# Patient Record
Sex: Female | Born: 1945
Health system: Southern US, Community
[De-identification: ages and names within clinical notes are randomized; demographics above are authoritative.]

## PROBLEM LIST (undated history)

## (undated) DIAGNOSIS — F329 Major depressive disorder, single episode, unspecified: Secondary | ICD-10-CM

## (undated) DIAGNOSIS — R0602 Shortness of breath: Secondary | ICD-10-CM

## (undated) DIAGNOSIS — K259 Gastric ulcer, unspecified as acute or chronic, without hemorrhage or perforation: Secondary | ICD-10-CM

## (undated) DIAGNOSIS — Z9889 Other specified postprocedural states: Secondary | ICD-10-CM

## (undated) DIAGNOSIS — Z8489 Family history of other specified conditions: Secondary | ICD-10-CM

## (undated) DIAGNOSIS — R51 Headache: Secondary | ICD-10-CM

## (undated) DIAGNOSIS — F419 Anxiety disorder, unspecified: Secondary | ICD-10-CM

## (undated) DIAGNOSIS — E7800 Pure hypercholesterolemia, unspecified: Secondary | ICD-10-CM

## (undated) DIAGNOSIS — M1990 Unspecified osteoarthritis, unspecified site: Secondary | ICD-10-CM

## (undated) DIAGNOSIS — J449 Chronic obstructive pulmonary disease, unspecified: Secondary | ICD-10-CM

## (undated) DIAGNOSIS — T4145XA Adverse effect of unspecified anesthetic, initial encounter: Secondary | ICD-10-CM

## (undated) DIAGNOSIS — J189 Pneumonia, unspecified organism: Secondary | ICD-10-CM

## (undated) DIAGNOSIS — B977 Papillomavirus as the cause of diseases classified elsewhere: Secondary | ICD-10-CM

## (undated) DIAGNOSIS — K219 Gastro-esophageal reflux disease without esophagitis: Secondary | ICD-10-CM

## (undated) DIAGNOSIS — T8859XA Other complications of anesthesia, initial encounter: Secondary | ICD-10-CM

## (undated) DIAGNOSIS — I209 Angina pectoris, unspecified: Secondary | ICD-10-CM

## (undated) DIAGNOSIS — K589 Irritable bowel syndrome without diarrhea: Secondary | ICD-10-CM

## (undated) DIAGNOSIS — F32A Depression, unspecified: Secondary | ICD-10-CM

## (undated) DIAGNOSIS — I10 Essential (primary) hypertension: Secondary | ICD-10-CM

## (undated) DIAGNOSIS — R112 Nausea with vomiting, unspecified: Secondary | ICD-10-CM

## (undated) HISTORY — DX: Pure hypercholesterolemia, unspecified: E78.00

## (undated) HISTORY — PX: APPENDECTOMY: SHX54

## (undated) HISTORY — DX: Other specified postprocedural states: Z98.890

## (undated) HISTORY — PX: CARPAL TUNNEL RELEASE: SHX101

## (undated) HISTORY — DX: Depression, unspecified: F32.A

## (undated) HISTORY — PX: BACK SURGERY: SHX140

## (undated) HISTORY — PX: BREAST SURGERY: SHX581

## (undated) HISTORY — DX: Anxiety disorder, unspecified: F41.9

## (undated) HISTORY — DX: Essential (primary) hypertension: I10

## (undated) HISTORY — DX: Major depressive disorder, single episode, unspecified: F32.9

## (undated) HISTORY — DX: Gastric ulcer, unspecified as acute or chronic, without hemorrhage or perforation: K25.9

## (undated) HISTORY — DX: Gastro-esophageal reflux disease without esophagitis: K21.9

## (undated) HISTORY — PX: OTHER SURGICAL HISTORY: SHX169

---

## 1974-10-03 HISTORY — PX: TUBAL LIGATION: SHX77

## 1982-10-03 HISTORY — PX: CHOLECYSTECTOMY: SHX55

## 2001-06-06 ENCOUNTER — Other Ambulatory Visit: Admission: RE | Admit: 2001-06-06 | Discharge: 2001-06-06 | Payer: Self-pay | Admitting: Family Medicine

## 2003-10-03 ENCOUNTER — Emergency Department (HOSPITAL_COMMUNITY): Admission: EM | Admit: 2003-10-03 | Discharge: 2003-10-03 | Payer: Self-pay | Admitting: Emergency Medicine

## 2004-01-07 ENCOUNTER — Ambulatory Visit (HOSPITAL_COMMUNITY): Admission: RE | Admit: 2004-01-07 | Discharge: 2004-01-07 | Payer: Self-pay | Admitting: Family Medicine

## 2005-03-24 ENCOUNTER — Other Ambulatory Visit: Admission: RE | Admit: 2005-03-24 | Discharge: 2005-03-24 | Payer: Self-pay | Admitting: Family Medicine

## 2005-03-30 ENCOUNTER — Ambulatory Visit (HOSPITAL_COMMUNITY): Admission: RE | Admit: 2005-03-30 | Discharge: 2005-03-30 | Payer: Self-pay | Admitting: Family Medicine

## 2005-07-24 ENCOUNTER — Inpatient Hospital Stay (HOSPITAL_COMMUNITY): Admission: EM | Admit: 2005-07-24 | Discharge: 2005-07-28 | Payer: Self-pay | Admitting: Emergency Medicine

## 2005-10-03 DIAGNOSIS — J189 Pneumonia, unspecified organism: Secondary | ICD-10-CM

## 2005-10-03 HISTORY — DX: Pneumonia, unspecified organism: J18.9

## 2006-04-25 ENCOUNTER — Encounter: Admission: RE | Admit: 2006-04-25 | Discharge: 2006-04-25 | Payer: Self-pay | Admitting: Family Medicine

## 2007-10-04 HISTORY — PX: SPINE SURGERY: SHX786

## 2008-01-07 ENCOUNTER — Ambulatory Visit: Payer: Self-pay | Admitting: Gastroenterology

## 2008-01-07 DIAGNOSIS — R1310 Dysphagia, unspecified: Secondary | ICD-10-CM | POA: Insufficient documentation

## 2008-01-21 ENCOUNTER — Encounter: Payer: Self-pay | Admitting: Gastroenterology

## 2008-01-21 ENCOUNTER — Ambulatory Visit: Payer: Self-pay | Admitting: Gastroenterology

## 2008-01-28 ENCOUNTER — Ambulatory Visit: Payer: Self-pay | Admitting: Gastroenterology

## 2008-04-28 ENCOUNTER — Telehealth: Payer: Self-pay | Admitting: Gastroenterology

## 2008-05-05 ENCOUNTER — Ambulatory Visit: Payer: Self-pay | Admitting: Gastroenterology

## 2008-05-15 ENCOUNTER — Ambulatory Visit: Payer: Self-pay | Admitting: Gastroenterology

## 2008-05-15 DIAGNOSIS — Z9889 Other specified postprocedural states: Secondary | ICD-10-CM

## 2008-05-15 HISTORY — DX: Other specified postprocedural states: Z98.890

## 2008-05-30 ENCOUNTER — Ambulatory Visit: Payer: Self-pay | Admitting: Gastroenterology

## 2008-05-30 DIAGNOSIS — Z9889 Other specified postprocedural states: Secondary | ICD-10-CM

## 2008-05-30 HISTORY — DX: Other specified postprocedural states: Z98.890

## 2008-06-25 ENCOUNTER — Ambulatory Visit (HOSPITAL_COMMUNITY): Admission: RE | Admit: 2008-06-25 | Discharge: 2008-06-25 | Payer: Self-pay | Admitting: Neurosurgery

## 2008-07-23 ENCOUNTER — Ambulatory Visit (HOSPITAL_COMMUNITY): Admission: RE | Admit: 2008-07-23 | Discharge: 2008-07-24 | Payer: Self-pay | Admitting: Neurosurgery

## 2008-09-04 ENCOUNTER — Observation Stay (HOSPITAL_COMMUNITY): Admission: AD | Admit: 2008-09-04 | Discharge: 2008-09-05 | Payer: Self-pay | Admitting: Neurosurgery

## 2008-11-03 ENCOUNTER — Ambulatory Visit (HOSPITAL_COMMUNITY): Admission: RE | Admit: 2008-11-03 | Discharge: 2008-11-03 | Payer: Self-pay | Admitting: Neurosurgery

## 2008-12-22 ENCOUNTER — Ambulatory Visit (HOSPITAL_COMMUNITY): Admission: RE | Admit: 2008-12-22 | Discharge: 2008-12-22 | Payer: Self-pay | Admitting: Family Medicine

## 2011-01-17 LAB — CBC
HCT: 41 % (ref 36.0–46.0)
Hemoglobin: 13.6 g/dL (ref 12.0–15.0)
MCHC: 33.3 g/dL (ref 30.0–36.0)
MCV: 83.7 fL (ref 78.0–100.0)
Platelets: 252 10*3/uL (ref 150–400)
RBC: 4.9 MIL/uL (ref 3.87–5.11)
RDW: 14.5 % (ref 11.5–15.5)
WBC: 10 10*3/uL (ref 4.0–10.5)

## 2011-01-17 LAB — BASIC METABOLIC PANEL
BUN: 5 mg/dL — ABNORMAL LOW (ref 6–23)
CO2: 29 mEq/L (ref 19–32)
Calcium: 9.8 mg/dL (ref 8.4–10.5)
Chloride: 102 mEq/L (ref 96–112)
Creatinine, Ser: 0.6 mg/dL (ref 0.4–1.2)
GFR calc Af Amer: >60 mL/min (ref >60)
GFR calc non Af Amer: >60 mL/min (ref >60)
Glucose, Bld: 95 mg/dL (ref 70–99)
Potassium: 4.2 mEq/L (ref 3.5–5.1)
Sodium: 136 mEq/L (ref 135–145)

## 2011-02-01 HISTORY — PX: CERVICAL BIOPSY  W/ LOOP ELECTRODE EXCISION: SUR135

## 2011-02-03 ENCOUNTER — Ambulatory Visit (INDEPENDENT_AMBULATORY_CARE_PROVIDER_SITE_OTHER): Payer: Medicare Other | Admitting: Gynecology

## 2011-02-11 ENCOUNTER — Other Ambulatory Visit: Payer: Self-pay | Admitting: Gynecology

## 2011-02-11 ENCOUNTER — Ambulatory Visit (INDEPENDENT_AMBULATORY_CARE_PROVIDER_SITE_OTHER): Payer: Medicare Other | Admitting: Gynecology

## 2011-02-11 DIAGNOSIS — N871 Moderate cervical dysplasia: Secondary | ICD-10-CM

## 2011-02-15 NOTE — Op Note (Signed)
[name], LANTIS            ACCOUNT NO.:  [id number]   MEDICAL RECORD NO.:  [id number]          PATIENT TYPE:  AMB   LOCATION:  SDS                          FACILITY:  [facility]   PHYSICIAN:  Cristi Loron, M.D.DATE OF BIRTH:  Sep 07, 1946   DATE OF PROCEDURE:  11/03/2008  DATE OF DISCHARGE:  11/03/2008                               OPERATIVE REPORT   BRIEF HISTORY:  The patient is a white female who has suffered from  right hand pain and numbness consistent with carpal tunnel syndrome.  She has had MCVs which confirmed this diagnosis.  She failed medical  management and I discussed various treatment options with her including  a right carpal tunnel release.  The patient weighed the risks, benefits,  and alternatives of the surgery and desired to proceed with the  operation.   PREOPERATIVE DIAGNOSIS:  Bilateral carpal tunnel syndrome.   POSTOPERATIVE DIAGNOSIS:  Bilateral carpal tunnel syndrome.   PROCEDURE:  Right median nerve neurolysis at the wrist (carpal tunnel  release).   SURGEON:  Cristi Loron, MD   ASSISTANT:  None.   ANESTHESIA:  MAC.   ESTIMATED BLOOD LOSS:  Minimal.   SPECIMENS:  None.   DRAINS:  None.   COMPLICATIONS:  None.   DESCRIPTION OF PROCEDURE:  The patient was brought to the operating room  by the Anesthesia team.  MAC anesthesia was induced.  The patient  remained in supine position.  The right upper extremity was then  prepared with Betadine scrub and Betadine solution.  Sterile drapes were  applied.  I then injected the area to be incised with Marcaine solution.  I then made and an incision along the patient's right palmar crease.  I  used a Horticulturist, commercial for exposure exposing the superficial fascia.  I then divided this with a 15-blade scalpel exposing the transverse  carpal ligament.  I divided this again with a 15-blade scalpel and  identified the underlying median nerve.  I then divided the transverse  carpal ligament  both proximally and distally using the Metzenbaum  scissors staying along the palmar aspect of the median nerve.  I got  release of the ligament both proximally and distally and the nerve is  well decompressed.  We obtained hemostasis using bipolar electrocautery.  We then reapproximated the patient's subcutaneous tissue with  interrupted 3-0 Vicryl suture.  The skin with a running of 3-0 nylon  suture.  The wound  was then coated with bacitracin ointment.  A sterile dressing applied.  The drapes were removed and the patient was subsequently transported to  postanesthesia care unit in stable condition.  All sponge, instrument,  and needle counts were correct at the end of this case.      Cristi Loron, M.D.  Electronically Signed     JDJ/MEDQ  D:  11/03/2008  T:  11/04/2008  Job:  [id number]

## 2011-02-15 NOTE — Op Note (Signed)
[name], [name] NO.:  [id number]   MEDICAL RECORD NO.:  [id number]          PATIENT TYPE:  INP   LOCATION:  3036                         FACILITY:  [facility]   PHYSICIAN:  Cristi Loron, M.D.DATE OF BIRTH:  Sep 23, 1946   DATE OF PROCEDURE:  09/04/2008  DATE OF DISCHARGE:                               OPERATIVE REPORT   BRIEF HISTORY:  The patient is a 65 year old white female who has  suffered from back and leg pain consistent with neurogenic claudication.  She was worked up with a lumbar MRI which demonstrated that the patient  had spinal stenosis at L2-3, L3-4 and L4-5.  I discussed the various  treatment options with the patient and her husband including surgery.  She has weighed the risks, benefits and alternatives of surgery and  decided to proceed with a decompressive laminectomy.   PREOPERATIVE DIAGNOSES:  L2-3, L3-4 and L4-5 spinal stenosis, lumbago,  lumbar radiculopathy.   POSTOPERATIVE DIAGNOSES:  L2-3, L3-4 and L4-5 spinal stenosis, lumbago,  lumbar radiculopathy.   PROCEDURE:  Bilateral L2, L3 and L4 laminotomies, foraminotomies to  decompress the bilateral L3, L4 and L5 nerve roots using  microdissection.   SURGEON:  Cristi Loron, MD   ASSISTANT:  Clydene Fake, MD   ANESTHESIA:  General endotracheal.   ESTIMATED BLOOD LOSS:  100 mL.   SPECIMENS:  None.   DRAINS:  None.   COMPLICATIONS:  None.   DESCRIPTION OF PROCEDURE:  The patient was brought to the operating room  by the anesthesia team and general endotracheal anesthesia was induced.  The patient was turned to the prone position on the Wilson frame.  Lumbosacral region was then prepared with Betadine scrub and Betadine  solution.  I have reviewed the patient's preoperative films and noted  that she had stenosis at L2-3, L3-4 and L4-5.  I therefore thought it  best to do a decompressive laminotomies at L2-3, L3-4 and L4-5 and I  announced to the OR team prior to  beginning the operation, i.e. prior to  the incision that I was going to include L4-5 in the surgery as well to  avoid any misconceptions.  I then injected the area to be incised with  Marcaine with epinephrine solution and used the scalpel to make a linear  midline incision over the L2-3, L3-4 and L4-5 interspaces.  I used  electrocautery to perform a bilateral subperiosteal dissection exposing  the spinous process lamina of L2, L3, L4 and L5.  We obtained  intraoperative radiograph to confirm our location.  I inserted the  cerebellar and McCullough retractor for exposure.  I began the  decompression by performing bilateral L2, L3 and L4 laminotomies with a  high-speed drill.  I then brought the operative microscope into the  field and under its magnification and illumination, I completed the  microdissection/decompression.  I used a Kerrison punch to widen the  laminotomies at L2, L3 and L4 and removed the L2-3, L3-4 and L4-5  ligamentum flavum.  I then performed foraminotomies about bilateral L3,  L4 and L5 nerve roots.  I used the angled curettes to  remove some excess  ligamentum flavum from lateral recesses.  I then palpated along the  ventral surface of the thecal sac and along the exit route of the  bilateral L3, L4, and L5 nerve roots and noted that they are well  decompressed.  We inspected the intervertebral disk at L2-3, L3-4 and L4-  5 bilaterally.  The disk was mildly bulging but there was no disk  herniations.  At this point, we were satisfied with decompression.  We  obtained hemostasis using bipolar electrocautery, irrigated the wound  out with bacitracin solution, then removed the retractors and then  reapproximated the patient's thoracolumbar fascia with interrupted #1  Vicryl suture, subcutaneous with interrupted 2-0 Vicryl suture and the  skin with Steri-Strips and benzoin.  The wound was then coated with  bacitracin ointment.  A sterile dressing was applied.  The  drapes were  removed.  The patient was subsequently returned to supine position where  she was extubated by the anesthesia team and transported to the  postanesthesia care unit in stable condition.  All sponge, instrument  and needle counts were correct at the end of this case.      Cristi Loron, M.D.  Electronically Signed     JDJ/MEDQ  D:  09/04/2008  T:  09/05/2008  Job:  [id number]

## 2011-02-15 NOTE — Letter (Signed)
January 07, 2008    Belva Agee, [place]  [address] [place], [place] [address]   RE:  ZAYLEI, MULLANE  MRN:  [id number]  /  DOB:  27-May-1946   Dear Darl Pikes:   Upon your kind referral, I had the pleasure of evaluating your patient  and I am pleased to offer my findings.  I saw Ms. Shrum in the office  today.  Enclosed is a copy of my progress note that details my findings  and recommendations.   Thank you for the opportunity to participate in your patient's care.    Sincerely,      Barbette Hair. Arlyce Dice, MD,FACG  Electronically Signed    RDK/MedQ  DD: 01/07/2008  DT: 01/07/2008  Job #: [id number]

## 2011-02-15 NOTE — Letter (Signed)
January 07, 2008    Tilford Pillar   RE:  [name], Sharon  MRN:  [id number]  /  DOB:  Jun 06, 1946   Dear Ms. Odle:   It is my pleasure to have treated you recently as a new patient in my  office.  I appreciate your confidence and the opportunity to participate  in your care.   Since I do have a busy inpatient endoscopy schedule and office schedule,  my office hours vary weekly.  I am, however, available for emergency  calls every day through my office.  If I cannot promptly meet an urgent  office appointment, another one of our gastroenterologists will be able  to assist you.   My well-trained staff are prepared to help you at all times.  For  emergencies after office hours, a physician from our gastroenterology  section is always available through my 24-hour answering service.   While you are under my care, I encourage discussion of your questions  and concerns, and I will be happy to return your calls as soon as I am  available.   Once again, I welcome you as a new patient and I look forward to a happy  and healthy relationship.    Sincerely,      Barbette Hair. Arlyce Dice, MD,FACG  Electronically Signed   RDK/MedQ  DD: 01/07/2008  DT: 01/07/2008  Job #: [id number]

## 2011-02-15 NOTE — Op Note (Signed)
[name], [name]            ACCOUNT NO.:  [id number]   MEDICAL RECORD NO.:  [id number]          PATIENT TYPE:  OIB   LOCATION:  3526                         FACILITY:  [facility]   PHYSICIAN:  Cristi Loron, M.D.DATE OF BIRTH:  [birth year]   DATE OF PROCEDURE:  07/23/2008  DATE OF DISCHARGE:                               OPERATIVE REPORT   BRIEF HISTORY:  The patient is a 65 year old white female who suffered  from neck and arm pain consistent with a C6 radiculopathy.  She failed  medical management and was worked up with a cervical MRI, which  demonstrated she had C5-6 disk degeneration, spondylosis, and stenosis,  etc.  I discussed the various treatment options with the patient and her  husband including surgery.  The patient has weighed the risks, benefits,  benefits, and alternatives of surgery, and decided to proceed with C5-6  anterior cervical diskectomy fusion and plating.   PREOPERATIVE DIAGNOSES:  C5-6 degenerative disk disease, spondylosis,  stenosis, cervical radiculitis/myelopathy, and cervicalgia.   POSTOPERATIVE DIAGNOSIS:  C5-6 degenerative disk disease, spondylosis,  stenosis, cervical radiculitis/myelopathy, and cervicalgia.   PROCEDURE:  C5-6 extensive anterior cervical diskectomy/decompression;  C5-6 anterior interbody arthrodesis with local morselized autograft bone  and Actifuse bone graft extender; insertion of C5-6 interbody prosthesis  (Alphatec PEEK interbody prosthesis); C5-6 anterior cervical plating  (Codman Slim-Loc titanium plate and screws).   SURGEON:  Cristi Loron, MD   ASSISTANT:  Danae Orleans. Venetia Maxon, MD   ANESTHESIA:  General endotracheal.   ESTIMATED BLOOD LOSS:  50 mL.   SPECIMENS:  None.   DRAINS:  None.   COMPLICATIONS:  None.   DESCRIPTION OF PROCEDURE:  The patient was brought to the operating room  by anesthesia team.  General endotracheal anesthesia was induced.  The  patient remained in supine position.  A roll was  placed under her  shoulders and placed her neck in slight extension.  The anterior  cervical region was then prepared with Betadine scrub and Betadine  solution.  Sterile drapes were applied.  I then injected the area to be  incised with Marcaine with epinephrine solution.  I used a scalpel to  make a transverse incision in the patient's left anterior neck.  I used  a Metzenbaum scissors to divide the platysma muscle and then to dissect  medial to sternocleidomastoid muscle, jugular vein, and carotid artery.  I carefully dissected down towards the anterior cervical spine  identifying the esophagus and retract it medially.  We then used skin  swabs to clear soft tissue from the anterior cervical spine, and then we  inserted a bent spinal needle into the upper exposed intervertebral  space.  We obtained the intraoperative radiograph to confirm our  location.   We then used electrocautery to detach the medial border of the longus  colli muscle bilaterally from the C5-6 intervertebral disk space.  We  then inserted  the Caspar self-retaining retractor underneath the longus  colli muscle bilaterally to provide exposure.  We began the  decompression by incising C5-6 intervertebral disk with a 15-blade  scalpel.  We performed a partial intervertebral diskectomy  using  pituitary forceps and Carlens curettes.  We then inserted traction  screws to C5 and C6 and distracted the interspace.  We used a high-speed  drill to decorticate the vertebral endplates at C5-6, drilled away the  remainder of C5-6 intervertebral disk, thin out the posterior  longitudinal ligament, and drilled away some posterior spondylosis.  We  then incised the thinned out ligament with arachnoid knife and then  removed with a Kerrison punch undercutting the vertebral endplates  decompressing the thecal sac.  We then performed foraminotomies about of  about the bilateral C6 nerve root, completing the decompression.   We now  turned attention to the arthrodesis.  We used the trial spacers  and determined to use a 5-mm medium PEEK interbody prosthesis.  We  prefilled this prosthesis with combination of local autograft bone.  We  obtained total decompression as well as Actifuse bone graft extender.  We inserted this prosthesis into the distracted C5-6 interspace and then  removed the distraction screws.  There was a good snug fit of the  prosthesis.   We now turned attention to the anterior spinal instrumentation.  We used  a high-speed drill to drill away some ventral spondylosis from the  vertebral endplates at C5-6, so that the plate lay down flat.  We then  the appropriate-length Codman Slim-Loc anterior cervical plate and laid  it along the anterior aspect of the vertebral bodies at C5-C6.  We then  drilled two 12 mm holes at C5-C6 and then secured the plate to the  vertebral bodies by placing two 12-mm self-tapping screws at C5-C6.  We  then obtained intraoperative radiograph that demonstrating good position  of the plate, screws, and interbody prosthesis at C5-6.  We, therefore,  secured these screws and plate by locking each cam.   We then obtained hemostasis using bipolar electrocautery.  We irrigated  the wound out with bacitracin solution.  We then removed the retractor  and then we inspected esophagus for any damage; there was none apparent.  We then reapproximated the patient's platysma muscle with interrupted 3-  0 Vicryl suture, the subcutaneous tissue with interrupted 3-0 Vicryl  suture, and the skin with Steri-Strips and Benzoin.  The wound was then  coated with bacitracin ointment.  A sterile dressing was applied.  The  drapes were removed, and the patient was subsequently extubated by  anesthesia team and transported to the post anesthesia care unit in  stable condition.  All sponge, instrument, and needle counts were  correct at the end of this case.      Cristi Loron, M.D.   Electronically Signed     JDJ/MEDQ  D:  07/23/2008  T:  07/24/2008  Job:  [id number]

## 2011-02-15 NOTE — Assessment & Plan Note (Signed)
[facility]                         GASTROENTEROLOGY OFFICE NOTE   NAME:Routt, [name]                   MRN:          [id number]  DATE:01/07/2008                            DOB:          07/15/1946    PROBLEM:  Dysphagia.   The patient is a pleasant 65 year old white female referred through the  courtesy of Belva Agee, P.A. for evaluation.  For the last 4 months she  has been complaining of dysphagia to solids and liquids.  Her symptoms  are actually worse with liquids.  She denies pyrosis or odynophagia.  She does have intermittent pain the left and right upper quadrants and  in the chest.  With this pain, the discomfort may radiate to her back.  Weight has been stable.   PAST MEDICAL HISTORY:  Pertinent for hypertension and depression.  She  is status post cholecystectomy, appendectomy.   FAMILY HISTORY:  Pertinent for a brother with heart disease and mother  and brother with diabetes.   MEDICATIONS:  1. Hydrochlorothiazide.  2. Paroxetine.  3. Xanax.  4. Vitamin D.   ALLERGIES:  CODEINE.   SOCIAL HISTORY:  She smokes 1 to 1-1/2 packs a day.  She drank a pint of  alcohol about every other day, but quit in August 2008.  She is married  and works as a Scientist, product/process development.   REVIEW OF SYSTEMS:  Pertinent for joint pains and muscle cramps.   PHYSICAL EXAMINATION:  VITAL SIGNS:  Pulse 78, blood pressure 148/74,  weight 185.  HEENT: EOMI.  PERRLA.  Sclerae are anicteric.  Conjunctivae are pink.  NECK:  Supple without thyromegaly, adenopathy or carotid bruits.  CHEST:  Clear to auscultation and percussion without adventitious  sounds.  CARDIAC:  Regular rhythm; normal S1 S2.  There are no murmurs, gallops  or rubs.  ABDOMEN:  Bowel sounds are normoactive.  Abdomen is soft, nontender and  nondistended.  There are no abdominal masses, tenderness, splenic  enlargement or hepatomegaly.  EXTREMITIES:  Full range of motion.  No cyanosis,  clubbing or edema.  RECTAL:  Deferred.   IMPRESSION:  Dysphagia.  She likely has an esophageal stricture.  Peptic  stricture is certainly likely though she has risk factors for esophageal  cancer.   RECOMMENDATION:  1. Upper endoscopy.  2. Begin Aciphex 20 mg a day.     Barbette Hair. Arlyce Dice, MD,FACG  Electronically Signed    RDK/MedQ  DD: 01/07/2008  DT: 01/07/2008  Job #: [id number]   cc:   [facility] Belva Agee, [place]

## 2011-02-18 NOTE — H&P (Signed)
[name], [name] NO.:  [id number]   MEDICAL RECORD NO.:  [id number]          PATIENT TYPE:  EMS   LOCATION:  ED                            FACILITY:  [facility]   PHYSICIAN:  Michaelyn Barter, M.D. DATE OF BIRTH:  1946/03/28   DATE OF ADMISSION:  07/24/2005  DATE OF DISCHARGE:  LH                                HISTORY & PHYSICAL   PRIMARY CARE PHYSICIAN:  Magnus Sinning. Rice, M.D.   CHIEF COMPLAINT:  Shortness of breath.   HISTORY OF PRESENT ILLNESS:  [name] is a 65 year old female with a  past medical history of hypertension and depression who states that this  past Thursday she began to develop a cough.  She states that she bought some  over-the-counter cough medications and initially, it appeared to relieve her  symptoms.  However, since Thursday, she has developed tightness in her chest  which has progressed to the point that last night, she became short of  breath.  Yesterday, she also developed generalized body aches and pain and  states that she has been coughing a significant amount of time since her  symptoms initially started.  She complains of some subjective fevers, some  nausea, but denies emesis.  The cough is productive of scant amount of  phlegm and states that this morning, her voice decreased.   PAST MEDICAL HISTORY:  1.  Hypertension.  2.  Depression.   PAST SURGICAL HISTORY:  1.  Cholecystectomy.  2.  Appendectomy.  3.  Tumor removed from the right shoulder.  4.  Left breast biopsy.   ALLERGIES:  CODEINE causes her chest to hurt and emesis occurs.   HOME MEDICATIONS:  1.  Goody Powder on a p.r.n. basis.  2.  Hydrochlorothiazide 25 mg daily.  3.  Paxil 20 mg daily.   SOCIAL HISTORY:  The patient smokes one pack of cigarettes per day.  She  started at the age of 85.  Alcohol she drinks occasionally.   FAMILY HISTORY:  Mother had a history of emphysema, died of  heart disease.  Father:  History of hypertension and CHF.   REVIEW OF SYSTEMS:  As per HPI;  otherwise, all other systems are negative.   PHYSICAL EXAMINATION:  GENERAL:  The patient is awake.  She is cooperative.  She coughs numerous times during the examination. She shows no other obvious  signs of distress.  Her breathing at this particular time does not appear to  be labored;  however, she did receive a breathing treatment in addition to  other treatments prior to me seeing her.  VITAL SIGNS:  Temperature 99.2, blood pressure 136/56, heart rate 101,  respirations 24, O2 saturation 88% initially when the patient presented to  the emergency room.  HEENT:  Anicteric.  Extraocular movements are intact.  Pupils are equally  reactive to light.  Dentures are present.  There is no oral thrush.  No  exudates are present.  NECK: Supple, no lymphadenopathy. Thyroid is not palpable.  No JVD is  appreciated.  CARDIAC:  S1 and S2 is present, regular rate and rhythm.  No murmurs,  no  gallops, no rubs are auscultated.  RESPIRATORY:  Loud, coarse expiratory wheezes/rhonchi are heard.  ABDOMEN:  Soft, nontender, nondistended.  Bowel sounds are present.  EXTREMITIES:  No leg edema.  NEUROLOGIC:  The patient is alert and oriented x3.  Cranial nerves II-XII  are intact.  MUSCULOSKELETAL:  5/5 upper and lower extremity strength.   DATA:  ABG:  pH 7.510, PCO2 36, PO2 49.  Hemoglobin 13.6, hematocrit 40.  Sodium 132, potassium 3.1, chloride 97, CO2 134, BUN 5, creatinine 0.9,  glucose 134.  Chest x-ray:  Mild cardiomegaly, chronic bronchitic changes,  rounded density over the right apex.  CT of the chest:  Diffuse tree-in-bud  opacity in the right upper lung related to bronchopneumonia and/or atypical  infection.  Nodule at the right cardiophrenic angle is the fat pad.   ASSESSMENT/PLAN:  1.  Community acquired pneumonia.  Will start the patient empirically on IV      antibiotics consisting of ceftriaxone and azithromycin.  Will check      sputum for Gram  stain, culture and sensitivity.  Will also provide cough      suppressant and incentive spirometer for the patient.  2.  Respiratory distress.  This is most likely secondary to the community      acquired pneumonia that the patient is currently presenting with. Will      continue supplemental oxygen and nebulized breathing treatments.  Will      also continue steroids that were initiated in the emergency room.  3.  Hypertension.  This is currently stable.  Will resume the patient's      previously prescribed medication of hydrochlorothiazide.  4.  Depression.  Will resume the patient's previously prescribed medication      of Paxil.  5.  Gastrointestinal prophylaxis.  Will provide Protonix 40 mg daily.  6.  Deep venous thrombosis prophylaxis.  Will provide Lovenox 40 mg      subcutaneously daily.      Michaelyn Barter, M.D.  Electronically Signed     OR/MEDQ  D:  07/24/2005  T:  07/24/2005  Job:  [id number]   cc:   Magnus Sinning. Rice, M.D.  Fax: [phone]

## 2011-02-18 NOTE — Discharge Summary (Signed)
[name], [name]            ACCOUNT NO.:  [id number]   MEDICAL RECORD NO.:  [id number]          PATIENT TYPE:  INP   LOCATION:  A335                          FACILITY:  [facility]   PHYSICIAN:  Calvert Cantor, M.D.     DATE OF BIRTH:  30-Oct-1945   DATE OF ADMISSION:  07/24/2005  DATE OF DISCHARGE:  10/26/2006LH                                 DISCHARGE SUMMARY   PRIMARY CARE PHYSICIAN:  Magnus Sinning. Rice, M.D.   DISCHARGE DIAGNOSES:  1.  Acute right upper lobe pneumonia.  2.  Chronic obstructive pulmonary disease exacerbation.  3.  History of depression.  4.  Hypertension.   DISCHARGE MEDICATIONS:  1.  Prednisone taper.  2.  Combivent inhaler.  3.  Zithromax x2 more days.  4.  Hydrochlorothiazide 25 mg daily.  5.  Paxil 20 mg daily.   HOSPITAL COURSE:  This is a 65 year old, white female who was admitted with  cough and shortness of breath.  Chest x-ray showed a right upper lobe  infiltrate.  In addition, the patient had some tightness in her chest and  wheezing.  She was treated with nebulizers, IV steroids and IV antibiotics  and improved slowly.  The patient had also complained of fevers and some  night sweats.  Due to the location of her pneumonia, it was necessary to  rule out TB and PPD was placed.  After 72 hours, the PPD is negative.  The  patient has improved on IV antibiotics and steroids.  She is therefore being  discharged home today in stable condition.   Follow up chest x-ray done on October 25, shows interval resolution of the  small nodular density in the right upper lobe.  In addition, there is an  opacity in the right cardiophrenic angle which was seen also on her chest x-  ray on admission which is believed to represent a juxtacardiac fat pad.   CT of her chest on admission showed diffuse tree and bud opacity in the  right upper lobe with a more prominent nodular density in the posterior  right apex.  Characteristics are compatible with bronchopneumonia  or  atypical infection.  Nodular density in the right cardiophrenic angle  believed to represent a juxtacardiac fat pad.  Atelectasis or scar in the  lingula.   DISCHARGE LABORATORY DATA AND X-RAY FINDINGS:  The patient's WBC is 15.2.  She has no bands.  Hemoglobin 11.5, hematocrit 33.8, platelets 354.  Sodium  139, potassium 3.2, this will be replaced before discharge, chloride 101,  bicarb 30, glucose 89, BUN 6, creatinine 0.5, calcium 8.7.   Blood cultures done on admission are negative.  Legionella culture done on  admission is also negative.  A respiratory culture is showing moderate wbc's  and normal oropharyngeal flora.  Fungus culture done on admission is also  negative thus far.   Disposition:  She is complaining of a small amount of productive cough.  She  is not dyspenic and able to ambulate without complaints.  She is advised to  increase activity slowly.      Calvert Cantor, M.D.  Electronically Signed  SR/MEDQ  D:  07/28/2005  T:  07/28/2005  Job:  [id number]   cc:   Magnus Sinning. Rice, M.D.  Fax: [phone]

## 2011-06-30 ENCOUNTER — Encounter: Payer: Self-pay | Admitting: Gastroenterology

## 2011-06-30 ENCOUNTER — Ambulatory Visit (INDEPENDENT_AMBULATORY_CARE_PROVIDER_SITE_OTHER): Payer: Medicare Other | Admitting: Gastroenterology

## 2011-06-30 VITALS — BP 127/78 | HR 63 | Temp 97.6°F | Ht 65.0 in | Wt 186.6 lb

## 2011-06-30 DIAGNOSIS — R1013 Epigastric pain: Secondary | ICD-10-CM | POA: Insufficient documentation

## 2011-06-30 DIAGNOSIS — R1310 Dysphagia, unspecified: Secondary | ICD-10-CM | POA: Insufficient documentation

## 2011-06-30 DIAGNOSIS — R197 Diarrhea, unspecified: Secondary | ICD-10-CM

## 2011-06-30 MED ORDER — RABEPRAZOLE SODIUM 20 MG PO TBEC: 20.0000 mg | DELAYED_RELEASE_TABLET | Freq: Every day | ORAL | Status: DC

## 2011-06-30 MED ORDER — DICYCLOMINE HCL 10 MG PO CAPS: 10.0000 mg | ORAL_CAPSULE | Freq: Four times a day (QID) | ORAL | Status: DC

## 2011-06-30 NOTE — Assessment & Plan Note (Signed)
See "epigastric pain"

## 2011-06-30 NOTE — Assessment & Plan Note (Signed)
Chronic loose stools for at least one year, preceded by lower abdominal cramping. Relieved thereafter. No warning signs such as weight loss, rectal bleeding. She has had a colonoscopy in 2008 or 2009; we have requested these results. Symptoms seem to correlate with IBS-D. Will treat symptomatically, enforce dietary modification, f/u in 3 mos.  Stool studies (Cdiff PCR, Giardia, lactoferrin, Culture) High fiber, low-fat diet Bentyl 10 mg qac and qhs prn 3 mos f/u

## 2011-06-30 NOTE — Assessment & Plan Note (Addendum)
65 year old female with year-long history of epigastric "burning" in the setting of daily Goody's powder (2-3 day) use. Also takes Mobic chronically. She also notes severe reflux, nocturnal reflux, and dysphagia intermittently. She has had several dilations in the past by Dr. Arlyce Dice; these records have been requested. No melena noted. She will need further evaluation in the near future with an upper endoscopy and dilation. She has a history of ETOH abuse but has abstained for 5 years; she also has multiple medications that could possibly cause an issue with sedation. Therefore, we will proceed with this procedure with Propofol in the OR secondary to likely difficult sedation.   Proceed with upper endoscopy/dilation in the near future with Dr. Jena Gauss. The risks, benefits, and alternatives have been discussed in detail with patient. They have stated understanding and desire to proceed.  Stop Prilosec.  STOP ALL NSAIDS, ASPIRIN POWDERS Switch to Aciphex daily, #14 samples provided  ADDENDUM 10/22: records received, recorded in History as well as HPI. See above.

## 2011-06-30 NOTE — Patient Instructions (Signed)
Please complete stool samples and return to the lab. We will call you with the results.  Stop Prilosec, start Aciphex daily each morning.   STOP BC/Goody's powders. Avoid Mobic if possible.  You may start taking Bentyl before meals and at bedtime to help with belly cramping.  Review the high fiber, low fat diets. This may help with the diarrhea you are having.   Finally, we have set you up for an endoscopy with Dr. Jena Gauss in the near future.  We will see you back in 3 months.

## 2011-06-30 NOTE — Progress Notes (Addendum)
Primary Care Physician:  Bennie Pierini, FNP, FNP Primary Gastroenterologist:  Dr. Jena Gauss   Chief Complaint  Patient presents with  . Diarrhea  . Nausea  . Emesis  . Gastrophageal Reflux    HPI:   [name] is a pleasant 65 year old female who presents today due to chronic nausea, reflux, dysphagia, epigastric pain, and loose stools. She has a history of several dilations in the past by Dr. Arlyce Dice, but I do not have these reports at this moment. Last one was done in 2008/2009 per her report. Last colonoscopy 2008 or 2009.  She reports intermittent nausea and severe reflux for close to one year. She drinks Dr. Reino Kent to get relief and burp. She has belching symptoms. She is taking Prilosec daily without relief. She has been on Mobic for 2 years, and she consumes 2-3 Goody's powders daily. She has noted epigastric burning, unrelated to eating/drinking for a year as well. She notes dysphagia with mostly liquids, food slightly easier for several years. She has never had much improvement with dilations in the past.   She also reports a year long history of intermittent, sharp, lower abdominal pain prior to loose stools. Stools upwards of 4-6 times per day. Feels better afterward. No melena or brbpr. No fever or chills. Denies weight loss; however, she has decreased intake because of fear of loose stools. Most diarrhea postprandial.     ADDENDUM: Records received from Dr. Arlyce Dice. Aug 2009 colonoscopy: findings normal to cecum, scattered sigmoid diverticula Aug 2009 EGD: distal esophageal stricture, s/p Maloney dilation, records also state duodenal stricture, stating "unable to pass 10mm scope through pylorus".   Past Medical History  Diagnosis Date  . GERD (gastroesophageal reflux disease)   . HTN (hypertension)   . Hypercholesterolemia   . Depression   . Anxiety   . S/P colonoscopy 05/30/2008    Dr. Arlyce Dice: (records reviewed by AS, NP) findings normal to cecum, scattered diverticula  in sigmoid colon   . S/P endoscopy 05/15/2008    Dr. Arlyce Dice: (records reviewed by AS,NP): [facility] dilator for stricture distal esophagus, also noted duodenal stricture, op notes state unable to pass 10mm scope through pylorus  . Hypercholesteremia   . IBS (irritable bowel syndrome)   . Complication of anesthesia   . PONV (postoperative nausea and vomiting)   . HPV in female     Past Surgical History  Procedure Date  . Cholecystectomy   . Appendectomy   . Tumor removed from arm   . Tubal ligation   . Carpal tunnel release     right hand  . Back surgery     X 2    Current Outpatient Prescriptions  Medication Sig Dispense Refill  . ALPRAZolam (XANAX) 0.5 MG tablet Take 0.5 mg by mouth at bedtime as needed.        . calcium carbonate (OS-CAL) 600 MG TABS Take 600 mg by mouth 2 (two) times daily with a meal.        . Cholecalciferol (VITAMIN D3) 1000 UNITS CAPS Take 1,000 mg by mouth 3 (three) times daily.        . fish oil-omega-3 fatty acids 1000 MG capsule Take 2 g by mouth daily.        Marland Kitchen HYDROcodone-acetaminophen (NORCO) 10-325 MG per tablet Take 1 tablet by mouth every 6 (six) hours as needed.        Marland Kitchen lisinopril-hydrochlorothiazide (PRINZIDE,ZESTORETIC) 10-12.5 MG per tablet Take 1 tablet by mouth daily.        Marland Kitchen  meloxicam (MOBIC) 15 MG tablet Take 15 mg by mouth daily.        Marland Kitchen omeprazole (PRILOSEC) 20 MG capsule Take 20 mg by mouth daily.        Marland Kitchen PARoxetine (PAXIL) 20 MG tablet Take 20 mg by mouth every morning.        . Potassium 99 MG TABS Take 99 mg by mouth.        . pyridOXINE (VITAMIN B-6) 100 MG tablet Take 100 mg by mouth daily.        . simvastatin (ZOCOR) 40 MG tablet Take 40 mg by mouth at bedtime.        . vitamin C (ASCORBIC ACID) 500 MG tablet Take 500 mg by mouth daily.        . vitamin E (VITAMIN E) 400 UNIT capsule Take 400 Units by mouth daily.                      Allergies as of 06/30/2011 - Review Complete 06/30/2011  Allergen Reaction Noted  .  Codeine  05/05/2008    Family History  Problem Relation Age of Onset  . Colon cancer Neg Hx     History   Social History  . Marital Status: Married    Spouse Name: N/A    Number of Children: 5  . Years of Education: N/A   Occupational History  . disability    Social History Main Topics  . Smoking status: Current Everyday Smoker -- 1.0 packs/day for 50 years    Types: Cigarettes  . Smokeless tobacco: Not on file   Comment: since teenager  . Alcohol Use: No     hx of ETOH abuse in past, none in 5 years  . Drug Use: No  . Sexually Active: Not on file   Other Topics Concern  . Not on file   Social History Narrative  . No narrative on file    Review of Systems: Gen: Denies any fever, chills, fatigue, weight loss, + lack of appetite.  CV: Denies chest pain, heart palpitations, peripheral edema, syncope.  Resp: Denies shortness of breath at rest or with exertion. Denies wheezing or cough.  GI: Denies odynophagia. + dysphagia. Denies jaundice, hematemesis, fecal incontinence. GU : Denies urinary burning, urinary frequency, urinary hesitancy MS: Denies joint pain, muscle weakness, cramps, or limitation of movement.  Derm: Denies rash, itching, dry skin Psych: Denies depression, anxiety, memory loss, and confusion Heme: Denies bruising, bleeding, and enlarged lymph nodes.  Physical Exam: BP 127/78  Pulse 63  Temp(Src) 97.6 F (36.4 C) (Temporal)  Ht 5\' 5"  (1.651 m)  Wt 186 lb 9.6 oz (84.641 kg)  BMI 31.05 kg/m2 General:   Alert and oriented. Pleasant and cooperative. Well-nourished and well-developed.  Head:  Normocephalic and atraumatic. Eyes:  Without icterus, sclera clear and conjunctiva pink.  Ears:  Normal auditory acuity. Nose:  No deformity, discharge,  or lesions. Mouth:  No deformity or lesions, oral mucosa pink.  Neck:  Supple, without mass or thyromegaly. Lungs:  Clear to auscultation bilaterally. No wheezes, rales, or rhonchi. No distress.  Heart:  S1,  S2 present without murmurs appreciated.  Abdomen:  +BS, soft, mildly tender to palpation epigastric region;  non-distended. No HSM noted. No guarding or rebound. No masses appreciated.  Rectal:  Deferred  Msk:  Symmetrical without gross deformities. Normal posture. Extremities:  Without clubbing or edema. Neurologic:  Alert and  oriented x4;  grossly normal neurologically. Skin:  Intact without significant lesions or rashes. Cervical Nodes:  No significant cervical adenopathy. Psych:  Alert and cooperative. Normal mood and affect.

## 2011-06-30 NOTE — Progress Notes (Signed)
Cc to PCP 

## 2011-07-04 ENCOUNTER — Other Ambulatory Visit: Payer: Self-pay | Admitting: Gastroenterology

## 2011-07-05 LAB — CBC
HCT: 41.6
Platelets: 214
WBC: 8.2

## 2011-07-06 LAB — GIARDIA ANTIGEN: Giardia Screen (EIA): NEGATIVE

## 2011-07-06 LAB — FECAL LACTOFERRIN, QUANT: Lactoferrin: POSITIVE

## 2011-07-07 NOTE — Progress Notes (Signed)
Quick Note:  Negative stool studies. HOw is pt doing on Bentyl? ______

## 2011-07-08 LAB — CBC
MCHC: 33.4 g/dL (ref 30.0–36.0)
MCV: 86.6 fL (ref 78.0–100.0)
Platelets: 221 10*3/uL (ref 150–400)
RDW: 14.1 % (ref 11.5–15.5)

## 2011-07-08 LAB — BASIC METABOLIC PANEL
BUN: 7 mg/dL (ref 6–23)
CO2: 27 mEq/L (ref 19–32)
Chloride: 102 mEq/L (ref 96–112)
Creatinine, Ser: 0.52 mg/dL (ref 0.4–1.2)

## 2011-07-09 LAB — STOOL CULTURE

## 2011-07-13 NOTE — Progress Notes (Signed)
Quick Note:  Positive lactoferrin. How is diarrhea? Should be on Bentyl. She is scheduled for an EGD in the near future. I know she is still nauseated some, but we will be doing EGD. ______

## 2011-07-14 NOTE — Progress Notes (Signed)
Quick Note:  Great. Can follow=up after EGD. ______

## 2011-07-15 ENCOUNTER — Telehealth: Payer: Self-pay | Admitting: Internal Medicine

## 2011-07-15 NOTE — Telephone Encounter (Signed)
Tried to call pt- NA 

## 2011-07-15 NOTE — Telephone Encounter (Signed)
Pt called to speak with AS, but I told her AS was not in the office today. She is having problems with her meds making her sick and would like a return call from the nurse. [phone]

## 2011-07-18 ENCOUNTER — Telehealth: Payer: Self-pay | Admitting: Internal Medicine

## 2011-07-18 NOTE — Telephone Encounter (Signed)
Bentyl. It is causing nausea.

## 2011-07-18 NOTE — Telephone Encounter (Signed)
Can we find out which medication she is referring to? Maybe Bentyl? What does she mean by sick?

## 2011-07-18 NOTE — Telephone Encounter (Signed)
JUST TAKING ACID REFLUX MEDICATION SHE STOPED THE OTHER IT WAS MAKING HER SICK

## 2011-07-18 NOTE — Telephone Encounter (Signed)
Forwarded to AS

## 2011-07-19 NOTE — Telephone Encounter (Signed)
Will f/u outpatient. Continue to abstain from Bentyl.

## 2011-07-21 ENCOUNTER — Other Ambulatory Visit: Payer: Self-pay

## 2011-07-21 ENCOUNTER — Encounter (HOSPITAL_COMMUNITY)
Admission: RE | Admit: 2011-07-21 | Discharge: 2011-07-21 | Disposition: A | Discharge status: Home / Self Care | Payer: Medicare Other | Source: Ambulatory Visit | Attending: Internal Medicine | Admitting: Internal Medicine

## 2011-07-21 ENCOUNTER — Encounter (HOSPITAL_COMMUNITY): Payer: Self-pay

## 2011-07-21 HISTORY — DX: Pure hypercholesterolemia, unspecified: E78.00

## 2011-07-21 HISTORY — DX: Adverse effect of unspecified anesthetic, initial encounter: T41.45XA

## 2011-07-21 HISTORY — DX: Other complications of anesthesia, initial encounter: T88.59XA

## 2011-07-21 HISTORY — DX: Other specified postprocedural states: Z98.890

## 2011-07-21 HISTORY — DX: Nausea with vomiting, unspecified: R11.2

## 2011-07-21 HISTORY — DX: Papillomavirus as the cause of diseases classified elsewhere: B97.7

## 2011-07-21 HISTORY — DX: Irritable bowel syndrome, unspecified: K58.9

## 2011-07-21 LAB — CBC
Platelets: 206 10*3/uL (ref 150–400)
RDW: 13.5 % (ref 11.5–15.5)
WBC: 8.3 10*3/uL (ref 4.0–10.5)

## 2011-07-21 LAB — BASIC METABOLIC PANEL
Chloride: 96 mEq/L (ref 96–112)
Creatinine, Ser: 0.56 mg/dL (ref 0.50–1.10)
GFR calc Af Amer: >90 mL/min (ref >90)
Potassium: 4.1 mEq/L (ref 3.5–5.1)
Sodium: 135 mEq/L (ref 135–145)

## 2011-07-21 NOTE — Patient Instructions (Addendum)
20 [name]  07/21/2011   Your procedure is scheduled on:  07/28/11  Report to [facility] at 08:45 AM.  Call this number if you have problems the morning of surgery: [phone]   Remember:   Do not eat food:After Midnight.  Do not drink clear liquids: After Midnight.  Take these medicines the morning of surgery with A SIP OF WATER: xanax, norco, paxil, aciphex, benadryl, lisinopril, mobic, prilosec   Do not wear jewelry, make-up or nail polish.  Do not wear lotions, powders, or perfumes. You may wear deodorant.  Do not shave 48 hours prior to surgery.  Do not bring valuables to the hospital.  Contacts, dentures or bridgework may not be worn into surgery.  Leave suitcase in the car. After surgery it may be brought to your room.  For patients admitted to the hospital, checkout time is 11:00 AM the day of discharge.   Patients discharged the day of surgery will not be allowed to drive home.  Name and phone number of your driver: driver  Special Instructions: N/A   Please read over the following fact sheets that you were given: Anesthesia Post-op Instructions   Monitored Anesthesia Care (MAC)  MAC stands for monitored anesthesia care. MAC usually means a tube is not put in your trachea (windpipe). MAC may also be called moderate sedation. MAC usually involves giving intravenous anesthetic drugs, oxygen, watching vital signs and standard patient monitoring procedures similar to those used during a general anesthetic. MAC can be done without going to the operating room. MAC is typically used for small procedures that cannot be done with only local anesthesia. MAC usually means lower doses of anesthetic drugs. The recovery period tends to be shorter. The drugs used cause a lower level of awareness. This means you are partially awake and your reflexes are intact. You may hear what is being said and feel some pressure, but should not feel pain. The drugs used may affect your ability to  remember the procedure. If you have depressed consciousness and lose some protective reflexes, this is called deep sedation. If you become unconscious and fall completely asleep, this is general anesthesia. In both deep sedation and general anesthesia, the caregivers must make sure that your airway remains open. During MAC, the sedation-trained caregivers will:  Give medications which may include:   Sedatives.   Analgesics.   Hypnotics.   Other medications which are needed to keep you comfortable, safe and secure.   Give local anesthetic to numb the procedural site.   Monitor your level of consciousness.   Monitor your blood pressure.   Monitor your heart rate and rhythm.   Monitor your respirations and oxygen levels.   Monitor your airway.   Monitor your level of pain.   Evaluate and treat problems which may occur.  Document Released: 06/15/2005 Document Revised: 06/01/2011 Document Reviewed: 08/18/2009 [facility] Patient Information 2012 [place], [place].

## 2011-07-25 ENCOUNTER — Encounter: Payer: Self-pay | Admitting: Gastroenterology

## 2011-07-28 ENCOUNTER — Ambulatory Visit (HOSPITAL_COMMUNITY): Payer: Medicare Other | Admitting: Anesthesiology

## 2011-07-28 ENCOUNTER — Other Ambulatory Visit: Payer: Self-pay | Admitting: Internal Medicine

## 2011-07-28 ENCOUNTER — Encounter (HOSPITAL_COMMUNITY): Payer: Self-pay | Admitting: Anesthesiology

## 2011-07-28 ENCOUNTER — Encounter (HOSPITAL_COMMUNITY): Payer: Self-pay | Admitting: *Deleted

## 2011-07-28 ENCOUNTER — Ambulatory Visit (HOSPITAL_COMMUNITY)
Admission: RE | Admit: 2011-07-28 | Discharge: 2011-07-28 | Disposition: A | Discharge status: Home / Self Care | Payer: Medicare Other | Source: Ambulatory Visit | Attending: Internal Medicine | Admitting: Internal Medicine

## 2011-07-28 ENCOUNTER — Encounter (HOSPITAL_COMMUNITY)
Admission: RE | Disposition: A | Discharge status: Home / Self Care | Payer: Self-pay | Source: Ambulatory Visit | Attending: Internal Medicine

## 2011-07-28 DIAGNOSIS — K449 Diaphragmatic hernia without obstruction or gangrene: Secondary | ICD-10-CM

## 2011-07-28 DIAGNOSIS — R1310 Dysphagia, unspecified: Secondary | ICD-10-CM | POA: Insufficient documentation

## 2011-07-28 DIAGNOSIS — K222 Esophageal obstruction: Secondary | ICD-10-CM | POA: Insufficient documentation

## 2011-07-28 DIAGNOSIS — Z01812 Encounter for preprocedural laboratory examination: Secondary | ICD-10-CM | POA: Insufficient documentation

## 2011-07-28 DIAGNOSIS — K259 Gastric ulcer, unspecified as acute or chronic, without hemorrhage or perforation: Secondary | ICD-10-CM

## 2011-07-28 HISTORY — DX: Pneumonia, unspecified organism: J18.9

## 2011-07-28 HISTORY — DX: Shortness of breath: R06.02

## 2011-07-28 HISTORY — PX: MALONEY DILATION: SHX5535

## 2011-07-28 HISTORY — DX: Angina pectoris, unspecified: I20.9

## 2011-07-28 HISTORY — DX: Headache: R51

## 2011-07-28 HISTORY — DX: Unspecified osteoarthritis, unspecified site: M19.90

## 2011-07-28 SURGERY — ESOPHAGOGASTRODUODENOSCOPY (EGD) WITH PROPOFOL
Anesthesia: Monitor Anesthesia Care

## 2011-07-28 MED ORDER — SCOPOLAMINE 1 MG/3DAYS TD PT72
MEDICATED_PATCH | TRANSDERMAL | Status: AC
  Filled 2011-07-28: qty 1

## 2011-07-28 MED ORDER — DEXAMETHASONE SODIUM PHOSPHATE 4 MG/ML IJ SOLN
INTRAMUSCULAR | Status: AC
  Filled 2011-07-28: qty 1

## 2011-07-28 MED ORDER — GLYCOPYRROLATE 0.2 MG/ML IJ SOLN
0.2000 mg | Freq: Once | INTRAMUSCULAR | Status: AC
  Administered 2011-07-28: 0.2 mg via INTRAVENOUS

## 2011-07-28 MED ORDER — PROPOFOL 10 MG/ML IV EMUL
INTRAVENOUS | Status: DC | PRN
  Administered 2011-07-28: 55 ug/kg/min via INTRAVENOUS

## 2011-07-28 MED ORDER — LACTATED RINGERS IV SOLN
INTRAVENOUS | Status: DC
  Administered 2011-07-28: 10:00:00 via INTRAVENOUS

## 2011-07-28 MED ORDER — GLYCOPYRROLATE 0.2 MG/ML IJ SOLN
INTRAMUSCULAR | Status: AC
  Filled 2011-07-28: qty 1

## 2011-07-28 MED ORDER — SCOPOLAMINE 1 MG/3DAYS TD PT72
1.0000 | MEDICATED_PATCH | Freq: Once | TRANSDERMAL | Status: DC
  Administered 2011-07-28: 1.5 mg via TRANSDERMAL

## 2011-07-28 MED ORDER — BUTAMBEN-TETRACAINE-BENZOCAINE 2-2-14 % EX AERO
1.0000 | INHALATION_SPRAY | Freq: Once | CUTANEOUS | Status: AC
  Administered 2011-07-28: 1 via TOPICAL

## 2011-07-28 MED ORDER — MIDAZOLAM HCL 2 MG/2ML IJ SOLN
INTRAMUSCULAR | Status: AC
  Filled 2011-07-28: qty 2

## 2011-07-28 MED ORDER — ONDANSETRON HCL 4 MG/2ML IJ SOLN
4.0000 mg | Freq: Once | INTRAMUSCULAR | Status: AC
  Administered 2011-07-28: 4 mg via INTRAVENOUS

## 2011-07-28 MED ORDER — DEXAMETHASONE SODIUM PHOSPHATE 10 MG/ML IJ SOLN: 4.0000 mg | INTRAMUSCULAR | Status: DC

## 2011-07-28 MED ORDER — PROPOFOL 10 MG/ML IV EMUL
INTRAVENOUS | Status: AC
  Filled 2011-07-28: qty 20

## 2011-07-28 MED ORDER — MIDAZOLAM HCL 5 MG/5ML IJ SOLN
INTRAMUSCULAR | Status: DC | PRN
  Administered 2011-07-28: 2 mg via INTRAVENOUS

## 2011-07-28 MED ORDER — DEXAMETHASONE SODIUM PHOSPHATE 4 MG/ML IJ SOLN
4.0000 mg | Freq: Once | INTRAMUSCULAR | Status: AC
  Administered 2011-07-28: 4 mg via INTRAVENOUS

## 2011-07-28 MED ORDER — ONDANSETRON HCL 4 MG/2ML IJ SOLN
INTRAMUSCULAR | Status: AC
  Filled 2011-07-28: qty 2

## 2011-07-28 MED ORDER — SIMETHICONE 40 MG/0.6ML PO SUSP
ORAL | Status: DC | PRN
  Administered 2011-07-28: 11:00:00

## 2011-07-28 MED ORDER — LIDOCAINE HCL 1 % IJ SOLN
INTRAMUSCULAR | Status: DC | PRN
  Administered 2011-07-28: 25 mg via INTRADERMAL

## 2011-07-28 MED ORDER — LIDOCAINE HCL (PF) 1 % IJ SOLN
INTRAMUSCULAR | Status: AC
  Filled 2011-07-28: qty 5

## 2011-07-28 MED ORDER — MIDAZOLAM HCL 2 MG/2ML IJ SOLN
1.0000 mg | INTRAMUSCULAR | Status: DC | PRN
  Administered 2011-07-28: 2 mg via INTRAVENOUS

## 2011-07-28 MED ORDER — STERILE WATER FOR IRRIGATION IR SOLN
Status: DC | PRN
  Administered 2011-07-28: 1000 mL

## 2011-07-28 SURGICAL SUPPLY — 19 items
BLOCK BITE 60FR ADLT L/F BLUE (MISCELLANEOUS) ×2 IMPLANT
DEVICE CLIP HEMOSTAT 235CM (CLIP) IMPLANT
ELECT REM PT RETURN 9FT ADLT (ELECTROSURGICAL)
ELECTRODE REM PT RTRN 9FT ADLT (ELECTROSURGICAL) IMPLANT
FLOOR PAD 36X40 (MISCELLANEOUS) ×2
FORCEP RJ3 GP 1.8X160 W-NEEDLE (CUTTING FORCEPS) ×1 IMPLANT
FORCEPS BIOP RAD 4 LRG CAP 4 (CUTTING FORCEPS) ×2 IMPLANT
MANIFOLD NEPTUNE WASTE (CANNULA) ×2 IMPLANT
NDL SCLEROTHERAPY 25GX240 (NEEDLE) ×1 IMPLANT
NEEDLE SCLEROTHERAPY 25GX240 (NEEDLE) IMPLANT
PAD FLOOR 36X40 (MISCELLANEOUS) ×1 IMPLANT
PROBE APC STR FIRE (PROBE) ×1 IMPLANT
PROBE INJECTION GOLD (MISCELLANEOUS)
PROBE INJECTION GOLD 7FR (MISCELLANEOUS) ×1 IMPLANT
SNARE ROTATE MED OVAL 20MM (MISCELLANEOUS) ×1 IMPLANT
SYR 50ML LL SCALE MARK (SYRINGE) ×1 IMPLANT
TUBING ENDO SMARTCAP PENTAX (MISCELLANEOUS) ×2 IMPLANT
TUBING IRRIGATION ENDOGATOR (MISCELLANEOUS) ×2 IMPLANT
WATER STERILE IRR 1000ML POUR (IV SOLUTION) ×2 IMPLANT

## 2011-07-28 NOTE — H&P (Addendum)
Gerrit Halls, NP 07/25/2011 9:08 AM Addendum  Primary Care Physician: Bennie Pierini, FNP, FNP  Primary Gastroenterologist: Dr. Jena Gauss  Chief Complaint   Patient presents with   .  Diarrhea   .  Nausea   .  Emesis   .  Gastrophageal Reflux    HPI:  [name] is a pleasant 65 year old female who presents today due to chronic nausea, reflux, dysphagia, epigastric pain, and loose stools. She has a history of several dilations in the past by Dr. Arlyce Dice, but I do not have these reports at this moment. Last one was done in 2008/2009 per her report. Last colonoscopy 2008 or 2009.  She reports intermittent nausea and severe reflux for close to one year. She drinks Dr. Reino Kent to get relief and burp. She has belching symptoms. She is taking Prilosec daily without relief. She has been on Mobic for 2 years, and she consumes 2-3 Goody's powders daily. She has noted epigastric burning, unrelated to eating/drinking for a year as well. She notes dysphagia with mostly liquids, food slightly easier for several years. She has never had much improvement with dilations in the past.  She also reports a year long history of intermittent, sharp, lower abdominal pain prior to loose stools. Stools upwards of 4-6 times per day. Feels better afterward. No melena or brbpr. No fever or chills. Denies weight loss; however, she has decreased intake because of fear of loose stools. Most diarrhea postprandial.  ADDENDUM: Records received from Dr. Arlyce Dice.  Aug 2009 colonoscopy: findings normal to cecum, scattered sigmoid diverticula  Aug 2009 EGD: distal esophageal stricture, s/p Maloney dilation, records also state duodenal stricture, stating "unable to pass 10mm scope through pylorus".  Past Medical History   Diagnosis  Date   .  GERD (gastroesophageal reflux disease)    .  HTN (hypertension)    .  Hypercholesterolemia    .  Depression    .  Anxiety    .  S/P colonoscopy  05/30/2008     Dr. Arlyce Dice: (records reviewed  by AS, NP) findings normal to cecum, scattered diverticula in sigmoid colon   .  S/P endoscopy  05/15/2008     Dr. Arlyce Dice: (records reviewed by AS,NP): [facility] dilator for stricture distal esophagus, also noted duodenal stricture, op notes state unable to pass 10mm scope through pylorus   .  Hypercholesteremia    .  IBS (irritable bowel syndrome)    .  Complication of anesthesia    .  PONV (postoperative nausea and vomiting)    .  HPV in female     Past Surgical History   Procedure  Date   .  Cholecystectomy    .  Appendectomy    .  Tumor removed from arm    .  Tubal ligation    .  Carpal tunnel release      right hand   .  Back surgery      X 2    Current Outpatient Prescriptions   Medication  Sig  Dispense  Refill   .  ALPRAZolam (XANAX) 0.5 MG tablet  Take 0.5 mg by mouth at bedtime as needed.     .  calcium carbonate (OS-CAL) 600 MG TABS  Take 600 mg by mouth 2 (two) times daily with a meal.     .  Cholecalciferol (VITAMIN D3) 1000 UNITS CAPS  Take 1,000 mg by mouth 3 (three) times daily.     .  fish oil-omega-3 fatty acids 1000 MG  capsule  Take 2 g by mouth daily.     Marland Kitchen  HYDROcodone-acetaminophen (NORCO) 10-325 MG per tablet  Take 1 tablet by mouth every 6 (six) hours as needed.     Marland Kitchen  lisinopril-hydrochlorothiazide (PRINZIDE,ZESTORETIC) 10-12.5 MG per tablet  Take 1 tablet by mouth daily.     .  meloxicam (MOBIC) 15 MG tablet  Take 15 mg by mouth daily.     Marland Kitchen  omeprazole (PRILOSEC) 20 MG capsule  Take 20 mg by mouth daily.     Marland Kitchen  PARoxetine (PAXIL) 20 MG tablet  Take 20 mg by mouth every morning.     .  Potassium 99 MG TABS  Take 99 mg by mouth.     .  pyridOXINE (VITAMIN B-6) 100 MG tablet  Take 100 mg by mouth daily.     .  simvastatin (ZOCOR) 40 MG tablet  Take 40 mg by mouth at bedtime.     .  vitamin C (ASCORBIC ACID) 500 MG tablet  Take 500 mg by mouth daily.     .  vitamin E (VITAMIN E) 400 UNIT capsule  Take 400 Units by mouth daily.                  Allergies as  of 06/30/2011 - Review Complete 06/30/2011   Allergen  Reaction  Noted   .  Codeine   05/05/2008    Family History   Problem  Relation  Age of Onset   .  Colon cancer  Neg Hx     History    Social History   .  Marital Status:  Married     Spouse Name:  N/A     Number of Children:  5   .  Years of Education:  N/A    Occupational History   .  disability     Social History Main Topics   .  Smoking status:  Current Everyday Smoker -- 1.0 packs/day for 50 years     Types:  Cigarettes   .  Smokeless tobacco:  Not on file     Comment: since teenager    .  Alcohol Use:  No      hx of ETOH abuse in past, none in 5 years   .  Drug Use:  No   .  Sexually Active:  Not on file    Other Topics  Concern   .  Not on file    Social History Narrative   .  No narrative on file    Review of Systems:  Gen: Denies any fever, chills, fatigue, weight loss, + lack of appetite.  CV: Denies chest pain, heart palpitations, peripheral edema, syncope.  Resp: Denies shortness of breath at rest or with exertion. Denies wheezing or cough.  GI: Denies odynophagia. + dysphagia. Denies jaundice, hematemesis, fecal incontinence.  GU : Denies urinary burning, urinary frequency, urinary hesitancy  MS: Denies joint pain, muscle weakness, cramps, or limitation of movement.  Derm: Denies rash, itching, dry skin  Psych: Denies depression, anxiety, memory loss, and confusion  Heme: Denies bruising, bleeding, and enlarged lymph nodes.  Physical Exam:  BP 127/78  Pulse 63  Temp(Src) 97.6 F (36.4 C) (Temporal)  Ht 5\' 5"  (1.651 m)  Wt 186 lb 9.6 oz (84.641 kg)  BMI 31.05 kg/m2  General: Alert and oriented. Pleasant and cooperative. Well-nourished and well-developed.  Head: Normocephalic and atraumatic.  Eyes: Without icterus, sclera clear and conjunctiva pink.  Ears: Normal  auditory acuity.  Nose: No deformity, discharge, or lesions.  Mouth: No deformity or lesions, oral mucosa pink.  Neck: Supple,  without mass or thyromegaly.  Lungs: Clear to auscultation bilaterally. No wheezes, rales, or rhonchi. No distress.  Heart: S1, S2 present without murmurs appreciated.  Abdomen: +BS, soft, mildly tender to palpation epigastric region; non-distended. No HSM noted. No guarding or rebound. No masses appreciated.  Rectal: Deferred  Msk: Symmetrical without gross deformities. Normal posture.  Extremities: Without clubbing or edema.  Neurologic: Alert and oriented x4; grossly normal neurologically.  Skin: Intact without significant lesions or rashes.  Cervical Nodes: No significant cervical adenopathy.  Psych: Alert and cooperative. Normal mood and affect.   Previous Version  [place] Score 06/30/2011 4:36 PM Signed  Cc to PCP Gerrit Halls, NP 07/13/2011 1:32 PM Signed  Quick Note:  Positive lactoferrin. How is diarrhea? Should be on Bentyl. She is scheduled for an EGD in the near future. I know she is still nauseated some, but we will be doing EGD.  ______ Gerrit Halls, NP 07/14/2011 11:25 AM Signed  Quick Note:  Great. Can follow=up after EGD.  ______ Epigastric pain - Gerrit Halls, NP 07/25/2011 9:06 AM Addendum  65 year old female with year-long history of epigastric "burning" in the setting of daily Goody's powder (2-3 day) use. Also takes Mobic chronically. She also notes severe reflux, nocturnal reflux, and dysphagia intermittently. She has had several dilations in the past by Dr. Arlyce Dice; these records have been requested. No melena noted. She will need further evaluation in the near future with an upper endoscopy and dilation. She has a history of ETOH abuse but has abstained for 5 years; she also has multiple medications that could possibly cause an issue with sedation. Therefore, we will proceed with this procedure with Propofol in the OR secondary to likely difficult sedation.  Proceed with upper endoscopy/dilation in the near future with Dr. Jena Gauss. The risks, benefits, and alternatives have  been discussed in detail with patient. They have stated understanding and desire to proceed.  Stop Prilosec.  STOP ALL NSAIDS, ASPIRIN POWDERS  Switch to Aciphex daily, #14 samples provided  ADDENDUM 10/22: records received, recorded in History as well as HPI. See above.  Previous Version  Dysphagia - Gerrit Halls, NP 06/30/2011 3:00 PM Signed  See epigastric pain.  Diarrhea - Gerrit Halls, NP 06/30/2011 3:08 PM Signed  Chronic loose stools for at least one year, preceded by lower abdominal cramping. Relieved thereafter. No warning signs such as weight loss, rectal bleeding. She has had a colonoscopy in 2008 or 2009; we have requested these results. Symptoms seem to correlate with IBS-D. Will treat symptomatically, enforce dietary modification, f/u in 3 mos.  Stool studies (Cdiff PCR, Giardia, lactoferrin, Culture)  High fiber, low-fat diet  Bentyl 10 mg qac and qhs prn  3 mos f/u  I have seen & examined the patient prior to the procedure(s) today and reviewed the history and physical/consultation.  There have been no changes.  After consideration of the risks, benefits, alternatives and imponderables, the patient has consented to the procedure(s).

## 2011-07-28 NOTE — Anesthesia Preprocedure Evaluation (Signed)
Anesthesia Evaluation  Patient identified by MRN, date of birth, ID band Patient awake  General Assessment Comment  Reviewed: Allergy & Precautions, H&P , NPO status , Patient's Chart, lab work & pertinent test results  History of Anesthesia Complications (+) PONV  Airway Mallampati: II  Neck ROM: Full    Dental  (+) Edentulous Upper and Edentulous Lower   Pulmonary (+) shortness of breath COPDCurrent Smoker  + rhonchi        Cardiovascular hypertension, Pt. on medications - anginaRegular Normal    Neuro/Psych PSYCHIATRIC DISORDERS Anxiety Depression    GI/Hepatic GERD Medicated and Controlled  Endo/Other    Renal/GU      Musculoskeletal   Abdominal   Peds  Hematology   Anesthesia Other Findings   Reproductive/Obstetrics                           Anesthesia Physical Anesthesia Plan  ASA: III  Anesthesia Plan: MAC   Post-op Pain Management:    Induction: Intravenous  Airway Management Planned: Simple Face Mask  Additional Equipment:   Intra-op Plan:   Post-operative Plan:   Informed Consent: I have reviewed the patients History and Physical, chart, labs and discussed the procedure including the risks, benefits and alternatives for the proposed anesthesia with the patient or authorized representative who has indicated his/her understanding and acceptance.     Plan Discussed with:   Anesthesia Plan Comments:         Anesthesia Quick Evaluation

## 2011-07-28 NOTE — Anesthesia Postprocedure Evaluation (Signed)
  Anesthesia Post-op Note  Patient: [name]  Procedure(s) Performed:  ESOPHAGOGASTRODUODENOSCOPY (EGD) WITH ESOPHAGEAL DILATION - Dilated to 56 Maloney.; ESOPHAGOGASTRODUODENOSCOPY (EGD) WITH PROPOFOL - Esophagogastroduodenoscopy with Biopsy  Patient Location: PACU  Anesthesia Type: MAC  Level of Consciousness: awake, alert , oriented and patient cooperative  Airway and Oxygen Therapy: Patient Spontanous Breathing  Post-op Pain: none  Post-op Assessment: Post-op Vital signs reviewed, Patient's Cardiovascular Status Stable, Respiratory Function Stable, Patent Airway and No signs of Nausea or vomiting  Post-op Vital Signs: Reviewed and stable  Complications: No apparent anesthesia complications

## 2011-07-28 NOTE — Transfer of Care (Signed)
Immediate Anesthesia Transfer of Care Note  Patient: [name]  Procedure(s) Performed:  ESOPHAGOGASTRODUODENOSCOPY (EGD) WITH ESOPHAGEAL DILATION - Dilated to 56 Maloney.; ESOPHAGOGASTRODUODENOSCOPY (EGD) WITH PROPOFOL - Esophagogastroduodenoscopy with Biopsy  Patient Location: PACU  Anesthesia Type: MAC  Level of Consciousness: awake, alert , oriented and patient cooperative  Airway & Oxygen Therapy: Patient Spontanous Breathing and Patient connected to face mask oxygen  Post-op Assessment: Report given to PACU RN, Post -op Vital signs reviewed and stable and Patient moving all extremities  Post vital signs: Reviewed and stable  Complications: No apparent anesthesia complications

## 2011-08-03 ENCOUNTER — Encounter (HOSPITAL_COMMUNITY): Payer: Self-pay | Admitting: Internal Medicine

## 2011-08-29 NOTE — Progress Notes (Signed)
REVIEWED.  

## 2011-10-28 ENCOUNTER — Ambulatory Visit: Payer: Medicare Other | Admitting: Gastroenterology

## 2011-11-09 ENCOUNTER — Ambulatory Visit (INDEPENDENT_AMBULATORY_CARE_PROVIDER_SITE_OTHER): Payer: Medicare Other | Admitting: Gastroenterology

## 2011-11-09 ENCOUNTER — Encounter: Payer: Self-pay | Admitting: Gastroenterology

## 2011-11-09 DIAGNOSIS — R197 Diarrhea, unspecified: Secondary | ICD-10-CM

## 2011-11-09 DIAGNOSIS — K279 Peptic ulcer, site unspecified, unspecified as acute or chronic, without hemorrhage or perforation: Secondary | ICD-10-CM

## 2011-11-09 DIAGNOSIS — K529 Noninfective gastroenteritis and colitis, unspecified: Secondary | ICD-10-CM | POA: Insufficient documentation

## 2011-11-09 MED ORDER — DICYCLOMINE HCL 10 MG PO CAPS: 10.0000 mg | ORAL_CAPSULE | Freq: Four times a day (QID) | ORAL | Status: DC

## 2011-11-09 NOTE — Progress Notes (Signed)
Primary Care Physician:  Bennie Pierini, FNP, FNP  Primary Gastroenterologist:  Roetta Sessions, MD   Chief Complaint  Patient presents with  . Follow-up    HPI:  DANDREA MEDDERS is a 66 y.o. female here for follow up of EGD three months ago. She had Schatzki's ring/UES stricture, pyloric channel ulcer (1cm) in setting of Mobic and daily Goody's powders.   Since she stopped Mobic and almost eliminated asa powders she rarely has indigestion. No dysphagia s/p dilation. Takes Goody's only with severe HA, couple of times per month. Taking Bentyl tid. BM 1-4 per day. Much better. No melena, brbpr. No abd pain. Appetite good. She notes that she took Aciphex for four weeks but her insurance would not cover. She has remained on omeprazole 20mg  daily which she was on at time of EGD.  PP loose stools better on Bentyl. Stool studies negative except for positive lactoferrin.   Current Outpatient Prescriptions  Medication Sig Dispense Refill  . ALPRAZolam (XANAX) 0.5 MG tablet Take 0.5 mg by mouth 3 (three) times daily as needed. Anxiety      . Aspirin-Acetaminophen (GOODYS BODY PAIN PO) Take 1 packet by mouth daily as needed. Rarely takes. Sometimes none in one week. Only with really bad headaches.      . calcium carbonate (OS-CAL) 600 MG TABS Take 600 mg by mouth daily.       . Cholecalciferol (VITAMIN D3) 1000 UNITS CAPS Take 3,000 mg by mouth daily.       Marland Kitchen dicyclomine (BENTYL) 10 MG capsule Take 1 capsule (10 mg total) by mouth 4 (four) times daily.  120 capsule  5  . diphenhydrAMINE (BENADRYL) 25 MG tablet Take 25 mg by mouth 2 (two) times daily as needed. Allergies       . HYDROcodone-acetaminophen (NORCO) 10-325 MG per tablet Take 1 tablet by mouth every 6 (six) hours as needed. Pain      . lisinopril-hydrochlorothiazide (PRINZIDE,ZESTORETIC) 10-12.5 MG per tablet Take 1 tablet by mouth daily.        . Omega-3 Fatty Acids (FISH OIL) 1200 MG CAPS Take 2,400 capsules by mouth daily.          Marland Kitchen omeprazole (PRILOSEC) 20 MG capsule Take 20 mg by mouth daily.      Marland Kitchen PARoxetine (PAXIL) 20 MG tablet Take 20 mg by mouth every morning.        . Potassium 99 MG TABS Take 99 mg by mouth every other day.       . pyridOXINE (VITAMIN B-6) 100 MG tablet Take 100 mg by mouth daily.        . simvastatin (ZOCOR) 40 MG tablet Take 40 mg by mouth at bedtime.        . vitamin C (ASCORBIC ACID) 500 MG tablet Take 1,000 mg by mouth daily.       . vitamin E (VITAMIN E) 400 UNIT capsule Take 800 Units by mouth daily.         Allergies as of 11/09/2011 - Review Complete 11/09/2011  Allergen Reaction Noted  . Codeine Nausea And Vomiting 05/05/2008    Past Medical History  Diagnosis Date  . GERD (gastroesophageal reflux disease)   . HTN (hypertension)   . Hypercholesterolemia   . Depression   . Anxiety   . S/P colonoscopy 05/30/2008    Dr. Arlyce Dice: (records reviewed by AS, NP) findings normal to cecum, scattered diverticula in sigmoid colon   . S/P endoscopy 05/15/2008    Dr. Arlyce Dice: (  records reviewed by AS,NP): Maloney dilator for stricture distal esophagus, also noted duodenal stricture, op notes state unable to pass 10mm scope through pylorus  . Hypercholesteremia   . IBS (irritable bowel syndrome)   . Complication of anesthesia   . PONV (postoperative nausea and vomiting)   . HPV in female   . Angina     with indigestion  . Shortness of breath     with exertion  . Headache     normal h/a  . Arthritis     all over esp. in joints  . Pneumonia 2007    Past Surgical History  Procedure Date  . Tumor removed from arm   . Carpal tunnel release     right hand  . Back surgery     X 2  . Appendectomy     66 years old  . Cholecystectomy 1984  . Tubal ligation 1976  . Maloney dilation 07/28/2011    Schatzki's ring s/p 63F with small UES tear as well, small hh, 1cm pyloric channel ulcer, bx benign without H.Pylori. Mobic/goody's at time.    Family History  Problem Relation Age of  Onset  . Colon cancer Neg Hx   . Anesthesia problems Neg Hx   . Hypotension Neg Hx   . Malignant hyperthermia Neg Hx   . Pseudochol deficiency Neg Hx     History   Social History  . Marital Status: Married    Spouse Name: N/A    Number of Children: 5  . Years of Education: N/A   Occupational History  . disability    Social History Main Topics  . Smoking status: Current Everyday Smoker -- 1.0 packs/day for 50 years    Types: Cigarettes  . Smokeless tobacco: Not on file   Comment: since teenager  . Alcohol Use: No     hx of ETOH abuse in past, none in 5 years  . Drug Use: No  . Sexually Active: No   Other Topics Concern  . Not on file   Social History Narrative  . No narrative on file      ROS:  General: Negative for anorexia, weight loss, fever, chills, fatigue, weakness. Eyes: Negative for vision changes.  ENT: Negative for hoarseness, difficulty swallowing , nasal congestion. CV: Negative for chest pain, angina, palpitations, dyspnea on exertion, peripheral edema.  Respiratory: Negative for dyspnea at rest, dyspnea on exertion, cough, sputum, wheezing.  GI: See history of present illness. GU:  Negative for dysuria, hematuria, urinary incontinence, urinary frequency, nocturnal urination.  MS: Negative for joint pain, low back pain.  Derm: Negative for rash or itching.  Neuro: Negative for weakness, abnormal sensation, seizure, frequent headaches (rare now), memory loss, confusion.  Psych: Negative for anxiety, depression, suicidal ideation, hallucinations.  Endo: Negative for unusual weight change.  Heme: Negative for bruising or bleeding. Allergy: Negative for rash or hives.    Physical Examination:  BP 119/55  Pulse 63  Temp(Src) 98.2 F (36.8 C) (Temporal)  Ht 5\' 5"  (1.651 m)  Wt 184 lb 3.2 oz (83.553 kg)  BMI 30.65 kg/m2   General: Well-nourished, well-developed in no acute distress.  Head: Normocephalic, atraumatic.   Eyes: Conjunctiva pink, no  icterus. Mouth: Oropharyngeal mucosa moist and pink , no lesions erythema or exudate. Neck: Supple without thyromegaly, masses, or lymphadenopathy.  Lungs: Clear to auscultation bilaterally.  Heart: Regular rate and rhythm, no murmurs rubs or gallops.  Abdomen: Bowel sounds are normal, nontender, nondistended, no hepatosplenomegaly or masses,  no abdominal bruits or    hernia , no rebound or guarding.   Rectal: Not performed. Extremities: No lower extremity edema. No clubbing or deformities.  Neuro: Alert and oriented x 4 , grossly normal neurologically.  Skin: Warm and dry, no rash or jaundice.   Psych: Alert and cooperative, normal mood and affect.

## 2011-11-09 NOTE — Assessment & Plan Note (Signed)
Due for surveillance EGD to verify ulcer has healed. Procedure with deep sedation due to polypharmacy. Previous EGD with propofol. Continue prilosec daily. Avoidance of nsaids/asa.

## 2011-11-09 NOTE — Patient Instructions (Signed)
Please call in 1-2 weeks to see if we have probiotic samples available. Currently we are out. Other option, is to eat one yogurt daily (preferably Dannon brand). We have scheduled you for a repeat upper endoscopy with Dr. Jena Gauss. I will discuss your diarrhea with Dr. Jena Gauss. Further recommendations to follow.

## 2011-11-09 NOTE — Assessment & Plan Note (Signed)
Diarrhea X 1 year. Doing better on Bentyl. Last TCS 2009. Continue Bentyl. Add probiotics or yogurt daily. Currently do not have samples of probiotics but she can call in 1-2 weeks and obtain some if available. Will discuss further with Dr. Jena Gauss as patient denies h/o chronic diarrhea in past. Denies prior IBS diagnosis.

## 2011-11-10 MED ORDER — DICYCLOMINE HCL 10 MG PO CAPS: 10.0000 mg | ORAL_CAPSULE | Freq: Four times a day (QID) | ORAL | Status: DC

## 2011-11-10 NOTE — Progress Notes (Signed)
Addended by: Tiffany Kocher on: 11/10/2011 11:48 AM   Modules accepted: Orders

## 2011-11-10 NOTE — Progress Notes (Signed)
Faxed to PCP

## 2011-11-15 ENCOUNTER — Encounter (HOSPITAL_COMMUNITY): Payer: Self-pay | Admitting: Pharmacy Technician

## 2011-11-16 NOTE — Progress Notes (Signed)
Please tell patient. I would like to screen for celiac which can cause chronic diarrhea. If blood test positive, then would do small bowel biopsy at time of upcoming EGD. Need to have blood work done as soon as possible. Thanks.

## 2011-11-16 NOTE — Progress Notes (Signed)
Pt aware, she said she would have it done when she came to [facility] for her preop visit.

## 2011-11-16 NOTE — Progress Notes (Signed)
Addended by: Tiffany Kocher on: 11/16/2011 04:26 PM   Modules accepted: Orders

## 2011-11-25 ENCOUNTER — Encounter (HOSPITAL_COMMUNITY)
Admission: RE | Admit: 2011-11-25 | Discharge: 2011-11-25 | Disposition: A | Discharge status: Home / Self Care | Payer: Medicare Other | Source: Ambulatory Visit | Attending: Internal Medicine | Admitting: Internal Medicine

## 2011-11-25 ENCOUNTER — Encounter (HOSPITAL_COMMUNITY): Payer: Self-pay

## 2011-11-25 LAB — BASIC METABOLIC PANEL
BUN: 7 mg/dL (ref 6–23)
Calcium: 9.5 mg/dL (ref 8.4–10.5)
GFR calc Af Amer: >90 mL/min (ref >90)
GFR calc non Af Amer: >90 mL/min (ref >90)
Glucose, Bld: 94 mg/dL (ref 70–99)
Potassium: 4.1 mEq/L (ref 3.5–5.1)
Sodium: 141 mEq/L (ref 135–145)

## 2011-11-25 LAB — HEMOGLOBIN AND HEMATOCRIT, BLOOD: Hemoglobin: 13.7 g/dL (ref 12.0–15.0)

## 2011-11-25 MED ORDER — ONDANSETRON HCL 4 MG/2ML IJ SOLN: 4.0000 mg | Freq: Once | INTRAMUSCULAR | Status: DC | PRN

## 2011-11-25 MED ORDER — FENTANYL CITRATE 0.05 MG/ML IJ SOLN: 25.0000 ug | INTRAMUSCULAR | Status: DC | PRN

## 2011-11-25 NOTE — Patient Instructions (Signed)
20 [name]  11/25/2011   Your procedure is scheduled on:  12/01/11  Report to [facility] at 07:50 AM.  Call this number if you have problems the morning of surgery: [phone]   Remember:   Do not eat food:After Midnight.  May have clear liquids:until Midnight .  Clear liquids include soda, tea, black coffee, apple or grape juice, broth.  Take these medicines the morning of surgery with A SIP OF WATER: Dicyclomine, Lisinopril-HCTZ, Omeprazole and Paroxetine. Take your Alprazolam and Hydrocodone only if needed.   Do not wear jewelry, make-up or nail polish.  Do not wear lotions, powders, or perfumes. You may wear deodorant.  Do not shave 48 hours prior to surgery.  Do not bring valuables to the hospital.  Contacts, dentures or bridgework may not be worn into surgery.  Leave suitcase in the car. After surgery it may be brought to your room.  For patients admitted to the hospital, checkout time is 11:00 AM the day of discharge.   Patients discharged the day of surgery will not be allowed to drive home.  Name and phone number of your driver:   Special Instructions: N/A   Please read over the following fact sheets that you were given: Anesthesia Post-op Instructions and Care and Recovery After Surgery    Esophagogastroduodenoscopy This is an endoscopic procedure (a procedure that uses a device like a flexible telescope) that allows your caregiver to view the upper stomach and small bowel. This test allows your caregiver to look at the esophagus. The esophagus carries food from your mouth to your stomach. They can also look at your duodenum. This is the first part of the small intestine that attaches to the stomach. This test is used to detect problems in the bowel such as ulcers and inflammation. PREPARATION FOR TEST Nothing to eat after midnight the day before the test. NORMAL FINDINGS Normal esophagus, stomach, and duodenum. Ranges for normal findings may vary among different  laboratories and hospitals. You should always check with your doctor after having lab work or other tests done to discuss the meaning of your test results and whether your values are considered within normal limits. MEANING OF TEST  Your caregiver will go over the test results with you and discuss the importance and meaning of your results, as well as treatment options and the need for additional tests if necessary. OBTAINING THE TEST RESULTS It is your responsibility to obtain your test results. Ask the lab or department performing the test when and how you will get your results. Document Released: 01/20/2005 Document Revised: 06/01/2011 Document Reviewed: 08/29/2008 [facility] Patient Information 2012 [place], [place].   PATIENT INSTRUCTIONS POST-ANESTHESIA  IMMEDIATELY FOLLOWING SURGERY:  Do not drive or operate machinery for the first twenty four hours after surgery.  Do not make any important decisions for twenty four hours after surgery or while taking narcotic pain medications or sedatives.  If you develop intractable nausea and vomiting or a severe headache please notify your doctor immediately.  FOLLOW-UP:  Please make an appointment with your surgeon as instructed. You do not need to follow up with anesthesia unless specifically instructed to do so.  WOUND CARE INSTRUCTIONS (if applicable):  Keep a dry clean dressing on the anesthesia/puncture wound site if there is drainage.  Once the wound has quit draining you may leave it open to air.  Generally you should leave the bandage intact for twenty four hours unless there is drainage.  If the epidural site drains  for more than 36-48 hours please call the anesthesia department.  QUESTIONS?:  Please feel free to call your physician or the hospital operator if you have any questions, and they will be happy to assist you.     [facility] Anesthesia Department [address] [place] [phone]

## 2011-11-28 NOTE — Progress Notes (Signed)
Quick Note:  Labs good. TTG negative. EGD as planned. ______

## 2011-11-29 NOTE — Progress Notes (Signed)
Quick Note:  Pt aware. egd scheduled for Thursday. ______

## 2011-12-01 ENCOUNTER — Encounter (HOSPITAL_COMMUNITY): Payer: Self-pay | Admitting: Anesthesiology

## 2011-12-01 ENCOUNTER — Ambulatory Visit (HOSPITAL_COMMUNITY): Payer: Medicare Other | Admitting: Anesthesiology

## 2011-12-01 ENCOUNTER — Ambulatory Visit (HOSPITAL_COMMUNITY)
Admission: RE | Admit: 2011-12-01 | Discharge: 2011-12-01 | Disposition: A | Discharge status: Home / Self Care | Payer: Medicare Other | Source: Ambulatory Visit | Attending: Internal Medicine | Admitting: Internal Medicine

## 2011-12-01 ENCOUNTER — Encounter (HOSPITAL_COMMUNITY): Payer: Self-pay | Admitting: *Deleted

## 2011-12-01 ENCOUNTER — Encounter (HOSPITAL_COMMUNITY)
Admission: RE | Disposition: A | Discharge status: Home / Self Care | Payer: Self-pay | Source: Ambulatory Visit | Attending: Internal Medicine

## 2011-12-01 DIAGNOSIS — R197 Diarrhea, unspecified: Secondary | ICD-10-CM

## 2011-12-01 DIAGNOSIS — J4489 Other specified chronic obstructive pulmonary disease: Secondary | ICD-10-CM | POA: Insufficient documentation

## 2011-12-01 DIAGNOSIS — K257 Chronic gastric ulcer without hemorrhage or perforation: Secondary | ICD-10-CM | POA: Insufficient documentation

## 2011-12-01 DIAGNOSIS — K311 Adult hypertrophic pyloric stenosis: Secondary | ICD-10-CM

## 2011-12-01 DIAGNOSIS — Z79899 Other long term (current) drug therapy: Secondary | ICD-10-CM | POA: Insufficient documentation

## 2011-12-01 DIAGNOSIS — R109 Unspecified abdominal pain: Secondary | ICD-10-CM

## 2011-12-01 DIAGNOSIS — Z9283 Personal history of failed moderate sedation: Secondary | ICD-10-CM | POA: Insufficient documentation

## 2011-12-01 DIAGNOSIS — Z86010 Personal history of colonic polyps: Secondary | ICD-10-CM

## 2011-12-01 DIAGNOSIS — I10 Essential (primary) hypertension: Secondary | ICD-10-CM | POA: Insufficient documentation

## 2011-12-01 DIAGNOSIS — J449 Chronic obstructive pulmonary disease, unspecified: Secondary | ICD-10-CM | POA: Insufficient documentation

## 2011-12-01 HISTORY — PX: ESOPHAGOGASTRODUODENOSCOPY: SHX1529

## 2011-12-01 SURGERY — ESOPHAGOGASTRODUODENOSCOPY (EGD) WITH PROPOFOL
Anesthesia: Monitor Anesthesia Care

## 2011-12-01 MED ORDER — GLYCOPYRROLATE 0.2 MG/ML IJ SOLN
0.2000 mg | Freq: Once | INTRAMUSCULAR | Status: AC
  Administered 2011-12-01: 0.2 mg via INTRAVENOUS

## 2011-12-01 MED ORDER — PANTOPRAZOLE SODIUM 40 MG PO TBEC: 40.0000 mg | DELAYED_RELEASE_TABLET | Freq: Every day | ORAL | Status: DC

## 2011-12-01 MED ORDER — DEXAMETHASONE SODIUM PHOSPHATE 4 MG/ML IJ SOLN
4.0000 mg | Freq: Once | INTRAMUSCULAR | Status: AC
  Administered 2011-12-01: 4 mg via INTRAVENOUS

## 2011-12-01 MED ORDER — FENTANYL CITRATE 0.05 MG/ML IJ SOLN: 25.0000 ug | INTRAMUSCULAR | Status: DC | PRN

## 2011-12-01 MED ORDER — STERILE WATER FOR IRRIGATION IR SOLN
Status: DC | PRN
  Administered 2011-12-01: 1000 mL

## 2011-12-01 MED ORDER — PROPOFOL 10 MG/ML IV EMUL
INTRAVENOUS | Status: DC | PRN
  Administered 2011-12-01: 100 ug/kg/min via INTRAVENOUS

## 2011-12-01 MED ORDER — PROPOFOL 10 MG/ML IV EMUL
INTRAVENOUS | Status: AC
  Filled 2011-12-01: qty 20

## 2011-12-01 MED ORDER — MIDAZOLAM HCL 2 MG/2ML IJ SOLN
1.0000 mg | INTRAMUSCULAR | Status: DC | PRN
  Administered 2011-12-01: 2 mg via INTRAVENOUS

## 2011-12-01 MED ORDER — DEXAMETHASONE SODIUM PHOSPHATE 4 MG/ML IJ SOLN
INTRAMUSCULAR | Status: AC
  Administered 2011-12-01: 4 mg via INTRAVENOUS
  Filled 2011-12-01: qty 1

## 2011-12-01 MED ORDER — STERILE WATER FOR IRRIGATION IR SOLN
Status: DC | PRN
  Administered 2011-12-01: 10:00:00

## 2011-12-01 MED ORDER — MIDAZOLAM HCL 2 MG/2ML IJ SOLN
INTRAMUSCULAR | Status: AC
  Filled 2011-12-01: qty 2

## 2011-12-01 MED ORDER — ONDANSETRON HCL 4 MG/2ML IJ SOLN
INTRAMUSCULAR | Status: AC
  Administered 2011-12-01: 4 mg via INTRAVENOUS
  Filled 2011-12-01: qty 2

## 2011-12-01 MED ORDER — LACTATED RINGERS IV SOLN
INTRAVENOUS | Status: DC
  Administered 2011-12-01: 09:00:00 via INTRAVENOUS

## 2011-12-01 MED ORDER — MIDAZOLAM HCL 2 MG/2ML IJ SOLN
INTRAMUSCULAR | Status: AC
  Administered 2011-12-01: 2 mg via INTRAVENOUS
  Filled 2011-12-01: qty 2

## 2011-12-01 MED ORDER — PROPOFOL 10 MG/ML IV BOLUS
INTRAVENOUS | Status: DC | PRN
  Administered 2011-12-01 (×2): 20 mg via INTRAVENOUS

## 2011-12-01 MED ORDER — BUTAMBEN-TETRACAINE-BENZOCAINE 2-2-14 % EX AERO
1.0000 | INHALATION_SPRAY | Freq: Once | CUTANEOUS | Status: AC
  Administered 2011-12-01: 1 via TOPICAL
  Filled 2011-12-01: qty 56

## 2011-12-01 MED ORDER — MIDAZOLAM HCL 5 MG/5ML IJ SOLN
INTRAMUSCULAR | Status: DC | PRN
  Administered 2011-12-01: 2 mg via INTRAVENOUS

## 2011-12-01 MED ORDER — GLYCOPYRROLATE 0.2 MG/ML IJ SOLN
INTRAMUSCULAR | Status: AC
  Administered 2011-12-01: 0.2 mg via INTRAVENOUS
  Filled 2011-12-01: qty 1

## 2011-12-01 MED ORDER — ONDANSETRON HCL 4 MG/2ML IJ SOLN: 4.0000 mg | Freq: Once | INTRAMUSCULAR | Status: DC | PRN

## 2011-12-01 MED ORDER — ONDANSETRON HCL 4 MG/2ML IJ SOLN
4.0000 mg | Freq: Once | INTRAMUSCULAR | Status: AC
  Administered 2011-12-01: 4 mg via INTRAVENOUS

## 2011-12-01 SURGICAL SUPPLY — 19 items
BLOCK BITE 60FR ADLT L/F BLUE (MISCELLANEOUS) ×2 IMPLANT
DEVICE CLIP HEMOSTAT 235CM (CLIP) IMPLANT
ELECT REM PT RETURN 9FT ADLT (ELECTROSURGICAL)
ELECTRODE REM PT RTRN 9FT ADLT (ELECTROSURGICAL) IMPLANT
FLOOR PAD 36X40 (MISCELLANEOUS) ×2
FORCEP RJ3 GP 1.8X160 W-NEEDLE (CUTTING FORCEPS) ×1 IMPLANT
FORCEPS BIOP RAD 4 LRG CAP 4 (CUTTING FORCEPS) ×1 IMPLANT
MANIFOLD NEPTUNE WASTE (CANNULA) ×2 IMPLANT
NDL SCLEROTHERAPY 25GX240 (NEEDLE) ×1 IMPLANT
NEEDLE SCLEROTHERAPY 25GX240 (NEEDLE) IMPLANT
PAD FLOOR 36X40 (MISCELLANEOUS) ×1 IMPLANT
PROBE APC STR FIRE (PROBE) ×1 IMPLANT
PROBE INJECTION GOLD (MISCELLANEOUS)
PROBE INJECTION GOLD 7FR (MISCELLANEOUS) ×1 IMPLANT
SNARE ROTATE MED OVAL 20MM (MISCELLANEOUS) ×1 IMPLANT
SYR 50ML LL SCALE MARK (SYRINGE) ×1 IMPLANT
TUBING ENDO SMARTCAP PENTAX (MISCELLANEOUS) ×2 IMPLANT
TUBING IRRIGATION ENDOGATOR (MISCELLANEOUS) ×2 IMPLANT
WATER STERILE IRR 1000ML POUR (IV SOLUTION) ×2 IMPLANT

## 2011-12-01 NOTE — Discharge Instructions (Addendum)
EGD Discharge instructions Please read the instructions outlined below and refer to this sheet in the next few weeks. These discharge instructions provide you with general information on caring for yourself after you leave the hospital. Your doctor may also give you specific instructions. While your treatment has been planned according to the most current medical practices available, unavoidable complications occasionally occur. If you have any problems or questions after discharge, please call your doctor. ACTIVITY  You may resume your regular activity but move at a slower pace for the next 24 hours.   Take frequent rest periods for the next 24 hours.   Walking will help expel (get rid of) the air and reduce the bloated feeling in your abdomen.   No driving for 24 hours (because of the anesthesia (medicine) used during the test).   You may shower.   Do not sign any important legal documents or operate any machinery for 24 hours (because of the anesthesia used during the test).  NUTRITION  Drink plenty of fluids.   You may resume your normal diet.   Begin with a light meal and progress to your normal diet.   Avoid alcoholic beverages for 24 hours or as instructed by your caregiver.  MEDICATIONS  You may resume your normal medications unless your caregiver tells you otherwise.  WHAT YOU CAN EXPECT TODAY  You may experience abdominal discomfort such as a feeling of fullness or "gas" pains.  FOLLOW-UP  Your doctor will discuss the results of your test with you.  SEEK IMMEDIATE MEDICAL ATTENTION IF ANY OF THE FOLLOWING OCCUR:  Excessive nausea (feeling sick to your stomach) and/or vomiting.   Severe abdominal pain and distention (swelling).   Trouble swallowing.   Temperature over 101 F (37.8 C).   Rectal bleeding or vomiting of blood.      Continue Dexilant 60 mg daily Stop taking nonsteroidal drugs entirely no aspirin powders etc.  Stop Prilosec; begin protonix 40  mg orally daily  Office followup in one month to further evaluate diarrheaEsophagogastroduodenoscopy This is an endoscopic procedure (a procedure that uses a device like a flexible telescope) that allows your caregiver to view the upper stomach and small bowel. This test allows your caregiver to look at the esophagus. The esophagus carries food from your mouth to your stomach. They can also look at your duodenum. This is the first part of the small intestine that attaches to the stomach. This test is used to detect problems in the bowel such as ulcers and inflammation. PREPARATION FOR TEST Nothing to eat after midnight the day before the test. NORMAL FINDINGS Normal esophagus, stomach, and duodenum. Ranges for normal findings may vary among different laboratories and hospitals. You should always check with your doctor after having lab work or other tests done to discuss the meaning of your test results and whether your values are considered within normal limits. MEANING OF TEST  Your caregiver will go over the test results with you and discuss the importance and meaning of your results, as well as treatment options and the need for additional tests if necessary. OBTAINING THE TEST RESULTS It is your responsibility to obtain your test results. Ask the lab or department performing the test when and how you will get your results. Document Released: 01/20/2005 Document Revised: 06/01/2011 Document Reviewed: 08/29/2008 [facility] Patient Information 2012 [place], [place].

## 2011-12-01 NOTE — Interval H&P Note (Signed)
History and Physical Interval Note:  12/01/2011 9:03 AM  [name]  has presented today for surgery, with the diagnosis of PUD and IBS  The various methods of treatment have been discussed with the patient and family. After consideration of risks, benefits and other options for treatment, the patient has consented to  Procedure(s) (LRB): ESOPHAGOGASTRODUODENOSCOPY (EGD) WITH PROPOFOL (N/A) as a surgical intervention .  The patients' history has been reviewed, patient examined, no change in status, stable for surgery.  I have reviewed the patients' chart and labs.  Questions were answered to the patient's satisfaction.     Eula Listen

## 2011-12-01 NOTE — Anesthesia Postprocedure Evaluation (Signed)
  Anesthesia Post-op Note  Patient: [name]  Procedure(s) Performed: Procedure(s) (LRB): ESOPHAGOGASTRODUODENOSCOPY (EGD) WITH PROPOFOL (N/A)  Patient Location: PACU  Anesthesia Type: MAC  Level of Consciousness: awake, alert  and oriented  Airway and Oxygen Therapy: Patient Spontanous Breathing and Patient connected to face mask oxygen  Post-op Pain: none  Post-op Assessment: Post-op Vital signs reviewed, Patient's Cardiovascular Status Stable, Respiratory Function Stable, Patent Airway and No signs of Nausea or vomiting  Post-op Vital Signs: Reviewed and stable  Complications: No apparent anesthesia complications

## 2011-12-01 NOTE — H&P (View-Only) (Signed)
Primary Care Physician:  MARTIN,MARY MARGARET, FNP, FNP  Primary Gastroenterologist:  Michael Rourk, MD   Chief Complaint  Patient presents with  . Follow-up    HPI:  [name] is a 65 y.o. female here for follow up of EGD three months ago. She had Schatzki's ring/UES stricture, pyloric channel ulcer (1cm) in setting of Mobic and daily Goody's powders.   Since she stopped Mobic and almost eliminated asa powders she rarely has indigestion. No dysphagia s/p dilation. Takes Goody's only with severe HA, couple of times per month. Taking Bentyl tid. BM 1-4 per day. Much better. No melena, brbpr. No abd pain. Appetite good. She notes that she took Aciphex for four weeks but her insurance would not cover. She has remained on omeprazole 20mg daily which she was on at time of EGD.  PP loose stools better on Bentyl. Stool studies negative except for positive lactoferrin.   Current Outpatient Prescriptions  Medication Sig Dispense Refill  . ALPRAZolam (XANAX) 0.5 MG tablet Take 0.5 mg by mouth 3 (three) times daily as needed. Anxiety      . Aspirin-Acetaminophen (GOODYS BODY PAIN PO) Take 1 packet by mouth daily as needed. Rarely takes. Sometimes none in one week. Only with really bad headaches.      . calcium carbonate (OS-CAL) 600 MG TABS Take 600 mg by mouth daily.       . Cholecalciferol (VITAMIN D3) 1000 UNITS CAPS Take 3,000 mg by mouth daily.       . dicyclomine (BENTYL) 10 MG capsule Take 1 capsule (10 mg total) by mouth 4 (four) times daily.  120 capsule  5  . diphenhydrAMINE (BENADRYL) 25 MG tablet Take 25 mg by mouth 2 (two) times daily as needed. Allergies       . HYDROcodone-acetaminophen (NORCO) 10-325 MG per tablet Take 1 tablet by mouth every 6 (six) hours as needed. Pain      . lisinopril-hydrochlorothiazide (PRINZIDE,ZESTORETIC) 10-12.5 MG per tablet Take 1 tablet by mouth daily.        . Omega-3 Fatty Acids (FISH OIL) 1200 MG CAPS Take 2,400 capsules by mouth daily.          . omeprazole (PRILOSEC) 20 MG capsule Take 20 mg by mouth daily.      . PARoxetine (PAXIL) 20 MG tablet Take 20 mg by mouth every morning.        . Potassium 99 MG TABS Take 99 mg by mouth every other day.       . pyridOXINE (VITAMIN B-6) 100 MG tablet Take 100 mg by mouth daily.        . simvastatin (ZOCOR) 40 MG tablet Take 40 mg by mouth at bedtime.        . vitamin C (ASCORBIC ACID) 500 MG tablet Take 1,000 mg by mouth daily.       . vitamin E (VITAMIN E) 400 UNIT capsule Take 800 Units by mouth daily.         Allergies as of 11/09/2011 - Review Complete 11/09/2011  Allergen Reaction Noted  . Codeine Nausea And Vomiting 05/05/2008    Past Medical History  Diagnosis Date  . GERD (gastroesophageal reflux disease)   . HTN (hypertension)   . Hypercholesterolemia   . Depression   . Anxiety   . S/P colonoscopy 05/30/2008    Dr. Kaplan: (records reviewed by AS, NP) findings normal to cecum, scattered diverticula in sigmoid colon   . S/P endoscopy 05/15/2008    Dr. Kaplan: (  records reviewed by AS,NP): Maloney dilator for stricture distal esophagus, also noted duodenal stricture, op notes state unable to pass 10mm scope through pylorus  . Hypercholesteremia   . IBS (irritable bowel syndrome)   . Complication of anesthesia   . PONV (postoperative nausea and vomiting)   . HPV in female   . Angina     with indigestion  . Shortness of breath     with exertion  . Headache     normal h/a  . Arthritis     all over esp. in joints  . Pneumonia 2007    Past Surgical History  Procedure Date  . Tumor removed from arm   . Carpal tunnel release     right hand  . Back surgery     X 2  . Appendectomy     66 years old  . Cholecystectomy 1984  . Tubal ligation 1976  . Maloney dilation 07/28/2011    Schatzki's ring s/p 57F with small UES tear as well, small hh, 1cm pyloric channel ulcer, bx benign without H.Pylori. Mobic/goody's at time.    Family History  Problem Relation Age of  Onset  . Colon cancer Neg Hx   . Anesthesia problems Neg Hx   . Hypotension Neg Hx   . Malignant hyperthermia Neg Hx   . Pseudochol deficiency Neg Hx     History   Social History  . Marital Status: Married    Spouse Name: N/A    Number of Children: 5  . Years of Education: N/A   Occupational History  . disability    Social History Main Topics  . Smoking status: Current Everyday Smoker -- 1.0 packs/day for 50 years    Types: Cigarettes  . Smokeless tobacco: Not on file   Comment: since teenager  . Alcohol Use: No     hx of ETOH abuse in past, none in 5 years  . Drug Use: No  . Sexually Active: No   Other Topics Concern  . Not on file   Social History Narrative  . No narrative on file      ROS:  General: Negative for anorexia, weight loss, fever, chills, fatigue, weakness. Eyes: Negative for vision changes.  ENT: Negative for hoarseness, difficulty swallowing , nasal congestion. CV: Negative for chest pain, angina, palpitations, dyspnea on exertion, peripheral edema.  Respiratory: Negative for dyspnea at rest, dyspnea on exertion, cough, sputum, wheezing.  GI: See history of present illness. GU:  Negative for dysuria, hematuria, urinary incontinence, urinary frequency, nocturnal urination.  MS: Negative for joint pain, low back pain.  Derm: Negative for rash or itching.  Neuro: Negative for weakness, abnormal sensation, seizure, frequent headaches (rare now), memory loss, confusion.  Psych: Negative for anxiety, depression, suicidal ideation, hallucinations.  Endo: Negative for unusual weight change.  Heme: Negative for bruising or bleeding. Allergy: Negative for rash or hives.    Physical Examination:  BP 119/55  Pulse 63  Temp(Src) 98.2 F (36.8 C) (Temporal)  Ht 5' 5" (1.651 m)  Wt 184 lb 3.2 oz (83.553 kg)  BMI 30.65 kg/m2   General: Well-nourished, well-developed in no acute distress.  Head: Normocephalic, atraumatic.   Eyes: Conjunctiva pink, no  icterus. Mouth: Oropharyngeal mucosa moist and pink , no lesions erythema or exudate. Neck: Supple without thyromegaly, masses, or lymphadenopathy.  Lungs: Clear to auscultation bilaterally.  Heart: Regular rate and rhythm, no murmurs rubs or gallops.  Abdomen: Bowel sounds are normal, nontender, nondistended, no hepatosplenomegaly or masses,   no abdominal bruits or    hernia , no rebound or guarding.   Rectal: Not performed. Extremities: No lower extremity edema. No clubbing or deformities.  Neuro: Alert and oriented x 4 , grossly normal neurologically.  Skin: Warm and dry, no rash or jaundice.   Psych: Alert and cooperative, normal mood and affect.         

## 2011-12-01 NOTE — Transfer of Care (Signed)
Immediate Anesthesia Transfer of Care Note  Patient: [name]  Procedure(s) Performed: Procedure(s) (LRB): ESOPHAGOGASTRODUODENOSCOPY (EGD) WITH PROPOFOL (N/A)  Patient Location: PACU  Anesthesia Type: MAC  Level of Consciousness: awake, alert  and oriented  Airway & Oxygen Therapy: Patient Spontanous Breathing and Patient connected to face mask oxygen  Post-op Assessment: Report given to PACU RN  Post vital signs: Reviewed and stable  Complications: No apparent anesthesia complications

## 2011-12-01 NOTE — Anesthesia Preprocedure Evaluation (Signed)
Anesthesia Evaluation  Patient identified by MRN, date of birth, ID band Patient awake    Reviewed: Allergy & Precautions, H&P , NPO status , Patient's Chart, lab work & pertinent test results  History of Anesthesia Complications (+) PONV  Airway Mallampati: II  Neck ROM: Full    Dental  (+) Edentulous Upper and Edentulous Lower   Pulmonary shortness of breath, COPDCurrent Smoker,  + rhonchi        Cardiovascular hypertension, Pt. on medications - anginaRegular Normal    Neuro/Psych PSYCHIATRIC DISORDERS Anxiety Depression    GI/Hepatic PUD, GERD-  Medicated and Controlled,  Endo/Other    Renal/GU      Musculoskeletal   Abdominal   Peds  Hematology   Anesthesia Other Findings   Reproductive/Obstetrics                           Anesthesia Physical Anesthesia Plan  ASA: III  Anesthesia Plan: MAC   Post-op Pain Management:    Induction: Intravenous  Airway Management Planned: Simple Face Mask  Additional Equipment:   Intra-op Plan:   Post-operative Plan:   Informed Consent: I have reviewed the patients History and Physical, chart, labs and discussed the procedure including the risks, benefits and alternatives for the proposed anesthesia with the patient or authorized representative who has indicated his/her understanding and acceptance.     Plan Discussed with:   Anesthesia Plan Comments:         Anesthesia Quick Evaluation

## 2011-12-01 NOTE — Op Note (Signed)
[name], [name]            ACCOUNT NO.:  [id number]  MEDICAL RECORD NO.:  [id number]  LOCATION:  [facility]                          FACILITY:  [facility]  PHYSICIAN:  R. Roetta Sessions, MD FACP FACGDATE OF BIRTH:  1946-09-14  DATE OF PROCEDURE:  12/01/2011 DATE OF DISCHARGE:                              OPERATIVE REPORT   PROCEDURE:  Diagnostic esophagogastroduodenoscopy.  INDICATIONS FOR PROCEDURE:  A 66 year old lady with a history of NSAID induced pyloric peptic ulcer disease.  She is here for followup EGD. She has been having more trouble with diarrhea than anything else. Celiac antibody tTG, IgA came back negative through the office recently. She has cut back on aspirin powders, but has not entirely stopped.  She has been on Prilosec chronically, still has some vague abdominal pain. EGD is now being done to verify ulcer healing.  Risks, benefits, limitations, alternatives, imponderables have been reviewed.  Because of poor sedation previously with Versed and Demerol, she is being done with deep sedation in the operating room with propofol.  PROCEDURE NOTE:  O2 saturation, blood pressure, pulse, and respirations were monitored throughout the entire procedure.  Propofol sedation per Dr. Marcos Eke and associates.  Cetacaine spray for topical pharyngeal anesthesia.  INSTRUMENTATION:  Pentax video chip system.  FINDINGS:  Examination of tubular esophagus revealed no mucosal abnormalities.  EG junction easily traversed.  Stomach:  Gastric cavity was emptied and insufflated well with air. Thorough exam of the gastric mucosa including retroflexed proximal stomach and esophagogastric junction demonstrated at least 3-cm hiatal hernia.  The previously noted pyloric channel ulcer was completely healed.  The remainder of the gastric mucosa appeared normal.  There was slight stenosis of the pyloric channel.  I was able to traverse it with a diagnostic scope and applying moderate pressure  this did produce some dilation of the pyloric channel.  There was minimal bleeding with this maneuver.  The bulb and 2nd portion appeared normal.  Therapeutic/diagnostic maneuvers performed:  None.  The patient tolerated the procedure well and was reactive in PACU.  IMPRESSION: 1. Normal esophagus. 2. Hiatal hernia. 3. Mild fibrotic pyloric stenosis status post dilation with passage of     the scope.  The previously noted gastric ulcer completely healed.     The remainder of the gastric mucosa appeared normal.  Normal D1 and     D2.  RECOMMENDATIONS: 1. I strongly recommended the patient completely refrain from taking     nonsteroidal agents of all sorts. 2. Stop Prilosec, trial Protonix 40 mg orally once daily. 3. Office followup in 1 month to reassess.  She may need further     evaluation of her diarrhea.  Microscopic colitis has not been ruled     out.     Jonathon Bellows, MD FACP [facility]     RMR/MEDQ  D:  12/01/2011  T:  12/01/2011  Job:  [id number]  cc:   Paulene Floor, NP Fax: [phone]

## 2011-12-28 ENCOUNTER — Encounter: Payer: Self-pay | Admitting: Internal Medicine

## 2011-12-29 ENCOUNTER — Encounter: Payer: Self-pay | Admitting: Gastroenterology

## 2011-12-29 ENCOUNTER — Ambulatory Visit: Payer: Medicare Other | Admitting: Gastroenterology

## 2011-12-29 ENCOUNTER — Ambulatory Visit (INDEPENDENT_AMBULATORY_CARE_PROVIDER_SITE_OTHER): Payer: Medicare Other | Admitting: Gastroenterology

## 2011-12-29 VITALS — BP 119/66 | HR 72 | Temp 97.7°F | Ht 65.0 in | Wt 186.4 lb

## 2011-12-29 DIAGNOSIS — K529 Noninfective gastroenteritis and colitis, unspecified: Secondary | ICD-10-CM

## 2011-12-29 DIAGNOSIS — K279 Peptic ulcer, site unspecified, unspecified as acute or chronic, without hemorrhage or perforation: Secondary | ICD-10-CM

## 2011-12-29 DIAGNOSIS — K219 Gastro-esophageal reflux disease without esophagitis: Secondary | ICD-10-CM | POA: Insufficient documentation

## 2011-12-29 DIAGNOSIS — K311 Adult hypertrophic pyloric stenosis: Secondary | ICD-10-CM

## 2011-12-29 DIAGNOSIS — R197 Diarrhea, unspecified: Secondary | ICD-10-CM

## 2011-12-29 NOTE — Patient Instructions (Signed)
Please call if you decide to pursue colonoscopy.

## 2011-12-29 NOTE — Assessment & Plan Note (Signed)
Patient is better on Bentyl however still having up to 5 stools daily 50 percent of the time. We discussed the fact that her diarrhea has been present for approximately one year. She really denies any chronic, long-term bowel issues therefore I am reluctant in labeling this as irritable bowel syndrome completely at this point. I think we need to consider the possibility of microscopic colitis. Patient is reluctant in pursuing colonoscopy at this time however she tells me she may reconsider the next couple of months if her stools do not improve. Her last colonoscopy was in 2009 but no indication that random biopsies were done, patient denied having diarrhea during that time.  She will continue Bentyl up to 4 times daily as needed for diarrhea. Encouraged consuming probiotics or yogurt daily. She will call if she decides to pursue TCS. If she decides within the next 1-2 months, we can triage her by phone.

## 2011-12-29 NOTE — Progress Notes (Signed)
Faxed to PCP

## 2011-12-29 NOTE — Progress Notes (Signed)
Primary Care Physician: Bennie Pierini, FNP, FNP  Primary Gastroenterologist:  Roetta Sessions, MD   Chief Complaint  Patient presents with  . Follow-up    diarrhea    HPI: [name] is a 66 y.o. female here for followup of recent EGD. See below for details. Heartburn all gone now off asa/nsaids. Diarrhea is better on Bentyl. She takes 10 mg 4 times daily. However half the time she has on average 4-5 stool/daily. Stools are shortly after meals. Related abdominal cramping which resolves after bowel movement. Other days she may have one to 2 stools, initial one solid. Denies nocturnal diarrhea, blood in the stool, melena.No weight loss.  Dysphagia resolved.  EGD 12/01/11: hh, mild fibrotic pyloric stenosis s/p dilation with passage of scope. Previously noted GU completely healed.   Started protonix. Stopped prilosec after last EGD. No longer consuming any aspirin products or NSAIDs.  Previously w/u of chronic diarrhea. Stool culture, CDiff PCR, Giardia negative. TTG normal.   Current Outpatient Prescriptions  Medication Sig Dispense Refill  . ALPRAZolam (XANAX) 0.5 MG tablet Take 0.5 mg by mouth 3 (three) times daily as needed. Anxiety      . calcium carbonate (OS-CAL) 600 MG TABS Take 600 mg by mouth daily.       . Cholecalciferol (VITAMIN D3) 1000 UNITS CAPS Take 3,000 mg by mouth daily.       Marland Kitchen dicyclomine (BENTYL) 10 MG capsule Take 1 capsule (10 mg total) by mouth 4 (four) times daily.  120 capsule  5  . diphenhydrAMINE (BENADRYL) 25 MG tablet Take 25 mg by mouth 2 (two) times daily as needed. For Allergies      . HYDROcodone-acetaminophen (NORCO) 10-325 MG per tablet Take 1 tablet by mouth every 6 (six) hours as needed. For Pain      . lisinopril-hydrochlorothiazide (PRINZIDE,ZESTORETIC) 10-12.5 MG per tablet Take 1 tablet by mouth daily.        . Multiple Vitamin (MULITIVITAMIN WITH MINERALS) TABS Take 1 tablet by mouth daily.      . Omega-3 Fatty Acids (FISH OIL) 1200  MG CAPS Take 2,400 capsules by mouth daily.       . pantoprazole (PROTONIX) 40 MG tablet Take 1 tablet (40 mg total) by mouth daily.  30 tablet  11  . PARoxetine (PAXIL) 20 MG tablet Take 20 mg by mouth every morning.        . Potassium 99 MG TABS Take 99 mg by mouth every other day.       . pyridOXINE (VITAMIN B-6) 100 MG tablet Take 100 mg by mouth daily.        . simvastatin (ZOCOR) 40 MG tablet Take 40 mg by mouth every morning.       . vitamin C (ASCORBIC ACID) 500 MG tablet Take 1,000 mg by mouth daily.         Allergies as of 12/29/2011 - Review Complete 12/29/2011  Allergen Reaction Noted  . Codeine Nausea And Vomiting and Other (See Comments) 05/05/2008  . Meloxicam Other (See Comments) 11/15/2011   Past Medical History  Diagnosis Date  . GERD (gastroesophageal reflux disease)   . HTN (hypertension)   . Hypercholesterolemia   . Depression   . Anxiety   . S/P colonoscopy 05/30/2008    Dr. Arlyce Dice: (records reviewed by AS, NP) findings normal to cecum, scattered diverticula in sigmoid colon   . S/P endoscopy 05/15/2008    Dr. Arlyce Dice: (records reviewed by AS,NP): [facility] dilator for stricture distal esophagus,  also noted duodenal stricture, op notes state unable to pass 10mm scope through pylorus  . Hypercholesteremia   . IBS (irritable bowel syndrome)   . Complication of anesthesia   . PONV (postoperative nausea and vomiting)   . HPV in female   . Angina     with indigestion  . Shortness of breath     with exertion  . Headache     normal h/a  . Arthritis     all over esp. in joints  . Pneumonia 2007   Past Surgical History  Procedure Date  . Tumor removed from arm   . Carpal tunnel release     right hand  . Back surgery     X 2  . Appendectomy     66 years old  . Cholecystectomy 1984  . Tubal ligation 1976  . Maloney dilation 07/28/2011    Schatzki's ring s/p 24F with small UES tear as well, small hh, 1cm pyloric channel ulcer, bx benign without H.Pylori.  Mobic/goody's at time.  . Esophagogastroduodenoscopy 12/01/2011    Hiatal hernia/Mild fibrotic pyloric stenosis status post dilation with passage of  the scope.  The previously noted gastric ulcer completely healed  The remainder of the gastric mucosa appeared normal   Family History  Problem Relation Age of Onset  . Colon cancer Neg Hx   . Anesthesia problems Neg Hx   . Hypotension Neg Hx   . Malignant hyperthermia Neg Hx   . Pseudochol deficiency Neg Hx    History   Social History  . Marital Status: Married    Spouse Name: N/A    Number of Children: 5  . Years of Education: N/A   Occupational History  . disability    Social History Main Topics  . Smoking status: Current Everyday Smoker -- 1.0 packs/day for 50 years    Types: Cigarettes  . Smokeless tobacco: None   Comment: since teenager  . Alcohol Use: No     hx of ETOH abuse in past, none in 5 years  . Drug Use: No  . Sexually Active: No   Other Topics Concern  . None   Social History Narrative  . None      ROS:  General: Negative for anorexia, weight loss, fever, chills, fatigue, weakness. ENT: Negative for hoarseness, difficulty swallowing , nasal congestion. CV: Negative for chest pain, angina, palpitations, dyspnea on exertion, peripheral edema.  Respiratory: Negative for dyspnea at rest, dyspnea on exertion, cough, sputum, wheezing.  GI: See history of present illness. GU:  Negative for dysuria, hematuria, urinary incontinence, urinary frequency, nocturnal urination.  Endo: Negative for unusual weight change.    Physical Examination:   BP 119/66  Pulse 72  Temp(Src) 97.7 F (36.5 C) (Temporal)  Ht 5\' 5"  (1.651 m)  Wt 186 lb 6.4 oz (84.55 kg)  BMI 31.02 kg/m2  General: Well-nourished, well-developed in no acute distress. Accompanied by spouse. Eyes: No icterus. Mouth: Oropharyngeal mucosa moist and pink , no lesions erythema or exudate. Lungs: Clear to auscultation bilaterally.  Heart: Regular  rate and rhythm, no murmurs rubs or gallops.  Abdomen: Bowel sounds are normal, nontender, nondistended, no hepatosplenomegaly or masses, no abdominal bruits or hernia , no rebound or guarding.   Extremities: No lower extremity edema. No clubbing or deformities. Neuro: Alert and oriented x 4   Skin: Warm and dry, no jaundice.   Psych: Alert and cooperative, normal mood and affect.

## 2011-12-29 NOTE — Assessment & Plan Note (Signed)
History of peptic ulcer disease, pyloric channel stenosis secondary to NSAID/aspirin use. At last endoscopy, ulcer had healed. The pyloric channel was still stenotic, dilated with the scope. Patient is completely asymptomatic. No longer on aspirin and NSAIDs. Continue pantoprazole 40 mg daily indefinitely

## 2012-01-04 ENCOUNTER — Ambulatory Visit: Payer: Medicare Other | Admitting: Gastroenterology

## 2012-01-04 ENCOUNTER — Other Ambulatory Visit: Payer: Self-pay | Admitting: Gastroenterology

## 2012-01-04 DIAGNOSIS — R197 Diarrhea, unspecified: Secondary | ICD-10-CM

## 2012-01-04 MED ORDER — PEG-KCL-NACL-NASULF-NA ASC-C 100 G PO SOLR: 1.0000 | Freq: Once | ORAL | Status: DC

## 2012-01-19 ENCOUNTER — Telehealth: Payer: Self-pay | Admitting: Gastroenterology

## 2012-01-19 NOTE — Telephone Encounter (Signed)
Pt called to cancel her TCS scheduled for 04/29- she stated she is not having any further problems- will call back if things change

## 2012-01-26 NOTE — Progress Notes (Signed)
REVIEWED.  

## 2012-01-30 ENCOUNTER — Ambulatory Visit (HOSPITAL_COMMUNITY): Admission: RE | Admit: 2012-01-30 | Payer: Medicare Other | Source: Ambulatory Visit | Admitting: Internal Medicine

## 2012-01-30 ENCOUNTER — Encounter (HOSPITAL_COMMUNITY): Admission: RE | Payer: Self-pay | Source: Ambulatory Visit

## 2012-01-30 SURGERY — COLONOSCOPY
Anesthesia: Moderate Sedation

## 2012-05-14 ENCOUNTER — Ambulatory Visit (INDEPENDENT_AMBULATORY_CARE_PROVIDER_SITE_OTHER): Payer: Medicare Other | Admitting: Gynecology

## 2012-05-14 ENCOUNTER — Other Ambulatory Visit (HOSPITAL_COMMUNITY)
Admission: RE | Admit: 2012-05-14 | Discharge: 2012-05-14 | Disposition: A | Discharge status: Home / Self Care | Payer: Medicare Other | Source: Ambulatory Visit | Attending: Gynecology | Admitting: Gynecology

## 2012-05-14 ENCOUNTER — Encounter: Payer: Self-pay | Admitting: Gynecology

## 2012-05-14 VITALS — BP 116/70 | Ht 65.0 in | Wt 186.0 lb

## 2012-05-14 DIAGNOSIS — N952 Postmenopausal atrophic vaginitis: Secondary | ICD-10-CM

## 2012-05-14 DIAGNOSIS — N871 Moderate cervical dysplasia: Secondary | ICD-10-CM

## 2012-05-14 DIAGNOSIS — Z124 Encounter for screening for malignant neoplasm of cervix: Secondary | ICD-10-CM

## 2012-05-14 DIAGNOSIS — Z1151 Encounter for screening for human papillomavirus (HPV): Secondary | ICD-10-CM | POA: Insufficient documentation

## 2012-05-14 DIAGNOSIS — Z01419 Encounter for gynecological examination (general) (routine) without abnormal findings: Secondary | ICD-10-CM | POA: Insufficient documentation

## 2012-05-14 NOTE — Progress Notes (Signed)
[name] November 13, 1945 [id number]        66 y.o.  G4P4 for follow up exam.  Several issues that are below  Past medical history,surgical history, medications, allergies, family history and social history were all reviewed and documented in the EPIC chart. ROS:  Was performed and pertinent positives and negatives are included in the history.  Exam: Biomedical scientist Filed Vitals:   05/14/12 1418  BP: 116/70  Height: 5\' 5"  (1.651 m)  Weight: 186 lb (84.369 kg)   General appearance  Normal Skin grossly normal Head/Neck normal with no cervical or supraclavicular adenopathy thyroid normal. No evidence of facial weakness. Extremities without weakness or sensation changes. Lungs  clear Cardiac RR, without RMG Abdominal  soft, nontender, without masses, organomegaly or hernia Breasts  examined lying and sitting without masses, retractions, discharge or axillary adenopathy. Pelvic  Ext/BUS/vagina  normal with atrophic changes  Cervix  Flush with the upper vagina.  Pap/HPV done  Uterus  axial, normal size, shape and contour, midline and mobile nontender   Adnexa  Without masses or tenderness    Anus and perineum  normal   Rectovaginal  normal sphincter tone without palpated masses or tenderness.    Assessment/Plan:  66 y.o. G53P4 female for follow up exam.   1. 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 Ectocervical margins with mild dysplasia. ECC was negative. Pap/HPV done today. If negative plan annual follow up. 2. Atrophic vaginal changes. Asymptomatic we'll continue to monitor. 3. Overdue for mammogram. She knows to schedule this and agrees to do so. SBE monthly reviewed. 4. Bone density. It's been a number of years since she has had a bone density. Recommended repeat now she wants to do at her primary physician's office has an appointment in September to more range then. Increase calcium vitamin D reviewed. 5. Colonoscopy up to date. 6. Tingling right  side of face and right arm for over a month.  Exam is normal without overt neurologic findings.  Saw her primary who did not know what was going on by patient history. Recommended neurology evaluation the patient agrees to follow up for this. 7. Health maintenance. No blood work was done today and she has an appointment to see her primary in September and will have this all done through their office. Follow up with me in one year assuming her Pap smear is normal, otherwise we'll triage based on results.    Dara Lords MD, 2:45 PM 05/14/2012

## 2012-05-14 NOTE — Patient Instructions (Addendum)
Follow up with neurologist as scheduled. Follow up with her primary physician's office for annual blood work and exam as needed. Schedule bone density through your primary physician's office. Assuming your Pap smear is normal then follow up in one year for repeat Pap smear.

## 2012-05-15 ENCOUNTER — Telehealth: Payer: Self-pay | Admitting: *Deleted

## 2012-05-15 DIAGNOSIS — R200 Anesthesia of skin: Secondary | ICD-10-CM

## 2012-05-15 NOTE — Telephone Encounter (Signed)
Referral order placed.

## 2012-05-15 NOTE — Telephone Encounter (Signed)
Pt informed for appointment 05/16/12 @ 10:15 am with Dr.Lewitt, office notes faxed. # giving to reschedule

## 2012-05-15 NOTE — Telephone Encounter (Signed)
Message copied by Aura Camps on Tue May 15, 2012  9:12 AM ------      Message from: Mckinley Jewel, AMY L      Created: Mon May 14, 2012  4:04 PM                   ----- Message -----         From: Dara Lords, MD         Sent: 05/14/2012   2:53 PM           To: Amy Duwaine Maxin, CNA            Schedule an appointment with Dr. Vela Prose, neurologist in reference to right face and right arm tingling

## 2012-05-15 NOTE — Addendum Note (Signed)
Addended by: Aura Camps on: 05/15/2012 10:45 AM   Modules accepted: Orders

## 2012-05-21 ENCOUNTER — Telehealth: Payer: Self-pay | Admitting: *Deleted

## 2012-05-21 NOTE — Telephone Encounter (Signed)
Pt informed of normal recent pap results. 

## 2012-06-26 ENCOUNTER — Other Ambulatory Visit: Payer: Self-pay | Admitting: Neurology

## 2012-06-26 DIAGNOSIS — R200 Anesthesia of skin: Secondary | ICD-10-CM

## 2012-06-29 ENCOUNTER — Other Ambulatory Visit: Payer: Medicare Other

## 2012-06-30 ENCOUNTER — Ambulatory Visit
Admission: RE | Admit: 2012-06-30 | Discharge: 2012-06-30 | Disposition: A | Discharge status: Home / Self Care | Payer: Medicare Other | Source: Ambulatory Visit | Attending: Neurology | Admitting: Neurology

## 2012-06-30 DIAGNOSIS — R200 Anesthesia of skin: Secondary | ICD-10-CM

## 2012-11-26 ENCOUNTER — Encounter (HOSPITAL_COMMUNITY): Payer: Self-pay | Admitting: *Deleted

## 2012-11-26 ENCOUNTER — Observation Stay (HOSPITAL_COMMUNITY)
Admission: EM | Admit: 2012-11-26 | Discharge: 2012-11-27 | Disposition: A | Discharge status: Home / Self Care | Payer: Medicare Other | Attending: Internal Medicine | Admitting: Internal Medicine

## 2012-11-26 ENCOUNTER — Emergency Department (HOSPITAL_COMMUNITY): Payer: Medicare Other

## 2012-11-26 DIAGNOSIS — Z23 Encounter for immunization: Secondary | ICD-10-CM | POA: Insufficient documentation

## 2012-11-26 DIAGNOSIS — F17200 Nicotine dependence, unspecified, uncomplicated: Secondary | ICD-10-CM | POA: Insufficient documentation

## 2012-11-26 DIAGNOSIS — R0789 Other chest pain: Secondary | ICD-10-CM | POA: Insufficient documentation

## 2012-11-26 DIAGNOSIS — E876 Hypokalemia: Secondary | ICD-10-CM

## 2012-11-26 DIAGNOSIS — R0602 Shortness of breath: Secondary | ICD-10-CM | POA: Insufficient documentation

## 2012-11-26 DIAGNOSIS — I10 Essential (primary) hypertension: Secondary | ICD-10-CM | POA: Insufficient documentation

## 2012-11-26 DIAGNOSIS — F411 Generalized anxiety disorder: Secondary | ICD-10-CM

## 2012-11-26 DIAGNOSIS — K529 Noninfective gastroenteritis and colitis, unspecified: Secondary | ICD-10-CM

## 2012-11-26 DIAGNOSIS — J209 Acute bronchitis, unspecified: Secondary | ICD-10-CM | POA: Insufficient documentation

## 2012-11-26 DIAGNOSIS — K311 Adult hypertrophic pyloric stenosis: Secondary | ICD-10-CM

## 2012-11-26 DIAGNOSIS — K219 Gastro-esophageal reflux disease without esophagitis: Secondary | ICD-10-CM

## 2012-11-26 DIAGNOSIS — J441 Chronic obstructive pulmonary disease with (acute) exacerbation: Principal | ICD-10-CM | POA: Insufficient documentation

## 2012-11-26 DIAGNOSIS — G8929 Other chronic pain: Secondary | ICD-10-CM

## 2012-11-26 DIAGNOSIS — J449 Chronic obstructive pulmonary disease, unspecified: Secondary | ICD-10-CM | POA: Diagnosis present

## 2012-11-26 DIAGNOSIS — F419 Anxiety disorder, unspecified: Secondary | ICD-10-CM | POA: Diagnosis present

## 2012-11-26 DIAGNOSIS — M545 Low back pain: Secondary | ICD-10-CM | POA: Diagnosis present

## 2012-11-26 DIAGNOSIS — K279 Peptic ulcer, site unspecified, unspecified as acute or chronic, without hemorrhage or perforation: Secondary | ICD-10-CM

## 2012-11-26 DIAGNOSIS — R1013 Epigastric pain: Secondary | ICD-10-CM

## 2012-11-26 HISTORY — DX: Chronic obstructive pulmonary disease, unspecified: J44.9

## 2012-11-26 LAB — URINALYSIS, ROUTINE W REFLEX MICROSCOPIC
Bilirubin Urine: NEGATIVE
Nitrite: POSITIVE — AB
Specific Gravity, Urine: <1.005 — ABNORMAL LOW (ref 1.005–1.030)
pH: 5.5 (ref 5.0–8.0)

## 2012-11-26 LAB — BASIC METABOLIC PANEL
BUN: 7 mg/dL (ref 6–23)
CO2: 28 mEq/L (ref 19–32)
Chloride: 99 mEq/L (ref 96–112)
Creatinine, Ser: 0.53 mg/dL (ref 0.50–1.10)
Glucose, Bld: 139 mg/dL — ABNORMAL HIGH (ref 70–99)

## 2012-11-26 LAB — TROPONIN I: Troponin I: <0.3 ng/mL (ref <0.30)

## 2012-11-26 LAB — CBC WITH DIFFERENTIAL/PLATELET
Basophils Absolute: 0.1 10*3/uL (ref 0.0–0.1)
HCT: 41.6 % (ref 36.0–46.0)
Hemoglobin: 13.8 g/dL (ref 12.0–15.0)
Lymphocytes Relative: 36 % (ref 12–46)
Lymphs Abs: 3.5 10*3/uL (ref 0.7–4.0)
Monocytes Absolute: 0.5 10*3/uL (ref 0.1–1.0)
Monocytes Relative: 5 % (ref 3–12)
Neutro Abs: 5.6 10*3/uL (ref 1.7–7.7)
RBC: 4.8 MIL/uL (ref 3.87–5.11)
WBC: 9.8 10*3/uL (ref 4.0–10.5)

## 2012-11-26 LAB — URINE MICROSCOPIC-ADD ON

## 2012-11-26 LAB — MAGNESIUM: Magnesium: 1.6 mg/dL (ref 1.5–2.5)

## 2012-11-26 MED ORDER — ONDANSETRON HCL 4 MG PO TABS: 4.0000 mg | ORAL_TABLET | Freq: Four times a day (QID) | ORAL | Status: DC | PRN

## 2012-11-26 MED ORDER — LISINOPRIL 10 MG PO TABS
10.0000 mg | ORAL_TABLET | Freq: Every day | ORAL | Status: DC
  Administered 2012-11-27: 10 mg via ORAL
  Filled 2012-11-26: qty 1

## 2012-11-26 MED ORDER — PANTOPRAZOLE SODIUM 40 MG PO TBEC
40.0000 mg | DELAYED_RELEASE_TABLET | Freq: Every day | ORAL | Status: DC
  Administered 2012-11-27: 40 mg via ORAL
  Filled 2012-11-26 (×2): qty 1

## 2012-11-26 MED ORDER — SIMVASTATIN 20 MG PO TABS
40.0000 mg | ORAL_TABLET | Freq: Every day | ORAL | Status: DC
Start: 2012-11-27 — End: 2012-11-27

## 2012-11-26 MED ORDER — HEPARIN SODIUM (PORCINE) 5000 UNIT/ML IJ SOLN
5000.0000 [IU] | Freq: Three times a day (TID) | INTRAMUSCULAR | Status: DC
  Administered 2012-11-26 – 2012-11-27 (×2): 5000 [IU] via SUBCUTANEOUS
  Filled 2012-11-26 (×2): qty 1

## 2012-11-26 MED ORDER — ALBUTEROL SULFATE (5 MG/ML) 0.5% IN NEBU
2.5000 mg | INHALATION_SOLUTION | RESPIRATORY_TRACT | Status: DC
  Administered 2012-11-26: 2.5 mg via RESPIRATORY_TRACT
  Filled 2012-11-26: qty 0.5

## 2012-11-26 MED ORDER — LEVOFLOXACIN IN D5W 500 MG/100ML IV SOLN
500.0000 mg | Freq: Once | INTRAVENOUS | Status: AC
  Administered 2012-11-26: 500 mg via INTRAVENOUS
  Filled 2012-11-26: qty 100

## 2012-11-26 MED ORDER — MAGNESIUM HYDROXIDE 400 MG/5ML PO SUSP: 30.0000 mL | Freq: Every day | ORAL | Status: DC | PRN

## 2012-11-26 MED ORDER — NICOTINE 21 MG/24HR TD PT24
21.0000 mg | MEDICATED_PATCH | Freq: Every day | TRANSDERMAL | Status: DC
  Administered 2012-11-26: 21 mg via TRANSDERMAL
  Filled 2012-11-26: qty 1

## 2012-11-26 MED ORDER — SODIUM CHLORIDE 0.9 % IJ SOLN
3.0000 mL | Freq: Two times a day (BID) | INTRAMUSCULAR | Status: DC
  Administered 2012-11-26: 3 mL via INTRAVENOUS

## 2012-11-26 MED ORDER — ALBUTEROL SULFATE (5 MG/ML) 0.5% IN NEBU
2.5000 mg | INHALATION_SOLUTION | RESPIRATORY_TRACT | Status: DC
  Administered 2012-11-27 (×3): 2.5 mg via RESPIRATORY_TRACT
  Filled 2012-11-26 (×3): qty 0.5

## 2012-11-26 MED ORDER — HYDROCODONE-ACETAMINOPHEN 10-325 MG PO TABS
1.0000 | ORAL_TABLET | Freq: Four times a day (QID) | ORAL | Status: DC | PRN
  Administered 2012-11-26 – 2012-11-27 (×3): 1 via ORAL
  Filled 2012-11-26 (×3): qty 1

## 2012-11-26 MED ORDER — PAROXETINE HCL 20 MG PO TABS
20.0000 mg | ORAL_TABLET | Freq: Every day | ORAL | Status: DC
Start: 2012-11-27 — End: 2012-11-27
  Administered 2012-11-27: 20 mg via ORAL
  Filled 2012-11-26: qty 1

## 2012-11-26 MED ORDER — ALPRAZOLAM 0.5 MG PO TABS
0.5000 mg | ORAL_TABLET | Freq: Three times a day (TID) | ORAL | Status: DC | PRN
  Administered 2012-11-27 (×2): 0.5 mg via ORAL
  Filled 2012-11-26 (×2): qty 1

## 2012-11-26 MED ORDER — POTASSIUM CHLORIDE 10 MEQ/100ML IV SOLN
10.0000 meq | Freq: Once | INTRAVENOUS | Status: AC
  Administered 2012-11-26: 10 meq via INTRAVENOUS
  Filled 2012-11-26: qty 100

## 2012-11-26 MED ORDER — INFLUENZA VIRUS VACC SPLIT PF IM SUSP
0.5000 mL | INTRAMUSCULAR | Status: AC
  Administered 2012-11-27: 0.5 mL via INTRAMUSCULAR
  Filled 2012-11-26: qty 0.5

## 2012-11-26 MED ORDER — POTASSIUM CHLORIDE 20 MEQ/15ML (10%) PO LIQD
40.0000 meq | Freq: Once | ORAL | Status: AC
  Administered 2012-11-26: 40 meq via ORAL
  Filled 2012-11-26: qty 30

## 2012-11-26 MED ORDER — GUAIFENESIN ER 600 MG PO TB12
600.0000 mg | ORAL_TABLET | Freq: Two times a day (BID) | ORAL | Status: DC
  Administered 2012-11-26 – 2012-11-27 (×2): 600 mg via ORAL
  Filled 2012-11-26 (×2): qty 1

## 2012-11-26 MED ORDER — SODIUM CHLORIDE 0.9 % IV SOLN: INTRAVENOUS | Status: DC

## 2012-11-26 MED ORDER — POTASSIUM CHLORIDE CRYS ER 20 MEQ PO TBCR
40.0000 meq | EXTENDED_RELEASE_TABLET | Freq: Two times a day (BID) | ORAL | Status: DC
  Administered 2012-11-26 – 2012-11-27 (×2): 40 meq via ORAL
  Filled 2012-11-26 (×2): qty 2

## 2012-11-26 MED ORDER — CALCIUM CARBONATE 1250 (500 CA) MG PO TABS
1250.0000 mg | ORAL_TABLET | Freq: Every day | ORAL | Status: DC
  Administered 2012-11-27: 1250 mg via ORAL
  Filled 2012-11-26: qty 2
  Filled 2012-11-26: qty 3

## 2012-11-26 MED ORDER — ADULT MULTIVITAMIN W/MINERALS CH
1.0000 | ORAL_TABLET | Freq: Every day | ORAL | Status: DC
  Administered 2012-11-26 – 2012-11-27 (×2): 1 via ORAL
  Filled 2012-11-26 (×2): qty 1

## 2012-11-26 MED ORDER — HYDROCODONE-ACETAMINOPHEN 5-325 MG PO TABS
1.0000 | ORAL_TABLET | Freq: Once | ORAL | Status: AC
  Administered 2012-11-26: 1 via ORAL
  Filled 2012-11-26: qty 1

## 2012-11-26 MED ORDER — METHYLPREDNISOLONE SODIUM SUCC 125 MG IJ SOLR
125.0000 mg | Freq: Once | INTRAMUSCULAR | Status: AC
  Administered 2012-11-26: 125 mg via INTRAVENOUS
  Filled 2012-11-26: qty 2

## 2012-11-26 MED ORDER — ALPRAZOLAM 0.5 MG PO TABS
0.5000 mg | ORAL_TABLET | Freq: Once | ORAL | Status: AC
  Administered 2012-11-26: 0.5 mg via ORAL
  Filled 2012-11-26: qty 1

## 2012-11-26 MED ORDER — ALBUTEROL (5 MG/ML) CONTINUOUS INHALATION SOLN
15.0000 mg | INHALATION_SOLUTION | Freq: Once | RESPIRATORY_TRACT | Status: AC
  Administered 2012-11-26: 15 mg via RESPIRATORY_TRACT
  Filled 2012-11-26: qty 20

## 2012-11-26 MED ORDER — PREDNISONE 20 MG PO TABS
40.0000 mg | ORAL_TABLET | Freq: Every day | ORAL | Status: DC
  Administered 2012-11-27: 40 mg via ORAL
  Filled 2012-11-26: qty 2

## 2012-11-26 MED ORDER — ONDANSETRON HCL 4 MG/2ML IJ SOLN: 4.0000 mg | Freq: Four times a day (QID) | INTRAMUSCULAR | Status: DC | PRN

## 2012-11-26 MED ORDER — DICYCLOMINE HCL 10 MG PO CAPS: 10.0000 mg | ORAL_CAPSULE | Freq: Four times a day (QID) | ORAL | Status: DC | PRN

## 2012-11-26 MED ORDER — IPRATROPIUM BROMIDE 0.02 % IN SOLN
0.5000 mg | RESPIRATORY_TRACT | Status: DC
  Administered 2012-11-27 (×3): 0.5 mg via RESPIRATORY_TRACT
  Filled 2012-11-26 (×3): qty 2.5

## 2012-11-26 MED ORDER — DOCUSATE SODIUM 100 MG PO CAPS
100.0000 mg | ORAL_CAPSULE | Freq: Two times a day (BID) | ORAL | Status: DC
  Administered 2012-11-26 – 2012-11-27 (×2): 100 mg via ORAL
  Filled 2012-11-26 (×2): qty 1

## 2012-11-26 MED ORDER — DIPHENHYDRAMINE HCL 25 MG PO CAPS
25.0000 mg | ORAL_CAPSULE | Freq: Two times a day (BID) | ORAL | Status: DC | PRN
  Filled 2012-11-26: qty 1

## 2012-11-26 MED ORDER — ACETAMINOPHEN 650 MG RE SUPP: 650.0000 mg | Freq: Four times a day (QID) | RECTAL | Status: DC | PRN

## 2012-11-26 MED ORDER — LEVOFLOXACIN 500 MG PO TABS
500.0000 mg | ORAL_TABLET | Freq: Every day | ORAL | Status: DC
  Administered 2012-11-27: 500 mg via ORAL
  Filled 2012-11-26: qty 1

## 2012-11-26 MED ORDER — IPRATROPIUM BROMIDE 0.02 % IN SOLN
0.5000 mg | RESPIRATORY_TRACT | Status: DC
  Administered 2012-11-26: 0.5 mg via RESPIRATORY_TRACT
  Filled 2012-11-26: qty 2.5

## 2012-11-26 MED ORDER — IPRATROPIUM BROMIDE 0.02 % IN SOLN
1.0000 mg | Freq: Once | RESPIRATORY_TRACT | Status: AC
  Administered 2012-11-26: 1 mg via RESPIRATORY_TRACT
  Filled 2012-11-26: qty 5

## 2012-11-26 MED ORDER — ACETAMINOPHEN 325 MG PO TABS: 650.0000 mg | ORAL_TABLET | Freq: Four times a day (QID) | ORAL | Status: DC | PRN

## 2012-11-26 MED ORDER — PNEUMOCOCCAL VAC POLYVALENT 25 MCG/0.5ML IJ INJ
0.5000 mL | INJECTION | INTRAMUSCULAR | Status: AC
  Administered 2012-11-27: 0.5 mL via INTRAMUSCULAR
  Filled 2012-11-26: qty 0.5

## 2012-11-26 NOTE — H&P (Signed)
Hospital Admission Note Date: 11/26/2012  Patient name: [name] Medical record number: [id number] Date of birth: [birth year] Age: 67 y.o. Gender: female PCP: Bennie Pierini, FNP  Attending physician: Christiane Ha, MD  Chief Complaint: shortness of breath  History of Present Illness:  [name] is an 67 y.o. female who was sent to the ED from PCP office for shortness of breath. She's had "bronchitis" for 3 weeks and was self treating with over-the-counter medications but was not getting better. She had an outpatient chest x-ray that showed no infiltrate. She received steroids and bronchodilators and felt better, but reportedly became very winded and dizzy when ambulating, so the hospitalists were called to the admit. She continues to smoke a pack of cigarettes a day. She used to smoke even more. She's had rhinorrhea. Postnasal drip. No fevers or chills. She wishes to quit smoking but has been unsuccessful previously. Denies urinary frequency or dysuria. Patient does not feel well on off to manage at home, and has early been told by the ED physician she would be admitted overnight. Patient is requesting hydrocodone and Xanax.  Past Medical History  Diagnosis Date  . GERD (gastroesophageal reflux disease)   . HTN (hypertension)   . Hypercholesterolemia   . Depression   . Anxiety   . S/P colonoscopy 05/30/2008    Dr. Arlyce Dice: (records reviewed by AS, NP) findings normal to cecum, scattered diverticula in sigmoid colon   . S/P endoscopy 05/15/2008    Dr. Arlyce Dice: (records reviewed by AS,NP): [facility] dilator for stricture distal esophagus, also noted duodenal stricture, op notes state unable to pass 10mm scope through pylorus  . Hypercholesteremia   . IBS (irritable bowel syndrome)   . Complication of anesthesia   . PONV (postoperative nausea and vomiting)   . HPV in female   . Angina     with indigestion  . Shortness of breath     with exertion  . Headache      normal h/a  . Arthritis     all over esp. in joints  . Pneumonia 2007  . Stomach ulcer   . COPD (chronic obstructive pulmonary disease)     Meds: See home medication list  Allergies: Codeine and Meloxicam History   Social History  . Marital Status: Married    Spouse Name: N/A    Number of Children: 5  . Years of Education: N/A   Occupational History  . disability    Social History Main Topics  . Smoking status: Current Every Day Smoker -- 1.00 packs/day for 50 years    Types: Cigarettes  . Smokeless tobacco: Not on file     Comment: since teenager  . Alcohol Use: No     Comment: hx of ETOH abuse in past, none in 5 years  . Drug Use: No  . Sexually Active: No   Other Topics Concern  . Not on file   Social History Narrative  . No narrative on file   Family History  Problem Relation Age of Onset  . Colon cancer Neg Hx   . Anesthesia problems Neg Hx   . Hypotension Neg Hx   . Malignant hyperthermia Neg Hx   . Pseudochol deficiency Neg Hx   . Heart disease Father     CHF  . Diabetes Brother     type 2   Past Surgical History  Procedure Laterality Date  . Tumor removed from arm    . Carpal tunnel release  right hand  . Back surgery      X 2  . Appendectomy      67 years old  . Cholecystectomy  1984  . Tubal ligation  1976  . Maloney dilation  07/28/2011    Schatzki's ring s/p 38F with small UES tear as well, small hh, 1cm pyloric channel ulcer, bx benign without H.Pylori. Mobic/goody's at time.  . Esophagogastroduodenoscopy  12/01/2011    Hiatal hernia/Mild fibrotic pyloric stenosis status post dilation with passage of  the scope.  The previously noted gastric ulcer completely healed  The remainder of the gastric mucosa appeared normal  . Cervical biopsy  w/ loop electrode excision  02/2011    CIN-2 with focal ectocervical margin involvement. With CIN-1 ECC negative    Review of Systems: Systems reviewed and as per HPI, otherwise  negative.  Physical Exam: Blood pressure 150/70, pulse 92, temperature 97.8 F (36.6 C), temperature source Oral, resp. rate 20, SpO2 95.00%. BP 150/70  Pulse 92  Temp(Src) 97.8 F (36.6 C) (Oral)  Resp 20  SpO2 95%  General Appearance:    Alert, cooperative, no distress, appears stated age  Head:    Normocephalic, without obvious abnormality, atraumatic  Eyes:    PERRL, conjunctiva/corneas clear, EOM's intact, fundi    benign, both eyes  Ears:    Normal TM's and external ear canals, both ears  Nose:   boggy turbinates with clear drainage   Throat:   Lips, mucosa, and tongue normal; teeth and gums normal  Neck:   Supple, symmetrical, trachea midline, no adenopathy;    thyroid:  no enlargement/tenderness/nodules; no carotid   bruit or JVD  Back:     Symmetric, no curvature, ROM normal, no CVA tenderness  Lungs:     Clear to auscultation bilaterally, respirations unlabored  Chest Wall:    No tenderness or deformity   Heart:    Regular rate and rhythm, S1 and S2 normal, no murmur, rub   or gallop     Abdomen:     Soft, non-tender, bowel sounds active all four quadrants,    no masses, no organomegaly  Genitalia:    Normal female without lesion, discharge or tenderness  Rectal:    Normal tone, normal prostate, no masses or tenderness;   guaiac negative stool  Extremities:   Extremities normal, atraumatic, no cyanosis or edema  Pulses:   2+ and symmetric all extremities  Skin:   Skin color, texture, turgor normal, no rashes or lesions  Lymph nodes:   Cervical, supraclavicular, and axillary nodes normal  Neurologic:   CNII-XII intact, normal strength, sensation and reflexes    throughout    Psychiatric: Normal affect. Calm and cooperative.  Lab results: Basic Metabolic Panel:  Recent Labs  40/98/11 1111  NA 138  K 2.9*  CL 99  CO2 28  GLUCOSE 139*  BUN 7  CREATININE 0.53  CALCIUM 9.5   Liver Function Tests: No results found for this basename: AST, ALT, ALKPHOS,  BILITOT, PROT, ALBUMIN,  in the last 72 hours No results found for this basename: LIPASE, AMYLASE,  in the last 72 hours No results found for this basename: AMMONIA,  in the last 72 hours CBC:  Recent Labs  11/26/12 1111  WBC 9.8  NEUTROABS 5.6  HGB 13.8  HCT 41.6  MCV 86.7  PLT 252   Cardiac Enzymes:  Recent Labs  11/26/12 1111  TROPONINI <0.30  Urinalysis:  Recent Labs  11/26/12 1211  COLORURINE YELLOW  LABSPEC <1.005*  PHURINE 5.5  GLUCOSEU NEGATIVE  HGBUR NEGATIVE  BILIRUBINUR NEGATIVE  KETONESUR NEGATIVE  PROTEINUR NEGATIVE  UROBILINOGEN 0.2  NITRITE POSITIVE*  LEUKOCYTESUR NEGATIVE    Imaging results:  No results found.  Assessment & Plan: Active Problems:   Acute bronchitis   COPD exacerbation   Benign hypertension   Smokes tobacco daily   Anxiety   Hypokalemia   Chronic lower back pain  Patient clinically really doesn't look too bad off, but reportedly had severe dyspnea and dizziness on exertion. We'll monitor her overnight on telemetry. Replete potassium. Repeat labs in the morning. Continue levofloxacin for acute bronchitis, prednisone and bronchodilators. She is to quit smoking.  Coltrane Tugwell L 11/26/2012, 5:25 PM

## 2012-11-26 NOTE — ED Notes (Signed)
Chest pain , weakness, sob, sick with cough for several days.  Sent from MD office for evaluation.

## 2012-11-26 NOTE — ED Provider Notes (Signed)
History     CSN: [id number]  Arrival date & time 11/26/12  1049   First MD Initiated Contact with Patient 11/26/12 1132      Chief Complaint  Patient presents with  . Chest Pain  . Shortness of Breath  . Cough    HPI Pt was seen at 1150.   Per pt, c/o gradual onset and worsening of persistent cough, wheezing and SOB for the past 3 to 4 days. Has been associated with generalized weakness/fatigue as well as constant generalized chest "tightness."  States she was eval by her PMD PTA, and sent to the ED for further eval. Denies CP/palpitations, no back pain, no abd pain, no N/V/D, no fevers, no rash.     Past Medical History  Diagnosis Date  . GERD (gastroesophageal reflux disease)   . HTN (hypertension)   . Hypercholesterolemia   . Depression   . Anxiety   . S/P colonoscopy 05/30/2008    Dr. Arlyce Dice: (records reviewed by AS, NP) findings normal to cecum, scattered diverticula in sigmoid colon   . S/P endoscopy 05/15/2008    Dr. Arlyce Dice: (records reviewed by AS,NP): [facility] dilator for stricture distal esophagus, also noted duodenal stricture, op notes state unable to pass 10mm scope through pylorus  . Hypercholesteremia   . IBS (irritable bowel syndrome)   . Complication of anesthesia   . PONV (postoperative nausea and vomiting)   . HPV in female   . Angina     with indigestion  . Shortness of breath     with exertion  . Headache     normal h/a  . Arthritis     all over esp. in joints  . Pneumonia 2007  . Stomach ulcer   . COPD (chronic obstructive pulmonary disease)     Past Surgical History  Procedure Laterality Date  . Tumor removed from arm    . Carpal tunnel release      right hand  . Back surgery      X 2  . Appendectomy      67 years old  . Cholecystectomy  1984  . Tubal ligation  1976  . Maloney dilation  07/28/2011    Schatzki's ring s/p 72F with small UES tear as well, small hh, 1cm pyloric channel ulcer, bx benign without H.Pylori. Mobic/goody's at  time.  . Esophagogastroduodenoscopy  12/01/2011    Hiatal hernia/Mild fibrotic pyloric stenosis status post dilation with passage of  the scope.  The previously noted gastric ulcer completely healed  The remainder of the gastric mucosa appeared normal  . Cervical biopsy  w/ loop electrode excision  02/2011    CIN-2 with focal ectocervical margin involvement. With CIN-1 ECC negative    Family History  Problem Relation Age of Onset  . Colon cancer Neg Hx   . Anesthesia problems Neg Hx   . Hypotension Neg Hx   . Malignant hyperthermia Neg Hx   . Pseudochol deficiency Neg Hx   . Heart disease Father     CHF  . Diabetes Brother     type 2    History  Substance Use Topics  . Smoking status: Current Every Day Smoker -- 1.00 packs/day for 50 years    Types: Cigarettes  . Smokeless tobacco: Not on file     Comment: since teenager  . Alcohol Use: No     Comment: hx of ETOH abuse in past, none in 5 years    OB History   Grav Para Term  Preterm Abortions TAB SAB Ect Mult Living   4 4        5       Review of Systems ROS: Statement: All systems negative except as marked or noted in the HPI; Constitutional: Negative for fever and chills. +generalized weakness. ; ; Eyes: Negative for eye pain, redness and discharge. ; ; ENMT: Negative for ear pain, hoarseness, nasal congestion, sinus pressure and sore throat. ; ; Cardiovascular: +CP. Negative for palpitations, diaphoresis, and peripheral edema. ; ; Respiratory: +cough, wheezing, SOB. Negative for stridor. ; ; Gastrointestinal: Negative for nausea, vomiting, diarrhea, abdominal pain, blood in stool, hematemesis, jaundice and rectal bleeding. . ; ; Genitourinary: Negative for dysuria, flank pain and hematuria. ; ; Musculoskeletal: Negative for back pain and neck pain. Negative for swelling and trauma.; ; Skin: Negative for pruritus, rash, abrasions, blisters, bruising and skin lesion.; ; Neuro: Negative for headache, lightheadedness and neck  stiffness. Negative for weakness, altered level of consciousness , altered mental status, extremity weakness, paresthesias, involuntary movement, seizure and syncope.       Allergies  Codeine and Meloxicam  Home Medications   Current Outpatient Rx  Name  Route  Sig  Dispense  Refill  . ALPRAZolam (XANAX) 0.5 MG tablet   Oral   Take 0.5 mg by mouth 3 (three) times daily as needed. Anxiety         . calcium carbonate (OS-CAL) 600 MG TABS   Oral   Take 600 mg by mouth daily.          . Cholecalciferol (VITAMIN D3) 1000 UNITS CAPS   Oral   Take 3,000 mg by mouth daily.          Marland Kitchen dicyclomine (BENTYL) 10 MG capsule   Oral   Take 10 mg by mouth 4 (four) times daily as needed (Irritable bowl syndrome).         Marland Kitchen diphenhydrAMINE (BENADRYL) 25 MG tablet   Oral   Take 25 mg by mouth 2 (two) times daily as needed. For Allergies         . HYDROcodone-acetaminophen (NORCO) 10-325 MG per tablet   Oral   Take 1 tablet by mouth every 6 (six) hours as needed. For Pain         . lisinopril-hydrochlorothiazide (PRINZIDE,ZESTORETIC) 10-12.5 MG per tablet   Oral   Take 1 tablet by mouth daily.           . Multiple Vitamin (MULITIVITAMIN WITH MINERALS) TABS   Oral   Take 1 tablet by mouth daily.         . Omega-3 Fatty Acids (FISH OIL) 1200 MG CAPS   Oral   Take 2,400 capsules by mouth daily.          . pantoprazole (PROTONIX) 40 MG tablet   Oral   Take 1 tablet (40 mg total) by mouth daily.   30 tablet   11   . PARoxetine (PAXIL) 20 MG tablet   Oral   Take 20 mg by mouth every morning.           . simvastatin (ZOCOR) 40 MG tablet   Oral   Take 40 mg by mouth every morning.          . vitamin C (ASCORBIC ACID) 500 MG tablet   Oral   Take 1,000 mg by mouth daily.            BP 127/81  Pulse 77  Temp(Src) 97.8 F (36.6 C) (Oral)  Resp 21  SpO2 94%  Physical Exam 1155: Physical examination:  Nursing notes reviewed; Vital signs and O2 SAT  reviewed;  Constitutional: Well developed, Well nourished, Well hydrated, In no acute distress; Head:  Normocephalic, atraumatic; Eyes: EOMI, PERRL, No scleral icterus; ENMT: Mouth and pharynx normal, Mucous membranes moist; Neck: Supple, Full range of motion, No lymphadenopathy; Cardiovascular: Regular rate and rhythm, No gallop; Respiratory: Breath sounds coarse & equal bilaterally, insp/exp wheezes bilat, +audible wheezing. Speaking in short phrases. +access mm use, sitting upright. ; Chest: Nontender, Movement normal; Abdomen: Soft, Nontender, Nondistended, Normal bowel sounds; Genitourinary: No CVA tenderness; Extremities: Pulses normal, No tenderness, No edema, No calf edema or asymmetry.; Neuro: AA&Ox3, Major CN grossly intact.  Speech clear. Climbs on and off stretcher by herself. No gross focal motor or sensory deficits in extremities.; Skin: Color normal, Warm, Dry.   ED Course  Procedures  1200:  Pt with wheezing bilat, Sats 94% R/A during my exam.  Will start continuous neb and given IV solumedrol for possible exacerbation COPD.  1500:  Potassium repleted PO and IV while in the ED.  Pt states she feels "much better now" and wants to go home.  VS remain stable. Sats 96% R/A while sitting on stretcher, lungs CTA bilat without wheezing, speaking full sentences with ease.  Doubt ACS as cause for CP, with constant CP x3 days and normal troponin and unchanged EKG.  Doubt PE with low risk Wells.  Will ambulate pt.    1530:  Pt ambulated around the ED with Sats dropping to 90% R/A, with increasing resp distress and visibly SOB, unsteady gait.  Pt escorted back to stretcher due to feeling "lightheaded," SBP dropped to 96, increased back to 140's after sitting.  Sats while sitting on stretcher 93-94% R/A, lungs coarse with scattered exp wheezes. No audible wheezing. Dx and testing d/w pt and family.  Questions answered.  Verb understanding, agreeable to admit.  1615:  T/C to [facility] Dr. Lendell Caprice, case  discussed, including:  HPI, pertinent PM/SHx, VS/PE, dx testing, ED course and treatment:  Agreeable to observation admit, requests to write temporary orders, obtain tele bed to team 1.   MDM  MDM Reviewed: previous chart, nursing note and vitals Reviewed previous: labs, ECG and x-ray Interpretation: labs and ECG Total time providing critical care: 30-74 minutes. This excludes time spent performing separately reportable procedures and services. Consults: admitting MD   CRITICAL CARE Performed by: Laray Anger Total critical care time: 40 Critical care time was exclusive of separately billable procedures and treating other patients. Critical care was necessary to treat or prevent imminent or life-threatening deterioration. Critical care was time spent personally by me on the following activities: development of treatment plan with patient and/or surrogate as well as nursing, discussions with consultants, evaluation of patient's response to treatment, examination of patient, obtaining history from patient or surrogate, ordering and performing treatments and interventions, ordering and review of laboratory studies, ordering and review of radiographic studies, pulse oximetry and re-evaluation of patient's condition.    Date: 11/26/2012  Rate: 73  Rhythm: normal sinus rhythm, baseline wander  QRS Axis: left  Intervals: normal  ST/T Wave abnormalities: nonspecific T wave changes  Conduction Disutrbances:nonspecific intraventricular conduction delay  Narrative Interpretation:   Old EKG Reviewed: unchanged; no significant changes from previous EKG dated 07/21/2011.  Results for orders placed during the hospital encounter of 11/26/12  CBC WITH DIFFERENTIAL      Result Value Range   WBC 9.8  4.0 -  10.5 K/uL   RBC 4.80  3.87 - 5.11 MIL/uL   Hemoglobin 13.8  12.0 - 15.0 g/dL   HCT 21.3  08.6 - 57.8 %   MCV 86.7  78.0 - 100.0 fL   MCH 28.8  26.0 - 34.0 pg   MCHC 33.2  30.0 - 36.0 g/dL    RDW 46.9  62.9 - 52.8 %   Platelets 252  150 - 400 K/uL   Neutrophils Relative 57  43 - 77 %   Neutro Abs 5.6  1.7 - 7.7 K/uL   Lymphocytes Relative 36  12 - 46 %   Lymphs Abs 3.5  0.7 - 4.0 K/uL   Monocytes Relative 5  3 - 12 %   Monocytes Absolute 0.5  0.1 - 1.0 K/uL   Eosinophils Relative 2  0 - 5 %   Eosinophils Absolute 0.2  0.0 - 0.7 K/uL   Basophils Relative 1  0 - 1 %   Basophils Absolute 0.1  0.0 - 0.1 K/uL  BASIC METABOLIC PANEL      Result Value Range   Sodium 138  135 - 145 mEq/L   Potassium 2.9 (*) 3.5 - 5.1 mEq/L   Chloride 99  96 - 112 mEq/L   CO2 28  19 - 32 mEq/L   Glucose, Bld 139 (*) 70 - 99 mg/dL   BUN 7  6 - 23 mg/dL   Creatinine, Ser 4.13  0.50 - 1.10 mg/dL   Calcium 9.5  8.4 - 24.4 mg/dL   GFR calc non Af Amer >90  >90 mL/min   GFR calc Af Amer >90  >90 mL/min  TROPONIN I      Result Value Range   Troponin I <0.30  <0.30 ng/mL  URINALYSIS, ROUTINE W REFLEX MICROSCOPIC      Result Value Range   Color, Urine YELLOW  YELLOW   APPearance CLEAR  CLEAR   Specific Gravity, Urine <1.005 (*) 1.005 - 1.030   pH 5.5  5.0 - 8.0   Glucose, UA NEGATIVE  NEGATIVE mg/dL   Hgb urine dipstick NEGATIVE  NEGATIVE   Bilirubin Urine NEGATIVE  NEGATIVE   Ketones, ur NEGATIVE  NEGATIVE mg/dL   Protein, ur NEGATIVE  NEGATIVE mg/dL   Urobilinogen, UA 0.2  0.0 - 1.0 mg/dL   Nitrite POSITIVE (*) NEGATIVE   Leukocytes, UA NEGATIVE  NEGATIVE  URINE MICROSCOPIC-ADD ON      Result Value Range   Squamous Epithelial / LPF RARE  RARE   WBC, UA 0-2  <3 WBC/hpf   RBC / HPF 0-2  <3 RBC/hpf    CXR completed at [facility] today PTA:   "Impression:  Slight hyperinflation. No acute cardiopulmonary disease. Charlett Nose, MD"        Laray Anger, DO 11/26/12 [phone]

## 2012-11-26 NOTE — ED Notes (Signed)
Pt was able to ambulate with one staff assist. Pt was very unsteady while walking. Pulse oximetry was checked while ambulating. Pt o2 dropped to 90% room air. Manual BP was obtained on pt. BP was 96/48.

## 2012-11-27 LAB — BASIC METABOLIC PANEL
CO2: 26 mEq/L (ref 19–32)
Calcium: 9.3 mg/dL (ref 8.4–10.5)
Chloride: 106 mEq/L (ref 96–112)
GFR calc Af Amer: >90 mL/min (ref >90)
Sodium: 139 mEq/L (ref 135–145)

## 2012-11-27 MED ORDER — PREDNISONE 20 MG PO TABS: ORAL_TABLET | ORAL | Status: DC

## 2012-11-27 MED ORDER — LEVOFLOXACIN 500 MG PO TABS: 500.0000 mg | ORAL_TABLET | Freq: Every day | ORAL | Status: DC

## 2012-11-27 NOTE — Discharge Summary (Signed)
Physician Discharge Summary  [name] RUE:[id number] DOB: 14-May-1946 DOA: 11/26/2012  PCP: Bennie Pierini, FNP  Admit date: 11/26/2012 Discharge date: 11/27/2012  Time spent: Greater than 30 minutes  Recommendations for Outpatient Follow-up:  1. Follow with primary care physician in the next week or so.   Discharge Diagnoses:  1. Acute COPD exacerbation. 2. Acute bronchitis, improving. 3. Hypertension. 4. Ongoing tobacco abuse,, counseled.   Discharge Condition: Stable and improved.  Diet recommendation: Regular.  Filed Weights   11/26/12 1757  Weight: 187 lb 13.3 oz (85.2 kg)    History of present illness:  This very pleasant 67 year old lady presents to the hospital with symptoms of dyspnea. Please see initial history as outlined below: [name] is an 67 y.o. female who was sent to the ED from PCP office for shortness of breath. She's had "bronchitis" for 3 weeks and was self treating with over-the-counter medications but was not getting better. She had an outpatient chest x-ray that showed no infiltrate. She received steroids and bronchodilators and felt better, but reportedly became very winded and dizzy when ambulating, so the hospitalists were called to the admit. She continues to smoke a pack of cigarettes a day. She used to smoke even more. She's had rhinorrhea. Postnasal drip. No fevers or chills. She wishes to quit smoking but has been unsuccessful previously. Denies urinary frequency or dysuria. Patient does not feel well on off to manage at home, and has early been told by the ED physician she would be admitted overnight. Patient is requesting hydrocodone and Xanax.  Hospital Course:  The patient was admitted and started on steroids. She improved very quickly and today she feels much improved. She has no wheezing or dyspnea. She has no fever. She is keen to go home. She was counseled regarding tobacco abuse. She'll go home with a tapering course of  prednisone and a further one week course of Levaquin. She is to follow with her primary care physician in the next week or so.  Procedures:  None.   Consultations:  None.  Discharge Exam: Filed Vitals:   11/27/12 0004 11/27/12 0409 11/27/12 0658 11/27/12 0743  BP:   144/77   Pulse:   75   Temp:   98.1 F (36.7 C)   TempSrc:   Oral   Resp:      Height:      Weight:      SpO2: 97% 97% 93% 98%    General: She looks systemically well. She is not toxic or septic. Cardiovascular: Heart sounds are present and normal without gallop rhythm. No murmurs. Respiratory: Lung fields are entirely clear. There is no wheezing, crackles or bronchial breathing She is alert and oriented.  Discharge Instructions  Discharge Orders   Future Appointments Provider Department Dept Phone   05/15/2013 2:00 PM Dara Lords, MD [facility] [phone]   Future Orders Complete By Expires     Diet - low sodium heart healthy  As directed     Increase activity slowly  As directed         Medication List    TAKE these medications       ALPRAZolam 0.5 MG tablet  Commonly known as:  XANAX  Take 0.5 mg by mouth 3 (three) times daily as needed. Anxiety     calcium carbonate 600 MG Tabs  Commonly known as:  OS-CAL  Take 600 mg by mouth daily.     dicyclomine 10 MG capsule  Commonly known  as:  BENTYL  Take 10 mg by mouth 4 (four) times daily as needed (Irritable bowl syndrome).     diphenhydrAMINE 25 MG tablet  Commonly known as:  BENADRYL  Take 25 mg by mouth 2 (two) times daily as needed. For Allergies     Fish Oil 1200 MG Caps  Take 2,400 capsules by mouth daily.     HYDROcodone-acetaminophen 10-325 MG per tablet  Commonly known as:  NORCO  Take 1 tablet by mouth every 6 (six) hours as needed. For Pain     levofloxacin 500 MG tablet  Commonly known as:  LEVAQUIN  Take 1 tablet (500 mg total) by mouth daily.     lisinopril-hydrochlorothiazide 10-12.5 MG per  tablet  Commonly known as:  PRINZIDE,ZESTORETIC  Take 1 tablet by mouth daily.     multivitamin with minerals Tabs  Take 1 tablet by mouth daily.     pantoprazole 40 MG tablet  Commonly known as:  PROTONIX  Take 1 tablet (40 mg total) by mouth daily.     PARoxetine 20 MG tablet  Commonly known as:  PAXIL  Take 20 mg by mouth every morning.     predniSONE 20 MG tablet  Commonly known as:  DELTASONE  Take 2 tablets daily for 3 days, then 1 tablet daily for 3 days, then half tablet daily for 3 days, then STOP.     simvastatin 40 MG tablet  Commonly known as:  ZOCOR  Take 40 mg by mouth every morning.     vitamin C 500 MG tablet  Commonly known as:  ASCORBIC ACID  Take 1,000 mg by mouth daily.     Vitamin D3 1000 UNITS Caps  Take 3,000 mg by mouth daily.          The results of significant diagnostics from this hospitalization (including imaging, microbiology, ancillary and laboratory) are listed below for reference.    Significant Diagnostic Studies: No results found.      Labs: Basic Metabolic Panel:  Recent Labs Lab 11/26/12 1111 11/26/12 1759 11/27/12 0602  NA 138  --  139  K 2.9*  --  3.9  CL 99  --  106  CO2 28  --  26  GLUCOSE 139*  --  123*  BUN 7  --  4*  CREATININE 0.53  --  0.43*  CALCIUM 9.5  --  9.3  MG  --  1.6  --        CBC:  Recent Labs Lab 11/26/12 1111  WBC 9.8  NEUTROABS 5.6  HGB 13.8  HCT 41.6  MCV 86.7  PLT 252   Cardiac Enzymes:  Recent Labs Lab 11/26/12 1111  TROPONINI <0.30        Signed:  Wilson Singer  [facility] 11/27/2012, 9:46 AM

## 2012-11-27 NOTE — Progress Notes (Signed)
IV removed, site WNL.  Pt given d/c instructions and new prescriptions.  Discussed home care with patient and discussed home medications, patient verbalizes understanding, teachback completed. F/U appointment to be made by patient, pt states they will make and keep appointment. Pt is stable at this time and very much desires to go home. Pt's husband in room. Pt refused wheelchair, ambulated to main entrance.

## 2012-11-27 NOTE — Progress Notes (Signed)
UR Chart Review Completed  

## 2012-11-29 LAB — URINE CULTURE

## 2012-12-13 ENCOUNTER — Other Ambulatory Visit: Payer: Self-pay | Admitting: Internal Medicine

## 2013-01-03 ENCOUNTER — Telehealth: Payer: Self-pay | Admitting: *Deleted

## 2013-01-03 ENCOUNTER — Other Ambulatory Visit: Payer: Self-pay | Admitting: *Deleted

## 2013-01-03 MED ORDER — HYDROCODONE-ACETAMINOPHEN 10-325 MG PO TABS: 1.0000 | ORAL_TABLET | Freq: Three times a day (TID) | ORAL | Status: DC | PRN

## 2013-01-03 NOTE — Telephone Encounter (Signed)
Patient notified that rx up front and ready for pick up 

## 2013-01-03 NOTE — Telephone Encounter (Signed)
Let patient know ready for pick-up

## 2013-01-03 NOTE — Telephone Encounter (Signed)
Last seen 12/05/12, last filled 11/06/12

## 2013-01-10 ENCOUNTER — Other Ambulatory Visit: Payer: Self-pay | Admitting: *Deleted

## 2013-01-10 MED ORDER — ALPRAZOLAM 0.5 MG PO TABS: 0.5000 mg | ORAL_TABLET | Freq: Three times a day (TID) | ORAL | Status: DC | PRN

## 2013-01-10 NOTE — Telephone Encounter (Signed)
Last filled 12/16/12!!!!!!

## 2013-01-10 NOTE — Telephone Encounter (Signed)
RX CALLED INTO [facility]

## 2013-01-10 NOTE — Telephone Encounter (Signed)
Please call in Xanax RX with no refills

## 2013-01-31 ENCOUNTER — Other Ambulatory Visit: Payer: Self-pay | Admitting: *Deleted

## 2013-01-31 MED ORDER — HYDROCODONE-ACETAMINOPHEN 10-325 MG PO TABS: 1.0000 | ORAL_TABLET | Freq: Three times a day (TID) | ORAL | Status: DC | PRN

## 2013-01-31 NOTE — Telephone Encounter (Signed)
LAST RF 01/03/13. PLEASE PRINT. SEE ALLERGIES

## 2013-02-12 ENCOUNTER — Other Ambulatory Visit: Payer: Self-pay | Admitting: *Deleted

## 2013-02-12 ENCOUNTER — Telehealth: Payer: Self-pay | Admitting: Nurse Practitioner

## 2013-02-12 MED ORDER — ALPRAZOLAM 0.5 MG PO TABS: 0.5000 mg | ORAL_TABLET | Freq: Three times a day (TID) | ORAL | Status: DC | PRN

## 2013-02-12 NOTE — Telephone Encounter (Signed)
Patient last seen in office on 12-05-12. Rx last filled on 01-13-13. Please advise. If approved please have nurse call in to [facility].

## 2013-02-12 NOTE — Telephone Encounter (Signed)
Please call in RX for xanax 

## 2013-02-13 NOTE — Telephone Encounter (Signed)
Mmm to address 

## 2013-02-13 NOTE — Telephone Encounter (Signed)
Called into pharmacy

## 2013-02-13 NOTE — Telephone Encounter (Signed)
Was called in 02/12/13

## 2013-02-27 ENCOUNTER — Other Ambulatory Visit: Payer: Self-pay | Admitting: Nurse Practitioner

## 2013-02-27 MED ORDER — HYDROCODONE-ACETAMINOPHEN 10-325 MG PO TABS: 1.0000 | ORAL_TABLET | Freq: Three times a day (TID) | ORAL | Status: DC | PRN

## 2013-03-07 ENCOUNTER — Other Ambulatory Visit: Payer: Self-pay | Admitting: Nurse Practitioner

## 2013-03-08 NOTE — Telephone Encounter (Signed)
LAST LABS 12/13. NTBS

## 2013-03-11 ENCOUNTER — Other Ambulatory Visit: Payer: Self-pay | Admitting: Nurse Practitioner

## 2013-03-13 ENCOUNTER — Other Ambulatory Visit: Payer: Self-pay | Admitting: *Deleted

## 2013-03-13 MED ORDER — ALPRAZOLAM 0.5 MG PO TABS: 0.5000 mg | ORAL_TABLET | Freq: Three times a day (TID) | ORAL | Status: DC | PRN

## 2013-03-13 NOTE — Telephone Encounter (Signed)
LAST OV 12/05/12. LAST RF 02/13/13. IF APPROVED CALL IN TO [facility].

## 2013-03-13 NOTE — Telephone Encounter (Signed)
Please call in rx for xanax with 2 refills 

## 2013-03-13 NOTE — Telephone Encounter (Signed)
RX called to vm. 

## 2013-03-18 ENCOUNTER — Telehealth: Payer: Self-pay

## 2013-03-18 NOTE — Telephone Encounter (Signed)
Pt drug store was out of the medication and it is on back order.I am going to call in a new Rx to [facility] in [place].

## 2013-03-18 NOTE — Telephone Encounter (Signed)
Agree 

## 2013-03-27 ENCOUNTER — Other Ambulatory Visit: Payer: Self-pay | Admitting: *Deleted

## 2013-03-27 MED ORDER — HYDROCODONE-ACETAMINOPHEN 10-325 MG PO TABS
1.0000 | ORAL_TABLET | Freq: Three times a day (TID) | ORAL | Status: DC | PRN
Start: 2013-03-27 — End: 2013-04-24

## 2013-03-27 NOTE — Telephone Encounter (Signed)
SEE CODEINE ALLERGY BUT JUST REFILLED IN MAY. THANKS.

## 2013-03-27 NOTE — Telephone Encounter (Signed)
rx ready for pickup 

## 2013-03-27 NOTE — Telephone Encounter (Signed)
LAST RF 02/28/13. LAST OV 12/05/12.

## 2013-03-27 NOTE — Telephone Encounter (Signed)
Pt aware.

## 2013-04-08 ENCOUNTER — Other Ambulatory Visit: Payer: Self-pay | Admitting: Nurse Practitioner

## 2013-04-10 NOTE — Telephone Encounter (Signed)
LAST LABS 12/13. NTBS 

## 2013-04-24 ENCOUNTER — Other Ambulatory Visit: Payer: Self-pay | Admitting: *Deleted

## 2013-04-24 NOTE — Telephone Encounter (Signed)
AST RF 03/27/13. PRINT AND NOTIFY PT IF APPROVED. LAST OV 3/14. SEE ALLERGIES

## 2013-04-25 ENCOUNTER — Telehealth: Payer: Self-pay | Admitting: Nurse Practitioner

## 2013-04-25 MED ORDER — HYDROCODONE-ACETAMINOPHEN 10-325 MG PO TABS: 1.0000 | ORAL_TABLET | Freq: Three times a day (TID) | ORAL | Status: DC | PRN

## 2013-04-25 NOTE — Telephone Encounter (Signed)
rx ready for pickup 

## 2013-04-26 NOTE — Telephone Encounter (Signed)
done

## 2013-04-26 NOTE — Telephone Encounter (Signed)
Per kay rx up front and pt aware

## 2013-05-13 ENCOUNTER — Other Ambulatory Visit: Payer: Self-pay | Admitting: Nurse Practitioner

## 2013-05-15 ENCOUNTER — Encounter: Payer: Medicare Other | Admitting: Gynecology

## 2013-05-22 ENCOUNTER — Other Ambulatory Visit: Payer: Self-pay | Admitting: Nurse Practitioner

## 2013-05-22 MED ORDER — HYDROCODONE-ACETAMINOPHEN 10-325 MG PO TABS: 1.0000 | ORAL_TABLET | Freq: Three times a day (TID) | ORAL | Status: DC | PRN

## 2013-06-06 ENCOUNTER — Other Ambulatory Visit: Payer: Self-pay | Admitting: Nurse Practitioner

## 2013-06-08 ENCOUNTER — Other Ambulatory Visit: Payer: Self-pay | Admitting: *Deleted

## 2013-06-08 MED ORDER — PAROXETINE HCL 20 MG PO TABS: 20.0000 mg | ORAL_TABLET | Freq: Every day | ORAL | Status: DC

## 2013-06-12 ENCOUNTER — Other Ambulatory Visit: Payer: Self-pay | Admitting: Nurse Practitioner

## 2013-06-18 ENCOUNTER — Other Ambulatory Visit: Payer: Self-pay

## 2013-06-18 MED ORDER — ALPRAZOLAM 0.5 MG PO TABS: 0.5000 mg | ORAL_TABLET | Freq: Three times a day (TID) | ORAL | Status: DC | PRN

## 2013-06-18 NOTE — Telephone Encounter (Signed)
Last seen 3/14  MMM  If approved route to nurse to phone into [facility]

## 2013-06-18 NOTE — Telephone Encounter (Signed)
Please call in xanax rx with 1 refill 

## 2013-06-19 NOTE — Telephone Encounter (Signed)
Called in.

## 2013-06-24 ENCOUNTER — Ambulatory Visit (INDEPENDENT_AMBULATORY_CARE_PROVIDER_SITE_OTHER): Payer: Medicare Other

## 2013-06-24 ENCOUNTER — Other Ambulatory Visit: Payer: Self-pay | Admitting: Nurse Practitioner

## 2013-06-24 DIAGNOSIS — Z23 Encounter for immunization: Secondary | ICD-10-CM

## 2013-06-24 MED ORDER — HYDROCODONE-ACETAMINOPHEN 10-325 MG PO TABS
1.0000 | ORAL_TABLET | Freq: Three times a day (TID) | ORAL | Status: DC | PRN
Start: 2013-06-24 — End: 2013-07-17

## 2013-06-25 ENCOUNTER — Encounter: Payer: Self-pay | Admitting: Gynecology

## 2013-07-01 ENCOUNTER — Encounter: Payer: Self-pay | Admitting: Nurse Practitioner

## 2013-07-01 ENCOUNTER — Ambulatory Visit (INDEPENDENT_AMBULATORY_CARE_PROVIDER_SITE_OTHER): Payer: Medicare Other | Admitting: Nurse Practitioner

## 2013-07-01 VITALS — BP 150/86 | HR 75 | Temp 97.9°F | Ht 65.0 in | Wt 190.0 lb

## 2013-07-01 DIAGNOSIS — F419 Anxiety disorder, unspecified: Secondary | ICD-10-CM

## 2013-07-01 DIAGNOSIS — K279 Peptic ulcer, site unspecified, unspecified as acute or chronic, without hemorrhage or perforation: Secondary | ICD-10-CM

## 2013-07-01 DIAGNOSIS — R300 Dysuria: Secondary | ICD-10-CM

## 2013-07-01 DIAGNOSIS — J441 Chronic obstructive pulmonary disease with (acute) exacerbation: Secondary | ICD-10-CM

## 2013-07-01 DIAGNOSIS — N390 Urinary tract infection, site not specified: Secondary | ICD-10-CM

## 2013-07-01 DIAGNOSIS — F411 Generalized anxiety disorder: Secondary | ICD-10-CM

## 2013-07-01 DIAGNOSIS — K219 Gastro-esophageal reflux disease without esophagitis: Secondary | ICD-10-CM

## 2013-07-01 DIAGNOSIS — E876 Hypokalemia: Secondary | ICD-10-CM

## 2013-07-01 DIAGNOSIS — I10 Essential (primary) hypertension: Secondary | ICD-10-CM

## 2013-07-01 LAB — POCT UA - MICROSCOPIC ONLY
Bacteria, U Microscopic: NEGATIVE
Casts, Ur, LPF, POC: NEGATIVE
Mucus, UA: NEGATIVE

## 2013-07-01 MED ORDER — PANTOPRAZOLE SODIUM 40 MG PO TBEC: 40.0000 mg | DELAYED_RELEASE_TABLET | Freq: Every day | ORAL | Status: DC

## 2013-07-01 MED ORDER — ALPRAZOLAM 0.5 MG PO TABS: 0.5000 mg | ORAL_TABLET | Freq: Three times a day (TID) | ORAL | Status: DC | PRN

## 2013-07-01 MED ORDER — LISINOPRIL-HYDROCHLOROTHIAZIDE 10-12.5 MG PO TABS: 1.0000 | ORAL_TABLET | Freq: Every day | ORAL | Status: DC

## 2013-07-01 MED ORDER — DICYCLOMINE HCL 10 MG PO CAPS: 10.0000 mg | ORAL_CAPSULE | Freq: Four times a day (QID) | ORAL | Status: DC | PRN

## 2013-07-01 MED ORDER — CIPROFLOXACIN HCL 500 MG PO TABS: 500.0000 mg | ORAL_TABLET | Freq: Two times a day (BID) | ORAL | Status: DC

## 2013-07-01 MED ORDER — SIMVASTATIN 40 MG PO TABS: 40.0000 mg | ORAL_TABLET | Freq: Every day | ORAL | Status: DC

## 2013-07-01 MED ORDER — PAROXETINE HCL 20 MG PO TABS: 20.0000 mg | ORAL_TABLET | Freq: Every day | ORAL | Status: DC

## 2013-07-01 NOTE — Progress Notes (Signed)
Subjective:    Patient ID: [name], female    DOB: 03-12-1946, 67 y.o.   MRN: [id number]  Hypertension This is a chronic problem. The current episode started more than 1 year ago. The problem has been waxing and waning since onset. The problem is uncontrolled. Associated symptoms include anxiety, palpitations (rarely) and shortness of breath (COPD). Pertinent negatives include no blurred vision, chest pain, headaches or peripheral edema. Risk factors for coronary artery disease include dyslipidemia, post-menopausal state, smoking/tobacco exposure, sedentary lifestyle and family history. Past treatments include diuretics. The current treatment provides mild improvement. Compliance problems include exercise.  There is no history of a thyroid problem.  Anxiety Presents for follow-up visit. Onset was more than 5 years ago. The problem has been waxing and waning. Symptoms include depressed mood, excessive worry, insomnia, nervous/anxious behavior, palpitations (rarely) and shortness of breath (COPD). Patient reports no chest pain or nausea. Symptoms occur occasionally. The severity of symptoms is mild. The symptoms are aggravated by social activities. The quality of sleep is good. Nighttime awakenings: occasional.   Her past medical history is significant for anxiety/panic attacks and depression. Past treatments include benzodiazephines and SSRIs. The treatment provided moderate relief. Compliance with prior treatments has been good.  Gastrophageal Reflux She reports no chest pain, no coughing, no heartburn or no nausea. This is a chronic problem. The current episode started more than 1 year ago. The problem occurs rarely. The problem has been resolved. The symptoms are aggravated by certain foods. Associated symptoms include fatigue. Risk factors include smoking/tobacco exposure. She has tried a PPI for the symptoms. The treatment provided significant relief.  Hyperlipidemia This is a chronic  problem. The current episode started more than 1 year ago. The problem is controlled. Recent lipid tests were reviewed and are normal. She has no history of diabetes. Factors aggravating her hyperlipidemia include fatty foods and smoking. Associated symptoms include shortness of breath (COPD). Pertinent negatives include no chest pain. Current antihyperlipidemic treatment includes statins. The current treatment provides significant improvement of lipids. Compliance problems include adherence to diet and adherence to exercise.  Risk factors for coronary artery disease include post-menopausal, a sedentary lifestyle, stress, hypertension, dyslipidemia and family history.  Dysuria  This is a new problem. The current episode started today. The problem occurs every urination. The problem has been gradually worsening. The quality of the pain is described as burning. The pain is at a severity of 6/10. The patient is experiencing no pain. There has been no fever. The fever has been present for less than 1 day. Associated symptoms include frequency and urgency. Pertinent negatives include no nausea. She has tried nothing for the symptoms. The treatment provided no relief.   Arthritis  Pt has arthritis lower back, arm, shoulders, and neck-Aching throbbing pain. Pt takes Norco which helps alot  Depression Pt takes Paxil-Pt states she has days-Husband undergoing cancer treatments COPD Pt doesn't take any medications-Feels like her breathing is controlled and does not need anything at this time. Had a RX for an inhaler-but never got RX filled IBS Pt states she takes Bentyl about three times a week- Pt only having diarrhea about three times a day-Depends on what/where she eats    Review of Systems  Unable to perform ROS Constitutional: Positive for fatigue.  Eyes: Negative for blurred vision.  Respiratory: Positive for shortness of breath (COPD). Negative for cough.   Cardiovascular: Positive for palpitations  (rarely). Negative for chest pain.  Gastrointestinal: Negative for heartburn and nausea.  Genitourinary: Positive for dysuria, urgency and frequency.  Neurological: Negative for headaches.  Psychiatric/Behavioral: The patient is nervous/anxious and has insomnia.   All other systems reviewed and are negative.       Objective:   Physical Exam  Nursing note and vitals reviewed. Constitutional: She is oriented to person, place, and time. She appears well-developed and well-nourished.  HENT:  Head: Normocephalic and atraumatic.  Right Ear: External ear normal.  Left Ear: External ear normal.  Nose: Nose normal.  Mouth/Throat: Oropharynx is clear and moist.  Eyes: EOM are normal. Pupils are equal, round, and reactive to light.  Neck: Normal range of motion. Neck supple. No thyromegaly present.  Cardiovascular: Normal rate, regular rhythm, normal heart sounds and intact distal pulses.   Pulmonary/Chest: Effort normal and breath sounds normal.  Abdominal: Soft. Bowel sounds are normal. She exhibits no distension.  Musculoskeletal: Normal range of motion. She exhibits no edema and no tenderness.  Neurological: She is alert and oriented to person, place, and time.  Skin: Skin is warm and dry.  Psychiatric: She has a normal mood and affect. Her behavior is normal. Judgment and thought content normal.     BP 150/86  Pulse 75  Temp(Src) 97.9 F (36.6 C) (Oral)  Ht 5\' 5"  (1.651 m)  Wt 190 lb (86.183 kg)  BMI 31.62 kg/m2       Assessment & Plan:   1. Dysuria   2. Hypokalemia   3. GERD (gastroesophageal reflux disease)   4. COPD exacerbation   5. Benign hypertension   6. Anxiety   7. PUD (peptic ulcer disease)   8. UTI  Orders Placed This Encounter  Procedures  . CMP14+EGFR  . NMR, lipoprofile  . POCT UA - Microscopic Only  . POCT urinalysis dipstick   Meds ordered this encounter  Medications  . simvastatin (ZOCOR) 40 MG tablet    Sig: Take 1 tablet (40 mg total) by  mouth at bedtime.    Dispense:  30 tablet    Refill:  0    Order Specific Question:  Supervising Provider    Answer:  Ernestina Penna [[id number]]  . pantoprazole (PROTONIX) 40 MG tablet    Sig: Take 1 tablet (40 mg total) by mouth daily.    Dispense:  30 tablet    Refill:  5    Order Specific Question:  Supervising Provider    Answer:  Ernestina Penna [[id number]]  . PARoxetine (PAXIL) 20 MG tablet    Sig: Take 1 tablet (20 mg total) by mouth daily.    Dispense:  30 tablet    Refill:  5    Order Specific Question:  Supervising Provider    Answer:  Ernestina Penna [[id number]]  . dicyclomine (BENTYL) 10 MG capsule    Sig: Take 1 capsule (10 mg total) by mouth 4 (four) times daily as needed (Irritable bowl syndrome).    Dispense:  120 capsule    Refill:  5    Order Specific Question:  Supervising Provider    Answer:  Ernestina Penna [[id number]]  . lisinopril-hydrochlorothiazide (PRINZIDE,ZESTORETIC) 10-12.5 MG per tablet    Sig: Take 1 tablet by mouth daily.    Dispense:  90 tablet    Refill:  1    Order Specific Question:  Supervising Provider    Answer:  Ernestina Penna [[id number]]  . ALPRAZolam (XANAX) 0.5 MG tablet    Sig: Take 1 tablet (0.5 mg total) by mouth 3 (three) times daily as  needed. Anxiety    Dispense:  90 tablet    Refill:  1    Do not fill till 07/18/13    Order Specific Question:  Supervising Provider    Answer:  Ernestina Penna [[id number]]  -Cipro 500mg  1 po BID   Continue all meds Labs pending Diet and exercise encouraged Health maintenance reviewed Follow up in 3 months  Mary-Margaret Daphine Deutscher, FNP

## 2013-07-01 NOTE — Patient Instructions (Addendum)
Irritable Bowel Syndrome °Irritable Bowel Syndrome (IBS) is caused by a disturbance of normal bowel function. Other terms used are spastic colon, mucous colitis, and irritable colon. It does not require surgery, nor does it lead to cancer. There is no cure for IBS. But with proper diet, stress reduction, and medication, you will find that your problems (symptoms) will gradually disappear or improve. IBS is a common digestive disorder. It usually appears in late adolescence or early adulthood. Women develop it twice as often as men. °CAUSES  °After food has been digested and absorbed in the small intestine, waste material is moved into the colon (large intestine). In the colon, water and salts are absorbed from the undigested products coming from the small intestine. The remaining residue, or fecal material, is held for elimination. Under normal circumstances, gentle, rhythmic contractions on the bowel walls push the fecal material along the colon towards the rectum. In IBS, however, these contractions are irregular and poorly coordinated. The fecal material is either retained too long, resulting in constipation, or expelled too soon, producing diarrhea. °SYMPTOMS  °The most common symptom of IBS is pain. It is typically in the lower left side of the belly (abdomen). But it may occur anywhere in the abdomen. It can be felt as heartburn, backache, or even as a dull pain in the arms or shoulders. The pain comes from excessive bowel-muscle spasms and from the buildup of gas and fecal material in the colon. This pain: °· Can range from sharp belly (abdominal) cramps to a dull, continuous ache. °· Usually worsens soon after eating. °· Is typically relieved by having a bowel movement or passing gas. °Abdominal pain is usually accompanied by constipation. But it may also produce diarrhea. The diarrhea typically occurs right after a meal or upon arising in the morning. The stools are typically soft and watery. They are often  flecked with secretions (mucus). °Other symptoms of IBS include: °· Bloating. °· Loss of appetite. °· Heartburn. °· Feeling sick to your stomach (nausea). °· Belching °· Vomiting °· Gas. °IBS may also cause a number of symptoms that are unrelated to the digestive system: °· Fatigue. °· Headaches. °· Anxiety °· Shortness of breath °· Difficulty in concentrating. °· Dizziness. °These symptoms tend to come and go. °DIAGNOSIS  °The symptoms of IBS closely mimic the symptoms of other, more serious digestive disorders. So your caregiver may wish to perform a variety of additional tests to exclude these disorders. He/she wants to be certain of learning what is wrong (diagnosis). The nature and purpose of each test will be explained to you. °TREATMENT °A number of medications are available to help correct bowel function and/or relieve bowel spasms and abdominal pain. Among the drugs available are: °· Mild, non-irritating laxatives for severe constipation and to help restore normal bowel habits. °· Specific anti-diarrheal medications to treat severe or prolonged diarrhea. °· Anti-spasmodic agents to relieve intestinal cramps. °· Your caregiver may also decide to treat you with a mild tranquilizer or sedative during unusually stressful periods in your life. °The important thing to remember is that if any drug is prescribed for you, make sure that you take it exactly as directed. Make sure that your caregiver knows how well it worked for you. °HOME CARE INSTRUCTIONS  °· Avoid foods that are high in fat or oils. Some examples are:heavy cream, butter, frankfurters, sausage, and other fatty meats. °· Avoid foods that have a laxative effect, such as fruit, fruit juice, and dairy products. °· Cut out   carbonated drinks, chewing gum, and "gassy" foods, such as beans and cabbage. This may help relieve bloating and belching. °· Bran taken with plenty of liquids may help relieve constipation. °· Keep track of what foods seem to trigger  your symptoms. °· Avoid emotionally charged situations or circumstances that produce anxiety. °· Start or continue exercising. °· Get plenty of rest and sleep. °MAKE SURE YOU:  °· Understand these instructions. °· Will watch your condition. °· Will get help right away if you are not doing well or get worse. °Document Released: 09/19/2005 Document Revised: 12/12/2011 Document Reviewed: 05/09/2008 °ExitCare® Patient Information ©2014 [employer]. ° °

## 2013-07-17 ENCOUNTER — Ambulatory Visit (INDEPENDENT_AMBULATORY_CARE_PROVIDER_SITE_OTHER): Payer: Medicare Other | Admitting: Nurse Practitioner

## 2013-07-17 ENCOUNTER — Encounter: Payer: Self-pay | Admitting: Nurse Practitioner

## 2013-07-17 VITALS — BP 134/71 | HR 65 | Temp 97.0°F | Ht 66.0 in | Wt 190.0 lb

## 2013-07-17 DIAGNOSIS — J069 Acute upper respiratory infection, unspecified: Secondary | ICD-10-CM

## 2013-07-17 MED ORDER — HYDROCODONE-ACETAMINOPHEN 10-325 MG PO TABS: 1.0000 | ORAL_TABLET | Freq: Three times a day (TID) | ORAL | Status: DC | PRN

## 2013-07-17 MED ORDER — BENZONATATE 100 MG PO CAPS: 100.0000 mg | ORAL_CAPSULE | Freq: Two times a day (BID) | ORAL | Status: DC | PRN

## 2013-07-17 MED ORDER — AZITHROMYCIN 250 MG PO TABS: ORAL_TABLET | ORAL | Status: DC

## 2013-07-17 NOTE — Patient Instructions (Signed)

## 2013-07-17 NOTE — Progress Notes (Signed)
  Subjective:    Patient ID: [name], female    DOB: 09/30/1946, 67 y.o.   MRN: [id number]  HPI Patient i c/o cough an dcongestion- started about 3 days ok- Mucinex OTC helping some.    Review of Systems  Constitutional: Negative for fever and chills.  HENT: Positive for postnasal drip, rhinorrhea and sinus pressure. Negative for ear pain, sore throat and trouble swallowing.   Respiratory: Positive for cough (productive).   Cardiovascular: Negative.        Objective:   Physical Exam  Constitutional: She appears well-developed and well-nourished.  HENT:  Right Ear: External ear normal.  Left Ear: External ear normal.  Nose: Nose normal.  Mouth/Throat: Oropharynx is clear and moist.  Eyes: EOM are normal. Pupils are equal, round, and reactive to light.  Neck: Normal range of motion.  Cardiovascular: Normal rate, regular rhythm and normal heart sounds.   Pulmonary/Chest: Effort normal and breath sounds normal. No respiratory distress. She has no wheezes. She has no rales. She exhibits no tenderness.  Deep wet cough   Lymphadenopathy:    She has no cervical adenopathy.    BP 134/71  Pulse 65  Temp(Src) 97 F (36.1 C) (Oral)  Ht 5\' 6"  (1.676 m)  Wt 190 lb (86.183 kg)  BMI 30.68 kg/m2       Assessment & Plan:  1. Upper respiratory infection with cough and congestion 1. Take meds as prescribed 2. Use a cool mist humidifier especially during the winter months and when heat has  been humid. 3. Use saline nose sprays frequently 4. Saline irrigations of the nose can be very helpful if done frequently.  * 4X daily for 1 week*  * Use of a nettie pot can be helpful with this. Follow directions with this* 5. Drink plenty of fluids 6. Keep thermostat turn down low 7.For any cough or congestion  Use plain Mucinex- regular strength or max strength is fine   * Children- consult with Pharmacist for dosing 8. For fever or aces or pains- take tylenol or ibuprofen  appropriate for age and weight.  * for fevers greater than 101 orally you may alternate ibuprofen and tylenol every  3 hours.   Meds ordered this encounter  Medications  . azithromycin (ZITHROMAX) 250 MG tablet    Sig: As directed    Dispense:  6 each    Refill:  0    Order Specific Question:  Supervising Provider    Answer:  Ernestina Penna [[id number]]  . benzonatate (TESSALON) 100 MG capsule    Sig: Take 1 capsule (100 mg total) by mouth 2 (two) times daily as needed for cough.    Dispense:  20 capsule    Refill:  0    Order Specific Question:  Supervising Provider    Answer:  Ernestina Penna [[id number]]   Mary-Margaret Daphine Deutscher, FNP

## 2013-07-17 NOTE — Addendum Note (Signed)
Addended by: Bennie Pierini on: 07/17/2013 03:03 PM   Modules accepted: Orders

## 2013-07-22 ENCOUNTER — Ambulatory Visit: Payer: Self-pay

## 2013-08-07 ENCOUNTER — Other Ambulatory Visit: Payer: Self-pay | Admitting: Nurse Practitioner

## 2013-08-09 LAB — POCT URINALYSIS DIPSTICK
Bilirubin, UA: NEGATIVE
Glucose, UA: NEGATIVE
Nitrite, UA: NEGATIVE
Protein, UA: NEGATIVE
Spec Grav, UA: 1.01

## 2013-08-09 NOTE — Progress Notes (Signed)
Patient came in left urine never came back to get blood drawn. I have tried to contact patient to come back for lab work neither phone number listed is working? When Patient comes back in for appointment she will need CMP,NMR drawn.

## 2013-08-19 ENCOUNTER — Other Ambulatory Visit: Payer: Self-pay | Admitting: Nurse Practitioner

## 2013-08-19 MED ORDER — HYDROCODONE-ACETAMINOPHEN 10-325 MG PO TABS: 1.0000 | ORAL_TABLET | Freq: Three times a day (TID) | ORAL | Status: DC | PRN

## 2013-08-19 NOTE — Progress Notes (Signed)
Patient aware rx ready to be picked up 

## 2013-08-22 ENCOUNTER — Other Ambulatory Visit: Payer: Self-pay | Admitting: Nurse Practitioner

## 2013-08-23 NOTE — Telephone Encounter (Signed)
Do not see this med on current med list. Please advise 

## 2013-09-16 ENCOUNTER — Other Ambulatory Visit: Payer: Self-pay | Admitting: Nurse Practitioner

## 2013-09-16 MED ORDER — HYDROCODONE-ACETAMINOPHEN 10-325 MG PO TABS: 1.0000 | ORAL_TABLET | Freq: Three times a day (TID) | ORAL | Status: DC | PRN

## 2013-09-17 ENCOUNTER — Other Ambulatory Visit: Payer: Self-pay | Admitting: Nurse Practitioner

## 2013-10-15 ENCOUNTER — Other Ambulatory Visit: Payer: Self-pay | Admitting: Nurse Practitioner

## 2013-10-15 MED ORDER — HYDROCODONE-ACETAMINOPHEN 10-325 MG PO TABS: 1.0000 | ORAL_TABLET | Freq: Three times a day (TID) | ORAL | Status: DC | PRN

## 2013-10-17 ENCOUNTER — Other Ambulatory Visit: Payer: Self-pay | Admitting: *Deleted

## 2013-10-17 MED ORDER — ALPRAZOLAM 0.5 MG PO TABS: 0.5000 mg | ORAL_TABLET | Freq: Three times a day (TID) | ORAL | Status: DC | PRN

## 2013-10-17 NOTE — Telephone Encounter (Signed)
Please call in xanax rx 

## 2013-10-17 NOTE — Telephone Encounter (Signed)
Called in.

## 2013-10-17 NOTE — Telephone Encounter (Signed)
Last seen 07/17/13, last filled 09/16/13. Call into [facility]

## 2013-11-11 ENCOUNTER — Other Ambulatory Visit: Payer: Self-pay | Admitting: Nurse Practitioner

## 2013-11-11 MED ORDER — ALPRAZOLAM 0.5 MG PO TABS: 0.5000 mg | ORAL_TABLET | Freq: Three times a day (TID) | ORAL | Status: DC | PRN

## 2013-11-11 MED ORDER — HYDROCODONE-ACETAMINOPHEN 10-325 MG PO TABS: 1.0000 | ORAL_TABLET | Freq: Three times a day (TID) | ORAL | Status: DC | PRN

## 2013-12-11 ENCOUNTER — Other Ambulatory Visit: Payer: Self-pay | Admitting: Nurse Practitioner

## 2013-12-11 MED ORDER — HYDROCODONE-ACETAMINOPHEN 10-325 MG PO TABS: 1.0000 | ORAL_TABLET | Freq: Three times a day (TID) | ORAL | Status: DC | PRN

## 2013-12-11 MED ORDER — ALPRAZOLAM 0.5 MG PO TABS: 0.5000 mg | ORAL_TABLET | Freq: Three times a day (TID) | ORAL | Status: DC | PRN

## 2013-12-12 ENCOUNTER — Telehealth: Payer: Self-pay | Admitting: *Deleted

## 2013-12-12 NOTE — Telephone Encounter (Signed)
Xanax and hydrocodone scripts ready, patient aware.

## 2014-01-01 ENCOUNTER — Other Ambulatory Visit: Payer: Self-pay | Admitting: Nurse Practitioner

## 2014-01-14 ENCOUNTER — Telehealth: Payer: Self-pay | Admitting: Nurse Practitioner

## 2014-01-14 MED ORDER — HYDROCODONE-ACETAMINOPHEN 10-325 MG PO TABS: 1.0000 | ORAL_TABLET | Freq: Three times a day (TID) | ORAL | Status: DC | PRN

## 2014-01-14 MED ORDER — ALPRAZOLAM 0.5 MG PO TABS: 0.5000 mg | ORAL_TABLET | Freq: Three times a day (TID) | ORAL | Status: DC | PRN

## 2014-01-14 NOTE — Telephone Encounter (Signed)
rx ready for pickup 

## 2014-01-15 ENCOUNTER — Telehealth: Payer: Self-pay | Admitting: Nurse Practitioner

## 2014-01-15 NOTE — Telephone Encounter (Signed)
Patient aware to pick up 

## 2014-01-31 ENCOUNTER — Other Ambulatory Visit: Payer: Self-pay | Admitting: Nurse Practitioner

## 2014-02-03 ENCOUNTER — Other Ambulatory Visit: Payer: Self-pay | Admitting: Nurse Practitioner

## 2014-02-03 NOTE — Telephone Encounter (Signed)
Patient NTBS for follow up and lab work  

## 2014-02-03 NOTE — Telephone Encounter (Signed)
Last seen 06/2013 

## 2014-02-04 NOTE — Telephone Encounter (Signed)
Last OV 07-17-13. Please advise on refill

## 2014-02-10 ENCOUNTER — Other Ambulatory Visit: Payer: Self-pay | Admitting: Nurse Practitioner

## 2014-02-10 MED ORDER — ALPRAZOLAM 0.5 MG PO TABS: 0.5000 mg | ORAL_TABLET | Freq: Three times a day (TID) | ORAL | Status: DC | PRN

## 2014-02-10 MED ORDER — HYDROCODONE-ACETAMINOPHEN 10-325 MG PO TABS: 1.0000 | ORAL_TABLET | Freq: Three times a day (TID) | ORAL | Status: DC | PRN

## 2014-03-04 ENCOUNTER — Other Ambulatory Visit: Payer: Self-pay | Admitting: Nurse Practitioner

## 2014-03-05 NOTE — Telephone Encounter (Signed)
Last seen 07/17/13 MMM  Last lipid 07/01/13 

## 2014-03-05 NOTE — Telephone Encounter (Signed)
Patient NTBS for follow up and lab work  

## 2014-03-07 ENCOUNTER — Telehealth: Payer: Self-pay | Admitting: Nurse Practitioner

## 2014-03-07 NOTE — Telephone Encounter (Signed)
Will eithe rhave to let someone else do it or will have to wait till i get back.Let paient know

## 2014-03-13 ENCOUNTER — Telehealth: Payer: Self-pay | Admitting: Nurse Practitioner

## 2014-03-13 MED ORDER — ALPRAZOLAM 0.5 MG PO TABS: 0.5000 mg | ORAL_TABLET | Freq: Three times a day (TID) | ORAL | Status: DC | PRN

## 2014-03-13 MED ORDER — HYDROCODONE-ACETAMINOPHEN 10-325 MG PO TABS: 1.0000 | ORAL_TABLET | Freq: Three times a day (TID) | ORAL | Status: DC | PRN

## 2014-03-13 NOTE — Telephone Encounter (Signed)
Will you do for [name]?

## 2014-03-13 NOTE — Telephone Encounter (Signed)
Patient aware.

## 2014-03-13 NOTE — Telephone Encounter (Signed)
Please okay these prescriptions so they can be signed and put up front

## 2014-04-02 ENCOUNTER — Other Ambulatory Visit: Payer: Self-pay | Admitting: Nurse Practitioner

## 2014-04-03 NOTE — Telephone Encounter (Signed)
Patient NTBS for follow up and lab work  

## 2014-04-03 NOTE — Telephone Encounter (Signed)
Last seen 07/17/13 MMM  Last lipid 07/01/13

## 2014-04-07 ENCOUNTER — Telehealth: Payer: Self-pay | Admitting: Nurse Practitioner

## 2014-04-07 MED ORDER — HYDROCODONE-ACETAMINOPHEN 10-325 MG PO TABS: 1.0000 | ORAL_TABLET | Freq: Three times a day (TID) | ORAL | Status: DC | PRN

## 2014-04-07 MED ORDER — ALPRAZOLAM 0.5 MG PO TABS: 0.5000 mg | ORAL_TABLET | Freq: Three times a day (TID) | ORAL | Status: DC | PRN

## 2014-04-07 NOTE — Telephone Encounter (Signed)
rx ready for pickup 

## 2014-04-08 NOTE — Telephone Encounter (Signed)
Called in.

## 2014-04-30 ENCOUNTER — Other Ambulatory Visit: Payer: Self-pay | Admitting: Nurse Practitioner

## 2014-05-01 ENCOUNTER — Other Ambulatory Visit: Payer: Self-pay | Admitting: Nurse Practitioner

## 2014-05-01 NOTE — Telephone Encounter (Signed)
Patient last seen in office on 07-17-13. Please advise on refills

## 2014-05-02 NOTE — Telephone Encounter (Signed)
No refill on cholesterol med One refill on paxil no more refills without being seen

## 2014-05-02 NOTE — Telephone Encounter (Signed)
no more refills without being seen  

## 2014-05-02 NOTE — Telephone Encounter (Signed)
Last check-up was 06/2013

## 2014-05-04 ENCOUNTER — Other Ambulatory Visit: Payer: Self-pay | Admitting: Nurse Practitioner

## 2014-05-05 ENCOUNTER — Telehealth: Payer: Self-pay | Admitting: Nurse Practitioner

## 2014-05-05 MED ORDER — ALPRAZOLAM 0.5 MG PO TABS: 0.5000 mg | ORAL_TABLET | Freq: Three times a day (TID) | ORAL | Status: DC | PRN

## 2014-05-05 MED ORDER — HYDROCODONE-ACETAMINOPHEN 10-325 MG PO TABS: 1.0000 | ORAL_TABLET | Freq: Three times a day (TID) | ORAL | Status: DC | PRN

## 2014-05-05 NOTE — Telephone Encounter (Signed)
no more refills without being seen  

## 2014-05-06 NOTE — Telephone Encounter (Signed)
No refill until seen for labs

## 2014-05-06 NOTE — Telephone Encounter (Signed)
No lipids in Epic.

## 2014-05-07 ENCOUNTER — Other Ambulatory Visit: Payer: Self-pay | Admitting: Nurse Practitioner

## 2014-05-09 ENCOUNTER — Encounter: Payer: Self-pay | Admitting: Gastroenterology

## 2014-06-02 ENCOUNTER — Other Ambulatory Visit: Payer: Self-pay | Admitting: *Deleted

## 2014-06-02 MED ORDER — PAROXETINE HCL 20 MG PO TABS: ORAL_TABLET | ORAL | Status: DC

## 2014-06-02 NOTE — Telephone Encounter (Signed)
no more refills without being seen  

## 2014-06-02 NOTE — Telephone Encounter (Signed)
Last ov 10/14. ntbs

## 2014-06-02 NOTE — Telephone Encounter (Signed)
Last ov 10/14.

## 2014-06-03 MED ORDER — PANTOPRAZOLE SODIUM 40 MG PO TBEC: 40.0000 mg | DELAYED_RELEASE_TABLET | Freq: Every day | ORAL | Status: DC

## 2014-06-03 NOTE — Telephone Encounter (Signed)
no more refills without being seen  

## 2014-06-05 ENCOUNTER — Other Ambulatory Visit: Payer: Self-pay

## 2014-06-11 ENCOUNTER — Ambulatory Visit (INDEPENDENT_AMBULATORY_CARE_PROVIDER_SITE_OTHER): Payer: Medicare Other | Admitting: Nurse Practitioner

## 2014-06-11 ENCOUNTER — Encounter: Payer: Self-pay | Admitting: Nurse Practitioner

## 2014-06-11 VITALS — BP 117/76 | HR 85 | Temp 98.3°F | Ht 66.0 in | Wt 191.0 lb

## 2014-06-11 DIAGNOSIS — M545 Low back pain, unspecified: Secondary | ICD-10-CM

## 2014-06-11 DIAGNOSIS — F411 Generalized anxiety disorder: Secondary | ICD-10-CM

## 2014-06-11 DIAGNOSIS — F419 Anxiety disorder, unspecified: Secondary | ICD-10-CM

## 2014-06-11 DIAGNOSIS — K219 Gastro-esophageal reflux disease without esophagitis: Secondary | ICD-10-CM

## 2014-06-11 DIAGNOSIS — I10 Essential (primary) hypertension: Secondary | ICD-10-CM

## 2014-06-11 DIAGNOSIS — Z9289 Personal history of other medical treatment: Secondary | ICD-10-CM

## 2014-06-11 DIAGNOSIS — E876 Hypokalemia: Secondary | ICD-10-CM

## 2014-06-11 DIAGNOSIS — Z9189 Other specified personal risk factors, not elsewhere classified: Secondary | ICD-10-CM

## 2014-06-11 DIAGNOSIS — E785 Hyperlipidemia, unspecified: Secondary | ICD-10-CM

## 2014-06-11 DIAGNOSIS — Z713 Dietary counseling and surveillance: Secondary | ICD-10-CM

## 2014-06-11 DIAGNOSIS — K589 Irritable bowel syndrome without diarrhea: Secondary | ICD-10-CM

## 2014-06-11 DIAGNOSIS — G8929 Other chronic pain: Secondary | ICD-10-CM

## 2014-06-11 DIAGNOSIS — Z6833 Body mass index (BMI) 33.0-33.9, adult: Secondary | ICD-10-CM

## 2014-06-11 DIAGNOSIS — F17200 Nicotine dependence, unspecified, uncomplicated: Secondary | ICD-10-CM

## 2014-06-11 DIAGNOSIS — J449 Chronic obstructive pulmonary disease, unspecified: Secondary | ICD-10-CM

## 2014-06-11 MED ORDER — SIMVASTATIN 40 MG PO TABS: ORAL_TABLET | ORAL | Status: DC

## 2014-06-11 MED ORDER — PANTOPRAZOLE SODIUM 40 MG PO TBEC: 40.0000 mg | DELAYED_RELEASE_TABLET | Freq: Every day | ORAL | Status: DC

## 2014-06-11 MED ORDER — LISINOPRIL-HYDROCHLOROTHIAZIDE 10-12.5 MG PO TABS: ORAL_TABLET | ORAL | Status: DC

## 2014-06-11 MED ORDER — HYDROCODONE-ACETAMINOPHEN 10-325 MG PO TABS: 1.0000 | ORAL_TABLET | Freq: Three times a day (TID) | ORAL | Status: DC | PRN

## 2014-06-11 MED ORDER — ALPRAZOLAM 0.5 MG PO TABS: 0.5000 mg | ORAL_TABLET | Freq: Three times a day (TID) | ORAL | Status: DC | PRN

## 2014-06-11 MED ORDER — PAROXETINE HCL 20 MG PO TABS: ORAL_TABLET | ORAL | Status: DC

## 2014-06-11 MED ORDER — DICYCLOMINE HCL 10 MG PO CAPS: 10.0000 mg | ORAL_CAPSULE | Freq: Four times a day (QID) | ORAL | Status: DC | PRN

## 2014-06-11 NOTE — Patient Instructions (Signed)
Smoking Cessation Quitting smoking is important to your health and has many advantages. However, it is not always easy to quit since nicotine is a very addictive drug. Oftentimes, people try 3 times or more before being able to quit. This document explains the best ways for you to prepare to quit smoking. Quitting takes hard work and a lot of effort, but you can do it. ADVANTAGES OF QUITTING SMOKING  You will live longer, feel better, and live better.  Your body will feel the impact of quitting smoking almost immediately.  Within 20 minutes, blood pressure decreases. Your pulse returns to its normal level.  After 8 hours, carbon monoxide levels in the blood return to normal. Your oxygen level increases.  After 24 hours, the chance of having a heart attack starts to decrease. Your breath, hair, and body stop smelling like smoke.  After 48 hours, damaged nerve endings begin to recover. Your sense of taste and smell improve.  After 72 hours, the body is virtually free of nicotine. Your bronchial tubes relax and breathing becomes easier.  After 2 to 12 weeks, lungs can hold more air. Exercise becomes easier and circulation improves.  The risk of having a heart attack, stroke, cancer, or lung disease is greatly reduced.  After 1 year, the risk of coronary heart disease is cut in half.  After 5 years, the risk of stroke falls to the same as a nonsmoker.  After 10 years, the risk of lung cancer is cut in half and the risk of other cancers decreases significantly.  After 15 years, the risk of coronary heart disease drops, usually to the level of a nonsmoker.  If you are pregnant, quitting smoking will improve your chances of having a healthy baby.  The people you live with, especially any children, will be healthier.  You will have extra money to spend on things other than cigarettes. QUESTIONS TO THINK ABOUT BEFORE ATTEMPTING TO QUIT You may want to talk about your answers with your  health care provider.  Why do you want to quit?  If you tried to quit in the past, what helped and what did not?  What will be the most difficult situations for you after you quit? How will you plan to handle them?  Who can help you through the tough times? Your family? Friends? A health care provider?  What pleasures do you get from smoking? What ways can you still get pleasure if you quit? Here are some questions to ask your health care provider:  How can you help me to be successful at quitting?  What medicine do you think would be best for me and how should I take it?  What should I do if I need more help?  What is smoking withdrawal like? How can I get information on withdrawal? GET READY  Set a quit date.  Change your environment by getting rid of all cigarettes, ashtrays, matches, and lighters in your home, car, or work. Do not let people smoke in your home.  Review your past attempts to quit. Think about what worked and what did not. GET SUPPORT AND ENCOURAGEMENT You have a better chance of being successful if you have help. You can get support in many ways.  Tell your family, friends, and coworkers that you are going to quit and need their support. Ask them not to smoke around you.  Get individual, group, or telephone counseling and support. Programs are available at local hospitals and health centers. Call   your local health department for information about programs in your area.  Spiritual beliefs and practices may help some smokers quit.  Download a "quit meter" on your computer to keep track of quit statistics, such as how long you have gone without smoking, cigarettes not smoked, and money saved.  Get a self-help book about quitting smoking and staying off tobacco. LEARN NEW SKILLS AND BEHAVIORS  Distract yourself from urges to smoke. Talk to someone, go for a walk, or occupy your time with a task.  Change your normal routine. Take a different route to work.  Drink tea instead of coffee. Eat breakfast in a different place.  Reduce your stress. Take a hot bath, exercise, or read a book.  Plan something enjoyable to do every day. Reward yourself for not smoking.  Explore interactive web-based programs that specialize in helping you quit. GET MEDICINE AND USE IT CORRECTLY Medicines can help you stop smoking and decrease the urge to smoke. Combining medicine with the above behavioral methods and support can greatly increase your chances of successfully quitting smoking.  Nicotine replacement therapy helps deliver nicotine to your body without the negative effects and risks of smoking. Nicotine replacement therapy includes nicotine gum, lozenges, inhalers, nasal sprays, and skin patches. Some may be available over-the-counter and others require a prescription.  Antidepressant medicine helps people abstain from smoking, but how this works is unknown. This medicine is available by prescription.  Nicotinic receptor partial agonist medicine simulates the effect of nicotine in your brain. This medicine is available by prescription. Ask your health care provider for advice about which medicines to use and how to use them based on your health history. Your health care provider will tell you what side effects to look out for if you choose to be on a medicine or therapy. Carefully read the information on the package. Do not use any other product containing nicotine while using a nicotine replacement product.  RELAPSE OR DIFFICULT SITUATIONS Most relapses occur within the first 3 months after quitting. Do not be discouraged if you start smoking again. Remember, most people try several times before finally quitting. You may have symptoms of withdrawal because your body is used to nicotine. You may crave cigarettes, be irritable, feel very hungry, cough often, get headaches, or have difficulty concentrating. The withdrawal symptoms are only temporary. They are strongest  when you first quit, but they will go away within 10-14 days. To reduce the chances of relapse, try to:  Avoid drinking alcohol. Drinking lowers your chances of successfully quitting.  Reduce the amount of caffeine you consume. Once you quit smoking, the amount of caffeine in your body increases and can give you symptoms, such as a rapid heartbeat, sweating, and anxiety.  Avoid smokers because they can make you want to smoke.  Do not let weight gain distract you. Many smokers will gain weight when they quit, usually less than 10 pounds. Eat a healthy diet and stay active. You can always lose the weight gained after you quit.  Find ways to improve your mood other than smoking. FOR MORE INFORMATION  www.smokefree.gov  Document Released: 09/13/2001 Document Revised: 02/03/2014 Document Reviewed: 12/29/2011 ExitCare Patient Information 2015 [employer]. This information is not intended to replace advice given to you by your health care provider. Make sure you discuss any questions you have with your health care provider.  

## 2014-06-11 NOTE — Progress Notes (Signed)
Subjective:    Patient ID: [name], female    DOB: [birth year], 68 y.o.   MRN: [id number]  Patient here today for follow up of chronic medical problems.  Hypertension This is a chronic problem. The current episode started more than 1 year ago. The problem has been resolved since onset. The problem is controlled. Pertinent negatives include no blurred vision, chest pain, headaches, orthopnea, palpitations, peripheral edema or shortness of breath. Risk factors for coronary artery disease include dyslipidemia, obesity, post-menopausal state, smoking/tobacco exposure and stress. Past treatments include ACE inhibitors and diuretics. The current treatment provides significant improvement. There are no compliance problems.  There is no history of angina, kidney disease, CAD/MI, CVA, heart failure, PVD, retinopathy or a thyroid problem. There is no history of chronic renal disease.  Hyperlipidemia This is a chronic problem. The current episode started more than 1 year ago. The problem is controlled. Recent lipid tests were reviewed and are normal. Exacerbating diseases include obesity. She has no history of chronic renal disease. Factors aggravating her hyperlipidemia include smoking. Pertinent negatives include no chest pain or shortness of breath. Current antihyperlipidemic treatment includes statins. The current treatment provides significant improvement of lipids. There are no compliance problems.  Risk factors for coronary artery disease include dyslipidemia, hypertension, obesity, post-menopausal and stress.  GAD Currently takes Paxil 2m qd and xanax .519m1 tid.  Has stress at home taking care of husband who is recovering from lung cancer. COPD Currently smokes 1 pack per day.  Is not on an inhaler due to cost.  GERD Currently takes Protonix q day with good results.    Chronic back pain Currently taking Hydrocodone 10/32536m po tid for relief of lumbar pain and right leg pain.       Review of Systems  Constitutional: Negative.   Eyes: Negative for blurred vision.  Respiratory: Negative.  Negative for shortness of breath.   Cardiovascular: Negative.  Negative for chest pain, palpitations and orthopnea.  Gastrointestinal: Negative.   Musculoskeletal: Positive for back pain.       Right leg pain  Skin: Negative.   Neurological: Negative.  Negative for headaches.  Psychiatric/Behavioral: Negative.        Objective:   Physical Exam  Constitutional: She is oriented to person, place, and time. She appears well-developed and well-nourished.  HENT:  Nose: Nose normal.  Mouth/Throat: Oropharynx is clear and moist.  Eyes: EOM are normal.  Neck: Trachea normal, normal range of motion and full passive range of motion without pain. Neck supple. No JVD present. Carotid bruit is not present. No thyromegaly present.  Cardiovascular: Normal rate, regular rhythm, normal heart sounds and intact distal pulses.  Exam reveals no gallop and no friction rub.   No murmur heard. Pulmonary/Chest: Effort normal and breath sounds normal.  Abdominal: Soft. Bowel sounds are normal. She exhibits no distension and no mass. There is no tenderness.  Musculoskeletal:       Lumbar back: She exhibits decreased range of motion and pain.  Lymphadenopathy:    She has no cervical adenopathy.  Neurological: She is alert and oriented to person, place, and time. She has normal reflexes.  Skin: Skin is warm and dry.  Psychiatric: She has a normal mood and affect. Her behavior is normal. Judgment and thought content normal.   BP 117/76  Pulse 85  Temp(Src) 98.3 F (36.8 C) (Oral)  Ht '5\' 6"'  (1.676 m)  Wt 191 lb (86.637 kg)  BMI 30.84 kg/m2  Assessment & Plan:   1. Smokes tobacco daily   2. Hypokalemia   3. Gastroesophageal reflux disease without esophagitis   4. Chronic lower back pain   5. Benign hypertension   6. Anxiety   7. COPD (chronic obstructive pulmonary disease)  with chronic bronchitis   8. H/O screening mammography   9. Hyperlipidemia with target LDL less than 100   10. IBS (irritable bowel syndrome)   11. BMI 33.0-33.9,adult   12. Weight loss counseling, encounter for     Orders Placed This Encounter  Procedures  . MM Digital Screening    Standing Status: Future     Number of Occurrences:      Standing Expiration Date: 08/12/2015    Order Specific Question:  Reason for Exam (SYMPTOM  OR DIAGNOSIS REQUIRED)    Answer:  screening    Order Specific Question:  Preferred imaging location?    Answer:  External     Comments:  [facility]  . CMP14+EGFR  . NMR, lipoprofile   Meds ordered this encounter  Medications  . simvastatin (ZOCOR) 40 MG tablet    Sig: TAKE ONE TABLET BY MOUTH AT BEDTIME    Dispense:  30 tablet    Refill:  5    Order Specific Question:  Supervising Provider    Answer:  Chipper Herb [[id number]]  . PARoxetine (PAXIL) 20 MG tablet    Sig: TAKE ONE TABLET BY MOUTH ONE TIME DAILY    Dispense:  30 tablet    Refill:  5    Order Specific Question:  Supervising Provider    Answer:  Chipper Herb [[id number]]  . pantoprazole (PROTONIX) 40 MG tablet    Sig: Take 1 tablet (40 mg total) by mouth daily.    Dispense:  30 tablet    Refill:  5    ntbs for further refillls    Order Specific Question:  Supervising Provider    Answer:  Chipper Herb [[id number]]  . lisinopril-hydrochlorothiazide (PRINZIDE,ZESTORETIC) 10-12.5 MG per tablet    Sig: TAKE ONE TABLET BY MOUTH ONE TIME DAILY    Dispense:  90 tablet    Refill:  1    Order Specific Question:  Supervising Provider    Answer:  Chipper Herb [[id number]]  . HYDROcodone-acetaminophen (NORCO) 10-325 MG per tablet    Sig: Take 1 tablet by mouth every 8 (eight) hours as needed. For Pain    Dispense:  90 tablet    Refill:  0    Do not filll until 06/14/14    Order Specific Question:  Supervising Provider    Answer:  Chipper Herb [[id number]]  . dicyclomine (BENTYL) 10 MG capsule    Sig:  Take 1 capsule (10 mg total) by mouth 4 (four) times daily as needed (Irritable bowl syndrome).    Dispense:  120 capsule    Refill:  5    Order Specific Question:  Supervising Provider    Answer:  Chipper Herb [[id number]]  . ALPRAZolam (XANAX) 0.5 MG tablet    Sig: Take 1 tablet (0.5 mg total) by mouth 3 (three) times daily as needed. Anxiety    Dispense:  90 tablet    Refill:  0    Do not fill till 06/14/14    Order Specific Question:  Supervising Provider    Answer:  Chipper Herb [[id number]]  smoking cessation Mammo ordered Refuses immunizations Discussed weight management for patient with BMI> 25 Labs pending Health maintenance reviewed Diet and  exercise encouraged Continue all meds Follow up  In 3 months   [place], FNP

## 2014-06-12 ENCOUNTER — Telehealth: Payer: Self-pay | Admitting: *Deleted

## 2014-06-12 LAB — NMR, LIPOPROFILE
CHOLESTEROL: 176 mg/dL (ref 100–199)
HDL CHOLESTEROL BY NMR: 63 mg/dL (ref >39)
HDL PARTICLE NUMBER: 44.2 umol/L (ref >=30.5)
LDL Particle Number: 754 nmol/L (ref <1000)
LDL Size: 21 nm (ref >20.5)
LDLC SERPL CALC-MCNC: 53 mg/dL (ref 0–99)
LP-IR Score: 45 (ref <=45)
SMALL LDL PARTICLE NUMBER: 251 nmol/L (ref <=527)
Triglycerides by NMR: 299 mg/dL — ABNORMAL HIGH (ref 0–149)

## 2014-06-12 LAB — CMP14+EGFR
A/G RATIO: 1.9 (ref 1.1–2.5)
ALBUMIN: 4 g/dL (ref 3.6–4.8)
ALK PHOS: 70 IU/L (ref 39–117)
ALT: 16 IU/L (ref 0–32)
AST: 21 IU/L (ref 0–40)
BUN / CREAT RATIO: 7 — AB (ref 11–26)
BUN: 4 mg/dL — AB (ref 8–27)
CALCIUM: 9.5 mg/dL (ref 8.7–10.3)
CO2: 26 mmol/L (ref 18–29)
CREATININE: 0.6 mg/dL (ref 0.57–1.00)
Chloride: 97 mmol/L (ref 97–108)
GFR calc Af Amer: 110 mL/min/{1.73_m2} (ref >59)
GFR, EST NON AFRICAN AMERICAN: 95 mL/min/{1.73_m2} (ref >59)
Globulin, Total: 2.1 g/dL (ref 1.5–4.5)
Glucose: 94 mg/dL (ref 65–99)
Potassium: 4.2 mmol/L (ref 3.5–5.2)
Sodium: 139 mmol/L (ref 134–144)
Total Bilirubin: 0.2 mg/dL (ref 0.0–1.2)
Total Protein: 6.1 g/dL (ref 6.0–8.5)

## 2014-06-12 NOTE — Telephone Encounter (Signed)
Message copied by Jamelle Haring on Thu Jun 12, 2014  4:00 PM ------      Message from: Chevis Pretty      Created: Thu Jun 12, 2014  7:55 AM       Kidney and liver function stable      Cholesterol looks great      Continue current meds- low fat diet and exercise and recheck in 3 months       ------

## 2014-06-12 NOTE — Telephone Encounter (Signed)
Pt aware of lab results 

## 2014-07-07 ENCOUNTER — Telehealth: Payer: Self-pay | Admitting: Nurse Practitioner

## 2014-07-07 DIAGNOSIS — F419 Anxiety disorder, unspecified: Secondary | ICD-10-CM

## 2014-07-07 DIAGNOSIS — M545 Low back pain: Principal | ICD-10-CM

## 2014-07-07 DIAGNOSIS — G8929 Other chronic pain: Secondary | ICD-10-CM

## 2014-07-07 MED ORDER — ALPRAZOLAM 0.5 MG PO TABS: 0.5000 mg | ORAL_TABLET | Freq: Three times a day (TID) | ORAL | Status: DC | PRN

## 2014-07-07 MED ORDER — HYDROCODONE-ACETAMINOPHEN 10-325 MG PO TABS: 1.0000 | ORAL_TABLET | Freq: Three times a day (TID) | ORAL | Status: DC | PRN

## 2014-07-07 NOTE — Telephone Encounter (Signed)
Patient aware.

## 2014-07-07 NOTE — Telephone Encounter (Signed)
rx ready for pickup 

## 2014-07-15 ENCOUNTER — Other Ambulatory Visit: Payer: Medicare Other | Admitting: Nurse Practitioner

## 2014-08-04 ENCOUNTER — Other Ambulatory Visit: Payer: Self-pay | Admitting: Nurse Practitioner

## 2014-08-04 ENCOUNTER — Encounter: Payer: Self-pay | Admitting: Nurse Practitioner

## 2014-08-04 DIAGNOSIS — M545 Low back pain, unspecified: Secondary | ICD-10-CM

## 2014-08-04 DIAGNOSIS — G8929 Other chronic pain: Secondary | ICD-10-CM

## 2014-08-04 DIAGNOSIS — F419 Anxiety disorder, unspecified: Secondary | ICD-10-CM

## 2014-08-04 MED ORDER — ALPRAZOLAM 0.5 MG PO TABS: 0.5000 mg | ORAL_TABLET | Freq: Three times a day (TID) | ORAL | Status: DC | PRN

## 2014-08-04 MED ORDER — HYDROCODONE-ACETAMINOPHEN 10-325 MG PO TABS: 1.0000 | ORAL_TABLET | Freq: Three times a day (TID) | ORAL | Status: DC | PRN

## 2014-08-04 NOTE — Telephone Encounter (Signed)
rx ready for pickup 

## 2014-08-04 NOTE — Telephone Encounter (Signed)
Aware. 

## 2014-08-18 ENCOUNTER — Ambulatory Visit (INDEPENDENT_AMBULATORY_CARE_PROVIDER_SITE_OTHER): Payer: Medicare Other | Admitting: Family Medicine

## 2014-08-18 ENCOUNTER — Ambulatory Visit (INDEPENDENT_AMBULATORY_CARE_PROVIDER_SITE_OTHER): Payer: Medicare Other

## 2014-08-18 ENCOUNTER — Encounter: Payer: Self-pay | Admitting: Family Medicine

## 2014-08-18 VITALS — BP 123/75 | HR 75 | Temp 98.2°F | Ht 66.0 in | Wt 188.0 lb

## 2014-08-18 DIAGNOSIS — M25572 Pain in left ankle and joints of left foot: Secondary | ICD-10-CM

## 2014-08-18 DIAGNOSIS — M7532 Calcific tendinitis of left shoulder: Secondary | ICD-10-CM

## 2014-08-18 DIAGNOSIS — M7582 Other shoulder lesions, left shoulder: Secondary | ICD-10-CM

## 2014-08-18 DIAGNOSIS — M7530 Calcific tendinitis of unspecified shoulder: Secondary | ICD-10-CM

## 2014-08-18 DIAGNOSIS — S8262XA Displaced fracture of lateral malleolus of left fibula, initial encounter for closed fracture: Secondary | ICD-10-CM

## 2014-08-18 DIAGNOSIS — M25512 Pain in left shoulder: Secondary | ICD-10-CM

## 2014-08-18 DIAGNOSIS — M778 Other enthesopathies, not elsewhere classified: Secondary | ICD-10-CM

## 2014-08-18 MED ORDER — POST-OP SHOE/SOFT TOP WOMEN MISC: Status: DC

## 2014-08-18 NOTE — Progress Notes (Signed)
Subjective:    Patient ID: [name], female    DOB: 19-Apr-1946, 68 y.o.   MRN: [id number]  HPI Patient here today for on-going left shoulder / arm pain. She also had a fall on Saturday, where she injured her left ankle.           Patient Active Problem List   Diagnosis Date Noted  . Acute bronchitis 11/26/2012  . COPD (chronic obstructive pulmonary disease) with chronic bronchitis 11/26/2012  . Smokes tobacco daily 11/26/2012  . Anxiety 11/26/2012  . Hypokalemia 11/26/2012  . Benign hypertension 11/26/2012  . Chronic lower back pain 11/26/2012  . GERD (gastroesophageal reflux disease) 12/29/2011  . Pyloric stenosis, acquired 12/29/2011  . PUD (peptic ulcer disease) 11/09/2011  . Chronic diarrhea 11/09/2011   Outpatient Encounter Prescriptions as of 08/18/2014  Medication Sig  . ALPRAZolam (XANAX) 0.5 MG tablet Take 1 tablet (0.5 mg total) by mouth 3 (three) times daily as needed. Anxiety  . calcium carbonate (OS-CAL) 600 MG TABS Take 600 mg by mouth daily.   . chlorzoxazone (PARAFON) 500 MG tablet TAKE ONE TABLET BY MOUTH THREE TIMES DAILY AS NEEDED  . Cholecalciferol (VITAMIN D3) 1000 UNITS CAPS Take 3,000 mg by mouth daily.   Marland Kitchen dicyclomine (BENTYL) 10 MG capsule Take 1 capsule (10 mg total) by mouth 4 (four) times daily as needed (Irritable bowl syndrome).  Marland Kitchen diphenhydrAMINE (BENADRYL) 25 MG tablet Take 25 mg by mouth 2 (two) times daily as needed. For Allergies  . HYDROcodone-acetaminophen (NORCO) 10-325 MG per tablet Take 1 tablet by mouth every 8 (eight) hours as needed. For Pain  . lisinopril-hydrochlorothiazide (PRINZIDE,ZESTORETIC) 10-12.5 MG per tablet TAKE ONE TABLET BY MOUTH ONE TIME DAILY  . Multiple Vitamin (MULITIVITAMIN WITH MINERALS) TABS Take 1 tablet by mouth daily.  . pantoprazole (PROTONIX) 40 MG tablet Take 1 tablet (40 mg total) by mouth daily.  Marland Kitchen PARoxetine (PAXIL) 20 MG tablet TAKE ONE TABLET BY MOUTH ONE TIME DAILY  . simvastatin  (ZOCOR) 40 MG tablet TAKE ONE TABLET BY MOUTH AT BEDTIME  . vitamin C (ASCORBIC ACID) 500 MG tablet Take 1,000 mg by mouth daily.     Review of Systems  Constitutional: Negative.   HENT: Negative.   Eyes: Negative.   Respiratory: Negative.   Cardiovascular: Negative.   Gastrointestinal: Negative.   Endocrine: Negative.   Genitourinary: Negative.   Musculoskeletal: Negative.        Left arm pain - on-going Left ankle pain - from fall  Skin: Negative.   Allergic/Immunologic: Negative.   Neurological: Negative.   Hematological: Negative.   Psychiatric/Behavioral: Negative.        Objective:   Physical Exam  Constitutional: She is oriented to person, place, and time. She appears distressed.  The patient appears somewhat stressed and exhausted.  HENT:  Head: Normocephalic and atraumatic.  Eyes: Conjunctivae and EOM are normal. Pupils are equal, round, and reactive to light. Right eye exhibits no discharge. Left eye exhibits no discharge. No scleral icterus.  Neck: Normal range of motion.  Cardiovascular: Normal rate, regular rhythm and normal heart sounds.   At 72/m  Pulmonary/Chest: Effort normal. No respiratory distress. She has wheezes. She has no rales. She exhibits no tenderness.  There are scattered wheezes bilaterally and rhonchi with coughing  Musculoskeletal: She exhibits edema and tenderness.  There is swelling and tenderness of the left lateral ankle with bruising. There is also tenderness in the left upper deltoid area.mobility is limited because of pain and  left ankle  Neurological: She is alert and oriented to person, place, and time.  Skin: Skin is warm and dry. No rash noted.  Psychiatric: She has a normal mood and affect. Her behavior is normal. Judgment and thought content normal.  Nursing note and vitals reviewed.  BP 123/75 mmHg  Pulse 75  Temp(Src) 98.2 F (36.8 C) (Oral)  Ht 5\' 6"  (1.676 m)  Wt 188 lb (85.276 kg)  BMI 30.36 kg/m2  [facility] reading  (PRIMARY) by  Dr. Pietro Cassis fracture of the left lateral malleolus and calcific tendinitis of left shoulder                                  60 of Depo-Medrol was injected into the area of the left deltoid that was most tender without complication.      Assessment & Plan:   1. Left shoulder pain - DG Shoulder Left; Future  2. Left ankle pain - DG Ankle Complete Left; Future  3. Fracture of left ankle, lateral malleolus, closed, initial encounter  4. Calcific tendinitis of left shoulder region  5. Deltoid tendinitis, left -60 mg of Depo-Medrol injected  to Tenderness area  Patient Instructions  Wear fracture shoe regularly Elevate left foot as much as possible Take the pain medication that you already have been prescribed as needed for pain Use ice for 24 hours and then warm wet compresses to left ankle and use warm wet compresses to left shoulder Return to clinic in 2 weeks for recheck of ankle We will give you a prescription for a fracture shoe or boot that you must wear on your left foot Try to stop smoking    Arrie Senate MD

## 2014-08-18 NOTE — Patient Instructions (Signed)
Wear fracture shoe regularly Elevate left foot as much as possible Take the pain medication that you already have been prescribed as needed for pain Use ice for 24 hours and then warm wet compresses to left ankle and use warm wet compresses to left shoulder Return to clinic in 2 weeks for recheck of ankle We will give you a prescription for a fracture shoe or boot that you must wear on your left foot Try to stop smoking

## 2014-08-19 ENCOUNTER — Other Ambulatory Visit: Payer: Self-pay | Admitting: Family Medicine

## 2014-08-19 ENCOUNTER — Telehealth: Payer: Self-pay

## 2014-08-19 DIAGNOSIS — T148XXA Other injury of unspecified body region, initial encounter: Secondary | ICD-10-CM

## 2014-08-19 NOTE — Telephone Encounter (Signed)
-----   Message from Chipper Herb, MD sent at 08/19/2014  6:31 AM EST ----- As per radiology report--------to further evaluate this patient's shoulder pain, please get a left shoulder MRI

## 2014-08-19 NOTE — Telephone Encounter (Signed)
Pt aware of results and the need for an MRI; She prefers AP and one day next week

## 2014-08-26 ENCOUNTER — Telehealth: Payer: Self-pay

## 2014-08-26 ENCOUNTER — Ambulatory Visit (HOSPITAL_COMMUNITY)
Admission: RE | Admit: 2014-08-26 | Discharge: 2014-08-26 | Disposition: A | Discharge status: Home / Self Care | Payer: Medicare Other | Source: Ambulatory Visit | Attending: Family Medicine | Admitting: Family Medicine

## 2014-08-26 DIAGNOSIS — M25512 Pain in left shoulder: Secondary | ICD-10-CM | POA: Diagnosis present

## 2014-08-26 DIAGNOSIS — G8929 Other chronic pain: Secondary | ICD-10-CM

## 2014-08-26 DIAGNOSIS — M545 Low back pain: Principal | ICD-10-CM

## 2014-08-26 DIAGNOSIS — T148XXA Other injury of unspecified body region, initial encounter: Secondary | ICD-10-CM

## 2014-08-26 MED ORDER — HYDROCODONE-ACETAMINOPHEN 10-325 MG PO TABS: 1.0000 | ORAL_TABLET | Freq: Three times a day (TID) | ORAL | Status: DC | PRN

## 2014-08-26 NOTE — Telephone Encounter (Signed)
Patient is requesting a refill on hydrocodone. She is almost out. Patient states that she has had to take a few extra due to having a broken foot. Please advise

## 2014-08-26 NOTE — Telephone Encounter (Signed)
Hydrocodone rx ready for pick up but cannot take more than 3x a day

## 2014-08-26 NOTE — Telephone Encounter (Signed)
Pt aware of MRI results 

## 2014-08-26 NOTE — Telephone Encounter (Signed)
-----   Message from Chevis Pretty, [place] sent at 08/26/2014  3:09 PM EST ----- No fracture or tears visible

## 2014-08-27 NOTE — Telephone Encounter (Signed)
Pt aware rx ready

## 2014-09-02 ENCOUNTER — Encounter: Payer: Self-pay | Admitting: Family Medicine

## 2014-09-02 ENCOUNTER — Ambulatory Visit (INDEPENDENT_AMBULATORY_CARE_PROVIDER_SITE_OTHER): Payer: Medicare Other | Admitting: Family Medicine

## 2014-09-02 VITALS — BP 101/62 | HR 72 | Temp 96.8°F | Ht 65.0 in | Wt 185.0 lb

## 2014-09-02 DIAGNOSIS — M25512 Pain in left shoulder: Secondary | ICD-10-CM

## 2014-09-02 DIAGNOSIS — S82402D Unspecified fracture of shaft of left fibula, subsequent encounter for closed fracture with routine healing: Secondary | ICD-10-CM

## 2014-09-02 NOTE — Progress Notes (Signed)
Subjective:    Patient ID: [name], female    DOB: [birth year], 68 y.o.   MRN: [id number]  HPI Patient here today for 2 week follow up on left ankle/ foot pain and the left shoulder pain. Both areas are improved. The MRI done of the left shoulder did not show any definite fracture. There is some supraspinatus tendinosis with some calcific component but no sign of any rotator cuff tear. These x-rays  results will be reviewed with the patient today. There was a nondisplaced fracture of the left fibula.         Patient Active Problem List   Diagnosis Date Noted  . Acute bronchitis 11/26/2012  . COPD (chronic obstructive pulmonary disease) with chronic bronchitis 11/26/2012  . Smokes tobacco daily 11/26/2012  . Anxiety 11/26/2012  . Hypokalemia 11/26/2012  . Benign hypertension 11/26/2012  . Chronic lower back pain 11/26/2012  . GERD (gastroesophageal reflux disease) 12/29/2011  . Pyloric stenosis, acquired 12/29/2011  . PUD (peptic ulcer disease) 11/09/2011  . Chronic diarrhea 11/09/2011   Outpatient Encounter Prescriptions as of 09/02/2014  Medication Sig  . ALPRAZolam (XANAX) 0.5 MG tablet Take 1 tablet (0.5 mg total) by mouth 3 (three) times daily as needed. Anxiety  . calcium carbonate (OS-CAL) 600 MG TABS Take 600 mg by mouth daily.   . chlorzoxazone (PARAFON) 500 MG tablet TAKE ONE TABLET BY MOUTH THREE TIMES DAILY AS NEEDED  . Cholecalciferol (VITAMIN D3) 1000 UNITS CAPS Take 3,000 mg by mouth daily.   Marland Kitchen dicyclomine (BENTYL) 10 MG capsule Take 1 capsule (10 mg total) by mouth 4 (four) times daily as needed (Irritable bowl syndrome).  Marland Kitchen diphenhydrAMINE (BENADRYL) 25 MG tablet Take 25 mg by mouth 2 (two) times daily as needed. For Allergies  . Elastic Bandages & Supports (POST-OP SHOE/SOFT TOP WOMEN) MISC Use post- operative shoe as directed to left foot.  Marland Kitchen HYDROcodone-acetaminophen (NORCO) 10-325 MG per tablet Take 1 tablet by mouth every 8 (eight) hours as needed.  For Pain  . lisinopril-hydrochlorothiazide (PRINZIDE,ZESTORETIC) 10-12.5 MG per tablet TAKE ONE TABLET BY MOUTH ONE TIME DAILY  . Multiple Vitamin (MULITIVITAMIN WITH MINERALS) TABS Take 1 tablet by mouth daily.  . pantoprazole (PROTONIX) 40 MG tablet Take 1 tablet (40 mg total) by mouth daily.  Marland Kitchen PARoxetine (PAXIL) 20 MG tablet TAKE ONE TABLET BY MOUTH ONE TIME DAILY  . simvastatin (ZOCOR) 40 MG tablet TAKE ONE TABLET BY MOUTH AT BEDTIME  . vitamin C (ASCORBIC ACID) 500 MG tablet Take 1,000 mg by mouth daily.     Review of Systems  Constitutional: Negative.   HENT: Negative.   Eyes: Negative.   Respiratory: Negative.   Cardiovascular: Negative.   Gastrointestinal: Negative.   Endocrine: Negative.   Genitourinary: Negative.   Musculoskeletal: Positive for arthralgias (left ankle and left shoulder - doing better).  Skin: Negative.   Allergic/Immunologic: Negative.   Neurological: Negative.   Hematological: Negative.   Psychiatric/Behavioral: Negative.        Objective:   Physical Exam  Constitutional: She is oriented to person, place, and time. She appears well-developed and well-nourished. No distress.  Musculoskeletal: Normal range of motion. She exhibits no edema or tenderness.  The left distal fibular fracture appears to be healing well with minimal edema and no tenderness to palpation. The patient has better range of motion of her left shoulder.  Neurological: She is alert and oriented to person, place, and time.  Skin: Skin is warm. No rash noted.  Psychiatric:  She has a normal mood and affect. Her behavior is normal. Judgment and thought content normal.  Nursing note and vitals reviewed.  BP 101/62 mmHg  Pulse 72  Temp(Src) 96.8 F (36 C) (Oral)  Ht 5\' 5"  (1.651 m)  Wt 185 lb (83.915 kg)  BMI 30.79 kg/m2        Assessment & Plan:  1. Left fibular fracture, closed, with routine healing, subsequent encounter  2. Left shoulder pain  Patient Instructions    Continue to wear left ankle brace and wear a firm soled shoe for 4 more weeks. Be careful not to fall again and reinjured this ankle. Continue warm compresses to left shoulder and good range of motion activity.   Arrie Senate MD

## 2014-09-02 NOTE — Patient Instructions (Signed)
Continue to wear left ankle brace and wear a firm soled shoe for 4 more weeks. Be careful not to fall again and reinjured this ankle. Continue warm compresses to left shoulder and good range of motion activity.

## 2014-09-08 ENCOUNTER — Other Ambulatory Visit: Payer: Self-pay | Admitting: Nurse Practitioner

## 2014-09-08 NOTE — Telephone Encounter (Signed)
This is okay to refill 1 

## 2014-09-08 NOTE — Telephone Encounter (Signed)
Last ov 09/02/14. Last refill 08/12/14. Route to nurse pool. Call to [facility] if approved.

## 2014-09-23 ENCOUNTER — Telehealth: Payer: Self-pay | Admitting: Nurse Practitioner

## 2014-09-23 DIAGNOSIS — M545 Low back pain: Principal | ICD-10-CM

## 2014-09-23 DIAGNOSIS — G8929 Other chronic pain: Secondary | ICD-10-CM

## 2014-09-23 MED ORDER — HYDROCODONE-ACETAMINOPHEN 10-325 MG PO TABS: 1.0000 | ORAL_TABLET | Freq: Three times a day (TID) | ORAL | Status: DC | PRN

## 2014-09-23 NOTE — Telephone Encounter (Signed)
Hydrocodone rx ready for pick up 

## 2014-10-06 ENCOUNTER — Other Ambulatory Visit: Payer: Self-pay | Admitting: Family Medicine

## 2014-10-06 ENCOUNTER — Ambulatory Visit: Payer: Medicare Other | Admitting: Family Medicine

## 2014-10-06 NOTE — Telephone Encounter (Signed)
Last seen 09/02/14 [facility]   If approved route to nurse to call into [facility]

## 2014-10-07 ENCOUNTER — Ambulatory Visit (INDEPENDENT_AMBULATORY_CARE_PROVIDER_SITE_OTHER): Payer: Medicare Other | Admitting: Family Medicine

## 2014-10-07 ENCOUNTER — Ambulatory Visit (INDEPENDENT_AMBULATORY_CARE_PROVIDER_SITE_OTHER): Payer: Medicare Other

## 2014-10-07 ENCOUNTER — Encounter: Payer: Self-pay | Admitting: Family Medicine

## 2014-10-07 VITALS — BP 147/62 | HR 61 | Temp 98.3°F | Ht 65.0 in | Wt 189.0 lb

## 2014-10-07 DIAGNOSIS — S82402D Unspecified fracture of shaft of left fibula, subsequent encounter for closed fracture with routine healing: Secondary | ICD-10-CM

## 2014-10-07 DIAGNOSIS — S82892A Other fracture of left lower leg, initial encounter for closed fracture: Secondary | ICD-10-CM

## 2014-10-07 NOTE — Progress Notes (Signed)
Subjective:    Patient ID: [name], female    DOB: 19-Feb-1946, 69 y.o.   MRN: [id number]  HPI Patient here today for follow up on left ankle fracture. We will re- xray today to check healing. Was a left lateral malleolus fracture. This fracture was non-displaced.        Patient Active Problem List   Diagnosis Date Noted  . Acute bronchitis 11/26/2012  . COPD (chronic obstructive pulmonary disease) with chronic bronchitis 11/26/2012  . Smokes tobacco daily 11/26/2012  . Anxiety 11/26/2012  . Hypokalemia 11/26/2012  . Benign hypertension 11/26/2012  . Chronic lower back pain 11/26/2012  . GERD (gastroesophageal reflux disease) 12/29/2011  . Pyloric stenosis, acquired 12/29/2011  . PUD (peptic ulcer disease) 11/09/2011  . Chronic diarrhea 11/09/2011   Outpatient Encounter Prescriptions as of 10/07/2014  Medication Sig  . ALPRAZolam (XANAX) 0.5 MG tablet TAKE ONE TABLET BY MOUTH THREE TIMES DAILY  . calcium carbonate (OS-CAL) 600 MG TABS Take 600 mg by mouth daily.   . chlorzoxazone (PARAFON) 500 MG tablet TAKE ONE TABLET BY MOUTH THREE TIMES DAILY AS NEEDED  . Cholecalciferol (VITAMIN D3) 1000 UNITS CAPS Take 3,000 mg by mouth daily.   Marland Kitchen dicyclomine (BENTYL) 10 MG capsule Take 1 capsule (10 mg total) by mouth 4 (four) times daily as needed (Irritable bowl syndrome).  Marland Kitchen diphenhydrAMINE (BENADRYL) 25 MG tablet Take 25 mg by mouth 2 (two) times daily as needed. For Allergies  . Elastic Bandages & Supports (POST-OP SHOE/SOFT TOP WOMEN) MISC Use post- operative shoe as directed to left foot.  Marland Kitchen HYDROcodone-acetaminophen (NORCO) 10-325 MG per tablet Take 1 tablet by mouth every 8 (eight) hours as needed. For Pain  . lisinopril-hydrochlorothiazide (PRINZIDE,ZESTORETIC) 10-12.5 MG per tablet TAKE ONE TABLET BY MOUTH ONE TIME DAILY  . Multiple Vitamin (MULITIVITAMIN WITH MINERALS) TABS Take 1 tablet by mouth daily.  . pantoprazole (PROTONIX) 40 MG tablet Take 1 tablet (40 mg  total) by mouth daily.  Marland Kitchen PARoxetine (PAXIL) 20 MG tablet TAKE ONE TABLET BY MOUTH ONE TIME DAILY  . simvastatin (ZOCOR) 40 MG tablet TAKE ONE TABLET BY MOUTH AT BEDTIME  . vitamin C (ASCORBIC ACID) 500 MG tablet Take 1,000 mg by mouth daily.      Review of Systems  Constitutional: Negative.   HENT: Negative.   Eyes: Negative.   Respiratory: Negative.   Cardiovascular: Negative.   Gastrointestinal: Negative.   Endocrine: Negative.   Genitourinary: Negative.   Musculoskeletal: Positive for arthralgias (left ankle soreness - is better).  Skin: Negative.   Allergic/Immunologic: Negative.   Neurological: Negative.   Hematological: Negative.   Psychiatric/Behavioral: Negative.        Objective:   Physical Exam  BP 147/62 mmHg  Pulse 61  Temp(Src) 98.3 F (36.8 C) (Oral)  Ht 5\' 5"  (1.651 m)  Wt 189 lb (85.73 kg)  BMI 31.45 kg/m2  The patient is alert and cooperative. She has been extremely busy and not being able to take care of her ankle as she would like to have. She has just been wearing an ankle support. Her husband has been very sick with 1 disease. She says her ankle is feeling better but it is still giving her some discomfort. On physical exam there is minimal swelling and minimal tenderness to palpation. She appeared to be walking on it without much discomfort. The x-ray that was done in the office does not show any significant healing.   [facility] reading (PRIMARY) by  Dr.Moore-left  ankle--minimal healing of previous fracture                                                                   Assessment & Plan:  1. Ankle fracture, left - DG Ankle Complete Left; Future - Ambulatory referral to Orthopedic Surgery  2. Left fibular fracture, closed, with routine healing, subsequent encounter  Patient Instructions  Continue to wear your support brace as much as possible We will make a referral to Dr. Sanjuana Kava to follow-up the ankle x-ray because of apparent  nonhealing of this fracture. Please be careful and do not put yourself at risk for falling and re-injuring this   Arrie Senate MD

## 2014-10-07 NOTE — Patient Instructions (Signed)
Continue to wear your support brace as much as possible We will make a referral to Dr. Sanjuana Kava to follow-up the ankle x-ray because of apparent nonhealing of this fracture. Please be careful and do not put yourself at risk for falling and re-injuring this

## 2014-10-13 ENCOUNTER — Telehealth: Payer: Self-pay | Admitting: Family Medicine

## 2014-10-13 NOTE — Telephone Encounter (Signed)
Patient needs her referral after 1/25 since husband is having cataract surgery on 1/18. I notified the referral coordinator.

## 2014-10-16 DIAGNOSIS — I10 Essential (primary) hypertension: Secondary | ICD-10-CM | POA: Diagnosis not present

## 2014-10-16 DIAGNOSIS — Z720 Tobacco use: Secondary | ICD-10-CM | POA: Diagnosis not present

## 2014-10-16 DIAGNOSIS — S82302G Unspecified fracture of lower end of left tibia, subsequent encounter for closed fracture with delayed healing: Secondary | ICD-10-CM | POA: Diagnosis not present

## 2014-10-20 ENCOUNTER — Other Ambulatory Visit: Payer: Self-pay | Admitting: Nurse Practitioner

## 2014-10-20 DIAGNOSIS — G8929 Other chronic pain: Secondary | ICD-10-CM

## 2014-10-20 DIAGNOSIS — M545 Low back pain: Principal | ICD-10-CM

## 2014-10-20 MED ORDER — HYDROCODONE-ACETAMINOPHEN 10-325 MG PO TABS: 1.0000 | ORAL_TABLET | Freq: Three times a day (TID) | ORAL | Status: DC | PRN

## 2014-10-20 NOTE — Telephone Encounter (Signed)
Pt aware.

## 2014-10-20 NOTE — Telephone Encounter (Signed)
norco rx ready for pick up Cannot fill till 10/12/14

## 2014-10-31 ENCOUNTER — Encounter: Payer: Self-pay | Admitting: Family

## 2014-10-31 ENCOUNTER — Ambulatory Visit (INDEPENDENT_AMBULATORY_CARE_PROVIDER_SITE_OTHER): Payer: Medicare Other | Admitting: Family

## 2014-10-31 VITALS — BP 110/65 | HR 71 | Temp 97.2°F | Ht 65.0 in | Wt 187.0 lb

## 2014-10-31 DIAGNOSIS — R05 Cough: Secondary | ICD-10-CM | POA: Diagnosis not present

## 2014-10-31 DIAGNOSIS — J069 Acute upper respiratory infection, unspecified: Secondary | ICD-10-CM

## 2014-10-31 DIAGNOSIS — B379 Candidiasis, unspecified: Secondary | ICD-10-CM

## 2014-10-31 DIAGNOSIS — R059 Cough, unspecified: Secondary | ICD-10-CM

## 2014-10-31 MED ORDER — AZITHROMYCIN 250 MG PO TABS: ORAL_TABLET | ORAL | Status: DC

## 2014-10-31 MED ORDER — BENZONATATE 200 MG PO CAPS: 200.0000 mg | ORAL_CAPSULE | Freq: Three times a day (TID) | ORAL | Status: DC | PRN

## 2014-10-31 MED ORDER — FLUCONAZOLE 150 MG PO TABS: 150.0000 mg | ORAL_TABLET | Freq: Once | ORAL | Status: DC

## 2014-10-31 NOTE — Patient Instructions (Signed)
Upper Respiratory Infection, Adult An upper respiratory infection (URI) is also sometimes known as the common cold. The upper respiratory tract includes the nose, sinuses, throat, trachea, and bronchi. Bronchi are the airways leading to the lungs. Most people improve within 1 week, but symptoms can last up to 2 weeks. A residual cough may last even longer.  CAUSES Many different viruses can infect the tissues lining the upper respiratory tract. The tissues become irritated and inflamed and often become very moist. Mucus production is also common. A cold is contagious. You can easily spread the virus to others by oral contact. This includes kissing, sharing a glass, coughing, or sneezing. Touching your mouth or nose and then touching a surface, which is then touched by another person, can also spread the virus. SYMPTOMS  Symptoms typically develop 1 to 3 days after you come in contact with a cold virus. Symptoms vary from person to person. They may include:  Runny nose.  Sneezing.  Nasal congestion.  Sinus irritation.  Sore throat.  Loss of voice (laryngitis).  Cough.  Fatigue.  Muscle aches.  Loss of appetite.  Headache.  Low-grade fever. DIAGNOSIS  You might diagnose your own cold based on familiar symptoms, since most people get a cold 2 to 3 times a year. Your caregiver can confirm this based on your exam. Most importantly, your caregiver can check that your symptoms are not due to another disease such as strep throat, sinusitis, pneumonia, asthma, or epiglottitis. Blood tests, throat tests, and X-rays are not necessary to diagnose a common cold, but they may sometimes be helpful in excluding other more serious diseases. Your caregiver will decide if any further tests are required. RISKS AND COMPLICATIONS  You may be at risk for a more severe case of the common cold if you smoke cigarettes, have chronic heart disease (such as heart failure) or lung disease (such as asthma), or if  you have a weakened immune system. The very young and very old are also at risk for more serious infections. Bacterial sinusitis, middle ear infections, and bacterial pneumonia can complicate the common cold. The common cold can worsen asthma and chronic obstructive pulmonary disease (COPD). Sometimes, these complications can require emergency medical care and may be life-threatening. PREVENTION  The best way to protect against getting a cold is to practice good hygiene. Avoid oral or hand contact with people with cold symptoms. Wash your hands often if contact occurs. There is no clear evidence that vitamin C, vitamin E, echinacea, or exercise reduces the chance of developing a cold. However, it is always recommended to get plenty of rest and practice good nutrition. TREATMENT  Treatment is directed at relieving symptoms. There is no cure. Antibiotics are not effective, because the infection is caused by a virus, not by bacteria. Treatment may include:  Increased fluid intake. Sports drinks offer valuable electrolytes, sugars, and fluids.  Breathing heated mist or steam (vaporizer or shower).  Eating chicken soup or other clear broths, and maintaining good nutrition.  Getting plenty of rest.  Using gargles or lozenges for comfort.  Controlling fevers with ibuprofen or acetaminophen as directed by your caregiver.  Increasing usage of your inhaler if you have asthma. Zinc gel and zinc lozenges, taken in the first 24 hours of the common cold, can shorten the duration and lessen the severity of symptoms. Pain medicines may help with fever, muscle aches, and throat pain. A variety of non-prescription medicines are available to treat congestion and runny nose. Your caregiver   can make recommendations and may suggest nasal or lung inhalers for other symptoms.  HOME CARE INSTRUCTIONS   Only take over-the-counter or prescription medicines for pain, discomfort, or fever as directed by your  caregiver.  Use a warm mist humidifier or inhale steam from a shower to increase air moisture. This may keep secretions moist and make it easier to breathe.  Drink enough water and fluids to keep your urine clear or pale yellow.  Rest as needed.  Return to work when your temperature has returned to normal or as your caregiver advises. You may need to stay home longer to avoid infecting others. You can also use a face mask and careful hand washing to prevent spread of the virus. SEEK MEDICAL CARE IF:   After the first few days, you feel you are getting worse rather than better.  You need your caregiver's advice about medicines to control symptoms.  You develop chills, worsening shortness of breath, or brown or red sputum. These may be signs of pneumonia.  You develop yellow or brown nasal discharge or pain in the face, especially when you bend forward. These may be signs of sinusitis.  You develop a fever, swollen neck glands, pain with swallowing, or white areas in the back of your throat. These may be signs of strep throat. SEEK IMMEDIATE MEDICAL CARE IF:   You have a fever.  You develop severe or persistent headache, ear pain, sinus pain, or chest pain.  You develop wheezing, a prolonged cough, cough up blood, or have a change in your usual mucus (if you have chronic lung disease).  You develop sore muscles or a stiff neck. Document Released: 03/15/2001 Document Revised: 12/12/2011 Document Reviewed: 12/25/2013 ExitCare Patient Information 2015 [employer]. This information is not intended to replace advice given to you by your health care provider. Make sure you discuss any questions you have with your health care provider.  

## 2014-10-31 NOTE — Progress Notes (Signed)
Subjective:    Patient ID: [name], female    DOB: Feb 18, 1946, 69 y.o.   MRN: [id number]  Sore Throat  This is a new problem. The current episode started yesterday. The problem has been waxing and waning. The pain is worse on the left side. There has been no fever. The pain is at a severity of 4/10. The pain is moderate. Associated symptoms include congestion, coughing, diarrhea, ear pain, a hoarse voice and trouble swallowing. Pertinent negatives include no ear discharge, headaches or shortness of breath. Treatments tried: Benadryl. The treatment provided mild relief.      Review of Systems  Constitutional: Negative.   HENT: Positive for congestion, ear pain, hoarse voice and trouble swallowing. Negative for ear discharge.   Eyes: Negative.   Respiratory: Positive for cough. Negative for shortness of breath.   Cardiovascular: Negative.  Negative for palpitations.  Gastrointestinal: Positive for diarrhea.  Endocrine: Negative.   Genitourinary: Negative.   Musculoskeletal: Negative.   Neurological: Negative.  Negative for headaches.  Hematological: Negative.   Psychiatric/Behavioral: Negative.   All other systems reviewed and are negative.      Objective:   Physical Exam  Constitutional: She is oriented to person, place, and time. She appears well-developed and well-nourished. No distress.  HENT:  Head: Normocephalic and atraumatic.  Right Ear: External ear normal.  Left Ear: External ear normal.  Nasal passage erythemas with mild swelling  Oropharynx erythemas  Eyes: Pupils are equal, round, and reactive to light.  Neck: Normal range of motion. Neck supple. No thyromegaly present.  Cardiovascular: Normal rate, regular rhythm, normal heart sounds and intact distal pulses.   No murmur heard. Pulmonary/Chest: Effort normal and breath sounds normal. No respiratory distress. She has no wheezes.  Abdominal: Soft. Bowel sounds are normal. She exhibits no distension.  There is no tenderness.  Musculoskeletal: Normal range of motion. She exhibits no edema or tenderness.  Neurological: She is alert and oriented to person, place, and time. She has normal reflexes. No cranial nerve deficit.  Skin: Skin is warm and dry.  Psychiatric: She has a normal mood and affect. Her behavior is normal. Judgment and thought content normal.  Vitals reviewed.    BP 110/65 mmHg  Pulse 71  Temp(Src) 97.2 F (36.2 C) (Oral)  Ht 5\' 5"  (1.651 m)  Wt 187 lb (84.823 kg)  BMI 31.12 kg/m2      Assessment & Plan:  1. Cough - azithromycin (ZITHROMAX) 250 MG tablet; Take 500 mg once, then 250 mg for four days  Dispense: 6 tablet; Refill: 0 - benzonatate (TESSALON) 200 MG capsule; Take 1 capsule (200 mg total) by mouth 3 (three) times daily as needed.  Dispense: 30 capsule; Refill: 1  2. Acute upper respiratory infection -- Take meds as prescribed - Use a cool mist humidifier  -Use saline nose sprays frequently -Saline irrigations of the nose can be very helpful if done frequently.  * 4X daily for 1 week*  * Use of a nettie pot can be helpful with this. Follow directions with this* -Force fluids -For any cough or congestion  Use plain Mucinex- regular strength or max strength is fine   * Children- consult with Pharmacist for dosing -For fever or aces or pains- take tylenol or ibuprofen appropriate for age and weight.  * for fevers greater than 101 orally you may alternate ibuprofen and tylenol every  3 hours. -Throat lozenges if help - azithromycin (ZITHROMAX) 250 MG tablet; Take 500 mg once,  then 250 mg for four days  Dispense: 6 tablet; Refill: 0  3. Yeast infection - fluconazole (DIFLUCAN) 150 MG tablet; Take 1 tablet (150 mg total) by mouth once.  Dispense: 1 tablet; Refill: 0  Evelina Dun, FNP

## 2014-11-04 ENCOUNTER — Other Ambulatory Visit: Payer: Self-pay | Admitting: Nurse Practitioner

## 2014-11-05 NOTE — Telephone Encounter (Signed)
Last filled 10/07/14, last seen 06/21/14, Call into [facility]

## 2014-11-05 NOTE — Telephone Encounter (Signed)
rx called into pharmacy

## 2014-11-05 NOTE — Telephone Encounter (Signed)
Please call in xanax with 1 refills 

## 2014-11-18 ENCOUNTER — Other Ambulatory Visit: Payer: Self-pay | Admitting: Nurse Practitioner

## 2014-11-18 NOTE — Telephone Encounter (Signed)
Left message on pharmacy voicemail that it is too early to fill

## 2014-11-18 NOTE — Telephone Encounter (Signed)
Pt requesting refill on her Norco, last filled 11/01/14 #90 no refills take Q 8hours prn for pain,

## 2014-11-18 NOTE — Telephone Encounter (Signed)
To early for refill  

## 2014-11-24 ENCOUNTER — Telehealth: Payer: Self-pay | Admitting: Nurse Practitioner

## 2014-11-24 DIAGNOSIS — M545 Low back pain: Principal | ICD-10-CM

## 2014-11-24 DIAGNOSIS — G8929 Other chronic pain: Secondary | ICD-10-CM

## 2014-11-24 MED ORDER — HYDROCODONE-ACETAMINOPHEN 10-325 MG PO TABS: 1.0000 | ORAL_TABLET | Freq: Three times a day (TID) | ORAL | Status: DC | PRN

## 2014-11-24 NOTE — Telephone Encounter (Signed)
lortab rx ready for pick up  

## 2014-11-24 NOTE — Telephone Encounter (Signed)
Pt aware.

## 2014-12-04 ENCOUNTER — Other Ambulatory Visit: Payer: Self-pay | Admitting: Nurse Practitioner

## 2014-12-21 ENCOUNTER — Other Ambulatory Visit: Payer: Self-pay | Admitting: Nurse Practitioner

## 2014-12-22 ENCOUNTER — Telehealth: Payer: Self-pay | Admitting: Nurse Practitioner

## 2014-12-22 DIAGNOSIS — G8929 Other chronic pain: Secondary | ICD-10-CM

## 2014-12-22 DIAGNOSIS — M545 Low back pain: Principal | ICD-10-CM

## 2014-12-22 MED ORDER — HYDROCODONE-ACETAMINOPHEN 10-325 MG PO TABS: 1.0000 | ORAL_TABLET | Freq: Three times a day (TID) | ORAL | Status: DC | PRN

## 2014-12-22 NOTE — Telephone Encounter (Signed)
Pt aware of refill ready for pickup at office

## 2014-12-22 NOTE — Telephone Encounter (Signed)
Pain med rx ready for pick up

## 2015-01-01 ENCOUNTER — Other Ambulatory Visit: Payer: Self-pay | Admitting: Nurse Practitioner

## 2015-01-01 ENCOUNTER — Other Ambulatory Visit: Payer: Self-pay | Admitting: Family

## 2015-01-02 NOTE — Telephone Encounter (Signed)
Please call in xanax with 1 refills 

## 2015-01-02 NOTE — Telephone Encounter (Signed)
Patient last seen in office on 10-31-14 for an acute visit. Rx last filled on 11-05-14 for #90. Please advise

## 2015-01-03 NOTE — Telephone Encounter (Signed)
rx called into pharmacy

## 2015-01-15 ENCOUNTER — Ambulatory Visit: Payer: Medicare Other | Admitting: *Deleted

## 2015-01-17 ENCOUNTER — Other Ambulatory Visit: Payer: Self-pay | Admitting: Family Medicine

## 2015-01-20 ENCOUNTER — Telehealth: Payer: Self-pay | Admitting: *Deleted

## 2015-01-20 ENCOUNTER — Encounter: Payer: Self-pay | Admitting: Nurse Practitioner

## 2015-01-20 ENCOUNTER — Telehealth: Payer: Self-pay | Admitting: Nurse Practitioner

## 2015-01-20 DIAGNOSIS — M545 Low back pain: Principal | ICD-10-CM

## 2015-01-20 DIAGNOSIS — G8929 Other chronic pain: Secondary | ICD-10-CM

## 2015-01-20 MED ORDER — HYDROCODONE-ACETAMINOPHEN 10-325 MG PO TABS: 1.0000 | ORAL_TABLET | Freq: Three times a day (TID) | ORAL | Status: DC | PRN

## 2015-01-20 NOTE — Telephone Encounter (Signed)
Patient needs something stating that they need a taller commode since they live at [facility] and it is hard for them to get up and down with there health problems.

## 2015-01-20 NOTE — Telephone Encounter (Signed)
rx ready for pickup 

## 2015-01-20 NOTE — Telephone Encounter (Signed)
Letter ready for pick up

## 2015-01-20 NOTE — Telephone Encounter (Signed)
Patient aware rx is ready to be picked up 

## 2015-01-20 NOTE — Telephone Encounter (Signed)
Patient came in to office and picked up rxs

## 2015-01-20 NOTE — Telephone Encounter (Signed)
Patient wants to know if you can increase her alprazolam. She is having a hard time right now her husband is not doing good. Please advise

## 2015-01-20 NOTE — Telephone Encounter (Signed)
Patient is wanting to know if you will increase her xanax she is having a hard time right now with her husband being sick. Please advise

## 2015-01-20 NOTE — Telephone Encounter (Signed)
If we increase to 1mg  can only take BID. What does she want to do

## 2015-01-24 ENCOUNTER — Other Ambulatory Visit: Payer: Self-pay | Admitting: Nurse Practitioner

## 2015-01-31 ENCOUNTER — Other Ambulatory Visit: Payer: Self-pay | Admitting: Family

## 2015-02-23 ENCOUNTER — Encounter: Payer: Self-pay | Admitting: Nurse Practitioner

## 2015-02-23 ENCOUNTER — Ambulatory Visit (INDEPENDENT_AMBULATORY_CARE_PROVIDER_SITE_OTHER): Payer: Medicare Other | Admitting: Nurse Practitioner

## 2015-02-23 VITALS — BP 137/76 | HR 73 | Temp 98.6°F | Ht 65.0 in | Wt 188.6 lb

## 2015-02-23 DIAGNOSIS — E785 Hyperlipidemia, unspecified: Secondary | ICD-10-CM | POA: Diagnosis not present

## 2015-02-23 DIAGNOSIS — F329 Major depressive disorder, single episode, unspecified: Secondary | ICD-10-CM | POA: Insufficient documentation

## 2015-02-23 DIAGNOSIS — Z720 Tobacco use: Secondary | ICD-10-CM

## 2015-02-23 DIAGNOSIS — E876 Hypokalemia: Secondary | ICD-10-CM

## 2015-02-23 DIAGNOSIS — I10 Essential (primary) hypertension: Secondary | ICD-10-CM | POA: Diagnosis not present

## 2015-02-23 DIAGNOSIS — K219 Gastro-esophageal reflux disease without esophagitis: Secondary | ICD-10-CM | POA: Diagnosis not present

## 2015-02-23 DIAGNOSIS — G8929 Other chronic pain: Secondary | ICD-10-CM

## 2015-02-23 DIAGNOSIS — F419 Anxiety disorder, unspecified: Secondary | ICD-10-CM | POA: Diagnosis not present

## 2015-02-23 DIAGNOSIS — J449 Chronic obstructive pulmonary disease, unspecified: Secondary | ICD-10-CM

## 2015-02-23 DIAGNOSIS — M545 Low back pain: Secondary | ICD-10-CM | POA: Diagnosis not present

## 2015-02-23 DIAGNOSIS — F17200 Nicotine dependence, unspecified, uncomplicated: Secondary | ICD-10-CM

## 2015-02-23 DIAGNOSIS — F32A Depression, unspecified: Secondary | ICD-10-CM

## 2015-02-23 MED ORDER — HYDROCODONE-ACETAMINOPHEN 10-325 MG PO TABS: 1.0000 | ORAL_TABLET | Freq: Three times a day (TID) | ORAL | Status: DC | PRN

## 2015-02-23 MED ORDER — ALPRAZOLAM 1 MG PO TABS: 1.0000 mg | ORAL_TABLET | Freq: Two times a day (BID) | ORAL | Status: DC | PRN

## 2015-02-23 MED ORDER — LISINOPRIL-HYDROCHLOROTHIAZIDE 10-12.5 MG PO TABS: 1.0000 | ORAL_TABLET | Freq: Every day | ORAL | Status: DC

## 2015-02-23 MED ORDER — SIMVASTATIN 40 MG PO TABS: 40.0000 mg | ORAL_TABLET | Freq: Every day | ORAL | Status: DC

## 2015-02-23 MED ORDER — PAROXETINE HCL 20 MG PO TABS: 20.0000 mg | ORAL_TABLET | Freq: Every day | ORAL | Status: DC

## 2015-02-23 NOTE — Progress Notes (Signed)
Subjective:    Patient ID: [name], female    DOB: [birth year], 69 y.o.   MRN: [id number]  Patient here today for follow up of chronic medical problems. husband was just diagnosed with leukemia. Hypertension This is a chronic problem. The current episode started more than 1 year ago. The problem is unchanged. The problem is controlled. Pertinent negatives include no chest pain, headaches, palpitations or shortness of breath. Risk factors for coronary artery disease include dyslipidemia, family history, post-menopausal state and sedentary lifestyle. Past treatments include ACE inhibitors and diuretics. The current treatment provides moderate improvement. Compliance problems include diet and exercise.   Hyperlipidemia This is a chronic problem. The current episode started more than 1 year ago. Recent lipid tests were reviewed and are variable. Pertinent negatives include no chest pain or shortness of breath. Current antihyperlipidemic treatment includes statins. The current treatment provides moderate improvement of lipids. Compliance problems include adherence to diet and adherence to exercise.  Risk factors for coronary artery disease include dyslipidemia, hypertension and post-menopausal.  GAD/depression Currently takes Paxil 64m qd and xanax .528m1 tid.  Has stress at home taking care of husband who is recovering from lung cancer and now haleukemia. COPD/smoking Currently smokes 1 pack per day. Just not interested in stopping right now.  Is not on an inhaler due to cost. GERD Currently takes Protonix q day with good results.   Chronic back pain Currently taking Hydrocodone 10/32537m po tid for relief of lumbar pain and right leg pain.   hypokalemia K supplements daily- no problems Review of Systems  Constitutional: Negative.   Respiratory: Negative.  Negative for shortness of breath.   Cardiovascular: Negative.  Negative for chest pain and palpitations.  Gastrointestinal:  Negative.   Musculoskeletal: Positive for back pain.          Skin: Negative.   Neurological: Negative.  Negative for headaches.  Psychiatric/Behavioral: Negative.        Objective:   Physical Exam  Constitutional: She is oriented to person, place, and time. She appears well-developed and well-nourished.  HENT:  Nose: Nose normal.  Mouth/Throat: Oropharynx is clear and moist.  Eyes: EOM are normal.  Neck: Trachea normal, normal range of motion and full passive range of motion without pain. Neck supple. No JVD present. Carotid bruit is not present. No thyromegaly present.  Cardiovascular: Normal rate, regular rhythm, normal heart sounds and intact distal pulses.  Exam reveals no gallop and no friction rub.   No murmur heard. Pulmonary/Chest: Effort normal and breath sounds normal.  Abdominal: Soft. Bowel sounds are normal. She exhibits no distension and no mass. There is no tenderness.  Musculoskeletal:       Lumbar back: She exhibits decreased range of motion and pain.  Lymphadenopathy:    She has no cervical adenopathy.  Neurological: She is alert and oriented to person, place, and time. She has normal reflexes.  Skin: Skin is warm and dry.  Psychiatric: She has a normal mood and affect. Her behavior is normal. Judgment and thought content normal.   BP 137/76 mmHg  Pulse 73  Temp(Src) 98.6 F (37 C) (Oral)  Ht 5' 5" (1.651 m)  Wt 188 lb 9.6 oz (85.548 kg)  BMI 31.38 kg/m2        Assessment & Plan:   1. Benign hypertension Do noot add slat to diet - lisinopril-hydrochlorothiazide (PRINZIDE,ZESTORETIC) 10-12.5 MG per tablet; Take 1 tablet by mouth daily.  Dispense: 90 tablet; Refill: 0 - CMP14+EGFR  2. Gastroesophageal reflux disease without esophagitis Avoid spicy foods Do not eat 2 hours prior to bedtime   3. Smokes tobacco daily Smoking cesssation encouraged  4. Hypokalemia  5. Chronic lower back pain Rest stetching - HYDROcodone-acetaminophen (NORCO)  10-325 MG per tablet; Take 1 tablet by mouth every 8 (eight) hours as needed. For Pain  Dispense: 90 tablet; Refill: 0  6. Anxiety Stress management increased dose of xanax  - ALPRAZolam (XANAX) 1 MG tablet; Take 1 tablet (1 mg total) by mouth 2 (two) times daily as needed for anxiety.  Dispense: 60 tablet; Refill: 1  7. Depression - PARoxetine (PAXIL) 20 MG tablet; Take 1 tablet (20 mg total) by mouth daily.  Dispense: 30 tablet; Refill: 0  8. COPD (chronic obstructive pulmonary disease) with chronic bronchitis  9. Hyperlipidemia with target LDL less than 100 Low fat diet - simvastatin (ZOCOR) 40 MG tablet; Take 1 tablet (40 mg total) by mouth at bedtime.  Dispense: 30 tablet; Refill: 0 - NMR, lipoprofile   Refuses zostavax and tetanus today Labs pending Health maintenance reviewed Diet and exercise encouraged Continue all meds Follow up  In 3 month   [place], FNP

## 2015-02-23 NOTE — Patient Instructions (Signed)

## 2015-02-24 LAB — CMP14+EGFR
A/G RATIO: 2 (ref 1.1–2.5)
ALK PHOS: 67 IU/L (ref 39–117)
ALT: 11 IU/L (ref 0–32)
AST: 26 IU/L (ref 0–40)
Albumin: 4.2 g/dL (ref 3.6–4.8)
BUN/Creatinine Ratio: 11 (ref 11–26)
BUN: 7 mg/dL — ABNORMAL LOW (ref 8–27)
Bilirubin Total: 0.3 mg/dL (ref 0.0–1.2)
CALCIUM: 9.6 mg/dL (ref 8.7–10.3)
CHLORIDE: 95 mmol/L — AB (ref 97–108)
CO2: 28 mmol/L (ref 18–29)
Creatinine, Ser: 0.61 mg/dL (ref 0.57–1.00)
GFR calc Af Amer: 108 mL/min/{1.73_m2} (ref >59)
GFR calc non Af Amer: 94 mL/min/{1.73_m2} (ref >59)
GLUCOSE: 76 mg/dL (ref 65–99)
Globulin, Total: 2.1 g/dL (ref 1.5–4.5)
Potassium: 3.8 mmol/L (ref 3.5–5.2)
SODIUM: 137 mmol/L (ref 134–144)
TOTAL PROTEIN: 6.3 g/dL (ref 6.0–8.5)

## 2015-02-24 LAB — NMR, LIPOPROFILE
CHOLESTEROL: 157 mg/dL (ref 100–199)
HDL CHOLESTEROL BY NMR: 64 mg/dL (ref >39)
HDL Particle Number: 41.6 umol/L (ref >=30.5)
LDL Particle Number: 691 nmol/L (ref <1000)
LDL SIZE: 20.4 nm (ref >20.5)
LDL-C: 63 mg/dL (ref 0–99)
LP-IR Score: 44 (ref <=45)
Small LDL Particle Number: 326 nmol/L (ref <=527)
Triglycerides by NMR: 150 mg/dL — ABNORMAL HIGH (ref 0–149)

## 2015-03-17 ENCOUNTER — Other Ambulatory Visit: Payer: Self-pay | Admitting: Nurse Practitioner

## 2015-03-17 DIAGNOSIS — G8929 Other chronic pain: Secondary | ICD-10-CM

## 2015-03-17 DIAGNOSIS — M545 Low back pain: Principal | ICD-10-CM

## 2015-03-17 MED ORDER — HYDROCODONE-ACETAMINOPHEN 10-325 MG PO TABS: 1.0000 | ORAL_TABLET | Freq: Three times a day (TID) | ORAL | Status: DC | PRN

## 2015-03-17 NOTE — Telephone Encounter (Signed)
RX ready for pick up 

## 2015-03-17 NOTE — Telephone Encounter (Signed)
Patient aware rx is ready to be picked up 

## 2015-03-17 NOTE — Telephone Encounter (Signed)
Patient of MMM. Last seen in office on 02-23-15. Rx last filled on 5-23 for #90. Please advise. If approved pleas print and route to Pool A so nurse can call patient to pick up

## 2015-03-30 ENCOUNTER — Telehealth: Payer: Self-pay | Admitting: Family Medicine

## 2015-03-30 NOTE — Telephone Encounter (Signed)
Patient wants to know if you will give her a injection in her shoulder?

## 2015-03-30 NOTE — Telephone Encounter (Signed)
Patient aware and appointment scheduled.  

## 2015-03-30 NOTE — Telephone Encounter (Signed)
i can do shoulder injection- when did have last injection?

## 2015-03-31 ENCOUNTER — Ambulatory Visit (INDEPENDENT_AMBULATORY_CARE_PROVIDER_SITE_OTHER): Payer: Medicare Other | Admitting: Nurse Practitioner

## 2015-03-31 ENCOUNTER — Encounter: Payer: Self-pay | Admitting: Nurse Practitioner

## 2015-03-31 VITALS — BP 123/76 | HR 68 | Temp 98.5°F | Ht 65.0 in | Wt 187.9 lb

## 2015-03-31 DIAGNOSIS — F419 Anxiety disorder, unspecified: Secondary | ICD-10-CM

## 2015-03-31 DIAGNOSIS — M25512 Pain in left shoulder: Secondary | ICD-10-CM | POA: Diagnosis not present

## 2015-03-31 MED ORDER — BUPIVACAINE HCL 0.25 % IJ SOLN
1.0000 mL | Freq: Once | INTRAMUSCULAR | Status: AC
  Administered 2015-03-31: 1 mL

## 2015-03-31 MED ORDER — ALPRAZOLAM 1 MG PO TABS: 1.0000 mg | ORAL_TABLET | Freq: Three times a day (TID) | ORAL | Status: DC | PRN

## 2015-03-31 MED ORDER — METHYLPREDNISOLONE ACETATE 40 MG/ML IJ SUSP
40.0000 mg | Freq: Once | INTRAMUSCULAR | Status: AC
  Administered 2015-03-31: 40 mg via INTRA_ARTICULAR

## 2015-03-31 NOTE — Patient Instructions (Signed)
Joint Injection  Care After  Refer to this sheet in the next few days. These instructions provide you with information on caring for yourself after you have had a joint injection. Your caregiver also may give you more specific instructions. Your treatment has been planned according to current medical practices, but problems sometimes occur. Call your caregiver if you have any problems or questions after your procedure.  After any type of joint injection, it is not uncommon to experience:  · Soreness, swelling, or bruising around the injection site.  · Mild numbness, tingling, or weakness around the injection site caused by the numbing medicine used before or with the injection.  It also is possible to experience the following effects associated with the specific agent after injection:  · Iodine-based contrast agents:  ¨ Allergic reaction (itching, hives, widespread redness, and swelling beyond the injection site).  · Corticosteroids (These effects are rare.):  ¨ Allergic reaction.  ¨ Increased blood sugar levels (If you have diabetes and you notice that your blood sugar levels have increased, notify your caregiver).  ¨ Increased blood pressure levels.  ¨ Mood swings.  · Hyaluronic acid in the use of viscosupplementation.  ¨ Temporary heat or redness.  ¨ Temporary rash and itching.  ¨ Increased fluid accumulation in the injected joint.  These effects all should resolve within a day after your procedure.   HOME CARE INSTRUCTIONS  · Limit yourself to light activity the day of your procedure. Avoid lifting heavy objects, bending, stooping, or twisting.  · Take prescription or over-the-counter pain medication as directed by your caregiver.  · You may apply ice to your injection site to reduce pain and swelling the day of your procedure. Ice may be applied 03-04 times:  ¨ Put ice in a plastic bag.  ¨ Place a towel between your skin and the bag.  ¨ Leave the ice on for no longer than 15-20 minutes each time.  SEEK  IMMEDIATE MEDICAL CARE IF:   · Pain and swelling get worse rather than better or extend beyond the injection site.  · Numbness does not go away.  · Blood or fluid continues to leak from the injection site.  · You have chest pain.  · You have swelling of your face or tongue.  · You have trouble breathing or you become dizzy.  · You develop a fever, chills, or severe tenderness at the injection site that last longer than 1 day.  MAKE SURE YOU:  · Understand these instructions.  · Watch your condition.  · Get help right away if you are not doing well or if you get worse.  Document Released: 06/02/2011 Document Revised: 12/12/2011 Document Reviewed: 06/02/2011  ExitCare® Patient Information ©2015 [employer]. This information is not intended to replace advice given to you by your health care provider. Make sure you discuss any questions you have with your health care provider.

## 2015-03-31 NOTE — Progress Notes (Signed)
   Subjective:    Patient ID: [name], female    DOB: [birth year], 69 y.o.   MRN: [id number]  HPI Patient in today c/o left shoulder pain- saw Dr. Laurance Flatten last November and he injected it with steroid- pain started to return about 2 weeks ago and is gradually worsening. SHe is here to have injected again. Her husband is dying form lung disease and she is having to help get him up and down- That has really irritated her shoulder.  * She is also takes xanax 1mg  BID and needs to increase to TID while husband is sick.  Review of Systems  Constitutional: Negative.   HENT: Negative.   Respiratory: Negative.   Cardiovascular: Negative.   Genitourinary: Negative.   Neurological: Negative.   Psychiatric/Behavioral: Negative.   All other systems reviewed and are negative.      Objective:   Physical Exam  Constitutional: She is oriented to person, place, and time. She appears well-developed and well-nourished.  Cardiovascular: Normal rate, regular rhythm and normal heart sounds.   Pulmonary/Chest: Effort normal and breath sounds normal.  Musculoskeletal:  FROM of left shoulder with pain on internal rotation.  Grips equal bil Motor strength and sensation distally intact  Neurological: She is alert and oriented to person, place, and time.  Skin: Skin is warm and dry.  Psychiatric: She has a normal mood and affect. Her behavior is normal. Judgment and thought content normal.    BP 123/76 mmHg  Pulse 68  Temp(Src) 98.5 F (36.9 C) (Oral)  Ht 5\' 5"  (1.651 m)  Wt 187 lb 14.4 oz (85.231 kg)  BMI 31.27 kg/m2  Procedure:  Betadine prep posterior shoulder  Marcaine 0.25 % 13ml with depomedrol 40mg  54ml injected left shoulder  Tolerated well       Assessment & Plan:  1. Pain in joint, shoulder region, left Ice Rest if hurting - bupivacaine (MARCAINE) 0.25 % (with pres) injection 1 mL; 1 mL by Infiltration route once. - methylPREDNISolone acetate (DEPO-MEDROL) injection 40 mg;  Inject 1 mL (40 mg total) into the articular space once.  2. Anxiety Stress management - ALPRAZolam (XANAX) 1 MG tablet; Take 1 tablet (1 mg total) by mouth 3 (three) times daily as needed for anxiety.  Dispense: 90 tablet; Refill: [place], FNP

## 2015-04-10 ENCOUNTER — Other Ambulatory Visit: Payer: Self-pay | Admitting: Nurse Practitioner

## 2015-04-10 DIAGNOSIS — Z780 Asymptomatic menopausal state: Secondary | ICD-10-CM

## 2015-04-12 ENCOUNTER — Other Ambulatory Visit: Payer: Self-pay | Admitting: Family Medicine

## 2015-04-12 ENCOUNTER — Other Ambulatory Visit: Payer: Self-pay | Admitting: Nurse Practitioner

## 2015-04-16 ENCOUNTER — Other Ambulatory Visit: Payer: Self-pay | Admitting: Nurse Practitioner

## 2015-04-16 DIAGNOSIS — G8929 Other chronic pain: Secondary | ICD-10-CM

## 2015-04-16 DIAGNOSIS — M545 Low back pain: Principal | ICD-10-CM

## 2015-04-16 MED ORDER — HYDROCODONE-ACETAMINOPHEN 10-325 MG PO TABS: 1.0000 | ORAL_TABLET | Freq: Three times a day (TID) | ORAL | Status: DC | PRN

## 2015-05-14 ENCOUNTER — Telehealth: Payer: Self-pay | Admitting: Nurse Practitioner

## 2015-05-14 DIAGNOSIS — M545 Low back pain: Principal | ICD-10-CM

## 2015-05-14 DIAGNOSIS — G8929 Other chronic pain: Secondary | ICD-10-CM

## 2015-05-14 MED ORDER — HYDROCODONE-ACETAMINOPHEN 10-325 MG PO TABS: 1.0000 | ORAL_TABLET | Freq: Three times a day (TID) | ORAL | Status: DC | PRN

## 2015-05-14 NOTE — Telephone Encounter (Signed)
lmovm that written Rx is at front desk ready for pickup 

## 2015-05-14 NOTE — Telephone Encounter (Signed)
Pain rx ready for pick up  

## 2015-06-12 ENCOUNTER — Other Ambulatory Visit: Payer: Medicare Other | Admitting: Nurse Practitioner

## 2015-06-13 ENCOUNTER — Other Ambulatory Visit: Payer: Self-pay | Admitting: Nurse Practitioner

## 2015-06-23 ENCOUNTER — Ambulatory Visit (INDEPENDENT_AMBULATORY_CARE_PROVIDER_SITE_OTHER): Payer: Medicare Other

## 2015-06-23 ENCOUNTER — Encounter (INDEPENDENT_AMBULATORY_CARE_PROVIDER_SITE_OTHER): Payer: Self-pay

## 2015-06-23 ENCOUNTER — Ambulatory Visit (INDEPENDENT_AMBULATORY_CARE_PROVIDER_SITE_OTHER): Payer: Medicare Other | Admitting: Nurse Practitioner

## 2015-06-23 ENCOUNTER — Encounter: Payer: Self-pay | Admitting: Nurse Practitioner

## 2015-06-23 VITALS — BP 138/77 | HR 71 | Temp 97.8°F | Ht 65.0 in | Wt 188.8 lb

## 2015-06-23 DIAGNOSIS — I10 Essential (primary) hypertension: Secondary | ICD-10-CM | POA: Diagnosis not present

## 2015-06-23 DIAGNOSIS — J449 Chronic obstructive pulmonary disease, unspecified: Secondary | ICD-10-CM

## 2015-06-23 DIAGNOSIS — K219 Gastro-esophageal reflux disease without esophagitis: Secondary | ICD-10-CM | POA: Diagnosis not present

## 2015-06-23 DIAGNOSIS — F17200 Nicotine dependence, unspecified, uncomplicated: Secondary | ICD-10-CM

## 2015-06-23 DIAGNOSIS — F329 Major depressive disorder, single episode, unspecified: Secondary | ICD-10-CM | POA: Diagnosis not present

## 2015-06-23 DIAGNOSIS — F32A Depression, unspecified: Secondary | ICD-10-CM

## 2015-06-23 DIAGNOSIS — E876 Hypokalemia: Secondary | ICD-10-CM | POA: Diagnosis not present

## 2015-06-23 DIAGNOSIS — Z780 Asymptomatic menopausal state: Secondary | ICD-10-CM

## 2015-06-23 DIAGNOSIS — M545 Low back pain, unspecified: Secondary | ICD-10-CM

## 2015-06-23 DIAGNOSIS — Z6832 Body mass index (BMI) 32.0-32.9, adult: Secondary | ICD-10-CM

## 2015-06-23 DIAGNOSIS — Z720 Tobacco use: Secondary | ICD-10-CM

## 2015-06-23 DIAGNOSIS — F419 Anxiety disorder, unspecified: Secondary | ICD-10-CM | POA: Diagnosis not present

## 2015-06-23 DIAGNOSIS — G8929 Other chronic pain: Secondary | ICD-10-CM

## 2015-06-23 DIAGNOSIS — J4489 Other specified chronic obstructive pulmonary disease: Secondary | ICD-10-CM

## 2015-06-23 DIAGNOSIS — E785 Hyperlipidemia, unspecified: Secondary | ICD-10-CM | POA: Diagnosis not present

## 2015-06-23 MED ORDER — LISINOPRIL-HYDROCHLOROTHIAZIDE 10-12.5 MG PO TABS: 1.0000 | ORAL_TABLET | Freq: Every day | ORAL | Status: DC

## 2015-06-23 MED ORDER — SIMVASTATIN 40 MG PO TABS: 40.0000 mg | ORAL_TABLET | Freq: Every day | ORAL | Status: DC

## 2015-06-23 MED ORDER — ALPRAZOLAM 1 MG PO TABS: 1.0000 mg | ORAL_TABLET | Freq: Three times a day (TID) | ORAL | Status: DC | PRN

## 2015-06-23 MED ORDER — PANTOPRAZOLE SODIUM 40 MG PO TBEC: 40.0000 mg | DELAYED_RELEASE_TABLET | Freq: Every day | ORAL | Status: DC

## 2015-06-23 MED ORDER — PAROXETINE HCL 20 MG PO TABS: 20.0000 mg | ORAL_TABLET | Freq: Every day | ORAL | Status: DC

## 2015-06-23 MED ORDER — HYDROCODONE-ACETAMINOPHEN 10-325 MG PO TABS: 1.0000 | ORAL_TABLET | Freq: Three times a day (TID) | ORAL | Status: DC | PRN

## 2015-06-23 NOTE — Patient Instructions (Signed)
Bone Health Our bones do many things. They provide structure, protect organs, anchor muscles, and store calcium. Adequate calcium in your diet and weight-bearing physical activity help build strong bones, improve bone amounts, and may reduce the risk of weakening of bones (osteoporosis) later in life. PEAK BONE MASS By age 68, the average woman has acquired most of her skeletal bone mass. A large decline occurs in older adults which increases the risk of osteoporosis. In women this occurs around the time of menopause. It is important for young girls to reach their peak bone mass in order to maintain bone health throughout life. A person with high bone mass as a young adult will be more likely to have a higher bone mass later in life. Not enough calcium consumption and physical activity early on could result in a failure to achieve optimum bone mass in adulthood. OSTEOPOROSIS Osteoporosis is a disease of the bones. It is defined as low bone mass with deterioration of bone structure. Osteoporosis leads to an increase risk of fractures with falls. These fractures commonly happen in the wrist, hip, and spine. While men and women of all ages and background can develop osteoporosis, some of the risk factors for osteoporosis are:  Female.  White.  Postmenopausal.  Older adults.  Small in body size.  Eating a diet low in calcium.  Physically inactive.  Smoking.  Use of some medications.  Family history. CALCIUM Calcium is a mineral needed by the body for healthy bones, teeth, and proper function of the heart, muscles, and nerves. The body cannot produce calcium so it must be absorbed through food. Good sources of calcium include:  Dairy products (low fat or nonfat milk, cheese, and yogurt).  Dark green leafy vegetables (bok choy and broccoli).  Calcium fortified foods (orange juice, cereal, bread, soy beverages, and tofu products).  Nuts (almonds). Recommended amounts of calcium vary  for individuals. RECOMMENDED CALCIUM INTAKES Age and Amount in mg per day  Children 1 to 3 years / 700 mg  Children 4 to 8 years / 1,000 mg  Children 9 to 13 years / 1,300 mg  Teens 14 to 18 years / 1,300 mg  Adults 19 to 50 years / 1,000 mg  Adult women 51 to 70 years / 1,200 mg  Adults 71 years and older / 1,200 mg  Pregnant and breastfeeding teens / 1,300 mg  Pregnant and breastfeeding adults / 1,000 mg Vitamin D also plays an important role in healthy bone development. Vitamin D helps in the absorption of calcium. WEIGHT-BEARING PHYSICAL ACTIVITY Regular physical activity has many positive health benefits. Benefits include strong bones. Weight-bearing physical activity early in life is important in reaching peak bone mass. Weight-bearing physical activities cause muscles and bones to work against gravity. Some examples of weight bearing physical activities include:  Walking, jogging, or running.  Field Hockey.  Jumping rope.  Dancing.  Soccer.  Tennis or Racquetball.  Stair climbing.  Basketball.  Hiking.  Weight lifting.  Aerobic fitness classes. Including weight-bearing physical activity into an exercise plan is a great way to keep bones healthy. Adults: Engage in at least 30 minutes of moderate physical activity on most, preferably all, days of the week. Children: Engage in at least 60 minutes of moderate physical activity on most, preferably all, days of the week. FOR MORE INFORMATION [place] Department of Agriculture, Center for Nutrition Policy and Promotion: www.cnpp.usda.gov National Osteoporosis Foundation: www.nof.org Document Released: 12/10/2003 Document Revised: 01/14/2013 Document Reviewed: 03/11/2009 ExitCare Patient Information   2015 [employer]. This information is not intended to replace advice given to you by your health care provider. Make sure you discuss any questions you have with your health care provider.  

## 2015-06-23 NOTE — Progress Notes (Signed)
Subjective:    Patient ID: [name], female    DOB: 1946/07/15, 69 y.o.   MRN: [id number]  Patient here today for follow up of chronic medical problems.Husband has passed away since her last visit    Hypertension This is a chronic problem. The current episode started more than 1 year ago. The problem is unchanged. The problem is controlled. Pertinent negatives include no chest pain, headaches, palpitations or shortness of breath. Risk factors for coronary artery disease include dyslipidemia, family history, post-menopausal state and sedentary lifestyle. Past treatments include ACE inhibitors and diuretics. The current treatment provides moderate improvement. Compliance problems include diet and exercise.   Hyperlipidemia This is a chronic problem. The current episode started more than 1 year ago. Recent lipid tests were reviewed and are variable. Pertinent negatives include no chest pain or shortness of breath. Current antihyperlipidemic treatment includes statins. The current treatment provides moderate improvement of lipids. Compliance problems include adherence to diet and adherence to exercise.  Risk factors for coronary artery disease include dyslipidemia, hypertension and post-menopausal.  GAD/depression Currently takes Paxil 20mg  qd and xanax .5mg  1 tid.  Has stress at home taking care of husband who is recovering from lung cancer and now haleukemia. COPD/smoking Currently smokes 1 pack per day. Just not interested in stopping right now.  Is not on an inhaler due to cost. GERD Currently takes Protonix q day with good results.   Chronic back pain Currently taking Hydrocodone 10/325mg  1 po tid for relief of lumbar pain and right leg pain.   hypokalemia K supplements daily- no problems   Review of Systems  Constitutional: Negative.   Respiratory: Negative.  Negative for shortness of breath.   Cardiovascular: Negative.  Negative for chest pain and palpitations.    Gastrointestinal: Negative.   Musculoskeletal: Positive for back pain.          Skin: Negative.   Neurological: Negative.  Negative for headaches.  Psychiatric/Behavioral: Negative.        Objective:   Physical Exam  Constitutional: She is oriented to person, place, and time. She appears well-developed and well-nourished.  HENT:  Nose: Nose normal.  Mouth/Throat: Oropharynx is clear and moist.  Eyes: EOM are normal.  Neck: Trachea normal, normal range of motion and full passive range of motion without pain. Neck supple. No JVD present. Carotid bruit is not present. No thyromegaly present.  Cardiovascular: Normal rate, regular rhythm, normal heart sounds and intact distal pulses.  Exam reveals no gallop and no friction rub.   No murmur heard. Pulmonary/Chest: Effort normal and breath sounds normal.  Abdominal: Soft. Bowel sounds are normal. She exhibits no distension and no mass. There is no tenderness.  Musculoskeletal:       Lumbar back: She exhibits decreased range of motion and pain.  Lymphadenopathy:    She has no cervical adenopathy.  Neurological: She is alert and oriented to person, place, and time. She has normal reflexes.  Skin: Skin is warm and dry.  Psychiatric: She has a normal mood and affect. Her behavior is normal. Judgment and thought content normal.   BP 138/77 mmHg  Pulse 71  Temp(Src) 97.8 F (36.6 C) (Oral)  Ht 5\' 5"  (1.651 m)  Wt 188 lb 12.8 oz (85.639 kg)  BMI 31.42 kg/m2   Chest x ray- bronchitic changes-Preliminary reading by Ronnald Collum, FNP  [facility]   EKG- Kerry Hough, FNP      Assessment & Plan:  1. Benign hypertension Do not add salt  to diet - lisinopril-hydrochlorothiazide (PRINZIDE,ZESTORETIC) 10-12.5 MG per tablet; Take 1 tablet by mouth daily.  Dispense: 90 tablet; Refill: 0 - CMP14+EGFR - EKG 12-Lead  2. COPD (chronic obstructive pulmonary disease) with chronic bronchitis Stop smoking  3. Gastroesophageal reflux  disease without esophagitis Avoid spicy foods Do not eat 2 hours prior to bedtime - pantoprazole (PROTONIX) 40 MG tablet; Take 1 tablet (40 mg total) by mouth daily.  Dispense: 30 tablet; Refill: 5  4. Smokes tobacco daily Stop smoking - DG Chest 2 View; Future 5. Anxiety Stress management - ALPRAZolam (XANAX) 1 MG tablet; Take 1 tablet (1 mg total) by mouth 3 (three) times daily as needed for anxiety.  Dispense: 90 tablet; Refill: 1  6. Hypokalemia  7. Depression - PARoxetine (PAXIL) 20 MG tablet; Take 1 tablet (20 mg total) by mouth daily.  Dispense: 30 tablet; Refill: 5  8. Chronic lower back pain - HYDROcodone-acetaminophen (NORCO) 10-325 MG per tablet; Take 1 tablet by mouth every 8 (eight) hours as needed. For Pain  Dispense: 90 tablet; Refill: 0  9. Hyperlipidemia with target LDL less than 100 Low fat diet - simvastatin (ZOCOR) 40 MG tablet; Take 1 tablet (40 mg total) by mouth at bedtime.  Dispense: 30 tablet; Refill: 5 - Lipid panel  10. BMI 32.0-32.9,adult Discussed diet and exercise for person with BMI >25 Will recheck weight in 3-6 months    Labs pending Health maintenance reviewed Diet and exercise encouraged Continue all meds Follow up  In 3 months   [place], FNP

## 2015-06-24 LAB — LIPID PANEL
Chol/HDL Ratio: 2.4 ratio units (ref 0.0–4.4)
Cholesterol, Total: 161 mg/dL (ref 100–199)
HDL: 67 mg/dL (ref >39)
LDL CALC: 46 mg/dL (ref 0–99)
Triglycerides: 239 mg/dL — ABNORMAL HIGH (ref 0–149)
VLDL CHOLESTEROL CAL: 48 mg/dL — AB (ref 5–40)

## 2015-06-24 LAB — CMP14+EGFR
ALBUMIN: 4.3 g/dL (ref 3.6–4.8)
ALT: 11 IU/L (ref 0–32)
AST: 14 IU/L (ref 0–40)
Albumin/Globulin Ratio: 2 (ref 1.1–2.5)
Alkaline Phosphatase: 64 IU/L (ref 39–117)
BUN/Creatinine Ratio: 8 — ABNORMAL LOW (ref 11–26)
BUN: 4 mg/dL — AB (ref 8–27)
CALCIUM: 10 mg/dL (ref 8.7–10.3)
CHLORIDE: 96 mmol/L — AB (ref 97–108)
CO2: 30 mmol/L — AB (ref 18–29)
CREATININE: 0.52 mg/dL — AB (ref 0.57–1.00)
GFR, EST AFRICAN AMERICAN: 114 mL/min/{1.73_m2} (ref >59)
GFR, EST NON AFRICAN AMERICAN: 99 mL/min/{1.73_m2} (ref >59)
GLUCOSE: 87 mg/dL (ref 65–99)
Globulin, Total: 2.1 g/dL (ref 1.5–4.5)
Potassium: 4.5 mmol/L (ref 3.5–5.2)
Sodium: 139 mmol/L (ref 134–144)
TOTAL PROTEIN: 6.4 g/dL (ref 6.0–8.5)

## 2015-07-15 ENCOUNTER — Other Ambulatory Visit: Payer: Self-pay | Admitting: Nurse Practitioner

## 2015-07-15 DIAGNOSIS — G8929 Other chronic pain: Secondary | ICD-10-CM

## 2015-07-15 DIAGNOSIS — M545 Low back pain: Principal | ICD-10-CM

## 2015-07-15 MED ORDER — HYDROCODONE-ACETAMINOPHEN 10-325 MG PO TABS: 1.0000 | ORAL_TABLET | Freq: Three times a day (TID) | ORAL | Status: DC | PRN

## 2015-07-15 NOTE — Telephone Encounter (Signed)
Pain rx ready for pick up  

## 2015-07-15 NOTE — Telephone Encounter (Signed)
Detailed message left that rx is ready to be picked up.  

## 2015-08-13 ENCOUNTER — Other Ambulatory Visit: Payer: Self-pay | Admitting: Nurse Practitioner

## 2015-08-13 DIAGNOSIS — G8929 Other chronic pain: Secondary | ICD-10-CM

## 2015-08-13 DIAGNOSIS — F419 Anxiety disorder, unspecified: Secondary | ICD-10-CM

## 2015-08-13 DIAGNOSIS — M545 Low back pain: Principal | ICD-10-CM

## 2015-08-13 MED ORDER — HYDROCODONE-ACETAMINOPHEN 10-325 MG PO TABS: 1.0000 | ORAL_TABLET | Freq: Three times a day (TID) | ORAL | Status: DC | PRN

## 2015-08-13 MED ORDER — ALPRAZOLAM 1 MG PO TABS: 1.0000 mg | ORAL_TABLET | Freq: Three times a day (TID) | ORAL | Status: DC | PRN

## 2015-08-13 NOTE — Telephone Encounter (Signed)
Left detailed message that rx are ready for pick up and to CB with any further questions or concerns.

## 2015-08-13 NOTE — Telephone Encounter (Signed)
rx ready for pickup 

## 2015-08-21 ENCOUNTER — Other Ambulatory Visit: Payer: Self-pay | Admitting: Nurse Practitioner

## 2015-08-25 ENCOUNTER — Encounter: Payer: Self-pay | Admitting: Family Medicine

## 2015-08-25 ENCOUNTER — Ambulatory Visit (INDEPENDENT_AMBULATORY_CARE_PROVIDER_SITE_OTHER): Payer: Medicare Other | Admitting: Family Medicine

## 2015-08-25 VITALS — BP 110/71 | HR 67 | Temp 97.3°F | Ht 65.0 in | Wt 197.0 lb

## 2015-08-25 DIAGNOSIS — J00 Acute nasopharyngitis [common cold]: Secondary | ICD-10-CM

## 2015-08-25 MED ORDER — BENZONATATE 100 MG PO CAPS: 100.0000 mg | ORAL_CAPSULE | Freq: Three times a day (TID) | ORAL | Status: DC | PRN

## 2015-08-25 MED ORDER — FLUTICASONE PROPIONATE 50 MCG/ACT NA SUSP: 2.0000 | Freq: Every day | NASAL | Status: DC

## 2015-08-25 NOTE — Progress Notes (Signed)
   HPI  Patient presents today here with cold symptoms.  Patient's when she's had one day of nasal congestion, rhinorrhea, headache, and cough. She denies any dyspnea She denies any fever, malaise She's tolerating food and fluids easily. She's been taking Benadryl which has helped her runny nose but otherwise hasn't really helped much.   PMH: Smoking status noted ROS: Per HPI  Objective: BP 110/71 mmHg  Pulse 67  Temp(Src) 97.3 F (36.3 C) (Oral)  Ht 5\' 5"  (1.651 m)  Wt 197 lb (89.359 kg)  BMI 32.78 kg/m2 Gen: NAD, alert, cooperative with exam HEENT: NCAT, TMs with some scarring but no acute changes, nares slightly swollen and erythematous, oropharynx clear Neck: No tender lymphadenopathy CV: RRR, good S1/S2, no murmur Resp: CTABL, no wheezes, non-labored Ext: No edema, warm Neuro: Alert and oriented, No gross deficits  Assessment and plan:  # Common cold, upper respiratory infection Symptoms currently at one day consistent with viral infection No increased dyspnea, however she does have increased cough. She does have COPD so I explained to her that she needs to be very cautious if she develops worsening productive cough or dyspnea to come back. For now I don't think that steroids or antibiotics are appropriate. Treat with Flonase and Tessalon Perles Come back if worsening or does not resolve   Meds ordered this encounter  Medications  . benzonatate (TESSALON) 100 MG capsule    Sig: Take 1 capsule (100 mg total) by mouth 3 (three) times daily as needed for cough.    Dispense:  20 capsule    Refill:  0  . fluticasone (FLONASE) 50 MCG/ACT nasal spray    Sig: Place 2 sprays into both nostrils daily.    Dispense:  16 g    Refill:  Albert Lea, MD [facility] 08/25/2015, 3:17 PM

## 2015-08-25 NOTE — Patient Instructions (Addendum)
Great to meet you!  Upper Respiratory Infection, Adult Most upper respiratory infections (URIs) are caused by a virus. A URI affects the nose, throat, and upper air passages. The most common type of URI is often called "the common cold." HOME CARE   Take medicines only as told by your doctor.  Gargle warm saltwater or take cough drops to comfort your throat as told by your doctor.  Use a warm mist humidifier or inhale steam from a shower to increase air moisture. This may make it easier to breathe.  Drink enough fluid to keep your pee (urine) clear or pale yellow.  Eat soups and other clear broths.  Have a healthy diet.  Rest as needed.  Go back to work when your fever is gone or your doctor says it is okay.  You may need to stay home longer to avoid giving your URI to others.  You can also wear a face mask and wash your hands often to prevent spread of the virus.  Use your inhaler more if you have asthma.  Do not use any tobacco products, including cigarettes, chewing tobacco, or electronic cigarettes. If you need help quitting, ask your doctor. GET HELP IF:  You are getting worse, not better.  Your symptoms are not helped by medicine.  You have chills.  You are getting more short of breath.  You have brown or red mucus.  You have yellow or brown discharge from your nose.  You have pain in your face, especially when you bend forward.  You have a fever.  You have puffy (swollen) neck glands.  You have pain while swallowing.  You have white areas in the back of your throat. GET HELP RIGHT AWAY IF:   You have very bad or constant:  Headache.  Ear pain.  Pain in your forehead, behind your eyes, and over your cheekbones (sinus pain).  Chest pain.  You have long-lasting (chronic) lung disease and any of the following:  Wheezing.  Long-lasting cough.  Coughing up blood.  A change in your usual mucus.  You have a stiff neck.  You have changes in  your:  Vision.  Hearing.  Thinking.  Mood. MAKE SURE YOU:   Understand these instructions.  Will watch your condition.  Will get help right away if you are not doing well or get worse.   This information is not intended to replace advice given to you by your health care provider. Make sure you discuss any questions you have with your health care provider.   Document Released: 03/07/2008 Document Revised: 02/03/2015 Document Reviewed: 12/25/2013 Elsevier Interactive Patient Education [employer].

## 2015-09-14 ENCOUNTER — Telehealth: Payer: Self-pay | Admitting: Nurse Practitioner

## 2015-09-14 DIAGNOSIS — M545 Low back pain, unspecified: Secondary | ICD-10-CM

## 2015-09-14 DIAGNOSIS — F419 Anxiety disorder, unspecified: Secondary | ICD-10-CM

## 2015-09-14 DIAGNOSIS — G8929 Other chronic pain: Secondary | ICD-10-CM

## 2015-09-14 MED ORDER — ALPRAZOLAM 1 MG PO TABS: 1.0000 mg | ORAL_TABLET | Freq: Three times a day (TID) | ORAL | Status: DC | PRN

## 2015-09-14 MED ORDER — HYDROCODONE-ACETAMINOPHEN 10-325 MG PO TABS: 1.0000 | ORAL_TABLET | Freq: Three times a day (TID) | ORAL | Status: DC | PRN

## 2015-09-14 NOTE — Telephone Encounter (Signed)
norco and xanax rx ready for pick up

## 2015-09-22 ENCOUNTER — Encounter: Payer: Self-pay | Admitting: Nurse Practitioner

## 2015-09-22 ENCOUNTER — Ambulatory Visit (INDEPENDENT_AMBULATORY_CARE_PROVIDER_SITE_OTHER): Payer: Medicare Other | Admitting: Nurse Practitioner

## 2015-09-22 VITALS — BP 136/77 | HR 75 | Temp 97.5°F | Ht 65.0 in | Wt 187.0 lb

## 2015-09-22 DIAGNOSIS — F419 Anxiety disorder, unspecified: Secondary | ICD-10-CM

## 2015-09-22 DIAGNOSIS — K219 Gastro-esophageal reflux disease without esophagitis: Secondary | ICD-10-CM | POA: Diagnosis not present

## 2015-09-22 DIAGNOSIS — I10 Essential (primary) hypertension: Secondary | ICD-10-CM | POA: Diagnosis not present

## 2015-09-22 DIAGNOSIS — F32A Depression, unspecified: Secondary | ICD-10-CM

## 2015-09-22 DIAGNOSIS — J449 Chronic obstructive pulmonary disease, unspecified: Secondary | ICD-10-CM | POA: Diagnosis not present

## 2015-09-22 DIAGNOSIS — G8929 Other chronic pain: Secondary | ICD-10-CM

## 2015-09-22 DIAGNOSIS — M545 Low back pain: Secondary | ICD-10-CM

## 2015-09-22 DIAGNOSIS — Z720 Tobacco use: Secondary | ICD-10-CM

## 2015-09-22 DIAGNOSIS — E785 Hyperlipidemia, unspecified: Secondary | ICD-10-CM | POA: Diagnosis not present

## 2015-09-22 DIAGNOSIS — F17200 Nicotine dependence, unspecified, uncomplicated: Secondary | ICD-10-CM

## 2015-09-22 DIAGNOSIS — F329 Major depressive disorder, single episode, unspecified: Secondary | ICD-10-CM | POA: Diagnosis not present

## 2015-09-22 DIAGNOSIS — Z6832 Body mass index (BMI) 32.0-32.9, adult: Secondary | ICD-10-CM | POA: Diagnosis not present

## 2015-09-22 DIAGNOSIS — E876 Hypokalemia: Secondary | ICD-10-CM

## 2015-09-22 MED ORDER — PAROXETINE HCL 20 MG PO TABS: 20.0000 mg | ORAL_TABLET | Freq: Every day | ORAL | Status: DC

## 2015-09-22 MED ORDER — PANTOPRAZOLE SODIUM 40 MG PO TBEC: 40.0000 mg | DELAYED_RELEASE_TABLET | Freq: Every day | ORAL | Status: DC

## 2015-09-22 MED ORDER — SIMVASTATIN 40 MG PO TABS: 40.0000 mg | ORAL_TABLET | Freq: Every day | ORAL | Status: DC

## 2015-09-22 MED ORDER — HYDROCODONE-ACETAMINOPHEN 10-325 MG PO TABS: 1.0000 | ORAL_TABLET | Freq: Three times a day (TID) | ORAL | Status: DC | PRN

## 2015-09-22 MED ORDER — LISINOPRIL-HYDROCHLOROTHIAZIDE 10-12.5 MG PO TABS: 1.0000 | ORAL_TABLET | Freq: Every day | ORAL | Status: DC

## 2015-09-22 NOTE — Addendum Note (Signed)
Addended by: Chevis Pretty on: 09/22/2015 12:45 PM   Modules accepted: Orders

## 2015-09-22 NOTE — Patient Instructions (Signed)

## 2015-09-22 NOTE — Progress Notes (Signed)
 Subjective:    Patient ID: [name], female    DOB: 05/12/1946, 69 y.o.   MRN: [id number]  Patient here today for follow up of chronic medical problems.Husband has passed away since her last visit    Hypertension This is a chronic problem. The current episode started more than 1 year ago. The problem is unchanged. The problem is controlled. Pertinent negatives include no chest pain, headaches, palpitations or shortness of breath. Risk factors for coronary artery disease include dyslipidemia, family history, post-menopausal state and sedentary lifestyle. Past treatments include ACE inhibitors and diuretics. The current treatment provides moderate improvement. Compliance problems include diet and exercise.   Hyperlipidemia This is a chronic problem. The current episode started more than 1 year ago. Recent lipid tests were reviewed and are variable. Pertinent negatives include no chest pain or shortness of breath. Current antihyperlipidemic treatment includes statins. The current treatment provides moderate improvement of lipids. Compliance problems include adherence to diet and adherence to exercise.  Risk factors for coronary artery disease include dyslipidemia, hypertension and post-menopausal.  GAD/depression Currently takes Paxil 20mg qd and xanax .5mg 1 tid.  Has stress at home taking care of husband who is recovering from lung cancer and now haleukemia. COPD/smoking Currently smokes 1 pack per day. Just not interested in stopping right now.  Is not on an inhaler due to cost. GERD Currently takes Protonix q day with good results.   Chronic back pain Currently taking Hydrocodone 10/325mg 1 po tid for relief of lumbar pain and right leg pain.   hypokalemia K supplements daily- no problems   Review of Systems  Constitutional: Negative.   Respiratory: Negative.  Negative for shortness of breath.   Cardiovascular: Negative.  Negative for chest pain and palpitations.    Gastrointestinal: Negative.   Musculoskeletal: Positive for back pain.          Skin: Negative.   Neurological: Negative.  Negative for headaches.  Psychiatric/Behavioral: Negative.        Objective:   Physical Exam  Constitutional: She is oriented to person, place, and time. She appears well-developed and well-nourished.  HENT:  Nose: Nose normal.  Mouth/Throat: Oropharynx is clear and moist.  Eyes: EOM are normal.  Neck: Trachea normal, normal range of motion and full passive range of motion without pain. Neck supple. No JVD present. Carotid bruit is not present. No thyromegaly present.  Cardiovascular: Normal rate, regular rhythm, normal heart sounds and intact distal pulses.  Exam reveals no gallop and no friction rub.   No murmur heard. Pulmonary/Chest: Effort normal and breath sounds normal.  Abdominal: Soft. Bowel sounds are normal. She exhibits no distension and no mass. There is no tenderness.  Musculoskeletal:       Lumbar back: She exhibits decreased range of motion and pain.  Lymphadenopathy:    She has no cervical adenopathy.  Neurological: She is alert and oriented to person, place, and time. She has normal reflexes.  Skin: Skin is warm and dry.  Psychiatric: She has a normal mood and affect. Her behavior is normal. Judgment and thought content normal.   BP 136/77 mmHg  Pulse 75  Temp(Src) 97.5 F (36.4 C) (Oral)  Ht 5' 5" (1.651 m)  Wt 187 lb (84.823 kg)  BMI 31.12 kg/m2    Assessment & Plan:  1. Benign hypertension Do not add salt to diet - lisinopril-hydrochlorothiazide (PRINZIDE,ZESTORETIC) 10-12.5 MG per tablet; Take 1 tablet by mouth daily.  Dispense: 90 tablet; Refill: 0 - CMP14+EGFR -   EKG 12-Lead  2. COPD (chronic obstructive pulmonary disease) with chronic bronchitis Stop smoking  3. Gastroesophageal reflux disease without esophagitis Avoid spicy foods Do not eat 2 hours prior to bedtime - pantoprazole (PROTONIX) 40 MG tablet; Take 1  tablet (40 mg total) by mouth daily.  Dispense: 30 tablet; Refill: 5  4. Smokes tobacco daily Stop smoking - DG Chest 2 View; Future  5. Anxiety Stress management - ALPRAZolam (XANAX) 1 MG tablet; Take 1 tablet (1 mg total) by mouth 3 (three) times daily as needed for anxiety.  Dispense: 90 tablet; Refill: 1  6. Hypokalemia  7. Depression - PARoxetine (PAXIL) 20 MG tablet; Take 1 tablet (20 mg total) by mouth daily.  Dispense: 30 tablet; Refill: 5  8. Chronic lower back pain - HYDROcodone-acetaminophen (NORCO) 10-325 MG per tablet; Take 1 tablet by mouth every 8 (eight) hours as needed. For Pain  Dispense: 90 tablet; Refill: 0  9. Hyperlipidemia with target LDL less than 100 Low fat diet - simvastatin (ZOCOR) 40 MG tablet; Take 1 tablet (40 mg total) by mouth at bedtime.  Dispense: 30 tablet; Refill: 5 - Lipid panel  10. BMI 32.0-32.9,adult Discussed diet and exercise for person with BMI >25 Will recheck weight in 3-6 months    Labs pending Health maintenance reviewed Diet and exercise encouraged Continue all meds Follow up  In 3 months   [place], FNP

## 2015-09-23 LAB — CMP14+EGFR
A/G RATIO: 1.8 (ref 1.1–2.5)
ALT: 8 IU/L (ref 0–32)
AST: 14 IU/L (ref 0–40)
Albumin: 4.2 g/dL (ref 3.6–4.8)
Alkaline Phosphatase: 62 IU/L (ref 39–117)
BILIRUBIN TOTAL: 0.3 mg/dL (ref 0.0–1.2)
BUN/Creatinine Ratio: 8 — ABNORMAL LOW (ref 11–26)
BUN: 5 mg/dL — ABNORMAL LOW (ref 8–27)
CALCIUM: 10 mg/dL (ref 8.7–10.3)
CO2: 29 mmol/L (ref 18–29)
CREATININE: 0.59 mg/dL (ref 0.57–1.00)
Chloride: 99 mmol/L (ref 96–106)
GFR, EST AFRICAN AMERICAN: 109 mL/min/{1.73_m2} (ref >59)
GFR, EST NON AFRICAN AMERICAN: 94 mL/min/{1.73_m2} (ref >59)
GLOBULIN, TOTAL: 2.3 g/dL (ref 1.5–4.5)
Glucose: 91 mg/dL (ref 65–99)
POTASSIUM: 4.6 mmol/L (ref 3.5–5.2)
SODIUM: 142 mmol/L (ref 134–144)
TOTAL PROTEIN: 6.5 g/dL (ref 6.0–8.5)

## 2015-09-23 LAB — LIPID PANEL
CHOL/HDL RATIO: 2.2 ratio (ref 0.0–4.4)
Cholesterol, Total: 156 mg/dL (ref 100–199)
HDL: 70 mg/dL (ref >39)
LDL CALC: 58 mg/dL (ref 0–99)
TRIGLYCERIDES: 141 mg/dL (ref 0–149)
VLDL Cholesterol Cal: 28 mg/dL (ref 5–40)

## 2015-11-10 ENCOUNTER — Other Ambulatory Visit: Payer: Self-pay | Admitting: Nurse Practitioner

## 2015-11-10 DIAGNOSIS — F419 Anxiety disorder, unspecified: Secondary | ICD-10-CM

## 2015-11-10 DIAGNOSIS — M545 Low back pain, unspecified: Secondary | ICD-10-CM

## 2015-11-10 DIAGNOSIS — G8929 Other chronic pain: Secondary | ICD-10-CM

## 2015-11-12 ENCOUNTER — Encounter: Payer: Self-pay | Admitting: Family

## 2015-11-12 ENCOUNTER — Ambulatory Visit (INDEPENDENT_AMBULATORY_CARE_PROVIDER_SITE_OTHER): Payer: Medicare Other | Admitting: Family

## 2015-11-12 ENCOUNTER — Telehealth: Payer: Self-pay | Admitting: *Deleted

## 2015-11-12 VITALS — BP 128/80 | HR 72 | Temp 98.0°F | Ht 65.0 in | Wt 188.0 lb

## 2015-11-12 DIAGNOSIS — J441 Chronic obstructive pulmonary disease with (acute) exacerbation: Secondary | ICD-10-CM

## 2015-11-12 DIAGNOSIS — M545 Low back pain: Secondary | ICD-10-CM

## 2015-11-12 DIAGNOSIS — G8929 Other chronic pain: Secondary | ICD-10-CM

## 2015-11-12 DIAGNOSIS — F419 Anxiety disorder, unspecified: Secondary | ICD-10-CM

## 2015-11-12 DIAGNOSIS — J209 Acute bronchitis, unspecified: Secondary | ICD-10-CM

## 2015-11-12 MED ORDER — ALBUTEROL SULFATE HFA 108 (90 BASE) MCG/ACT IN AERS: 2.0000 | INHALATION_SPRAY | Freq: Four times a day (QID) | RESPIRATORY_TRACT | Status: DC | PRN

## 2015-11-12 MED ORDER — FLUTICASONE PROPIONATE 50 MCG/ACT NA SUSP: 2.0000 | Freq: Every day | NASAL | Status: DC

## 2015-11-12 MED ORDER — PREDNISONE 10 MG (21) PO TBPK: 10.0000 mg | ORAL_TABLET | Freq: Every day | ORAL | Status: DC

## 2015-11-12 MED ORDER — ALPRAZOLAM 1 MG PO TABS
1.0000 mg | ORAL_TABLET | Freq: Three times a day (TID) | ORAL | Status: DC | PRN
Start: 2015-11-12 — End: 2015-12-25

## 2015-11-12 MED ORDER — HYDROCODONE-ACETAMINOPHEN 10-325 MG PO TABS
1.0000 | ORAL_TABLET | Freq: Three times a day (TID) | ORAL | Status: DC | PRN
Start: 2015-11-12 — End: 2015-12-14

## 2015-11-12 MED ORDER — LEVOFLOXACIN 500 MG PO TABS
500.0000 mg | ORAL_TABLET | Freq: Every day | ORAL | Status: DC
Start: 2015-11-12 — End: 2015-12-25

## 2015-11-12 NOTE — Telephone Encounter (Signed)
rx given to patient

## 2015-11-12 NOTE — Progress Notes (Signed)
Subjective:    Patient ID: [name], female    DOB: [birth year], 70 y.o.   MRN: [id number]  Sinus Problem Associated symptoms include congestion, coughing, headaches, a hoarse voice, shortness of breath and swollen glands. Pertinent negatives include no ear pain.  Sore Throat  This is a new problem. The current episode started yesterday. There has been no fever. The pain is at a severity of 8/10. The pain is moderate. Associated symptoms include congestion, coughing, headaches, a hoarse voice, shortness of breath, swollen glands and trouble swallowing. Pertinent negatives include no ear discharge or ear pain. She has had no exposure to strep or mono. She has tried acetaminophen and oral narcotic analgesics for the symptoms. The treatment provided moderate relief.      Review of Systems  Constitutional: Negative.   HENT: Positive for congestion, hoarse voice and trouble swallowing. Negative for ear discharge and ear pain.   Eyes: Negative.   Respiratory: Positive for cough and shortness of breath.   Cardiovascular: Negative.  Negative for palpitations.  Gastrointestinal: Negative.   Endocrine: Negative.   Genitourinary: Negative.   Musculoskeletal: Negative.   Neurological: Positive for headaches.  Hematological: Negative.   Psychiatric/Behavioral: Negative.   All other systems reviewed and are negative.      Objective:   Physical Exam  Constitutional: She is oriented to person, place, and time. She appears well-developed and well-nourished. No distress.  HENT:  Head: Normocephalic and atraumatic.  Right Ear: External ear normal.  Left Ear: A middle ear effusion is present.  Nasal passage erythemas with mild swelling  Oropharynx erythemas   Eyes: Pupils are equal, round, and reactive to light.  Neck: Normal range of motion. Neck supple. No thyromegaly present.  Cardiovascular: Normal rate, regular rhythm, normal heart sounds and intact distal pulses.   No murmur  heard. Pulmonary/Chest: Effort normal. No respiratory distress. She has wheezes.  Rhonchi   Abdominal: Soft. Bowel sounds are normal. She exhibits no distension. There is no tenderness.  Musculoskeletal: Normal range of motion. She exhibits no edema or tenderness.  Neurological: She is alert and oriented to person, place, and time. She has normal reflexes. No cranial nerve deficit.  Skin: Skin is warm and dry.  Psychiatric: She has a normal mood and affect. Her behavior is normal. Judgment and thought content normal.  Vitals reviewed.     BP 128/80 mmHg  Pulse 72  Temp(Src) 98 F (36.7 C) (Oral)  Ht 5\' 5"  (1.651 m)  Wt 188 lb (85.276 kg)  BMI 31.28 kg/m2     Assessment & Plan:  1. Acute bronchitis, unspecified organism -- Take meds as prescribed - Use a cool mist humidifier  -Use saline nose sprays frequently -Saline irrigations of the nose can be very helpful if done frequently.  * 4X daily for 1 week*  * Use of a nettie pot can be helpful with this. Follow directions with this* -Force fluids -For any cough or congestion  Use plain Mucinex- regular strength or max strength is fine   * Children- consult with Pharmacist for dosing -For fever or aces or pains- take tylenol or ibuprofen appropriate for age and weight.  * for fevers greater than 101 orally you may alternate ibuprofen and tylenol every  3 hours. -Throat lozenges if help - predniSONE (STERAPRED UNI-PAK 21 TAB) 10 MG (21) TBPK tablet; Take 1 tablet (10 mg total) by mouth daily. As directed x 6 days  Dispense: 21 tablet; Refill: 0 - levofloxacin (LEVAQUIN) 500  MG tablet; Take 1 tablet (500 mg total) by mouth daily.  Dispense: 7 tablet; Refill: 0 - fluticasone (FLONASE) 50 MCG/ACT nasal spray; Place 2 sprays into both nostrils daily.  Dispense: 16 g; Refill: 6 - albuterol (PROVENTIL HFA;VENTOLIN HFA) 108 (90 Base) MCG/ACT inhaler; Inhale 2 puffs into the lungs every 6 (six) hours as needed for wheezing or shortness of  breath.  Dispense: 1 Inhaler; Refill: 2  2. COPD exacerbation (HCC) -Smoking cessation discussed -PT does not want any maintenance  inhalers at this time- States she does not want to get "hooked"- Pt educated but still refuses  - albuterol (PROVENTIL HFA;VENTOLIN HFA) 108 (90 Base) MCG/ACT inhaler; Inhale 2 puffs into the lungs every 6 (six) hours as needed for wheezing or shortness of breath.  Dispense: 1 Inhaler; Refill: [place], FNP

## 2015-11-12 NOTE — Telephone Encounter (Signed)
Last filled 10/15/15, last seen 09/22/15. Both will print

## 2015-11-12 NOTE — Patient Instructions (Signed)
Acute Bronchitis Bronchitis is inflammation of the airways that extend from the windpipe into the lungs (bronchi). The inflammation often causes mucus to develop. This leads to a cough, which is the most common symptom of bronchitis.  In acute bronchitis, the condition usually develops suddenly and goes away over time, usually in a couple weeks. Smoking, allergies, and asthma can make bronchitis worse. Repeated episodes of bronchitis may cause further lung problems.  CAUSES Acute bronchitis is most often caused by the same virus that causes a cold. The virus can spread from person to person (contagious) through coughing, sneezing, and touching contaminated objects. SIGNS AND SYMPTOMS   Cough.   Fever.   Coughing up mucus.   Body aches.   Chest congestion.   Chills.   Shortness of breath.   Sore throat.  DIAGNOSIS  Acute bronchitis is usually diagnosed through a physical exam. Your health care provider will also ask you questions about your medical history. Tests, such as chest X-rays, are sometimes done to rule out other conditions.  TREATMENT  Acute bronchitis usually goes away in a couple weeks. Oftentimes, no medical treatment is necessary. Medicines are sometimes given for relief of fever or cough. Antibiotic medicines are usually not needed but may be prescribed in certain situations. In some cases, an inhaler may be recommended to help reduce shortness of breath and control the cough. A cool mist vaporizer may also be used to help thin bronchial secretions and make it easier to clear the chest.  HOME CARE INSTRUCTIONS  Get plenty of rest.   Drink enough fluids to keep your urine clear or pale yellow (unless you have a medical condition that requires fluid restriction). Increasing fluids may help thin your respiratory secretions (sputum) and reduce chest congestion, and it will prevent dehydration.   Take medicines only as directed by your health care provider.  If  you were prescribed an antibiotic medicine, finish it all even if you start to feel better.  Avoid smoking and secondhand smoke. Exposure to cigarette smoke or irritating chemicals will make bronchitis worse. If you are a smoker, consider using nicotine gum or skin patches to help control withdrawal symptoms. Quitting smoking will help your lungs heal faster.   Reduce the chances of another bout of acute bronchitis by washing your hands frequently, avoiding people with cold symptoms, and trying not to touch your hands to your mouth, nose, or eyes.   Keep all follow-up visits as directed by your health care provider.  SEEK MEDICAL CARE IF: Your symptoms do not improve after 1 week of treatment.  SEEK IMMEDIATE MEDICAL CARE IF:  You develop an increased fever or chills.   You have chest pain.   You have severe shortness of breath.  You have bloody sputum.   You develop dehydration.  You faint or repeatedly feel like you are going to pass out.  You develop repeated vomiting.  You develop a severe headache. MAKE SURE YOU:   Understand these instructions.  Will watch your condition.  Will get help right away if you are not doing well or get worse.   This information is not intended to replace advice given to you by your health care provider. Make sure you discuss any questions you have with your health care provider.   Document Released: 10/27/2004 Document Revised: 10/10/2014 Document Reviewed: 03/12/2013 Elsevier Interactive Patient Education 2016 Else[employer]  - Take meds as prescribed - Use a cool mist humidifier  -Use saline nose sprays frequently -Saline   irrigations of the nose can be very helpful if done frequently.  * 4X daily for 1 week*  * Use of a nettie pot can be helpful with this. Follow directions with this* -Force fluids -For any cough or congestion  Use plain Mucinex- regular strength or max strength is fine   * Children- consult with Pharmacist for  dosing -For fever or aces or pains- take tylenol or ibuprofen appropriate for age and weight.  * for fevers greater than 101 orally you may alternate ibuprofen and tylenol every  3 hours. -Throat lozenges if help    Castella Lerner, FNP  

## 2015-12-09 ENCOUNTER — Other Ambulatory Visit: Payer: Self-pay | Admitting: Nurse Practitioner

## 2015-12-09 DIAGNOSIS — G8929 Other chronic pain: Secondary | ICD-10-CM

## 2015-12-09 DIAGNOSIS — M545 Low back pain: Principal | ICD-10-CM

## 2015-12-14 MED ORDER — HYDROCODONE-ACETAMINOPHEN 10-325 MG PO TABS: 1.0000 | ORAL_TABLET | Freq: Three times a day (TID) | ORAL | Status: DC | PRN

## 2015-12-14 NOTE — Telephone Encounter (Signed)
Pain rx ready for pick up  

## 2015-12-14 NOTE — Telephone Encounter (Signed)
Patient informed that prescription ready for pick up

## 2015-12-25 ENCOUNTER — Telehealth: Payer: Self-pay | Admitting: *Deleted

## 2015-12-25 ENCOUNTER — Ambulatory Visit (INDEPENDENT_AMBULATORY_CARE_PROVIDER_SITE_OTHER): Payer: Medicare Other | Admitting: Nurse Practitioner

## 2015-12-25 ENCOUNTER — Encounter: Payer: Self-pay | Admitting: Nurse Practitioner

## 2015-12-25 VITALS — BP 119/71 | HR 76 | Temp 98.2°F | Ht 65.0 in | Wt 190.6 lb

## 2015-12-25 DIAGNOSIS — I10 Essential (primary) hypertension: Secondary | ICD-10-CM | POA: Diagnosis not present

## 2015-12-25 DIAGNOSIS — F329 Major depressive disorder, single episode, unspecified: Secondary | ICD-10-CM

## 2015-12-25 DIAGNOSIS — M545 Low back pain: Secondary | ICD-10-CM | POA: Diagnosis not present

## 2015-12-25 DIAGNOSIS — Z1212 Encounter for screening for malignant neoplasm of rectum: Secondary | ICD-10-CM | POA: Diagnosis not present

## 2015-12-25 DIAGNOSIS — E785 Hyperlipidemia, unspecified: Secondary | ICD-10-CM | POA: Diagnosis not present

## 2015-12-25 DIAGNOSIS — K219 Gastro-esophageal reflux disease without esophagitis: Secondary | ICD-10-CM

## 2015-12-25 DIAGNOSIS — G8929 Other chronic pain: Secondary | ICD-10-CM | POA: Diagnosis not present

## 2015-12-25 DIAGNOSIS — Z1159 Encounter for screening for other viral diseases: Secondary | ICD-10-CM | POA: Diagnosis not present

## 2015-12-25 DIAGNOSIS — F32A Depression, unspecified: Secondary | ICD-10-CM

## 2015-12-25 DIAGNOSIS — F419 Anxiety disorder, unspecified: Secondary | ICD-10-CM | POA: Diagnosis not present

## 2015-12-25 MED ORDER — ALPRAZOLAM 1 MG PO TABS: 1.0000 mg | ORAL_TABLET | Freq: Three times a day (TID) | ORAL | Status: DC | PRN

## 2015-12-25 MED ORDER — PAROXETINE HCL 20 MG PO TABS: 20.0000 mg | ORAL_TABLET | Freq: Every day | ORAL | Status: DC

## 2015-12-25 MED ORDER — LISINOPRIL-HYDROCHLOROTHIAZIDE 10-12.5 MG PO TABS: 1.0000 | ORAL_TABLET | Freq: Every day | ORAL | Status: DC

## 2015-12-25 MED ORDER — SIMVASTATIN 40 MG PO TABS: 40.0000 mg | ORAL_TABLET | Freq: Every day | ORAL | Status: DC

## 2015-12-25 MED ORDER — HYDROCODONE-ACETAMINOPHEN 10-325 MG PO TABS: 1.0000 | ORAL_TABLET | Freq: Three times a day (TID) | ORAL | Status: DC | PRN

## 2015-12-25 MED ORDER — PANTOPRAZOLE SODIUM 40 MG PO TBEC: 40.0000 mg | DELAYED_RELEASE_TABLET | Freq: Every day | ORAL | Status: DC

## 2015-12-25 NOTE — Patient Instructions (Signed)
Fall Prevention in the Home  Falls can cause injuries and can affect people from all age groups. There are many simple things that you can do to make your home safe and to help prevent falls. WHAT CAN I DO ON THE OUTSIDE OF MY HOME?  Regularly repair the edges of walkways and driveways and fix any cracks.  Remove high doorway thresholds.  Trim any shrubbery on the main path into your home.  Use bright outdoor lighting.  Clear walkways of debris and clutter, including tools and rocks.  Regularly check that handrails are securely fastened and in good repair. Both sides of any steps should have handrails.  Install guardrails along the edges of any raised decks or porches.  Have leaves, snow, and ice cleared regularly.  Use sand or salt on walkways during winter months.  In the garage, clean up any spills right away, including grease or oil spills. WHAT CAN I DO IN THE BATHROOM?  Use night lights.  Install grab bars by the toilet and in the tub and shower. Do not use towel bars as grab bars.  Use non-skid mats or decals on the floor of the tub or shower.  If you need to sit down while you are in the shower, use a plastic, non-slip stool..  Keep the floor dry. Immediately clean up any water that spills on the floor.  Remove soap buildup in the tub or shower on a regular basis.  Attach bath mats securely with double-sided non-slip rug tape.  Remove throw rugs and other tripping hazards from the floor. WHAT CAN I DO IN THE BEDROOM?  Use night lights.  Make sure that a bedside light is easy to reach.  Do not use oversized bedding that drapes onto the floor.  Have a firm chair that has side arms to use for getting dressed.  Remove throw rugs and other tripping hazards from the floor. WHAT CAN I DO IN THE KITCHEN?   Clean up any spills right away.  Avoid walking on wet floors.  Place frequently used items in easy-to-reach places.  If you need to reach for something  above you, use a sturdy step stool that has a grab bar.  Keep electrical cables out of the way.  Do not use floor polish or wax that makes floors slippery. If you have to use wax, make sure that it is non-skid floor wax.  Remove throw rugs and other tripping hazards from the floor. WHAT CAN I DO IN THE STAIRWAYS?  Do not leave any items on the stairs.  Make sure that there are handrails on both sides of the stairs. Fix handrails that are broken or loose. Make sure that handrails are as long as the stairways.  Check any carpeting to make sure that it is firmly attached to the stairs. Fix any carpet that is loose or worn.  Avoid having throw rugs at the top or bottom of stairways, or secure the rugs with carpet tape to prevent them from moving.  Make sure that you have a light switch at the top of the stairs and the bottom of the stairs. If you do not have them, have them installed. WHAT ARE SOME OTHER FALL PREVENTION TIPS?  Wear closed-toe shoes that fit well and support your feet. Wear shoes that have rubber soles or low heels.  When you use a stepladder, make sure that it is completely opened and that the sides are firmly locked. Have someone hold the ladder while you   are using it. Do not climb a closed stepladder.  Add color or contrast paint or tape to grab bars and handrails in your home. Place contrasting color strips on the first and last steps.  Use mobility aids as needed, such as canes, walkers, scooters, and crutches.  Turn on lights if it is dark. Replace any light bulbs that burn out.  Set up furniture so that there are clear paths. Keep the furniture in the same spot.  Fix any uneven floor surfaces.  Choose a carpet design that does not hide the edge of steps of a stairway.  Be aware of any and all pets.  Review your medicines with your healthcare provider. Some medicines can cause dizziness or changes in blood pressure, which increase your risk of falling. Talk  with your health care provider about other ways that you can decrease your risk of falls. This may include working with a physical therapist or trainer to improve your strength, balance, and endurance.   This information is not intended to replace advice given to you by your health care provider. Make sure you discuss any questions you have with your health care provider.   Document Released: 09/09/2002 Document Revised: 02/03/2015 Document Reviewed: 10/24/2014 Elsevier Interactive Patient Education 2016 Elsevier Inc.  

## 2015-12-25 NOTE — Telephone Encounter (Signed)
Patient meant to ask you for a antibiotic for cough and congestion when seen earlier.

## 2015-12-25 NOTE — Progress Notes (Signed)
Subjective:    Patient ID: [name], female    DOB: October 24, 1945, 70 y.o.   MRN: [id number]  Patient here today for follow up of chronic medical problems.  Outpatient Encounter Prescriptions as of 12/25/2015  Medication Sig  . albuterol (PROVENTIL HFA;VENTOLIN HFA) 108 (90 Base) MCG/ACT inhaler Inhale 2 puffs into the lungs every 6 (six) hours as needed for wheezing or shortness of breath.  . ALPRAZolam (XANAX) 1 MG tablet Take 1 tablet (1 mg total) by mouth 3 (three) times daily as needed for anxiety.  . calcium carbonate (OS-CAL) 600 MG TABS Take 600 mg by mouth daily.   . chlorzoxazone (PARAFON) 500 MG tablet TAKE ONE TABLET BY MOUTH THREE TIMES DAILY AS NEEDED  . Cholecalciferol (VITAMIN D3) 1000 UNITS CAPS Take 3,000 mg by mouth daily.   Marland Kitchen dicyclomine (BENTYL) 10 MG capsule TAKE 1 CAPSULE BY MOUTH FOUR TIMES A DAY AS NEEDED FOR IRRITABLE BOWEL SYNDROME  . diphenhydrAMINE (BENADRYL) 25 MG tablet Take 25 mg by mouth 2 (two) times daily as needed. For Allergies  . fluticasone (FLONASE) 50 MCG/ACT nasal spray Place 2 sprays into both nostrils daily.  Marland Kitchen HYDROcodone-acetaminophen (NORCO) 10-325 MG tablet Take 1 tablet by mouth every 8 (eight) hours as needed. For Pain  . lisinopril-hydrochlorothiazide (PRINZIDE,ZESTORETIC) 10-12.5 MG tablet Take 1 tablet by mouth daily.  . Multiple Vitamin (MULITIVITAMIN WITH MINERALS) TABS Take 1 tablet by mouth daily.  . pantoprazole (PROTONIX) 40 MG tablet Take 1 tablet (40 mg total) by mouth daily.  Marland Kitchen PARoxetine (PAXIL) 20 MG tablet Take 1 tablet (20 mg total) by mouth daily.  . simvastatin (ZOCOR) 40 MG tablet Take 1 tablet (40 mg total) by mouth at bedtime.  . vitamin C (ASCORBIC ACID) 500 MG tablet Take 1,000 mg by mouth daily.   . [DISCONTINUED] levofloxacin (LEVAQUIN) 500 MG tablet Take 1 tablet (500 mg total) by mouth daily.  . [DISCONTINUED] predniSONE (STERAPRED UNI-PAK 21 TAB) 10 MG (21) TBPK tablet Take 1 tablet (10 mg total) by mouth  daily. As directed x 6 days   No facility-administered encounter medications on file as of 12/25/2015.      Hypertension This is a chronic problem. The current episode started more than 1 year ago. The problem is unchanged. The problem is controlled. Pertinent negatives include no chest pain, headaches, palpitations or shortness of breath. Risk factors for coronary artery disease include dyslipidemia, family history, post-menopausal state and sedentary lifestyle. Past treatments include ACE inhibitors and diuretics. The current treatment provides moderate improvement. Compliance problems include diet and exercise.   Cough Pertinent negatives include no chest pain, headaches or shortness of breath.  Hyperlipidemia This is a chronic problem. The current episode started more than 1 year ago. Recent lipid tests were reviewed and are variable. Pertinent negatives include no chest pain or shortness of breath. Current antihyperlipidemic treatment includes statins. The current treatment provides moderate improvement of lipids. Compliance problems include adherence to diet and adherence to exercise.  Risk factors for coronary artery disease include dyslipidemia, hypertension and post-menopausal.  GAD/depression Currently takes Paxil '20mg'$  qd and xanax .'5mg'$  1 tid.  Has stress at home taking care of husband who is recovering from lung cancer and now haleukemia. COPD/smoking Currently smokes 1 pack per day. Just not interested in stopping right now.  Is not on an inhaler due to cost. GERD Currently takes Protonix q day with good results.   Chronic back pain Currently taking Hydrocodone 10/'325mg'$  1 po tid for relief  of lumbar pain and right leg pain.   hypokalemia K supplements daily- no problems   Review of Systems  Constitutional: Negative.   Respiratory: Positive for cough. Negative for shortness of breath.   Cardiovascular: Negative.  Negative for chest pain and palpitations.  Gastrointestinal:  Negative.   Musculoskeletal: Positive for back pain.          Skin: Negative.   Neurological: Negative.  Negative for headaches.  Psychiatric/Behavioral: Negative.        Objective:   Physical Exam  Constitutional: She is oriented to person, place, and time. She appears well-developed and well-nourished.  HENT:  Nose: Nose normal.  Mouth/Throat: Oropharynx is clear and moist.  Eyes: EOM are normal.  Neck: Trachea normal, normal range of motion and full passive range of motion without pain. Neck supple. No JVD present. Carotid bruit is not present. No thyromegaly present.  Cardiovascular: Normal rate, regular rhythm, normal heart sounds and intact distal pulses.  Exam reveals no gallop and no friction rub.   No murmur heard. Pulmonary/Chest: Effort normal and breath sounds normal.  Abdominal: Soft. Bowel sounds are normal. She exhibits no distension and no mass. There is no tenderness.  Musculoskeletal:       Lumbar back: She exhibits decreased range of motion and pain.  Lymphadenopathy:    She has no cervical adenopathy.  Neurological: She is alert and oriented to person, place, and time. She has normal reflexes.  Skin: Skin is warm and dry.  Psychiatric: She has a normal mood and affect. Her behavior is normal. Judgment and thought content normal.   BP 119/71 mmHg  Pulse 76  Temp(Src) 98.2 F (36.8 C) (Oral)  Ht '5\' 5"'$  (1.651 m)  Wt 190 lb 9.6 oz (86.456 kg)  BMI 31.72 kg/m2    Assessment & Plan:  1. Anxiety Stress management - ALPRAZolam (XANAX) 1 MG tablet; Take 1 tablet (1 mg total) by mouth 3 (three) times daily as needed for anxiety.  Dispense: 90 tablet; Refill: 1  2. Benign hypertension Do not add slatto diet - lisinopril-hydrochlorothiazide (PRINZIDE,ZESTORETIC) 10-12.5 MG tablet; Take 1 tablet by mouth daily.  Dispense: 90 tablet; Refill: 1  3. Chronic lower back pain Back stretches - HYDROcodone-acetaminophen (NORCO) 10-325 MG tablet; Take 1 tablet by  mouth every 8 (eight) hours as needed. For Pain  Dispense: 90 tablet; Refill: 0  4. Depression Stress management - PARoxetine (PAXIL) 20 MG tablet; Take 1 tablet (20 mg total) by mouth daily.  Dispense: 30 tablet; Refill: 5  5. Gastroesophageal reflux disease without esophagitis Avoid spicy foods Do not eat 2 hours prior to bedtime - pantoprazole (PROTONIX) 40 MG tablet; Take 1 tablet (40 mg total) by mouth daily.  Dispense: 30 tablet; Refill: 5  6. Hyperlipidemia with target LDL less than 100 Low fat diet - CMP14+EGFR - Lipid panel - simvastatin (ZOCOR) 40 MG tablet; Take 1 tablet (40 mg total) by mouth at bedtime.  Dispense: 30 tablet; Refill: 5  7. Screening for malignant neoplasm of the rectum - Fecal occult blood, imunochemical; Future  8. Need for hepatitis C screening test - Hepatitis C antibody    Labs pending Health maintenance reviewed Diet and exercise encouraged Continue all meds Follow up  In 3 month   [place], FNP

## 2015-12-26 NOTE — Telephone Encounter (Signed)
Did not have fever- just viral callis gets worse

## 2015-12-29 LAB — LIPID PANEL
CHOL/HDL RATIO: 2.1 ratio (ref 0.0–4.4)
CHOLESTEROL TOTAL: 145 mg/dL (ref 100–199)
HDL: 69 mg/dL (ref >39)
LDL CALC: 50 mg/dL (ref 0–99)
TRIGLYCERIDES: 129 mg/dL (ref 0–149)
VLDL CHOLESTEROL CAL: 26 mg/dL (ref 5–40)

## 2015-12-29 LAB — CMP14+EGFR
ALBUMIN: 4.1 g/dL (ref 3.6–4.8)
ALK PHOS: 70 IU/L (ref 39–117)
ALT: 12 IU/L (ref 0–32)
AST: 11 IU/L (ref 0–40)
Albumin/Globulin Ratio: 2 (ref 1.2–2.2)
BUN / CREAT RATIO: 9 — AB (ref 11–26)
BUN: 5 mg/dL — AB (ref 8–27)
Bilirubin Total: <0.2 mg/dL (ref 0.0–1.2)
CALCIUM: 9.4 mg/dL (ref 8.7–10.3)
CO2: 25 mmol/L (ref 18–29)
CREATININE: 0.54 mg/dL — AB (ref 0.57–1.00)
Chloride: 95 mmol/L — ABNORMAL LOW (ref 96–106)
GFR calc Af Amer: 112 mL/min/{1.73_m2} (ref >59)
GFR, EST NON AFRICAN AMERICAN: 97 mL/min/{1.73_m2} (ref >59)
GLOBULIN, TOTAL: 2.1 g/dL (ref 1.5–4.5)
GLUCOSE: 86 mg/dL (ref 65–99)
Potassium: 4.2 mmol/L (ref 3.5–5.2)
SODIUM: 137 mmol/L (ref 134–144)
Total Protein: 6.2 g/dL (ref 6.0–8.5)

## 2015-12-29 LAB — HCV AB W REFLEX TO QUANT PCR: HCV Ab: 0.1 s/co ratio (ref 0.0–0.9)

## 2016-01-01 ENCOUNTER — Other Ambulatory Visit: Payer: Self-pay | Admitting: Nurse Practitioner

## 2016-01-06 NOTE — Telephone Encounter (Signed)
Patient has not called back in response to message left for her.

## 2016-01-27 ENCOUNTER — Other Ambulatory Visit: Payer: Medicare Other

## 2016-01-27 DIAGNOSIS — Z1212 Encounter for screening for malignant neoplasm of rectum: Secondary | ICD-10-CM

## 2016-01-28 LAB — FECAL OCCULT BLOOD, IMMUNOCHEMICAL: Fecal Occult Bld: NEGATIVE

## 2016-02-09 ENCOUNTER — Other Ambulatory Visit: Payer: Self-pay | Admitting: Nurse Practitioner

## 2016-02-09 DIAGNOSIS — G8929 Other chronic pain: Secondary | ICD-10-CM

## 2016-02-09 DIAGNOSIS — M545 Low back pain: Principal | ICD-10-CM

## 2016-02-09 MED ORDER — HYDROCODONE-ACETAMINOPHEN 10-325 MG PO TABS: 1.0000 | ORAL_TABLET | Freq: Three times a day (TID) | ORAL | Status: DC | PRN

## 2016-02-09 NOTE — Telephone Encounter (Signed)
Pt aware. rx placed up front

## 2016-02-14 ENCOUNTER — Other Ambulatory Visit: Payer: Self-pay | Admitting: Nurse Practitioner

## 2016-03-09 ENCOUNTER — Telehealth: Payer: Self-pay | Admitting: Nurse Practitioner

## 2016-03-09 DIAGNOSIS — M545 Low back pain, unspecified: Secondary | ICD-10-CM

## 2016-03-09 DIAGNOSIS — G8929 Other chronic pain: Secondary | ICD-10-CM

## 2016-03-09 DIAGNOSIS — F419 Anxiety disorder, unspecified: Secondary | ICD-10-CM

## 2016-03-10 ENCOUNTER — Other Ambulatory Visit: Payer: Self-pay

## 2016-03-10 DIAGNOSIS — F32A Depression, unspecified: Secondary | ICD-10-CM

## 2016-03-10 DIAGNOSIS — F329 Major depressive disorder, single episode, unspecified: Secondary | ICD-10-CM

## 2016-03-10 MED ORDER — ALPRAZOLAM 1 MG PO TABS: 1.0000 mg | ORAL_TABLET | Freq: Three times a day (TID) | ORAL | Status: DC | PRN

## 2016-03-10 MED ORDER — HYDROCODONE-ACETAMINOPHEN 10-325 MG PO TABS: 1.0000 | ORAL_TABLET | Freq: Three times a day (TID) | ORAL | Status: DC | PRN

## 2016-03-10 MED ORDER — PAROXETINE HCL 20 MG PO TABS: 20.0000 mg | ORAL_TABLET | Freq: Every day | ORAL | Status: DC

## 2016-03-10 NOTE — Telephone Encounter (Signed)
rx ready for pick up Needs to be seen for pain management- initial appointment

## 2016-03-10 NOTE — Telephone Encounter (Signed)
Pt aware and scheduled appt for pain contract 6/30 at 11:30.

## 2016-03-18 ENCOUNTER — Other Ambulatory Visit: Payer: Self-pay

## 2016-03-18 MED ORDER — SIMVASTATIN 40 MG PO TABS: ORAL_TABLET | ORAL | Status: DC

## 2016-03-22 ENCOUNTER — Other Ambulatory Visit: Payer: Self-pay | Admitting: *Deleted

## 2016-03-22 DIAGNOSIS — K219 Gastro-esophageal reflux disease without esophagitis: Secondary | ICD-10-CM

## 2016-03-22 DIAGNOSIS — F329 Major depressive disorder, single episode, unspecified: Secondary | ICD-10-CM

## 2016-03-22 DIAGNOSIS — F32A Depression, unspecified: Secondary | ICD-10-CM

## 2016-03-22 MED ORDER — PANTOPRAZOLE SODIUM 40 MG PO TBEC
40.0000 mg | DELAYED_RELEASE_TABLET | Freq: Every day | ORAL | Status: DC
Start: 2016-03-22 — End: 2016-04-08

## 2016-04-01 ENCOUNTER — Ambulatory Visit (INDEPENDENT_AMBULATORY_CARE_PROVIDER_SITE_OTHER): Payer: Medicare Other | Admitting: Nurse Practitioner

## 2016-04-01 ENCOUNTER — Encounter: Payer: Self-pay | Admitting: Nurse Practitioner

## 2016-04-01 VITALS — BP 164/84 | HR 74 | Temp 98.8°F | Ht 65.0 in | Wt 185.4 lb

## 2016-04-01 DIAGNOSIS — Z7189 Other specified counseling: Secondary | ICD-10-CM

## 2016-04-01 DIAGNOSIS — M545 Low back pain, unspecified: Secondary | ICD-10-CM

## 2016-04-01 DIAGNOSIS — G8929 Other chronic pain: Secondary | ICD-10-CM | POA: Diagnosis not present

## 2016-04-01 MED ORDER — HYDROCODONE-ACETAMINOPHEN 10-325 MG PO TABS: 1.0000 | ORAL_TABLET | Freq: Four times a day (QID) | ORAL | Status: DC | PRN

## 2016-04-01 MED ORDER — HYDROCODONE-ACETAMINOPHEN 10-325 MG PO TABS: 1.0000 | ORAL_TABLET | Freq: Three times a day (TID) | ORAL | Status: DC | PRN

## 2016-04-01 NOTE — Progress Notes (Signed)
Subjective:    Patient ID: [name], female    DOB: 1946-09-03, 70 y.o.   MRN: [id number]  HPI   [facility] Controlled Substance Abuse database reviewed- Yes  Depression screen [facility] 2/9 04/01/2016 12/25/2015 11/12/2015 08/25/2015 02/23/2015  Decreased Interest 1 0 0 0 1  Down, Depressed, Hopeless 1 1 0 0 1  PHQ - 2 Score 2 1 0 0 2  Altered sleeping 1 - - - 2  Tired, decreased energy 0 - - - 3  Change in appetite 0 - - - 0  Feeling bad or failure about yourself  0 - - - 0  Trouble concentrating 0 - - - 1  Moving slowly or fidgety/restless 0 - - - 0  Suicidal thoughts 0 - - - 0  PHQ-9 Score 3 - - - 8  Difficult doing work/chores Somewhat difficult - - - -    GAD 7 : Generalized Anxiety Score 04/01/2016  Nervous, Anxious, on Edge 1  Control/stop worrying 0  Worry too much - different things 0  Trouble relaxing 0  Restless 0  Easily annoyed or irritable 1  Afraid - awful might happen 0  Total GAD 7 Score 2  Anxiety Difficulty Not difficult at all       [facility] drug screen performed- Yes  SOAPP  0= never  1= seldom  2=sometimes  3= often  4= very often  How often do you have mood swings? 0 How often do you smoke a cigarette within an hour after waling up? 2 How often have you taken medication other than the way that it was prescribed?0 How often have you used illegal drugs in the past 5 years? 0 How often, in your lifetime, have you had legal problems or been arrested? 0  Score 2  Alcohol Audit - How often during the last year have found that you: 0-Never   1- Less than monthly   2- Monthly     3-Weekly     4-daily or almost daily  - found that you were not able to stop drinking once you started- 0 -failed to do what was normally expected of you because of drinking- 0 -needed a first drink in the morning- 0 -had a feeling of guilt or remorse after drinking- 0 -are/were unable to remember what happened the night before because of your drinking- 0  0-  NO   2- yes but not in last year  4- yes during last year -Have you or someone else been injured because of your drinking- 0 - Has anyone been concerned about your drinking or suggested you cut down- 0        TOTAL- 0  ( 0-7- alcohol education, 8-15- simple advice, 16-19 simple advice plus counseling, 20-40 referral for evaluation and treatment 0   Designated Pharmacy- [facility]  Pain assessment: Cause of pain- multiple back surgeries Pain location- low back pain Pain on scale of 1-10- 2-10/10 Frequency- daily What increases pain-walking and standing What makes pain Better-resting and pain meds Effects on ADL - has to rest frequently  Prior treatments tried and failed- surgery Current treatments- norco 10/325 TID Morphine mg equivalent- 5  Pain management agreement reviewed and signed- Yes      Review of Systems  Constitutional: Negative.   HENT: Negative.   Respiratory: Negative.   Cardiovascular: Negative.   Genitourinary: Negative.   Neurological: Negative.   All other systems reviewed and are negative.      Objective:  Physical Exam  Constitutional: She is oriented to person, place, and time. She appears well-developed and well-nourished. No distress.  Cardiovascular: Normal rate and normal heart sounds.   Pulmonary/Chest: Effort normal and breath sounds normal.  Musculoskeletal:  FROM of lumbar spine with pain on rotation (-) SLR bil DTR's equal bil motor strength and sensation distally intact  Neurological: She is alert and oriented to person, place, and time.  Skin: Skin is warm.  Psychiatric: She has a normal mood and affect. Her behavior is normal. Judgment and thought content normal.   BP 164/84 mmHg  Pulse 74  Temp(Src) 98.8 F (37.1 C) (Oral)  Ht 5\' 5"  (1.651 m)  Wt 185 lb 6.4 oz (84.097 kg)  BMI 30.85 kg/m2        Assessment & Plan:   1. Chronic lower back pain   2. Encounter for chronic pain management    Meds ordered this  encounter  Medications  . HYDROcodone-acetaminophen (NORCO) 10-325 MG tablet    Sig: Take 1 tablet by mouth every 8 (eight) hours as needed. For Pain    Dispense:  90 tablet    Refill:  0    Order Specific Question:  Supervising Provider    Answer:  Chipper Herb [[id number]]  . HYDROcodone-acetaminophen (NORCO) 10-325 MG tablet    Sig: Take 1 tablet by mouth every 6 (six) hours as needed.    Dispense:  90 tablet    Refill:  0    DO NOT FILL TILL 04/30/16    Order Specific Question:  Supervising Provider    Answer:  Chipper Herb [[id number]]  . HYDROcodone-acetaminophen (NORCO) 10-325 MG tablet    Sig: Take 1 tablet by mouth every 6 (six) hours as needed.    Dispense:  30 tablet    Refill:  0    DO NOT FILL TILL 05/30/16    Order Specific Question:  Supervising Provider    Answer:  Chipper Herb [[id number]]   Follow up in 3 months [place], FNP

## 2016-04-08 ENCOUNTER — Ambulatory Visit (INDEPENDENT_AMBULATORY_CARE_PROVIDER_SITE_OTHER): Payer: Medicare Other | Admitting: Nurse Practitioner

## 2016-04-08 ENCOUNTER — Encounter: Payer: Self-pay | Admitting: Nurse Practitioner

## 2016-04-08 VITALS — BP 115/72 | HR 78 | Temp 97.0°F | Ht 65.0 in | Wt 184.0 lb

## 2016-04-08 DIAGNOSIS — K219 Gastro-esophageal reflux disease without esophagitis: Secondary | ICD-10-CM | POA: Diagnosis not present

## 2016-04-08 DIAGNOSIS — M545 Low back pain: Secondary | ICD-10-CM

## 2016-04-08 DIAGNOSIS — F419 Anxiety disorder, unspecified: Secondary | ICD-10-CM

## 2016-04-08 DIAGNOSIS — G8929 Other chronic pain: Secondary | ICD-10-CM | POA: Diagnosis not present

## 2016-04-08 DIAGNOSIS — F32A Depression, unspecified: Secondary | ICD-10-CM

## 2016-04-08 DIAGNOSIS — Z6832 Body mass index (BMI) 32.0-32.9, adult: Secondary | ICD-10-CM | POA: Diagnosis not present

## 2016-04-08 DIAGNOSIS — E785 Hyperlipidemia, unspecified: Secondary | ICD-10-CM

## 2016-04-08 DIAGNOSIS — I10 Essential (primary) hypertension: Secondary | ICD-10-CM | POA: Diagnosis not present

## 2016-04-08 DIAGNOSIS — E876 Hypokalemia: Secondary | ICD-10-CM

## 2016-04-08 DIAGNOSIS — F329 Major depressive disorder, single episode, unspecified: Secondary | ICD-10-CM

## 2016-04-08 DIAGNOSIS — J449 Chronic obstructive pulmonary disease, unspecified: Secondary | ICD-10-CM | POA: Diagnosis not present

## 2016-04-08 DIAGNOSIS — K529 Noninfective gastroenteritis and colitis, unspecified: Secondary | ICD-10-CM | POA: Diagnosis not present

## 2016-04-08 MED ORDER — SIMVASTATIN 40 MG PO TABS: ORAL_TABLET | ORAL | Status: DC

## 2016-04-08 MED ORDER — DICYCLOMINE HCL 10 MG PO CAPS: 10.0000 mg | ORAL_CAPSULE | Freq: Four times a day (QID) | ORAL | Status: DC | PRN

## 2016-04-08 MED ORDER — PANTOPRAZOLE SODIUM 40 MG PO TBEC: 40.0000 mg | DELAYED_RELEASE_TABLET | Freq: Every day | ORAL | Status: DC

## 2016-04-08 MED ORDER — PAROXETINE HCL 20 MG PO TABS: 20.0000 mg | ORAL_TABLET | Freq: Every day | ORAL | Status: DC

## 2016-04-08 MED ORDER — ALPRAZOLAM 1 MG PO TABS: 1.0000 mg | ORAL_TABLET | Freq: Three times a day (TID) | ORAL | Status: DC | PRN

## 2016-04-08 MED ORDER — LISINOPRIL-HYDROCHLOROTHIAZIDE 10-12.5 MG PO TABS: 1.0000 | ORAL_TABLET | Freq: Every day | ORAL | Status: DC

## 2016-04-08 NOTE — Progress Notes (Signed)
Subjective:    Patient ID: [name], female    DOB: 1946-07-04, 70 y.o.   MRN: [id number]  Patient here today for follow up of chronic medical problems.  Outpatient Encounter Prescriptions as of 04/08/2016  Medication Sig  . albuterol (PROVENTIL HFA;VENTOLIN HFA) 108 (90 Base) MCG/ACT inhaler Inhale 2 puffs into the lungs every 6 (six) hours as needed for wheezing or shortness of breath.  . ALPRAZolam (XANAX) 1 MG tablet Take 1 tablet (1 mg total) by mouth 3 (three) times daily as needed for anxiety.  . calcium carbonate (OS-CAL) 600 MG TABS Take 600 mg by mouth daily.   . chlorzoxazone (PARAFON) 500 MG tablet TAKE ONE TABLET BY MOUTH THREE TIMES DAILY AS NEEDED  . Cholecalciferol (VITAMIN D3) 1000 UNITS CAPS Take 3,000 mg by mouth daily.   Marland Kitchen dicyclomine (BENTYL) 10 MG capsule TAKE 1 CAPSULE BY MOUTH FOUR TIMES A DAY AS NEEDED FOR IRRITABLE BOWEL SYNDROME  . diphenhydrAMINE (BENADRYL) 25 MG tablet Take 25 mg by mouth 2 (two) times daily as needed. For Allergies  . fluticasone (FLONASE) 50 MCG/ACT nasal spray Place 2 sprays into both nostrils daily.  Marland Kitchen HYDROcodone-acetaminophen (NORCO) 10-325 MG tablet Take 1 tablet by mouth every 8 (eight) hours as needed. For Pain  . HYDROcodone-acetaminophen (NORCO) 10-325 MG tablet Take 1 tablet by mouth every 6 (six) hours as needed.  Marland Kitchen HYDROcodone-acetaminophen (NORCO) 10-325 MG tablet Take 1 tablet by mouth every 6 (six) hours as needed.  Marland Kitchen lisinopril-hydrochlorothiazide (PRINZIDE,ZESTORETIC) 10-12.5 MG tablet TAKE ONE TABLET BY MOUTH ONE TIME DAILY  . Multiple Vitamin (MULITIVITAMIN WITH MINERALS) TABS Take 1 tablet by mouth daily.  . pantoprazole (PROTONIX) 40 MG tablet Take 1 tablet (40 mg total) by mouth daily.  Marland Kitchen PARoxetine (PAXIL) 20 MG tablet Take 1 tablet (20 mg total) by mouth daily.  . simvastatin (ZOCOR) 40 MG tablet TAKE 1 TABLET BY MOUTH AT BEEDTIME  . vitamin C (ASCORBIC ACID) 500 MG tablet Take 1,000 mg by mouth daily.    No  facility-administered encounter medications on file as of 04/08/2016.     Hypertension This is a chronic problem. The current episode started more than 1 year ago. The problem is unchanged. The problem is controlled. Pertinent negatives include no palpitations. Risk factors for coronary artery disease include dyslipidemia, family history, post-menopausal state and sedentary lifestyle. Past treatments include ACE inhibitors and diuretics. The current treatment provides moderate improvement. Compliance problems include diet and exercise.   Hyperlipidemia This is a chronic problem. The current episode started more than 1 year ago. Recent lipid tests were reviewed and are variable. Current antihyperlipidemic treatment includes statins. The current treatment provides moderate improvement of lipids. Compliance problems include adherence to diet and adherence to exercise.  Risk factors for coronary artery disease include dyslipidemia, hypertension and post-menopausal.  GAD/depression Currently takes Paxil 30m qd and xanax .562m1 tid.  Has stress at home taking care of husband who is recovering from lung cancer and now haleukemia. COPD/smoking Currently smokes 1 pack per day. Just not interested in stopping right now.  Is not on an inhaler due to cost. GERD Currently takes Protonix q day with good results.   Chronic back pain Currently taking Hydrocodone 10/32530m po tid for relief of lumbar pain and right leg pain.   hypokalemia K supplements daily- no problems   Review of Systems  Constitutional: Negative.   Cardiovascular: Negative.  Negative for palpitations.  Gastrointestinal: Negative.   Musculoskeletal: Positive for back  pain.          Skin: Negative.   Neurological: Negative.   Psychiatric/Behavioral: Negative.        Objective:   Physical Exam  Constitutional: She is oriented to person, place, and time. She appears well-developed and well-nourished.  HENT:  Nose: Nose normal.    Mouth/Throat: Oropharynx is clear and moist.  Eyes: EOM are normal.  Neck: Trachea normal, normal range of motion and full passive range of motion without pain. Neck supple. No JVD present. Carotid bruit is not present. No thyromegaly present.  Cardiovascular: Normal rate, regular rhythm, normal heart sounds and intact distal pulses.  Exam reveals no gallop and no friction rub.   No murmur heard. Pulmonary/Chest: Effort normal and breath sounds normal.  Abdominal: Soft. Bowel sounds are normal. She exhibits no distension and no mass. There is no tenderness.  Musculoskeletal:       Lumbar back: She exhibits decreased range of motion and pain.  Lymphadenopathy:    She has no cervical adenopathy.  Neurological: She is alert and oriented to person, place, and time. She has normal reflexes.  Skin: Skin is warm and dry.  Psychiatric: She has a normal mood and affect. Her behavior is normal. Judgment and thought content normal.   BP 115/72 mmHg  Pulse 78  Temp(Src) 97 F (36.1 C) (Oral)  Ht _0  (1.651 m)  Wt 184 lb (83.462 kg)  BMI 30.62 kg/m2    Assessment & Plan:  1. Benign hypertension Do not add salt todiet - lisinopril-hydrochlorothiazide (PRINZIDE,ZESTORETIC) 10-12.5 MG tablet; Take 1 tablet by mouth daily.  Dispense: 90 tablet; Refill: 1 - CMP14+EGFR  2. COPD (chronic obstructive pulmonary disease) with chronic bronchitis (HCC) Stop smoking  3. Gastroesophageal reflux disease without esophagitis Avoid spicy foods Do not eat 2 hours prior to bedtime - pantoprazole (PROTONIX) 40 MG tablet; Take 1 tablet (40 mg total) by mouth daily.  Dispense: 90 tablet; Refill: 0  4. Anxiety Stress management - ALPRAZolam (XANAX) 1 MG tablet; Take 1 tablet (1 mg total) by mouth 3 (three) times daily as needed for anxiety.  Dispense: 90 tablet; Refill: 1  5. Hypokalemia  6. Chronic lower back pain Follow up at next visit for pain control  7. Depression - PARoxetine (PAXIL) 20 MG  tablet; Take 1 tablet (20 mg total) by mouth daily.  Dispense: 90 tablet; Refill: 0  8. Hyperlipidemia with target LDL less than 100 Low fat diet - simvastatin (ZOCOR) 40 MG tablet; TAKE 1 TABLET BY MOUTH AT BEEDTIME  Dispense: 90 tablet; Refill: 0 - Lipid panel  9. BMI 32.0-32.9,adult Discussed diet and exercise for person with BMI >25 Will recheck weight in 3-6 months  10. Chronic diarrhea - dicyclomine (BENTYL) 10 MG capsule; Take 1 capsule (10 mg total) by mouth 4 (four) times daily as needed for spasms.  Dispense: 120 capsule; Refill: 4    Labs pending Health maintenance reviewed Diet and exercise encouraged Continue all meds Follow up  In 3 month   Hodgkins, FNP

## 2016-04-09 LAB — LIPID PANEL
CHOLESTEROL TOTAL: 151 mg/dL (ref 100–199)
Chol/HDL Ratio: 2 ratio units (ref 0.0–4.4)
HDL: 75 mg/dL (ref >39)
LDL Calculated: 54 mg/dL (ref 0–99)
TRIGLYCERIDES: 112 mg/dL (ref 0–149)
VLDL Cholesterol Cal: 22 mg/dL (ref 5–40)

## 2016-04-09 LAB — CMP14+EGFR
ALBUMIN: 4.1 g/dL (ref 3.6–4.8)
ALK PHOS: 89 IU/L (ref 39–117)
ALT: 17 IU/L (ref 0–32)
AST: 18 IU/L (ref 0–40)
Albumin/Globulin Ratio: 1.7 (ref 1.2–2.2)
BILIRUBIN TOTAL: 0.4 mg/dL (ref 0.0–1.2)
BUN / CREAT RATIO: 12 (ref 12–28)
BUN: 7 mg/dL — AB (ref 8–27)
CHLORIDE: 99 mmol/L (ref 96–106)
CO2: 28 mmol/L (ref 18–29)
Calcium: 10.2 mg/dL (ref 8.7–10.3)
Creatinine, Ser: 0.6 mg/dL (ref 0.57–1.00)
GFR calc Af Amer: 108 mL/min/{1.73_m2} (ref >59)
GFR calc non Af Amer: 94 mL/min/{1.73_m2} (ref >59)
GLUCOSE: 92 mg/dL (ref 65–99)
Globulin, Total: 2.4 g/dL (ref 1.5–4.5)
Potassium: 4.5 mmol/L (ref 3.5–5.2)
Sodium: 140 mmol/L (ref 134–144)
Total Protein: 6.5 g/dL (ref 6.0–8.5)

## 2016-04-10 LAB — TOXASSURE SELECT 13 (MW), URINE: PDF: 0

## 2016-04-11 ENCOUNTER — Encounter: Payer: Self-pay | Admitting: *Deleted

## 2016-04-25 ENCOUNTER — Encounter: Payer: Self-pay | Admitting: Family Medicine

## 2016-04-25 ENCOUNTER — Ambulatory Visit (INDEPENDENT_AMBULATORY_CARE_PROVIDER_SITE_OTHER): Payer: Medicare Other | Admitting: Family Medicine

## 2016-04-25 ENCOUNTER — Ambulatory Visit (INDEPENDENT_AMBULATORY_CARE_PROVIDER_SITE_OTHER): Payer: Medicare Other

## 2016-04-25 VITALS — BP 108/56 | Temp 97.3°F | Ht 65.0 in | Wt 183.4 lb

## 2016-04-25 DIAGNOSIS — M25571 Pain in right ankle and joints of right foot: Secondary | ICD-10-CM

## 2016-04-25 MED ORDER — ANKLE STIRRUP BRACE/RIGHT MISC: 1.0000 | Freq: Every day | 0 refills | Status: DC

## 2016-04-25 NOTE — Patient Instructions (Addendum)
Generic Ankle Exercises EXERCISES RANGE OF MOTION (ROM) AND STRETCHING EXERCISES These exercises may help you when beginning to rehabilitate your injury. Your symptoms may resolve with or without further involvement from your physician, physical therapist or athletic trainer. While completing these exercises, remember:   Restoring tissue flexibility helps normal motion to return to the joints. This allows healthier, less painful movement and activity.  An effective stretch should be held for at least 30 seconds.  A stretch should never be painful. You should only feel a gentle lengthening or release in the stretched tissue. RANGE OF MOTION - Dorsi/Plantar Flexion  While sitting with your right / left knee straight, draw the top of your foot upwards by flexing your ankle. Then reverse the motion, pointing your toes downward.  Hold each position for ____5______ seconds.  After completing your first set of exercises, repeat this exercise with your knee bent. Repeat ____3______ times. Complete this exercise _______2___ times per day.  RANGE OF MOTION - Ankle Alphabet  Imagine your right / left big toe is a pen.  Keeping your hip and knee still, write out the entire alphabet with your "pen." Make the letters as large as you can without increasing any discomfort. Repeat _______1___ times. Complete this exercise ___2_______ times per day.  RANGE OF MOTION - Ankle Dorsiflexion, Active Assisted   Remove shoes and sit on a chair that is preferably not on a carpeted surface.  Place right / left foot under knee. Extend your opposite leg for support.  Keeping your heel down, slide your right / left foot back toward the chair until you feel a stretch at your ankle or calf. If you do not feel a stretch, slide your bottom forward to the edge of the chair while still keeping your heel down.  Hold this stretch for ____10______ seconds. Repeat ____3______ times. Complete this stretch ____2______ times  per day.   STRENGTH - Towel Curls  Sit in a chair positioned on a non-carpeted surface.  Place your foot on a towel, keeping your heel on the floor.  Pull the towel toward your heel by only curling your toes. Keep your heel on the floor. If instructed by your physician, physical therapist or athletic trainer, add weight to the end of the towel. Repeat ____3______ times. Complete this exercise _____2_____ times per day. STRENGTH - Plantar-flexors, Standing  Stand with your feet shoulder width apart. Steady yourself with a wall or table using as little support as needed.  Keeping your weight evenly spread over the width of your feet, rise up on your toes.*  Hold this position for ____10______ seconds. Repeat ______3____ times. Complete this exercise ____2______ times per day.  *If this is too easy, shift your weight toward your right / left leg until you feel challenged. Ultimately, you may be asked to do this exercise with your right / left foot only.

## 2016-04-25 NOTE — Progress Notes (Signed)
Subjective:  Patient ID: [name], female    DOB: 08/17/1946  Age: 70 y.o. MRN: [id number]  CC: Ankle Pain (fell on sunday, injuring ankle)   HPI [name] presents for falling off of the couch yesterday morning at 5:30. She'll follow theNightbefore.SheRolledoverandFelloffandLandedonHerRightSide.SheHasMildBruiseontheLeftElbowbutHerMainConcernIstheRightAnkle.ThereIsPainattheLateralMalleolusandHasModerateBruising.SheIsAbletoWalkonIt.  History [name] has a past medical history of Angina; Anxiety; Arthritis; Complication of anesthesia; COPD (chronic obstructive pulmonary disease) ([place]); Depression; GERD (gastroesophageal reflux disease); Headache(784.0); HPV in female; HTN (hypertension); Hypercholesteremia; Hypercholesterolemia; IBS (irritable bowel syndrome); Pneumonia (2007); PONV (postoperative nausea and vomiting); S/P colonoscopy (05/30/2008); S/P endoscopy (05/15/2008); Shortness of breath; and Stomach ulcer.   She has a past surgical history that includes tumor removed from arm; Carpal tunnel release; Back surgery; Appendectomy; Cholecystectomy (1984); Tubal ligation (1976); maloney dilation (07/28/2011); Esophagogastroduodenoscopy (12/01/2011); and Cervical biopsy w/ loop electrode excision (02/2011).   Her family history includes Diabetes in her brother; Heart disease in her father.She reports that she has been smoking Cigarettes.  She has a 50.00 pack-year smoking history. She does not have any smokeless tobacco history on file. She reports that she does not drink alcohol or use drugs.  Current Outpatient Prescriptions on File Prior to Visit  Medication Sig Dispense Refill  . albuterol (PROVENTIL HFA;VENTOLIN HFA) 108 (90 Base) MCG/ACT inhaler Inhale 2 puffs into the lungs every 6 (six) hours as needed for wheezing or shortness of breath. 1 Inhaler 2  . ALPRAZolam (XANAX) 1 MG tablet Take 1 tablet (1 mg total) by mouth 3 (three) times daily as needed for anxiety. 90  tablet 1  . calcium carbonate (OS-CAL) 600 MG TABS Take 600 mg by mouth daily.     . chlorzoxazone (PARAFON) 500 MG tablet TAKE ONE TABLET BY MOUTH THREE TIMES DAILY AS NEEDED 60 tablet 0  . Cholecalciferol (VITAMIN D3) 1000 UNITS CAPS Take 3,000 mg by mouth daily.     Marland Kitchen dicyclomine (BENTYL) 10 MG capsule Take 1 capsule (10 mg total) by mouth 4 (four) times daily as needed for spasms. 120 capsule 4  . diphenhydrAMINE (BENADRYL) 25 MG tablet Take 25 mg by mouth 2 (two) times daily as needed. For Allergies    . fluticasone (FLONASE) 50 MCG/ACT nasal spray Place 2 sprays into both nostrils daily. 16 g 6  . HYDROcodone-acetaminophen (NORCO) 10-325 MG tablet Take 1 tablet by mouth every 8 (eight) hours as needed. For Pain 90 tablet 0  . HYDROcodone-acetaminophen (NORCO) 10-325 MG tablet Take 1 tablet by mouth every 6 (six) hours as needed. 90 tablet 0  . HYDROcodone-acetaminophen (NORCO) 10-325 MG tablet Take 1 tablet by mouth every 6 (six) hours as needed. 30 tablet 0  . lisinopril-hydrochlorothiazide (PRINZIDE,ZESTORETIC) 10-12.5 MG tablet Take 1 tablet by mouth daily. 90 tablet 1  . Multiple Vitamin (MULITIVITAMIN WITH MINERALS) TABS Take 1 tablet by mouth daily.    . pantoprazole (PROTONIX) 40 MG tablet Take 1 tablet (40 mg total) by mouth daily. 90 tablet 0  . PARoxetine (PAXIL) 20 MG tablet Take 1 tablet (20 mg total) by mouth daily. 90 tablet 0  . simvastatin (ZOCOR) 40 MG tablet TAKE 1 TABLET BY MOUTH AT BEEDTIME 90 tablet 0  . vitamin C (ASCORBIC ACID) 500 MG tablet Take 1,000 mg by mouth daily.      No current facility-administered medications on file prior to visit.     ROS Review of Systems  Constitutional: Negative for activity change, appetite change and fever.  HENT: Negative for congestion, rhinorrhea and sore throat.   Eyes: Negative  for visual disturbance.  Respiratory: Negative.  Negative for cough and shortness of breath.   Cardiovascular: Negative for chest pain and  palpitations.  Gastrointestinal: Negative for abdominal pain, diarrhea and nausea.  Genitourinary: Negative for dysuria.  Musculoskeletal: Positive for arthralgias, joint swelling and myalgias.    Objective:  BP (!) 108/56 (BP Location: Left Arm, Patient Position: Sitting, Cuff Size: Normal)   Temp 97.3 F (36.3 C) (Oral)   Ht 5\' 5"  (1.651 m)   Wt 183 lb 6.4 oz (83.2 kg)   SpO2 95%   BMI 30.52 kg/m   Physical Exam  Constitutional: She appears well-developed and well-nourished.  HENT:  Head: Normocephalic.  Cardiovascular: Normal rate and regular rhythm.   No murmur heard. Pulmonary/Chest: Effort normal and breath sounds normal.  Musculoskeletal: She exhibits tenderness (at right lateral malleolus wit moderate hematoma formation).    Assessment & Plan:   [name] was seen today for ankle pain.  Diagnoses and all orders for this visit:  Acute right ankle pain -     DG Ankle Complete Right  Other orders -     Elastic Bandages & Supports (ANKLE STIRRUP BRACE/RIGHT) MISC; 1 each by Does not apply route daily.   I am having Ms. Schewe start on Ankle Stirrup Brace/Right. I am also having her maintain her Vitamin D3, vitamin C, calcium carbonate, diphenhydrAMINE, multivitamin with minerals, chlorzoxazone, fluticasone, albuterol, HYDROcodone-acetaminophen, HYDROcodone-acetaminophen, HYDROcodone-acetaminophen, ALPRAZolam, PARoxetine, pantoprazole, simvastatin, lisinopril-hydrochlorothiazide, and dicyclomine.  Meds ordered this encounter  Medications  . Elastic Bandages & Supports (ANKLE STIRRUP BRACE/RIGHT) MISC    Sig: 1 each by Does not apply route daily.    Dispense:  1 each    Refill:  0   X-ray shows no fracture of the right ankle.  Follow-up: No Follow-up on file.  Claretta Fraise, M.D.

## 2016-04-26 DIAGNOSIS — M25571 Pain in right ankle and joints of right foot: Secondary | ICD-10-CM | POA: Diagnosis not present

## 2016-06-13 ENCOUNTER — Telehealth: Payer: Self-pay | Admitting: Nurse Practitioner

## 2016-06-13 DIAGNOSIS — M545 Low back pain: Principal | ICD-10-CM

## 2016-06-13 DIAGNOSIS — G8929 Other chronic pain: Secondary | ICD-10-CM

## 2016-06-13 DIAGNOSIS — F419 Anxiety disorder, unspecified: Secondary | ICD-10-CM

## 2016-06-14 MED ORDER — HYDROCODONE-ACETAMINOPHEN 10-325 MG PO TABS: 1.0000 | ORAL_TABLET | Freq: Four times a day (QID) | ORAL | 0 refills | Status: DC | PRN

## 2016-06-14 MED ORDER — ALPRAZOLAM 1 MG PO TABS: 1.0000 mg | ORAL_TABLET | Freq: Three times a day (TID) | ORAL | 1 refills | Status: DC | PRN

## 2016-06-14 NOTE — Telephone Encounter (Signed)
Patient aware that rx is ready for pick up

## 2016-06-14 NOTE — Telephone Encounter (Signed)
X ready for pick up.

## 2016-06-16 ENCOUNTER — Other Ambulatory Visit: Payer: Self-pay | Admitting: Nurse Practitioner

## 2016-06-16 DIAGNOSIS — M545 Low back pain, unspecified: Secondary | ICD-10-CM

## 2016-06-16 DIAGNOSIS — G8929 Other chronic pain: Secondary | ICD-10-CM

## 2016-06-16 MED ORDER — HYDROCODONE-ACETAMINOPHEN 10-325 MG PO TABS: 1.0000 | ORAL_TABLET | Freq: Three times a day (TID) | ORAL | 0 refills | Status: DC | PRN

## 2016-06-24 ENCOUNTER — Encounter: Payer: Self-pay | Admitting: Nurse Practitioner

## 2016-06-24 ENCOUNTER — Ambulatory Visit: Payer: Self-pay | Admitting: Gastroenterology

## 2016-06-24 ENCOUNTER — Ambulatory Visit: Payer: Medicare Other | Admitting: Physician Assistant

## 2016-06-24 ENCOUNTER — Ambulatory Visit (INDEPENDENT_AMBULATORY_CARE_PROVIDER_SITE_OTHER): Payer: Medicare Other | Admitting: Nurse Practitioner

## 2016-06-24 VITALS — BP 152/75 | HR 72 | Temp 98.5°F | Ht 65.0 in | Wt 187.0 lb

## 2016-06-24 DIAGNOSIS — J441 Chronic obstructive pulmonary disease with (acute) exacerbation: Secondary | ICD-10-CM

## 2016-06-24 MED ORDER — AZITHROMYCIN 250 MG PO TABS: ORAL_TABLET | ORAL | 0 refills | Status: DC

## 2016-06-24 NOTE — Patient Instructions (Signed)

## 2016-06-24 NOTE — Progress Notes (Signed)
Subjective:     [name] is a 70 y.o. female here for evaluation of a cough. Onset of symptoms was 8 days ago. Symptoms have been gradually worsening since that time. The cough is barky and productive and is aggravated by nothing. Associated symptoms include: change in voice, shortness of breath and wheezing. Patient does not have a history of asthma. Patient does not have a history of environmental allergens. Patient has not traveled recently. Patient does have a history of smoking. Patient has had a previous chest x-ray. Patient has not had a PPD done.  The following portions of the patient's history were reviewed and updated as appropriate: allergies, current medications, past family history, past medical history, past social history, past surgical history and problem list.  Review of Systems Pertinent items noted in HPI and remainder of comprehensive ROS otherwise negative.    Objective:     BP (!) 152/75   Pulse 72   Temp 98.5 F (36.9 C) (Oral)   Ht 5\' 5"  (1.651 m)   Wt 187 lb (84.8 kg)   BMI 31.12 kg/m  General appearance: alert and cooperative Eyes: conjunctivae/corneas clear. PERRL, EOM's intact. Fundi benign. Ears: normal TM's and external ear canals both ears Nose: clear discharge, moderate congestion, turbinates red, no sinus tenderness Throat: lips, mucosa, and tongue normal; teeth and gums normal Neck: no adenopathy, no carotid bruit, no JVD, supple, symmetrical, trachea midline and thyroid not enlarged, symmetric, no tenderness/mass/nodules Lungs: wheezes bibasilar Heart: regular rate and rhythm, S1, S2 normal, no murmur, click, rub or gallop    Assessment:    COPD with exacerbation    Plan:   Force fluids OTC mucinex as needed Humidifier Try to stop smoking  Meds ordered this encounter  Medications  . azithromycin (ZITHROMAX Z-PAK) 250 MG tablet    Sig: As directed    Dispense:  6 tablet    Refill:  0    Order Specific Question:   Supervising  Provider    Answer:   Eustaquio Maize [[id number]]   [name], FNP

## 2016-07-04 ENCOUNTER — Other Ambulatory Visit: Payer: Self-pay | Admitting: Nurse Practitioner

## 2016-07-04 DIAGNOSIS — F329 Major depressive disorder, single episode, unspecified: Secondary | ICD-10-CM

## 2016-07-04 DIAGNOSIS — E785 Hyperlipidemia, unspecified: Secondary | ICD-10-CM

## 2016-07-04 DIAGNOSIS — F32A Depression, unspecified: Secondary | ICD-10-CM

## 2016-07-19 DIAGNOSIS — Z1231 Encounter for screening mammogram for malignant neoplasm of breast: Secondary | ICD-10-CM | POA: Diagnosis not present

## 2016-07-21 ENCOUNTER — Ambulatory Visit: Payer: Medicare Other | Admitting: Nurse Practitioner

## 2016-07-28 ENCOUNTER — Ambulatory Visit (INDEPENDENT_AMBULATORY_CARE_PROVIDER_SITE_OTHER): Payer: Medicare Other

## 2016-07-28 ENCOUNTER — Encounter: Payer: Self-pay | Admitting: Nurse Practitioner

## 2016-07-28 ENCOUNTER — Ambulatory Visit: Payer: Medicare Other | Admitting: Nurse Practitioner

## 2016-07-28 ENCOUNTER — Ambulatory Visit (INDEPENDENT_AMBULATORY_CARE_PROVIDER_SITE_OTHER): Payer: Medicare Other | Admitting: Nurse Practitioner

## 2016-07-28 VITALS — BP 114/67 | HR 73 | Temp 98.4°F | Ht 65.0 in | Wt 188.0 lb

## 2016-07-28 DIAGNOSIS — F17200 Nicotine dependence, unspecified, uncomplicated: Secondary | ICD-10-CM

## 2016-07-28 DIAGNOSIS — M545 Low back pain, unspecified: Secondary | ICD-10-CM

## 2016-07-28 DIAGNOSIS — Z0000 Encounter for general adult medical examination without abnormal findings: Secondary | ICD-10-CM | POA: Diagnosis not present

## 2016-07-28 DIAGNOSIS — F3342 Major depressive disorder, recurrent, in full remission: Secondary | ICD-10-CM

## 2016-07-28 DIAGNOSIS — E785 Hyperlipidemia, unspecified: Secondary | ICD-10-CM | POA: Diagnosis not present

## 2016-07-28 DIAGNOSIS — K219 Gastro-esophageal reflux disease without esophagitis: Secondary | ICD-10-CM

## 2016-07-28 DIAGNOSIS — F17210 Nicotine dependence, cigarettes, uncomplicated: Secondary | ICD-10-CM | POA: Insufficient documentation

## 2016-07-28 DIAGNOSIS — G8929 Other chronic pain: Secondary | ICD-10-CM

## 2016-07-28 DIAGNOSIS — R300 Dysuria: Secondary | ICD-10-CM

## 2016-07-28 LAB — URINALYSIS, COMPLETE
Bilirubin, UA: NEGATIVE
Glucose, UA: NEGATIVE
Ketones, UA: NEGATIVE
LEUKOCYTES UA: NEGATIVE
Nitrite, UA: NEGATIVE
PH UA: 5 (ref 5.0–7.5)
PROTEIN UA: NEGATIVE
Specific Gravity, UA: 1.015 (ref 1.005–1.030)
UUROB: 0.2 mg/dL (ref 0.2–1.0)

## 2016-07-28 LAB — MICROSCOPIC EXAMINATION: BACTERIA UA: NONE SEEN

## 2016-07-28 MED ORDER — PANTOPRAZOLE SODIUM 40 MG PO TBEC: 40.0000 mg | DELAYED_RELEASE_TABLET | Freq: Every day | ORAL | 1 refills | Status: DC

## 2016-07-28 MED ORDER — HYDROCODONE-ACETAMINOPHEN 10-325 MG PO TABS: 1.0000 | ORAL_TABLET | Freq: Four times a day (QID) | ORAL | 0 refills | Status: DC | PRN

## 2016-07-28 MED ORDER — HYDROCODONE-ACETAMINOPHEN 10-325 MG PO TABS: 1.0000 | ORAL_TABLET | Freq: Three times a day (TID) | ORAL | 0 refills | Status: DC | PRN

## 2016-07-28 MED ORDER — SIMVASTATIN 40 MG PO TABS: ORAL_TABLET | ORAL | 1 refills | Status: DC

## 2016-07-28 MED ORDER — PAROXETINE HCL 20 MG PO TABS: 20.0000 mg | ORAL_TABLET | Freq: Every day | ORAL | 1 refills | Status: DC

## 2016-07-28 NOTE — Patient Instructions (Signed)
Stress and Stress Management Stress is a normal reaction to life events. It is what you feel when life demands more than you are used to or more than you can handle. Some stress can be useful. For example, the stress reaction can help you catch the last bus of the day, study for a test, or meet a deadline at work. But stress that occurs too often or for too long can cause problems. It can affect your emotional health and interfere with relationships and normal daily activities. Too much stress can weaken your immune system and increase your risk for physical illness. If you already have a medical problem, stress can make it worse. CAUSES  All sorts of life events may cause stress. An event that causes stress for one person may not be stressful for another person. Major life events commonly cause stress. These may be positive or negative. Examples include losing your job, moving into a new home, getting married, having a baby, or losing a loved one. Less obvious life events may also cause stress, especially if they occur day after day or in combination. Examples include working long hours, driving in traffic, caring for children, being in debt, or being in a difficult relationship. SIGNS AND SYMPTOMS Stress may cause emotional symptoms including, the following:  Anxiety. This is feeling worried, afraid, on edge, overwhelmed, or out of control.  Anger. This is feeling irritated or impatient.  Depression. This is feeling sad, down, helpless, or guilty.  Difficulty focusing, remembering, or making decisions. Stress may cause physical symptoms, including the following:   Aches and pains. These may affect your head, neck, back, stomach, or other areas of your body.  Tight muscles or clenched jaw.  Low energy or trouble sleeping. Stress may cause unhealthy behaviors, including the following:   Eating to feel better (overeating) or skipping meals.  Sleeping too little, too much, or both.  Working  too much or putting off tasks (procrastination).  Smoking, drinking alcohol, or using drugs to feel better. DIAGNOSIS  Stress is diagnosed through an assessment by your health care provider. Your health care provider will ask questions about your symptoms and any stressful life events.Your health care provider will also ask about your medical history and may order blood tests or other tests. Certain medical conditions and medicine can cause physical symptoms similar to stress. Mental illness can cause emotional symptoms and unhealthy behaviors similar to stress. Your health care provider may refer you to a mental health professional for further evaluation.  TREATMENT  Stress management is the recommended treatment for stress.The goals of stress management are reducing stressful life events and coping with stress in healthy ways.  Techniques for reducing stressful life events include the following:  Stress identification. Self-monitor for stress and identify what causes stress for you. These skills may help you to avoid some stressful events.  Time management. Set your priorities, keep a calendar of events, and learn to say "no." These tools can help you avoid making too many commitments. Techniques for coping with stress include the following:  Rethinking the problem. Try to think realistically about stressful events rather than ignoring them or overreacting. Try to find the positives in a stressful situation rather than focusing on the negatives.  Exercise. Physical exercise can release both physical and emotional tension. The key is to find a form of exercise you enjoy and do it regularly.  Relaxation techniques. These relax the body and mind. Examples include yoga, meditation, tai chi, biofeedback, deep  breathing, progressive muscle relaxation, listening to music, being out in nature, journaling, and other hobbies. Again, the key is to find one or more that you enjoy and can do  regularly.  Healthy lifestyle. Eat a balanced diet, get plenty of sleep, and do not smoke. Avoid using alcohol or drugs to relax.  Strong support network. Spend time with family, friends, or other people you enjoy being around.Express your feelings and talk things over with someone you trust. Counseling or talktherapy with a mental health professional may be helpful if you are having difficulty managing stress on your own. Medicine is typically not recommended for the treatment of stress.Talk to your health care provider if you think you need medicine for symptoms of stress. HOME CARE INSTRUCTIONS  Keep all follow-up visits as directed by your health care provider.  Take all medicines as directed by your health care provider. SEEK MEDICAL CARE IF:  Your symptoms get worse or you start having new symptoms.  You feel overwhelmed by your problems and can no longer manage them on your own. SEEK IMMEDIATE MEDICAL CARE IF:  You feel like hurting yourself or someone else.   This information is not intended to replace advice given to you by your health care provider. Make sure you discuss any questions you have with your health care provider.   Document Released: 03/15/2001 Document Revised: 10/10/2014 Document Reviewed: 05/14/2013 [employer] Interactive Patient Education 2016 [employer]  

## 2016-07-28 NOTE — Progress Notes (Signed)
Subjective:    Patient ID: [name], female    DOB: April 02, 1946, 70 y.o.   MRN: [id number]  Patient here today for annual physical exam ( NO PAP) follow up of chronic medical problems.NO changes since last visit.    Outpatient Encounter Prescriptions as of 07/28/2016  Medication Sig  . albuterol (PROVENTIL HFA;VENTOLIN HFA) 108 (90 Base) MCG/ACT inhaler Inhale 2 puffs into the lungs every 6 (six) hours as needed for wheezing or shortness of breath.  . ALPRAZolam (XANAX) 1 MG tablet Take 1 tablet (1 mg total) by mouth 3 (three) times daily as needed for anxiety.  . calcium carbonate (OS-CAL) 600 MG TABS Take 600 mg by mouth daily.   . chlorzoxazone (PARAFON) 500 MG tablet TAKE ONE TABLET BY MOUTH THREE TIMES DAILY AS NEEDED  . Cholecalciferol (VITAMIN D3) 1000 UNITS CAPS Take 3,000 mg by mouth daily.   Marland Kitchen dicyclomine (BENTYL) 10 MG capsule Take 1 capsule (10 mg total) by mouth 4 (four) times daily as needed for spasms.  . diphenhydrAMINE (BENADRYL) 25 MG tablet Take 25 mg by mouth 2 (two) times daily as needed. For Allergies  . fluticasone (FLONASE) 50 MCG/ACT nasal spray Place 2 sprays into both nostrils daily.  Marland Kitchen HYDROcodone-acetaminophen (NORCO) 10-325 MG tablet Take 1 tablet by mouth every 6 (six) hours as needed.  Marland Kitchen HYDROcodone-acetaminophen (NORCO) 10-325 MG tablet Take 1 tablet by mouth every 6 (six) hours as needed.  Marland Kitchen HYDROcodone-acetaminophen (NORCO) 10-325 MG tablet Take 1 tablet by mouth every 8 (eight) hours as needed. For Pain  . lisinopril-hydrochlorothiazide (PRINZIDE,ZESTORETIC) 10-12.5 MG tablet Take 1 tablet by mouth daily.  . Multiple Vitamin (MULITIVITAMIN WITH MINERALS) TABS Take 1 tablet by mouth daily.  . pantoprazole (PROTONIX) 40 MG tablet Take 1 tablet (40 mg total) by mouth daily.  Marland Kitchen PARoxetine (PAXIL) 20 MG tablet TAKE 1 TABLET (20 MG TOTAL) BY MOUTH DAILY.  . simvastatin (ZOCOR) 40 MG tablet TAKE 1 TABLET BY MOUTH AT BEEDTIME  . vitamin C (ASCORBIC  ACID) 500 MG tablet Take 1,000 mg by mouth daily.     * C/o dysuria and blood in urine- started 3-4 days ago- has been taking AZO OTC which has helped some.  Hypertension  This is a chronic problem. The current episode started more than 1 year ago. The problem is unchanged. The problem is controlled. Pertinent negatives include no palpitations. Risk factors for coronary artery disease include dyslipidemia, family history, post-menopausal state and sedentary lifestyle. Past treatments include ACE inhibitors and diuretics. The current treatment provides moderate improvement. Compliance problems include diet and exercise.   Hyperlipidemia  This is a chronic problem. The current episode started more than 1 year ago. Recent lipid tests were reviewed and are variable. Current antihyperlipidemic treatment includes statins. The current treatment provides moderate improvement of lipids. Compliance problems include adherence to diet and adherence to exercise.  Risk factors for coronary artery disease include dyslipidemia, hypertension and post-menopausal.  GAD/depression Currently takes Paxil 60m qd and xanax .560m1 tid.  Has stress at home taking care of husband who is recovering from lung cancer and now haleukemia. COPD/smoking Currently smokes 1 pack per day. Just not interested in stopping right now.  Is not on an inhaler due to cost. GERD Currently takes Protonix q day with good results.   Chronic back pain Currently taking Hydrocodone 10/32547m po tid for relief of lumbar pain and right leg pain.   hypokalemia K supplements daily- no problems   Review  of Systems  Constitutional: Negative.   Cardiovascular: Negative.  Negative for palpitations.  Gastrointestinal: Negative.   Musculoskeletal: Positive for back pain.          Skin: Negative.   Neurological: Negative.   Psychiatric/Behavioral: Negative.        Objective:   Physical Exam  Constitutional: She is oriented to person, place,  and time. She appears well-developed and well-nourished.  HENT:  Nose: Nose normal.  Mouth/Throat: Oropharynx is clear and moist.  Eyes: EOM are normal.  Neck: Trachea normal, normal range of motion and full passive range of motion without pain. Neck supple. No JVD present. Carotid bruit is not present. No thyromegaly present.  Cardiovascular: Normal rate, regular rhythm, normal heart sounds and intact distal pulses.  Exam reveals no gallop and no friction rub.   No murmur heard. Pulmonary/Chest: Effort normal and breath sounds normal.  Abdominal: Soft. Bowel sounds are normal. She exhibits no distension and no mass. There is no tenderness.  Musculoskeletal:       Lumbar back: She exhibits decreased range of motion and pain.  Lymphadenopathy:    She has no cervical adenopathy.  Neurological: She is alert and oriented to person, place, and time. She has normal reflexes.  Skin: Skin is warm and dry.  Psychiatric: She has a normal mood and affect. Her behavior is normal. Judgment and thought content normal.   BP 114/67   Pulse 73   Temp 98.4 F (36.9 C) (Oral)   Ht 5' 5" (1.651 m)   Wt 188 lb (85.3 kg)   BMI 31.28 kg/m   Chest x ray no change since previous mild bronchitic changes-Preliminary reading by Ronnald Collum, FNP  [facility]   EKG- NSR- [name], FNP   Assessment & Plan:  1. Annual physical exam - Urinalysis, Complete - CBC with Differential/Platelet - Thyroid Panel With TSH  2. Chronic midline low back pain without sciatica Moist heat to back  no heavy lifting - HYDROcodone-acetaminophen (NORCO) 10-325 MG tablet; Take 1 tablet by mouth every 6 (six) hours as needed.  Dispense: 90 tablet; Refill: 0 - HYDROcodone-acetaminophen (NORCO) 10-325 MG tablet; Take 1 tablet by mouth every 6 (six) hours as needed.  Dispense: 90 tablet; Refill: 0 - HYDROcodone-acetaminophen (NORCO) 10-325 MG tablet; Take 1 tablet by mouth every 8 (eight) hours as needed. For Pain  Dispense:  90 tablet; Refill: 0  3. Recurrent major depressive disorder, in full remission ([place]) Continue stress management - PARoxetine (PAXIL) 20 MG tablet; Take 1 tablet (20 mg total) by mouth daily.  Dispense: 90 tablet; Refill: 1  4. Gastroesophageal reflux disease without esophagitis Avoid spicy foods Do not eat 2 hours prior to bedtime - pantoprazole (PROTONIX) 40 MG tablet; Take 1 tablet (40 mg total) by mouth daily.  Dispense: 90 tablet; Refill: 1  5. Hyperlipidemia with target LDL less than 100 Low fat diet - simvastatin (ZOCOR) 40 MG tablet; TAKE 1 TABLET BY MOUTH AT BEEDTIME  Dispense: 90 tablet; Refill: 1 - EKG 12-Lead - CMP14+EGFR - Lipid panel  6. Smoker Smoking cessation encouraged - DG Chest 2 View; Future    Labs pending Health maintenance reviewed Diet and exercise encouraged Continue all meds Follow up  In 3 months   [place], FNP

## 2016-07-29 ENCOUNTER — Telehealth: Payer: Self-pay | Admitting: Nurse Practitioner

## 2016-07-29 LAB — CBC WITH DIFFERENTIAL/PLATELET
BASOS ABS: 0 10*3/uL (ref 0.0–0.2)
Basos: 1 %
EOS (ABSOLUTE): 0.6 10*3/uL — ABNORMAL HIGH (ref 0.0–0.4)
EOS: 9 %
HEMATOCRIT: 41.1 % (ref 34.0–46.6)
Hemoglobin: 13.4 g/dL (ref 11.1–15.9)
Immature Grans (Abs): 0 10*3/uL (ref 0.0–0.1)
Immature Granulocytes: 0 %
LYMPHS ABS: 1.7 10*3/uL (ref 0.7–3.1)
Lymphs: 23 %
MCH: 28.9 pg (ref 26.6–33.0)
MCHC: 32.6 g/dL (ref 31.5–35.7)
MCV: 89 fL (ref 79–97)
MONOS ABS: 0.7 10*3/uL (ref 0.1–0.9)
Monocytes: 10 %
Neutrophils Absolute: 4.1 10*3/uL (ref 1.4–7.0)
Neutrophils: 57 %
Platelets: 210 10*3/uL (ref 150–379)
RBC: 4.63 x10E6/uL (ref 3.77–5.28)
RDW: 13.3 % (ref 12.3–15.4)
WBC: 7.2 10*3/uL (ref 3.4–10.8)

## 2016-07-29 LAB — THYROID PANEL WITH TSH
Free Thyroxine Index: 1.8 (ref 1.2–4.9)
T3 Uptake Ratio: 24 % (ref 24–39)
T4 TOTAL: 7.6 ug/dL (ref 4.5–12.0)
TSH: 0.878 u[IU]/mL (ref 0.450–4.500)

## 2016-07-29 LAB — LIPID PANEL
CHOL/HDL RATIO: 2.1 ratio (ref 0.0–4.4)
Cholesterol, Total: 136 mg/dL (ref 100–199)
HDL: 65 mg/dL (ref >39)
LDL Calculated: 47 mg/dL (ref 0–99)
Triglycerides: 121 mg/dL (ref 0–149)
VLDL CHOLESTEROL CAL: 24 mg/dL (ref 5–40)

## 2016-07-29 LAB — CMP14+EGFR
ALT: 9 IU/L (ref 0–32)
AST: 18 IU/L (ref 0–40)
Albumin/Globulin Ratio: 1.8 (ref 1.2–2.2)
Albumin: 4.1 g/dL (ref 3.6–4.8)
Alkaline Phosphatase: 70 IU/L (ref 39–117)
BUN/Creatinine Ratio: 12 (ref 12–28)
BUN: 8 mg/dL (ref 8–27)
Bilirubin Total: 0.2 mg/dL (ref 0.0–1.2)
CALCIUM: 9.6 mg/dL (ref 8.7–10.3)
CO2: 28 mmol/L (ref 18–29)
CREATININE: 0.69 mg/dL (ref 0.57–1.00)
Chloride: 98 mmol/L (ref 96–106)
GFR, EST AFRICAN AMERICAN: 103 mL/min/{1.73_m2} (ref >59)
GFR, EST NON AFRICAN AMERICAN: 90 mL/min/{1.73_m2} (ref >59)
GLUCOSE: 96 mg/dL (ref 65–99)
Globulin, Total: 2.3 g/dL (ref 1.5–4.5)
Potassium: 3.6 mmol/L (ref 3.5–5.2)
Sodium: 142 mmol/L (ref 134–144)
TOTAL PROTEIN: 6.4 g/dL (ref 6.0–8.5)

## 2016-07-29 NOTE — Telephone Encounter (Signed)
No infection in urine so no antibiotic needed.

## 2016-07-29 NOTE — Telephone Encounter (Signed)
Her urine did not show UTI- it was clear

## 2016-07-29 NOTE — Telephone Encounter (Signed)
Please review urine results and advise on script for infection. Thanks

## 2016-08-01 ENCOUNTER — Other Ambulatory Visit: Payer: Self-pay

## 2016-08-01 ENCOUNTER — Ambulatory Visit (INDEPENDENT_AMBULATORY_CARE_PROVIDER_SITE_OTHER): Payer: Medicare Other | Admitting: Gastroenterology

## 2016-08-01 ENCOUNTER — Encounter: Payer: Self-pay | Admitting: Gastroenterology

## 2016-08-01 VITALS — BP 136/79 | HR 72 | Temp 97.7°F | Ht 65.0 in | Wt 186.8 lb

## 2016-08-01 DIAGNOSIS — R1310 Dysphagia, unspecified: Secondary | ICD-10-CM | POA: Diagnosis not present

## 2016-08-01 DIAGNOSIS — K529 Noninfective gastroenteritis and colitis, unspecified: Secondary | ICD-10-CM | POA: Diagnosis not present

## 2016-08-01 NOTE — Progress Notes (Signed)
Primary Care Physician:  Chevis Pretty, FNP Primary Gastroenterologist:  Dr. Gala Romney   Chief Complaint  Patient presents with  . Follow-up    HPI:   [name] is a 70 y.o. female presenting today with a history of chronic diarrhea, last seen in 2013.  Last colonoscopy by Dr. Deatra Ina in 2009 with findings normal to cecum, scattered diverticula in sigmoid colon. Last EGD 2013 with mild fibrotic pyloric stenosis s/p dilation with passage of the scope, previously noted gastric ulcer completely healed.   States issues with solid food and liquid dysphagia started about a year ago. "not as bad as first time". No odynophagia. Taking Protonix daily, which helps with reflux symptoms. Goody powders daily.   Bentyl taken as needed for IBS-D. Diarrhea waxes and wanes in intensity. Sometimes won't eat when she goes out for fear of having diarrhea. Can't take the Bentyl as it makes her "sick". Some days will just wake up with diarrhea. Always goes at least once a day, sometimes 4-5 times a day. Notes postprandial symptoms. No rectal bleeding. Abdominal discomfort with diarrhea. Sometimes doesn't make it to the bathroom. States it is overall BETTER than when seeing [place] in the past.  Heme negative in April 2017. Celiac serologies negative in past.   Taking Azo, feels like she has a UTI. Notes urinary burning, frequency. Noted blood in urine. States she will call her PCP back about this.   Past Medical History:  Diagnosis Date  . Angina    with indigestion  . Anxiety   . Arthritis    all over esp. in joints  . Complication of anesthesia   . COPD (chronic obstructive pulmonary disease) ([place])   . Depression   . GERD (gastroesophageal reflux disease)   . Headache(784.0)    normal h/a  . HPV in female   . HTN (hypertension)   . Hypercholesteremia   . Hypercholesterolemia   . IBS (irritable bowel syndrome)   . Pneumonia 2007  . PONV (postoperative nausea and vomiting)   . S/P  colonoscopy 05/30/2008   Dr. Deatra Ina: (records reviewed by AS, NP) findings normal to cecum, scattered diverticula in sigmoid colon   . S/P endoscopy 05/15/2008   Dr. Deatra Ina: (records reviewed by AS,NP): [facility] dilator for stricture distal esophagus, also noted duodenal stricture, op notes state unable to pass 28mm scope through pylorus  . Shortness of breath    with exertion  . Stomach ulcer     Past Surgical History:  Procedure Laterality Date  . APPENDECTOMY     70 years old  . BACK SURGERY     X 2  . BREAST SURGERY    . CARPAL TUNNEL RELEASE     right hand  . CERVICAL BIOPSY  W/ LOOP ELECTRODE EXCISION  02/2011   CIN-2 with focal ectocervical margin involvement. With CIN-1 ECC negative  . CHOLECYSTECTOMY  1984  . ESOPHAGOGASTRODUODENOSCOPY  12/01/2011   Hiatal hernia/Mild fibrotic pyloric stenosis status post dilation with passage of  the scope.  The previously noted gastric ulcer completely healed  The remainder of the gastric mucosa appeared normal  . MALONEY DILATION  07/28/2011   Schatzki's ring s/p 67F with small UES tear as well, small hh, 1cm pyloric channel ulcer, bx benign without H.Pylori. Mobic/goody's at time.  . TUBAL LIGATION  1976  . tumor removed from arm      Current Outpatient Prescriptions  Medication Sig Dispense Refill  . albuterol (PROVENTIL HFA;VENTOLIN HFA) 108 (90 Base)  MCG/ACT inhaler Inhale 2 puffs into the lungs every 6 (six) hours as needed for wheezing or shortness of breath. 1 Inhaler 2  . ALPRAZolam (XANAX) 1 MG tablet Take 1 tablet (1 mg total) by mouth 3 (three) times daily as needed for anxiety. 90 tablet 1  . calcium carbonate (OS-CAL) 600 MG TABS Take 600 mg by mouth daily.     . chlorzoxazone (PARAFON) 500 MG tablet TAKE ONE TABLET BY MOUTH THREE TIMES DAILY AS NEEDED 60 tablet 0  . Cholecalciferol (VITAMIN D3) 1000 UNITS CAPS Take 3,000 mg by mouth daily.     Marland Kitchen dicyclomine (BENTYL) 10 MG capsule Take 1 capsule (10 mg total) by mouth 4 (four)  times daily as needed for spasms. 120 capsule 4  . diphenhydrAMINE (BENADRYL) 25 MG tablet Take 25 mg by mouth 2 (two) times daily as needed. For Allergies    . fluticasone (FLONASE) 50 MCG/ACT nasal spray Place 2 sprays into both nostrils daily. 16 g 6  . HYDROcodone-acetaminophen (NORCO) 10-325 MG tablet Take 1 tablet by mouth every 8 (eight) hours as needed. For Pain 90 tablet 0  . lisinopril-hydrochlorothiazide (PRINZIDE,ZESTORETIC) 10-12.5 MG tablet Take 1 tablet by mouth daily. 90 tablet 1  . Multiple Vitamin (MULITIVITAMIN WITH MINERALS) TABS Take 1 tablet by mouth daily.    . pantoprazole (PROTONIX) 40 MG tablet Take 1 tablet (40 mg total) by mouth daily. 90 tablet 1  . PARoxetine (PAXIL) 20 MG tablet Take 1 tablet (20 mg total) by mouth daily. 90 tablet 1  . simvastatin (ZOCOR) 40 MG tablet TAKE 1 TABLET BY MOUTH AT BEEDTIME 90 tablet 1  . vitamin C (ASCORBIC ACID) 500 MG tablet Take 1,000 mg by mouth daily.      No current facility-administered medications for this visit.     Allergies as of 08/01/2016 - Review Complete 08/01/2016  Allergen Reaction Noted  . Meloxicam Other (See Comments) 11/15/2011    Family History  Problem Relation Age of Onset  . Heart disease Father     CHF  . Diabetes Brother     type 2  . Colon cancer Neg Hx   . Anesthesia problems Neg Hx   . Hypotension Neg Hx   . Malignant hyperthermia Neg Hx   . Pseudochol deficiency Neg Hx   . Colon polyps Neg Hx     Social History   Social History  . Marital status: Married    Spouse name: N/A  . Number of children: 5  . Years of education: N/A   Occupational History  . disability Disability   Social History Main Topics  . Smoking status: Current Every Day Smoker    Packs/day: 1.00    Years: 50.00    Types: Cigarettes  . Smokeless tobacco: Never Used     Comment: since teenager  . Alcohol use No     Comment: hx of ETOH abuse in past, none in 10 years  . Drug use: No  . Sexual activity: No    Other Topics Concern  . Not on file   Social History Narrative   Husband passed away 04/27/15 from lung cancer/leukemia.     Review of Systems: As noted in HPI   Physical Exam: BP 136/79   Pulse 72   Temp 97.7 F (36.5 C) (Oral)   Ht 5\' 5"  (1.651 m)   Wt 186 lb 12.8 oz (84.7 kg)   BMI 31.09 kg/m  General:   Alert and oriented. Pleasant and  cooperative. Well-nourished and well-developed.  Head:  Normocephalic and atraumatic. Eyes:  Without icterus, sclera clear and conjunctiva pink.  Ears:  Normal auditory acuity. Nose:  No deformity, discharge,  or lesions. Mouth:  No deformity or lesions, oral mucosa pink.  Lungs:  Clear to auscultation bilaterally. No wheezes, rales, or rhonchi. No distress.  Heart:  S1, S2 present without murmurs appreciated.  Abdomen:  +BS, soft, non-tender and non-distended. No HSM noted. No guarding or rebound. No masses appreciated.  Rectal:  Deferred  Msk:  Symmetrical without gross deformities. Normal posture. Extremities:  Without edema. Neurologic:  Alert and  oriented x4;  grossly normal neurologically. Psych:  Alert and cooperative. Normal mood and affect.

## 2016-08-01 NOTE — Patient Instructions (Signed)
EGD/Dil [id number]

## 2016-08-01 NOTE — Patient Instructions (Addendum)
We have scheduled you for an upper endoscopy and dilation with Dr. Gala Romney in the near future.  Please call Ronnald Collum back about your urinary symptoms.

## 2016-08-02 ENCOUNTER — Telehealth: Payer: Self-pay

## 2016-08-02 NOTE — Telephone Encounter (Signed)
Pre-op appt 08/08/16 at 8:00am. Called and informed pt. Letter mailed also.

## 2016-08-05 ENCOUNTER — Encounter: Payer: Self-pay | Admitting: Gastroenterology

## 2016-08-05 NOTE — Assessment & Plan Note (Signed)
Improved with Bentyl, supportive measures. Subjective improvement since last seen. Heme negative earlier this year, no concerning lower GI symptoms. Colonoscopy in 2009 by Dr. Deatra Ina normal. If any change from baseline, overt GI bleeding, alarm symptoms, etc., would pursue colonoscopy. For now, continue current management.

## 2016-08-05 NOTE — Patient Instructions (Signed)
[name]  08/05/2016     @PREFPERIOPPHARMACY @   Your procedure is scheduled on  08/11/2016  Report to [facility] at  1245  P.M.  Call this number if you have problems the morning of surgery:  [phone]   Remember:  Do not eat food or drink liquids after midnight.  Take these medicines the morning of surgery with A SIP OF WATER  Xanax, bentyl, benadryl, norco, lisinopril, protonix, paxil. Take your inhaler before you come and bring it with you.   Do not wear jewelry, make-up or nail polish.  Do not wear lotions, powders, or perfumes, or deoderant.  Do not shave 48 hours prior to surgery.  Men may shave face and neck.  Do not bring valuables to the hospital.  [facility] is not responsible for any belongings or valuables.  Contacts, dentures or bridgework may not be worn into surgery.  Leave your suitcase in the car.  After surgery it may be brought to your room.  For patients admitted to the hospital, discharge time will be determined by your treatment team.  Patients discharged the day of surgery will not be allowed to drive home.   Name and phone number of your driver:   family Special instructions:  Follow the diet instructions given to you by Dr Roseanne Kaufman office.  Please read over the following fact sheets that you were given. Anesthesia Post-op Instructions and Care and Recovery After Surgery      Esophagogastroduodenoscopy Esophagogastroduodenoscopy (EGD) is a procedure that is used to examine the lining of the esophagus, stomach, and first part of the small intestine (duodenum). A long, flexible, lighted tube with a camera attached (endoscope) is inserted down the throat to view these organs. This procedure is done to detect problems or abnormalities, such as inflammation, bleeding, ulcers, or growths, in order to treat them. The procedure lasts 5-20 minutes. It is usually an outpatient procedure, but it may need to be performed in a hospital  in emergency cases. LET [facility] CARE PROVIDER KNOW ABOUT:  Any allergies you have.  All medicines you are taking, including vitamins, herbs, eye drops, creams, and over-the-counter medicines.  Previous problems you or members of your family have had with the use of anesthetics.  Any blood disorders you have.  Previous surgeries you have had.  Medical conditions you have. RISKS AND COMPLICATIONS Generally, this is a safe procedure. However, problems can occur and include:  Infection.  Bleeding.  Tearing (perforation) of the esophagus, stomach, or duodenum.  Difficulty breathing or not being able to breathe.  Excessive sweating.  Spasms of the larynx.  Slowed heartbeat.  Low blood pressure. BEFORE THE PROCEDURE  Do not eat or drink anything after midnight on the night before the procedure or as directed by your health care provider.  Do not take your regular medicines before the procedure if your health care provider asks you not to. Ask your health care provider about changing or stopping those medicines.  If you wear dentures, be prepared to remove them before the procedure.  Arrange for someone to drive you home after the procedure. PROCEDURE  A numbing medicine (local anesthetic) may be sprayed in your throat for comfort and to stop you from gagging or coughing.  You will have an IV tube inserted in a vein in your hand or arm. You will receive medicines and fluids through this tube.  You will be given  a medicine to relax you (sedative).  A pain reliever will be given through the IV tube.  A mouth guard may be placed in your mouth to protect your teeth and to keep you from biting on the endoscope.  You will be asked to lie on your left side.  The endoscope will be inserted down your throat and into your esophagus, stomach, and duodenum.  Air will be put through the endoscope to allow your health care provider to clearly view the lining of your  esophagus.  The lining of your esophagus, stomach, and duodenum will be examined. During the exam, your health care provider may:  Remove tissue to be examined under a microscope (biopsy) for inflammation, infection, or other medical problems.  Remove growths.  Remove objects (foreign bodies) that are stuck.  Treat any bleeding with medicines or other devices that stop tissues from bleeding (hot cautery, clipping devices).  Widen (dilate) or stretch narrowed areas of your esophagus and stomach.  The endoscope will be withdrawn. AFTER THE PROCEDURE  You will be taken to a recovery area for observation. Your blood pressure, heart rate, breathing rate, and blood oxygen level will be monitored often until the medicines you were given have worn off.  Do not eat or drink anything until the numbing medicine has worn off and your gag reflex has returned. You may choke.  Your health care provider should be able to discuss his or her findings with you. It will take longer to discuss the test results if any biopsies were taken.   This information is not intended to replace advice given to you by your health care provider. Make sure you discuss any questions you have with your health care provider.   Document Released: 01/20/2005 Document Revised: 10/10/2014 Document Reviewed: 08/22/2012 Elsevier Interactive Patient Education 2016 [place]. Esophagogastroduodenoscopy, Care After Refer to this sheet in the next few weeks. These instructions provide you with information about caring for yourself after your procedure. Your health care provider may also give you more specific instructions. Your treatment has been planned according to current medical practices, but problems sometimes occur. Call your health care provider if you have any problems or questions after your procedure. WHAT TO EXPECT AFTER THE PROCEDURE After your procedure, it is typical to feel:  Soreness in your throat.  Pain with  swallowing.  Sick to your stomach (nauseous).  Bloated.  Dizzy.  Fatigued. HOME CARE INSTRUCTIONS  Do not eat or drink anything until the numbing medicine (local anesthetic) has worn off and your gag reflex has returned. You will know that the local anesthetic has worn off when you can swallow comfortably.  Do not drive or operate machinery until directed by your health care provider.  Take medicines only as directed by your health care provider. SEEK MEDICAL CARE IF:   You cannot stop coughing.  You are not urinating at all or less than usual. SEEK IMMEDIATE MEDICAL CARE IF:  You have difficulty swallowing.  You cannot eat or drink.  You have worsening throat or chest pain.  You have dizziness or lightheadedness or you faint.  You have nausea or vomiting.  You have chills.  You have a fever.  You have severe abdominal pain.  You have black, tarry, or bloody stools.   This information is not intended to replace advice given to you by your health care provider. Make sure you discuss any questions you have with your health care provider.   Document Released: 09/05/2012 Document  Revised: 10/10/2014 Document Reviewed: 09/05/2012 Elsevier Interactive Patient Education 2016 Else[employer]  Esophageal Dilatation Esophageal dilatation is a procedure to open a blocked or narrowed part of the esophagus. The esophagus is the long tube in your throat that carries food and liquid from your mouth to your stomach. The procedure is also called esophageal dilation.  You may need this procedure if you have a buildup of scar tissue in your esophagus that makes it difficult, painful, or even impossible to swallow. This can be caused by gastroesophageal reflux disease (GERD). In rare cases, people need this procedure because they have cancer of the esophagus or a problem with the way food moves through the esophagus. Sometimes you may need to have another dilatation to enlarge the opening  of the esophagus gradually. LET [facility] CARE PROVIDER KNOW ABOUT:   Any allergies you have.  All medicines you are taking, including vitamins, herbs, eye drops, creams, and over-the-counter medicines.  Previous problems you or members of your family have had with the use of anesthetics.  Any blood disorders you have.  Previous surgeries you have had.  Medical conditions you have.  Any antibiotic medicines you are required to take before dental procedures. RISKS AND COMPLICATIONS Generally, this is a safe procedure. However, problems can occur and include:  Bleeding from a tear in the lining of the esophagus.  A hole (perforation) in the esophagus. BEFORE THE PROCEDURE  Do not eat or drink anything after midnight on the night before the procedure or as directed by your health care provider.  Ask your health care provider about changing or stopping your regular medicines. This is especially important if you are taking diabetes medicines or blood thinners.  Plan to have someone take you home after the procedure. PROCEDURE   You will be given a medicine that makes you relaxed and sleepy (sedative).  A medicine may be sprayed or gargled to numb the back of the throat.  Your health care provider can use various instruments to do an esophageal dilatation. During the procedure, the instrument used will be placed in your mouth and passed down into your esophagus. Options include:  Simple dilators. This instrument is carefully placed in the esophagus to stretch it.  Guided wire bougies. In this method, a flexible tube (endoscope) is used to insert a wire into the esophagus. The dilator is passed over this wire to enlarge the esophagus. Then the wire is removed.  Balloon dilators. An endoscope with a small balloon at the end is passed down into the esophagus. Inflating the balloon gently stretches the esophagus and opens it up. AFTER THE PROCEDURE  Your blood pressure, heart  rate, breathing rate, and blood oxygen level will be monitored often until the medicines you were given have worn off.  Your throat may feel slightly sore and will probably still feel numb. This will improve slowly over time.  You will not be allowed to eat or drink until the throat numbness has resolved.  If this is a same-day procedure, you may be allowed to go home once you have been able to drink, urinate, and sit on the edge of the bed without nausea or dizziness.  If this is a same-day procedure, you should have a friend or family member with you for the next 24 hours after the procedure.   This information is not intended to replace advice given to you by your health care provider. Make sure you discuss any questions you have with your health  care provider.   Document Released: 11/10/2005 Document Revised: 10/10/2014 Document Reviewed: 01/29/2014 Elsevier Interactive Patient Education 2016 [place] Monitored anesthesia care is an anesthesia service for a medical procedure. Anesthesia is the loss of the ability to feel pain. It is produced by medicines called anesthetics. It may affect a small area of your body (local anesthesia), a large area of your body (regional anesthesia), or your entire body (general anesthesia). The need for monitored anesthesia care depends your procedure, your condition, and the potential need for regional or general anesthesia. It is often provided during procedures where:   General anesthesia may be needed if there are complications. This is because you need special care when you are under general anesthesia.   You will be under local or regional anesthesia. This is so that you are able to have higher levels of anesthesia if needed.   You will receive calming medicines (sedatives). This is especially the case if sedatives are given to put you in a semi-conscious state of relaxation (deep sedation). This is because the amount of  sedative needed to produce this state can be hard to predict. Too much of a sedative can produce general anesthesia. Monitored anesthesia care is performed by one or more health care providers who have special training in all types of anesthesia. You will need to meet with these health care providers before your procedure. During this meeting, they will ask you about your medical history. They will also give you instructions to follow. (For example, you will need to stop eating and drinking before your procedure. You may also need to stop or change medicines you are taking.) During your procedure, your health care providers will stay with you. They will:   Watch your condition. This includes watching your blood pressure, breathing, and level of pain.   Diagnose and treat problems that occur.   Give medicines if they are needed. These may include calming medicines (sedatives) and anesthetics.   Make sure you are comfortable.  Having monitored anesthesia care does not necessarily mean that you will be under anesthesia. It does mean that your health care providers will be able to manage anesthesia if you need it or if it occurs. It also means that you will be able to have a different type of anesthesia than you are having if you need it. When your procedure is complete, your health care providers will continue to watch your condition. They will make sure any medicines wear off before you are allowed to go home.    This information is not intended to replace advice given to you by your health care provider. Make sure you discuss any questions you have with your health care provider.   Document Released: 06/15/2005 Document Revised: 10/10/2014 Document Reviewed: 10/31/2012 Elsevier Interactive Patient Education 2016 Elsevier Inc. PATIENT INSTRUCTIONS POST-ANESTHESIA  IMMEDIATELY FOLLOWING SURGERY:  Do not drive or operate machinery for the first twenty four hours after surgery.  Do not make any  important decisions for twenty four hours after surgery or while taking narcotic pain medications or sedatives.  If you develop intractable nausea and vomiting or a severe headache please notify your doctor immediately.  FOLLOW-UP:  Please make an appointment with your surgeon as instructed. You do not need to follow up with anesthesia unless specifically instructed to do so.  WOUND CARE INSTRUCTIONS (if applicable):  Keep a dry clean dressing on the anesthesia/puncture wound site if there is drainage.  Once the wound has quit  draining you may leave it open to air.  Generally you should leave the bandage intact for twenty four hours unless there is drainage.  If the epidural site drains for more than 36-48 hours please call the anesthesia department.  QUESTIONS?:  Please feel free to call your physician or the hospital operator if you have any questions, and they will be happy to assist you.

## 2016-08-05 NOTE — Assessment & Plan Note (Signed)
70 year old female with history of multiple dilations, last EGD in 2013 with mild fibrotic pyloric stenosis s/p dilation with passage of scope and previously noted gastric ulcer healed. Continues to use Goody powders despite our recommendations to abstain. Now with recurrent dysphagia. Will proceed with EGD and dilation.   Proceed with upper endoscopy/dilation in the near future with Dr. Gala Romney. The risks, benefits, and alternatives have been discussed in detail with patient. They have stated understanding and desire to proceed.  PROPOFOL due to polypharmacy AVOID NSAIDs, aspirin powders Continue Protonix

## 2016-08-08 ENCOUNTER — Other Ambulatory Visit: Payer: Self-pay

## 2016-08-08 ENCOUNTER — Encounter (HOSPITAL_COMMUNITY): Payer: Self-pay

## 2016-08-08 ENCOUNTER — Inpatient Hospital Stay (HOSPITAL_COMMUNITY): Admission: RE | Admit: 2016-08-08 | Payer: Medicare Other | Source: Ambulatory Visit

## 2016-08-08 ENCOUNTER — Encounter (HOSPITAL_COMMUNITY)
Admission: RE | Admit: 2016-08-08 | Discharge: 2016-08-08 | Disposition: A | Discharge status: Home / Self Care | Payer: Medicare Other | Source: Ambulatory Visit | Attending: Internal Medicine | Admitting: Internal Medicine

## 2016-08-08 DIAGNOSIS — F419 Anxiety disorder, unspecified: Secondary | ICD-10-CM | POA: Diagnosis not present

## 2016-08-08 DIAGNOSIS — F17210 Nicotine dependence, cigarettes, uncomplicated: Secondary | ICD-10-CM | POA: Diagnosis not present

## 2016-08-08 DIAGNOSIS — Z7951 Long term (current) use of inhaled steroids: Secondary | ICD-10-CM | POA: Diagnosis not present

## 2016-08-08 DIAGNOSIS — M159 Polyosteoarthritis, unspecified: Secondary | ICD-10-CM | POA: Diagnosis not present

## 2016-08-08 DIAGNOSIS — K449 Diaphragmatic hernia without obstruction or gangrene: Secondary | ICD-10-CM | POA: Diagnosis not present

## 2016-08-08 DIAGNOSIS — K259 Gastric ulcer, unspecified as acute or chronic, without hemorrhage or perforation: Secondary | ICD-10-CM | POA: Diagnosis not present

## 2016-08-08 DIAGNOSIS — I10 Essential (primary) hypertension: Secondary | ICD-10-CM | POA: Diagnosis not present

## 2016-08-08 DIAGNOSIS — R1310 Dysphagia, unspecified: Secondary | ICD-10-CM | POA: Diagnosis present

## 2016-08-08 DIAGNOSIS — K219 Gastro-esophageal reflux disease without esophagitis: Secondary | ICD-10-CM | POA: Diagnosis not present

## 2016-08-08 DIAGNOSIS — J449 Chronic obstructive pulmonary disease, unspecified: Secondary | ICD-10-CM | POA: Diagnosis not present

## 2016-08-08 DIAGNOSIS — Z79899 Other long term (current) drug therapy: Secondary | ICD-10-CM | POA: Diagnosis not present

## 2016-08-08 DIAGNOSIS — K311 Adult hypertrophic pyloric stenosis: Secondary | ICD-10-CM | POA: Diagnosis not present

## 2016-08-08 DIAGNOSIS — K580 Irritable bowel syndrome with diarrhea: Secondary | ICD-10-CM | POA: Diagnosis not present

## 2016-08-08 DIAGNOSIS — K2970 Gastritis, unspecified, without bleeding: Secondary | ICD-10-CM | POA: Diagnosis not present

## 2016-08-08 DIAGNOSIS — F329 Major depressive disorder, single episode, unspecified: Secondary | ICD-10-CM | POA: Diagnosis not present

## 2016-08-08 DIAGNOSIS — E7800 Pure hypercholesterolemia, unspecified: Secondary | ICD-10-CM | POA: Diagnosis not present

## 2016-08-08 DIAGNOSIS — K222 Esophageal obstruction: Secondary | ICD-10-CM | POA: Diagnosis not present

## 2016-08-08 NOTE — Progress Notes (Signed)
cc'd to pcp 

## 2016-08-11 ENCOUNTER — Encounter (HOSPITAL_COMMUNITY)
Admission: RE | Disposition: A | Discharge status: Home / Self Care | Payer: Self-pay | Source: Ambulatory Visit | Attending: Internal Medicine

## 2016-08-11 ENCOUNTER — Ambulatory Visit (HOSPITAL_COMMUNITY): Payer: Medicare Other | Admitting: Anesthesiology

## 2016-08-11 ENCOUNTER — Ambulatory Visit (HOSPITAL_COMMUNITY)
Admission: RE | Admit: 2016-08-11 | Discharge: 2016-08-11 | Disposition: A | Discharge status: Home / Self Care | Payer: Medicare Other | Source: Ambulatory Visit | Attending: Internal Medicine | Admitting: Internal Medicine

## 2016-08-11 ENCOUNTER — Encounter (HOSPITAL_COMMUNITY): Payer: Self-pay

## 2016-08-11 DIAGNOSIS — K222 Esophageal obstruction: Secondary | ICD-10-CM | POA: Diagnosis not present

## 2016-08-11 DIAGNOSIS — J449 Chronic obstructive pulmonary disease, unspecified: Secondary | ICD-10-CM | POA: Diagnosis not present

## 2016-08-11 DIAGNOSIS — F17210 Nicotine dependence, cigarettes, uncomplicated: Secondary | ICD-10-CM | POA: Insufficient documentation

## 2016-08-11 DIAGNOSIS — E7800 Pure hypercholesterolemia, unspecified: Secondary | ICD-10-CM | POA: Diagnosis not present

## 2016-08-11 DIAGNOSIS — R1310 Dysphagia, unspecified: Secondary | ICD-10-CM | POA: Diagnosis not present

## 2016-08-11 DIAGNOSIS — K259 Gastric ulcer, unspecified as acute or chronic, without hemorrhage or perforation: Secondary | ICD-10-CM | POA: Diagnosis not present

## 2016-08-11 DIAGNOSIS — K2970 Gastritis, unspecified, without bleeding: Secondary | ICD-10-CM | POA: Insufficient documentation

## 2016-08-11 DIAGNOSIS — F329 Major depressive disorder, single episode, unspecified: Secondary | ICD-10-CM | POA: Insufficient documentation

## 2016-08-11 DIAGNOSIS — Z7951 Long term (current) use of inhaled steroids: Secondary | ICD-10-CM | POA: Insufficient documentation

## 2016-08-11 DIAGNOSIS — K449 Diaphragmatic hernia without obstruction or gangrene: Secondary | ICD-10-CM | POA: Insufficient documentation

## 2016-08-11 DIAGNOSIS — K319 Disease of stomach and duodenum, unspecified: Secondary | ICD-10-CM | POA: Diagnosis not present

## 2016-08-11 DIAGNOSIS — K311 Adult hypertrophic pyloric stenosis: Secondary | ICD-10-CM | POA: Diagnosis not present

## 2016-08-11 DIAGNOSIS — M159 Polyosteoarthritis, unspecified: Secondary | ICD-10-CM | POA: Diagnosis not present

## 2016-08-11 DIAGNOSIS — I10 Essential (primary) hypertension: Secondary | ICD-10-CM | POA: Insufficient documentation

## 2016-08-11 DIAGNOSIS — K2960 Other gastritis without bleeding: Secondary | ICD-10-CM | POA: Diagnosis not present

## 2016-08-11 DIAGNOSIS — K219 Gastro-esophageal reflux disease without esophagitis: Secondary | ICD-10-CM | POA: Diagnosis not present

## 2016-08-11 DIAGNOSIS — K580 Irritable bowel syndrome with diarrhea: Secondary | ICD-10-CM | POA: Insufficient documentation

## 2016-08-11 DIAGNOSIS — Z79899 Other long term (current) drug therapy: Secondary | ICD-10-CM | POA: Insufficient documentation

## 2016-08-11 DIAGNOSIS — F419 Anxiety disorder, unspecified: Secondary | ICD-10-CM | POA: Insufficient documentation

## 2016-08-11 HISTORY — PX: ESOPHAGOGASTRODUODENOSCOPY (EGD) WITH PROPOFOL: SHX5813

## 2016-08-11 HISTORY — PX: MALONEY DILATION: SHX5535

## 2016-08-11 HISTORY — PX: BIOPSY: SHX5522

## 2016-08-11 SURGERY — ESOPHAGOGASTRODUODENOSCOPY (EGD) WITH PROPOFOL
Anesthesia: Monitor Anesthesia Care

## 2016-08-11 MED ORDER — PROPOFOL 10 MG/ML IV BOLUS
INTRAVENOUS | Status: AC
  Filled 2016-08-11: qty 20

## 2016-08-11 MED ORDER — FENTANYL CITRATE (PF) 100 MCG/2ML IJ SOLN
25.0000 ug | Freq: Once | INTRAMUSCULAR | Status: AC
  Administered 2016-08-11: 25 ug via INTRAVENOUS

## 2016-08-11 MED ORDER — PROPOFOL 500 MG/50ML IV EMUL
INTRAVENOUS | Status: DC | PRN
  Administered 2016-08-11: 135 ug/kg/min via INTRAVENOUS
  Administered 2016-08-11: 14:00:00 via INTRAVENOUS

## 2016-08-11 MED ORDER — LACTATED RINGERS IV SOLN
INTRAVENOUS | Status: DC
  Administered 2016-08-11: 14:00:00 via INTRAVENOUS

## 2016-08-11 MED ORDER — ONDANSETRON HCL 4 MG/2ML IJ SOLN
INTRAMUSCULAR | Status: AC
  Filled 2016-08-11: qty 2

## 2016-08-11 MED ORDER — CHLORHEXIDINE GLUCONATE CLOTH 2 % EX PADS: 6.0000 | MEDICATED_PAD | Freq: Once | CUTANEOUS | Status: DC

## 2016-08-11 MED ORDER — MIDAZOLAM HCL 2 MG/2ML IJ SOLN
1.0000 mg | INTRAMUSCULAR | Status: DC | PRN
  Administered 2016-08-11: 2 mg via INTRAVENOUS

## 2016-08-11 MED ORDER — SIMETHICONE 40 MG/0.6ML PO SUSP
ORAL | Status: DC | PRN
  Administered 2016-08-11: 100 mL

## 2016-08-11 MED ORDER — MIDAZOLAM HCL 5 MG/5ML IJ SOLN
INTRAMUSCULAR | Status: DC | PRN
  Administered 2016-08-11: 2 mg via INTRAVENOUS

## 2016-08-11 MED ORDER — ONDANSETRON HCL 4 MG/2ML IJ SOLN
4.0000 mg | Freq: Once | INTRAMUSCULAR | Status: AC
  Administered 2016-08-11: 4 mg via INTRAVENOUS

## 2016-08-11 MED ORDER — MIDAZOLAM HCL 2 MG/2ML IJ SOLN
INTRAMUSCULAR | Status: AC
  Filled 2016-08-11: qty 2

## 2016-08-11 MED ORDER — FENTANYL CITRATE (PF) 100 MCG/2ML IJ SOLN
INTRAMUSCULAR | Status: AC
  Filled 2016-08-11: qty 2

## 2016-08-11 NOTE — H&P (View-Only) (Signed)
Primary Care Physician:  Chevis Pretty, FNP Primary Gastroenterologist:  Dr. Gala Romney   Chief Complaint  Patient presents with  . Follow-up    HPI:   [name] is a 70 y.o. female presenting today with a history of chronic diarrhea, last seen in 2013.  Last colonoscopy by Dr. Deatra Ina in 2009 with findings normal to cecum, scattered diverticula in sigmoid colon. Last EGD 2013 with mild fibrotic pyloric stenosis s/p dilation with passage of the scope, previously noted gastric ulcer completely healed.   States issues with solid food and liquid dysphagia started about a year ago. "not as bad as first time". No odynophagia. Taking Protonix daily, which helps with reflux symptoms. Goody powders daily.   Bentyl taken as needed for IBS-D. Diarrhea waxes and wanes in intensity. Sometimes won't eat when she goes out for fear of having diarrhea. Can't take the Bentyl as it makes her "sick". Some days will just wake up with diarrhea. Always goes at least once a day, sometimes 4-5 times a day. Notes postprandial symptoms. No rectal bleeding. Abdominal discomfort with diarrhea. Sometimes doesn't make it to the bathroom. States it is overall BETTER than when seeing [place] in the past.  Heme negative in April 2017. Celiac serologies negative in past.   Taking Azo, feels like she has a UTI. Notes urinary burning, frequency. Noted blood in urine. States she will call her PCP back about this.   Past Medical History:  Diagnosis Date  . Angina    with indigestion  . Anxiety   . Arthritis    all over esp. in joints  . Complication of anesthesia   . COPD (chronic obstructive pulmonary disease) ([place])   . Depression   . GERD (gastroesophageal reflux disease)   . Headache(784.0)    normal h/a  . HPV in female   . HTN (hypertension)   . Hypercholesteremia   . Hypercholesterolemia   . IBS (irritable bowel syndrome)   . Pneumonia 2007  . PONV (postoperative nausea and vomiting)   . S/P  colonoscopy 05/30/2008   Dr. Deatra Ina: (records reviewed by AS, NP) findings normal to cecum, scattered diverticula in sigmoid colon   . S/P endoscopy 05/15/2008   Dr. Deatra Ina: (records reviewed by AS,NP): [facility] dilator for stricture distal esophagus, also noted duodenal stricture, op notes state unable to pass 5mm scope through pylorus  . Shortness of breath    with exertion  . Stomach ulcer     Past Surgical History:  Procedure Laterality Date  . APPENDECTOMY     70 years old  . BACK SURGERY     X 2  . BREAST SURGERY    . CARPAL TUNNEL RELEASE     right hand  . CERVICAL BIOPSY  W/ LOOP ELECTRODE EXCISION  02/2011   CIN-2 with focal ectocervical margin involvement. With CIN-1 ECC negative  . CHOLECYSTECTOMY  1984  . ESOPHAGOGASTRODUODENOSCOPY  12/01/2011   Hiatal hernia/Mild fibrotic pyloric stenosis status post dilation with passage of  the scope.  The previously noted gastric ulcer completely healed  The remainder of the gastric mucosa appeared normal  . MALONEY DILATION  07/28/2011   Schatzki's ring s/p 83F with small UES tear as well, small hh, 1cm pyloric channel ulcer, bx benign without H.Pylori. Mobic/goody's at time.  . TUBAL LIGATION  1976  . tumor removed from arm      Current Outpatient Prescriptions  Medication Sig Dispense Refill  . albuterol (PROVENTIL HFA;VENTOLIN HFA) 108 (90 Base)  MCG/ACT inhaler Inhale 2 puffs into the lungs every 6 (six) hours as needed for wheezing or shortness of breath. 1 Inhaler 2  . ALPRAZolam (XANAX) 1 MG tablet Take 1 tablet (1 mg total) by mouth 3 (three) times daily as needed for anxiety. 90 tablet 1  . calcium carbonate (OS-CAL) 600 MG TABS Take 600 mg by mouth daily.     . chlorzoxazone (PARAFON) 500 MG tablet TAKE ONE TABLET BY MOUTH THREE TIMES DAILY AS NEEDED 60 tablet 0  . Cholecalciferol (VITAMIN D3) 1000 UNITS CAPS Take 3,000 mg by mouth daily.     Marland Kitchen dicyclomine (BENTYL) 10 MG capsule Take 1 capsule (10 mg total) by mouth 4 (four)  times daily as needed for spasms. 120 capsule 4  . diphenhydrAMINE (BENADRYL) 25 MG tablet Take 25 mg by mouth 2 (two) times daily as needed. For Allergies    . fluticasone (FLONASE) 50 MCG/ACT nasal spray Place 2 sprays into both nostrils daily. 16 g 6  . HYDROcodone-acetaminophen (NORCO) 10-325 MG tablet Take 1 tablet by mouth every 8 (eight) hours as needed. For Pain 90 tablet 0  . lisinopril-hydrochlorothiazide (PRINZIDE,ZESTORETIC) 10-12.5 MG tablet Take 1 tablet by mouth daily. 90 tablet 1  . Multiple Vitamin (MULITIVITAMIN WITH MINERALS) TABS Take 1 tablet by mouth daily.    . pantoprazole (PROTONIX) 40 MG tablet Take 1 tablet (40 mg total) by mouth daily. 90 tablet 1  . PARoxetine (PAXIL) 20 MG tablet Take 1 tablet (20 mg total) by mouth daily. 90 tablet 1  . simvastatin (ZOCOR) 40 MG tablet TAKE 1 TABLET BY MOUTH AT BEEDTIME 90 tablet 1  . vitamin C (ASCORBIC ACID) 500 MG tablet Take 1,000 mg by mouth daily.      No current facility-administered medications for this visit.     Allergies as of 08/01/2016 - Review Complete 08/01/2016  Allergen Reaction Noted  . Meloxicam Other (See Comments) 11/15/2011    Family History  Problem Relation Age of Onset  . Heart disease Father     CHF  . Diabetes Brother     type 2  . Colon cancer Neg Hx   . Anesthesia problems Neg Hx   . Hypotension Neg Hx   . Malignant hyperthermia Neg Hx   . Pseudochol deficiency Neg Hx   . Colon polyps Neg Hx     Social History   Social History  . Marital status: Married    Spouse name: N/A  . Number of children: 5  . Years of education: N/A   Occupational History  . disability Disability   Social History Main Topics  . Smoking status: Current Every Day Smoker    Packs/day: 1.00    Years: 50.00    Types: Cigarettes  . Smokeless tobacco: Never Used     Comment: since teenager  . Alcohol use No     Comment: hx of ETOH abuse in past, none in 10 years  . Drug use: No  . Sexual activity: No    Other Topics Concern  . Not on file   Social History Narrative   Husband passed away 04/21/2015 from lung cancer/leukemia.     Review of Systems: As noted in HPI   Physical Exam: BP 136/79   Pulse 72   Temp 97.7 F (36.5 C) (Oral)   Ht 5\' 5"  (1.651 m)   Wt 186 lb 12.8 oz (84.7 kg)   BMI 31.09 kg/m  General:   Alert and oriented. Pleasant and  cooperative. Well-nourished and well-developed.  Head:  Normocephalic and atraumatic. Eyes:  Without icterus, sclera clear and conjunctiva pink.  Ears:  Normal auditory acuity. Nose:  No deformity, discharge,  or lesions. Mouth:  No deformity or lesions, oral mucosa pink.  Lungs:  Clear to auscultation bilaterally. No wheezes, rales, or rhonchi. No distress.  Heart:  S1, S2 present without murmurs appreciated.  Abdomen:  +BS, soft, non-tender and non-distended. No HSM noted. No guarding or rebound. No masses appreciated.  Rectal:  Deferred  Msk:  Symmetrical without gross deformities. Normal posture. Extremities:  Without edema. Neurologic:  Alert and  oriented x4;  grossly normal neurologically. Psych:  Alert and cooperative. Normal mood and affect.

## 2016-08-11 NOTE — Transfer of Care (Signed)
Immediate Anesthesia Transfer of Care Note  Patient: [name]  Procedure(s) Performed: Procedure(s) with comments: ESOPHAGOGASTRODUODENOSCOPY (EGD) WITH PROPOFOL (N/A) - 2:15 PM MALONEY DILATION (N/A) BIOPSY - gastric bx's  Patient Location: PACU  Anesthesia Type:MAC  Level of Consciousness: awake and patient cooperative  Airway & Oxygen Therapy: Patient Spontanous Breathing and Patient connected to face mask oxygen  Post-op Assessment: Report given to RN, Post -op Vital signs reviewed and stable and Patient moving all extremities  Post vital signs: Reviewed and stable  Last Vitals:  Vitals:   08/11/16 1324  Temp: 36.6 C    Last Pain:  Vitals:   08/11/16 1324  TempSrc: Oral      Patients Stated Pain Goal: 6 ([id number])  Complications: No apparent anesthesia complications

## 2016-08-11 NOTE — Discharge Instructions (Addendum)
EGD Discharge instructions Please read the instructions outlined below and refer to this sheet in the next few weeks. These discharge instructions provide you with general information on caring for yourself after you leave the hospital. Your doctor may also give you specific instructions. While your treatment has been planned according to the most current medical practices available, unavoidable complications occasionally occur. If you have any problems or questions after discharge, please call your doctor. ACTIVITY  You may resume your regular activity but move at a slower pace for the next 24 hours.   Take frequent rest periods for the next 24 hours.   Walking will help expel (get rid of) the air and reduce the bloated feeling in your abdomen.   No driving for 24 hours (because of the anesthesia (medicine) used during the test).   You may shower.   Do not sign any important legal documents or operate any machinery for 24 hours (because of the anesthesia used during the test).  NUTRITION  Drink plenty of fluids.   You may resume your normal diet.   Begin with a light meal and progress to your normal diet.   Avoid alcoholic beverages for 24 hours or as instructed by your caregiver.  MEDICATIONS  You may resume your normal medications unless your caregiver tells you otherwise.  WHAT YOU CAN EXPECT TODAY  You may experience abdominal discomfort such as a feeling of fullness or gas pains.  FOLLOW-UP  Your doctor will discuss the results of your test with you.  SEEK IMMEDIATE MEDICAL ATTENTION IF ANY OF THE FOLLOWING OCCUR:  Excessive nausea (feeling sick to your stomach) and/or vomiting.   Severe abdominal pain and distention (swelling).   Trouble swallowing.   Temperature over 101 F (37.8 C).   Rectal bleeding or vomiting of blood.     GERD and peptic ulcer information provided  Increase Protonix to 40 mg twice daily  Need to avoid aspirin-containing products  including Goody powders  Further recommendations to follow pending review of pathology report  Office visit with [place] in 12 weeks.  Peptic Ulcer A peptic ulcer is a painful sore in the lining of your esophagus, stomach, or in the first part of your small intestine. The main causes of an ulcer can be:  An infection.  Using certain pain medicines too often or too much.  Smoking. HOME CARE  Avoid smoking, alcohol, and caffeine.  Avoid foods that bother you.  Only take medicine as told by your doctor. Do not take any medicines your doctor has not approved.  Keep all doctor visits as told. GET HELP IF:  You do not get better in 7 days after starting treatment.  You keep having an upset stomach (indigestion) or heartburn. GET HELP RIGHT AWAY IF:  You have sudden, sharp, or lasting belly (abdominal) pain.  You have bloody, black, or tarry poop (stool).  You throw up (vomit) blood or your throw up looks like coffee grounds.  You get light-headed, weak, or feel like you will pass out (faint).  You get sweaty or feel sticky and cold to the touch (clammy). MAKE SURE YOU:   Understand these instructions.  Will watch your condition.  Will get help right away if you are not doing well or get worse.   This information is not intended to replace advice given to you by your health care provider. Make sure you discuss any questions you have with your health care provider.   Document Released: 12/14/2009 Document Revised:  10/10/2014 Document Reviewed: 04/18/2012 Elsevier Interactive Patient Education 2016 [place].  Gastroesophageal Reflux Disease, Adult Normally, food travels down the esophagus and stays in the stomach to be digested. However, when a person has gastroesophageal reflux disease (GERD), food and stomach acid move back up into the esophagus. When this happens, the esophagus becomes sore and inflamed. Over time, GERD can create small holes (ulcers) in the lining of  the esophagus.  CAUSES This condition is caused by a problem with the muscle between the esophagus and the stomach (lower esophageal sphincter, or LES). Normally, the LES muscle closes after food passes through the esophagus to the stomach. When the LES is weakened or abnormal, it does not close properly, and that allows food and stomach acid to go back up into the esophagus. The LES can be weakened by certain dietary substances, medicines, and medical conditions, including:  Tobacco use.  Pregnancy.  Having a hiatal hernia.  Heavy alcohol use.  Certain foods and beverages, such as coffee, chocolate, onions, and peppermint. RISK FACTORS This condition is more likely to develop in:  People who have an increased body weight.  People who have connective tissue disorders.  People who use NSAID medicines. SYMPTOMS Symptoms of this condition include:  Heartburn.  Difficult or painful swallowing.  The feeling of having a lump in the throat.  Abitter taste in the mouth.  Bad breath.  Having a large amount of saliva.  Having an upset or bloated stomach.  Belching.  Chest pain.  Shortness of breath or wheezing.  Ongoing (chronic) cough or a night-time cough.  Wearing away of tooth enamel.  Weight loss. Different conditions can cause chest pain. Make sure to see your health care provider if you experience chest pain. DIAGNOSIS Your health care provider will take a medical history and perform a physical exam. To determine if you have mild or severe GERD, your health care provider may also monitor how you respond to treatment. You may also have other tests, including:  An endoscopy toexamine your stomach and esophagus with a small camera.  A test thatmeasures the acidity level in your esophagus.  A test thatmeasures how much pressure is on your esophagus.  A barium swallow or modified barium swallow to show the shape, size, and functioning of your  esophagus. TREATMENT The goal of treatment is to help relieve your symptoms and to prevent complications. Treatment for this condition may vary depending on how severe your symptoms are. Your health care provider may recommend:  Changes to your diet.  Medicine.  Surgery. HOME CARE INSTRUCTIONS Diet  Follow a diet as recommended by your health care provider. This may involve avoiding foods and drinks such as:  Coffee and tea (with or without caffeine).  Drinks that containalcohol.  Energy drinks and sports drinks.  Carbonated drinks or sodas.  Chocolate and cocoa.  Peppermint and mint flavorings.  Garlic and onions.  Horseradish.  Spicy and acidic foods, including peppers, chili powder, curry powder, vinegar, hot sauces, and barbecue sauce.  Citrus fruit juices and citrus fruits, such as oranges, lemons, and limes.  Tomato-based foods, such as red sauce, chili, salsa, and pizza with red sauce.  Fried and fatty foods, such as donuts, french fries, potato chips, and high-fat dressings.  High-fat meats, such as hot dogs and fatty cuts of red and white meats, such as rib eye steak, sausage, ham, and bacon.  High-fat dairy items, such as whole milk, butter, and cream cheese.  Eat small, frequent meals  instead of large meals.  Avoid drinking large amounts of liquid with your meals.  Avoid eating meals during the 2-3 hours before bedtime.  Avoid lying down right after you eat.  Do not exercise right after you eat. General Instructions  Pay attention to any changes in your symptoms.  Take over-the-counter and prescription medicines only as told by your health care provider. Do not take aspirin, ibuprofen, or other NSAIDs unless your health care provider told you to do so.  Do not use any tobacco products, including cigarettes, chewing tobacco, and e-cigarettes. If you need help quitting, ask your health care provider.  Wear loose-fitting clothing. Do not wear  anything tight around your waist that causes pressure on your abdomen.  Raise (elevate) the head of your bed 6 inches (15cm).  Try to reduce your stress, such as with yoga or meditation. If you need help reducing stress, ask your health care provider.  If you are overweight, reduce your weight to an amount that is healthy for you. Ask your health care provider for guidance about a safe weight loss goal.  Keep all follow-up visits as told by your health care provider. This is important. SEEK MEDICAL CARE IF:  You have new symptoms.  You have unexplained weight loss.  You have difficulty swallowing, or it hurts to swallow.  You have wheezing or a persistent cough.  Your symptoms do not improve with treatment.  You have a hoarse voice. SEEK IMMEDIATE MEDICAL CARE IF:  You have pain in your arms, neck, jaw, teeth, or back.  You feel sweaty, dizzy, or light-headed.  You have chest pain or shortness of breath.  You vomit and your vomit looks like blood or coffee grounds.  You faint.  Your stool is bloody or black.  You cannot swallow, drink, or eat.   This information is not intended to replace advice given to you by your health care provider. Make sure you discuss any questions you have with your health care provider.   Document Released: 06/29/2005 Document Revised: 06/10/2015 Document Reviewed: 01/14/2015 Elsevier Interactive Patient Education 2016 [place] for Gastroesophageal Reflux Disease, Adult When you have gastroesophageal reflux disease (GERD), the foods you eat and your eating habits are very important. Choosing the right foods can help ease the discomfort of GERD. WHAT GENERAL GUIDELINES DO I NEED TO FOLLOW?  Choose fruits, vegetables, whole grains, low-fat dairy products, and low-fat meat, fish, and poultry.  Limit fats such as oils, salad dressings, butter, nuts, and avocado.  Keep a food diary to identify foods that cause  symptoms.  Avoid foods that cause reflux. These may be different for different people.  Eat frequent small meals instead of three large meals each day.  Eat your meals slowly, in a relaxed setting.  Limit fried foods.  Cook foods using methods other than frying.  Avoid drinking alcohol.  Avoid drinking large amounts of liquids with your meals.  Avoid bending over or lying down until 2-3 hours after eating. WHAT FOODS ARE NOT RECOMMENDED? The following are some foods and drinks that may worsen your symptoms: Vegetables Tomatoes. Tomato juice. Tomato and spaghetti sauce. Chili peppers. Onion and garlic. Horseradish. Fruits Oranges, grapefruit, and lemon (fruit and juice). Meats High-fat meats, fish, and poultry. This includes hot dogs, ribs, ham, sausage, salami, and bacon. Dairy Whole milk and chocolate milk. Sour cream. Cream. Butter. Ice cream. Cream cheese.  Beverages Coffee and tea, with or without caffeine. Carbonated beverages or energy drinks. Condiments  Hot sauce. Barbecue sauce.  Sweets/Desserts Chocolate and cocoa. Donuts. Peppermint and spearmint. Fats and Oils High-fat foods, including [place] fries and potato chips. Other Vinegar. Strong spices, such as black pepper, white pepper, red pepper, cayenne, curry powder, cloves, ginger, and chili powder. The items listed above may not be a complete list of foods and beverages to avoid. Contact your dietitian for more information.   This information is not intended to replace advice given to you by your health care provider. Make sure you discuss any questions you have with your health care provider.   Document Released: 09/19/2005 Document Revised: 10/10/2014 Document Reviewed: 07/24/2013 Elsevier Interactive Patient Education [employer].

## 2016-08-11 NOTE — Anesthesia Postprocedure Evaluation (Signed)
Anesthesia Post Note  Patient: [name]  Procedure(s) Performed: Procedure(s) (LRB): ESOPHAGOGASTRODUODENOSCOPY (EGD) WITH PROPOFOL (N/A) MALONEY DILATION (N/A) BIOPSY  Patient location during evaluation: PACU Anesthesia Type: MAC Level of consciousness: awake and alert, oriented and patient cooperative Pain management: pain level controlled Vital Signs Assessment: post-procedure vital signs reviewed and stable Respiratory status: spontaneous breathing, nonlabored ventilation and respiratory function stable Cardiovascular status: blood pressure returned to baseline and stable Postop Assessment: no signs of nausea or vomiting Anesthetic complications: no    Last Vitals:  Vitals:   08/11/16 1324  Temp: 36.6 C    Last Pain:  Vitals:   08/11/16 1324  TempSrc: Oral                 Glennis Montenegro J

## 2016-08-11 NOTE — Op Note (Signed)
[facility] Patient Name: [name] Procedure Date: 08/11/2016 1:15 PM MRN: [id number] Date of Birth: 1946/08/02 Attending MD: Norvel Richards , MD CSN: [id number] Age: 70 Admit Type: Outpatient Procedure:                Upper GI endoscopy with [facility] dilation and                            gastric biopsy Indications:              Dysphagia Providers:                Norvel Richards, MD, Rosina Lowenstein, RN, Charlyne Petrin RN, RN, Randa Spike, Technician Referring MD:              Medicines:                Propofol per Anesthesia Complications:            No immediate complications. Estimated Blood Loss:     Estimated blood loss was minimal. Procedure:                Pre-Anesthesia Assessment:                           - Prior to the procedure, a History and Physical                            was performed, and patient medications and                            allergies were reviewed. The patient's tolerance of                            previous anesthesia was also reviewed. The risks                            and benefits of the procedure and the sedation                            options and risks were discussed with the patient.                            All questions were answered, and informed consent                            was obtained. Prior Anticoagulants: The patient has                            taken no previous anticoagulant or antiplatelet                            agents. ASA Grade Assessment: III - A patient with  severe systemic disease. After reviewing the risks                            and benefits, the patient was deemed in                            satisfactory condition to undergo the procedure.                           - Prior to the procedure, a History and Physical                            was performed, and patient medications and   allergies were reviewed. The patient's tolerance of                            previous anesthesia was also reviewed. The risks                            and benefits of the procedure and the sedation                            options and risks were discussed with the patient.                            All questions were answered, and informed consent                            was obtained. After reviewing the risks and                            benefits, the patient was deemed in satisfactory                            condition to undergo the procedure.                           After obtaining informed consent, the endoscope was                            passed under direct vision. Throughout the                            procedure, the patient's blood pressure, pulse, and                            oxygen saturations were monitored continuously. The                            [id number]) scope was introduced through the                            and advanced to the second part of duodenum. The  upper GI endoscopy was accomplished without                            difficulty. The patient tolerated the procedure                            well. Scope In: 2:02:01 PM Scope Out: 2:12:40 PM Total Procedure Duration: 0 hours 10 minutes 39 seconds  Findings:      A mild Schatzki ring (acquired) was found at the gastroesophageal       junction. No esophagitis and no Barrett's esophagus. The scope was       withdrawn. Dilation was performed with a Maloney dilator with no       resistance at 69 Fr. The scope was withdrawn. Dilation was performed       with a Maloney dilator with mild resistance at 56 Fr. The dilation site       was examined and showed mild improvement in luminal narrowing. Estimated       blood loss was minimal.      A small hiatal hernia was present. multiple gastric erosions present.       Mild pyloric stenosis. (1) 5 mm apical  pyloric channel ulcer Pyloric       channel dilated with scope passage.      The duodenal bulb and second portion of the duodenum were normal.       Estimated blood loss was minimal.      Abnormal gastric mucosa biopsied after dilation performed. Impression:               - Mild Schatzki ring. Dilated                           - Small hiatal hernia. Mild pyloric stenosis.                            Pyloric channel ulcer as described.-. gastric                            mucosa biopsy. Moderate Sedation:      Moderate (conscious) sedation was personally administered by an       anesthesia professional. The following parameters were monitored: oxygen       saturation, heart rate, blood pressure, respiratory rate, EKG, adequacy       of pulmonary ventilation, and response to care. Total physician       intraservice time was 16 minutes. Recommendation:           - Patient has a contact number available for                            emergencies. The signs and symptoms of potential                            delayed complications were discussed with the                            patient. Return to normal activities tomorrow.  Written discharge instructions were provided to the                            patient.                           - Resume previous diet.                           - Continue present medications. Increase Protonix                            to 40 mg twice daily. Stop taking                            aspirin-containing products. Follow up on patholog                           - Repeat upper endoscopy in 3 months for                            surveillance.                           - Return to GI clinic in 3 months. Procedure Code(s):        --- Professional ---                           [phone], Esophagogastroduodenoscopy, flexible,                            transoral; diagnostic, including collection of                            specimen(s)  by brushing or washing, when performed                            (separate procedure)                           43450, Dilation of esophagus, by unguided sound or                            bougie, single or multiple passes Diagnosis Code(s):        --- Professional ---                           K22.2, Esophageal obstruction                           K44.9, Diaphragmatic hernia without obstruction or                            gangrene                           R13.10, Dysphagia, unspecified CPT copyright 2016 American Medical Association. All  rights reserved. The codes documented in this report are preliminary and upon coder review may  be revised to meet current compliance requirements. Cristopher Estimable. Avea Mcgowen, MD Norvel Richards, MD 08/11/2016 2:29:47 PM This report has been signed electronically. Number of Addenda: 0

## 2016-08-11 NOTE — Interval H&P Note (Signed)
History and Physical Interval Note:  08/11/2016 1:25 PM  [name]  has presented today for surgery, with the diagnosis of dysphagia  The various methods of treatment have been discussed with the patient and family. After consideration of risks, benefits and other options for treatment, the patient has consented to  Procedure(s) with comments: ESOPHAGOGASTRODUODENOSCOPY (EGD) WITH PROPOFOL (N/A) - 2:15 PM MALONEY DILATION (N/A) as a surgical intervention .  The patient's history has been reviewed, patient examined, no change in status, stable for surgery.  I have reviewed the patient's chart and labs.  Questions were answered to the patient's satisfaction.     [name]  No change. EGD/ED per plan. The risks, benefits, limitations, alternatives and imponderables have been reviewed with the patient. Potential for esophageal dilation, biopsy, etc. have also been reviewed.  Questions have been answered. All parties agreeable.

## 2016-08-11 NOTE — Anesthesia Preprocedure Evaluation (Signed)
Anesthesia Evaluation  Patient identified by MRN, date of birth, ID band Patient awake    Reviewed: Allergy & Precautions, H&P , NPO status , Patient's Chart, lab work & pertinent test results  History of Anesthesia Complications (+) PONV and history of anesthetic complications  Airway Mallampati: II   Neck ROM: Full    Dental  (+) Edentulous Upper, Edentulous Lower   Pulmonary shortness of breath, COPD, Current Smoker,    breath sounds clear to auscultation+ rhonchi        Cardiovascular hypertension, Pt. on medications (-) angina Rhythm:Regular Rate:Normal     Neuro/Psych  Headaches, PSYCHIATRIC DISORDERS Anxiety Depression    GI/Hepatic PUD, GERD  Medicated and Controlled,  Endo/Other    Renal/GU      Musculoskeletal   Abdominal   Peds  Hematology   Anesthesia Other Findings   Reproductive/Obstetrics                             Anesthesia Physical Anesthesia Plan  ASA: III  Anesthesia Plan: MAC   Post-op Pain Management:    Induction: Intravenous  Airway Management Planned: Simple Face Mask  Additional Equipment:   Intra-op Plan:   Post-operative Plan:   Informed Consent: I have reviewed the patients History and Physical, chart, labs and discussed the procedure including the risks, benefits and alternatives for the proposed anesthesia with the patient or authorized representative who has indicated his/her understanding and acceptance.     Plan Discussed with:   Anesthesia Plan Comments:         Anesthesia Quick Evaluation

## 2016-08-17 ENCOUNTER — Encounter: Payer: Self-pay | Admitting: Internal Medicine

## 2016-08-17 ENCOUNTER — Encounter (HOSPITAL_COMMUNITY): Payer: Self-pay | Admitting: Internal Medicine

## 2016-09-07 ENCOUNTER — Other Ambulatory Visit: Payer: Self-pay | Admitting: Nurse Practitioner

## 2016-09-07 DIAGNOSIS — F419 Anxiety disorder, unspecified: Secondary | ICD-10-CM

## 2016-09-07 NOTE — Telephone Encounter (Signed)
Please review and advise Pt last seen 07/30/2016 for CPE

## 2016-09-08 NOTE — Telephone Encounter (Signed)
Called to pharmacy 

## 2016-09-08 NOTE — Telephone Encounter (Signed)
Please call in xanax with 1 refills 

## 2016-09-16 ENCOUNTER — Encounter (HOSPITAL_COMMUNITY): Payer: Self-pay | Admitting: Emergency Medicine

## 2016-09-16 ENCOUNTER — Emergency Department (HOSPITAL_COMMUNITY)
Admission: EM | Admit: 2016-09-16 | Discharge: 2016-09-16 | Disposition: A | Discharge status: Home / Self Care | Payer: Medicare Other | Attending: Emergency Medicine | Admitting: Emergency Medicine

## 2016-09-16 ENCOUNTER — Ambulatory Visit: Payer: Medicare Other | Admitting: Nurse Practitioner

## 2016-09-16 DIAGNOSIS — Z79899 Other long term (current) drug therapy: Secondary | ICD-10-CM | POA: Diagnosis not present

## 2016-09-16 DIAGNOSIS — J449 Chronic obstructive pulmonary disease, unspecified: Secondary | ICD-10-CM | POA: Insufficient documentation

## 2016-09-16 DIAGNOSIS — K625 Hemorrhage of anus and rectum: Secondary | ICD-10-CM | POA: Diagnosis not present

## 2016-09-16 DIAGNOSIS — F17210 Nicotine dependence, cigarettes, uncomplicated: Secondary | ICD-10-CM | POA: Insufficient documentation

## 2016-09-16 DIAGNOSIS — Z7982 Long term (current) use of aspirin: Secondary | ICD-10-CM | POA: Diagnosis not present

## 2016-09-16 DIAGNOSIS — I10 Essential (primary) hypertension: Secondary | ICD-10-CM | POA: Insufficient documentation

## 2016-09-16 LAB — BASIC METABOLIC PANEL
ANION GAP: 9 (ref 5–15)
BUN: 5 mg/dL — ABNORMAL LOW (ref 6–20)
CHLORIDE: 99 mmol/L — AB (ref 101–111)
CO2: 30 mmol/L (ref 22–32)
Calcium: 9.7 mg/dL (ref 8.9–10.3)
Creatinine, Ser: 0.61 mg/dL (ref 0.44–1.00)
GFR calc non Af Amer: >60 mL/min (ref >60)
Glucose, Bld: 92 mg/dL (ref 65–99)
POTASSIUM: 3.3 mmol/L — AB (ref 3.5–5.1)
SODIUM: 138 mmol/L (ref 135–145)

## 2016-09-16 LAB — CBC WITH DIFFERENTIAL/PLATELET
BASOS PCT: 1 %
Basophils Absolute: 0.1 10*3/uL (ref 0.0–0.1)
EOS ABS: 0.2 10*3/uL (ref 0.0–0.7)
Eosinophils Relative: 2 %
HCT: 44.9 % (ref 36.0–46.0)
HEMOGLOBIN: 14.6 g/dL (ref 12.0–15.0)
LYMPHS ABS: 3.2 10*3/uL (ref 0.7–4.0)
Lymphocytes Relative: 39 %
MCH: 29 pg (ref 26.0–34.0)
MCHC: 32.5 g/dL (ref 30.0–36.0)
MCV: 89.1 fL (ref 78.0–100.0)
Monocytes Absolute: 0.4 10*3/uL (ref 0.1–1.0)
Monocytes Relative: 5 %
NEUTROS PCT: 53 %
Neutro Abs: 4.2 10*3/uL (ref 1.7–7.7)
Platelets: 218 10*3/uL (ref 150–400)
RBC: 5.04 MIL/uL (ref 3.87–5.11)
RDW: 13.6 % (ref 11.5–15.5)
WBC: 8 10*3/uL (ref 4.0–10.5)

## 2016-09-16 LAB — TYPE AND SCREEN
ABO/RH(D): O POS
ANTIBODY SCREEN: NEGATIVE

## 2016-09-16 MED ORDER — HYDROCORTISONE ACETATE 25 MG RE SUPP: 25.0000 mg | Freq: Two times a day (BID) | RECTAL | 0 refills | Status: DC

## 2016-09-16 MED ORDER — SODIUM CHLORIDE 0.9 % IV BOLUS (SEPSIS)
1000.0000 mL | Freq: Once | INTRAVENOUS | Status: AC
  Administered 2016-09-16: 1000 mL via INTRAVENOUS

## 2016-09-16 NOTE — ED Provider Notes (Addendum)
[facility] DEPT Provider Note   CSN: [id number] Arrival date & time: 09/16/16  [id number]  By signing my name below, I, [name], attest that this documentation has been prepared under the direction and in the presence of No att. providers found. Electronically signed, [name], ED Scribe. 09/16/16. 10:52 AM.  History   Chief Complaint Chief Complaint  Patient presents with  . Rectal Bleeding    HPI HPI Comments: [name] is a 70 y.o. female, with hx of stomach ulcer, GERD, IBS, who presents to the Emergency Department s/p noticing bright red blood in stool at 0700 this morning. Pt reports 2 episodes of BRBPR this morning, approximately "an oz." in volume. Daughter states that the pt had an Endoscopy 3 weeks ago and has a stomach ulcer. She also had an Esophageal stricture and had stretching procedure performed. Pt reports Hx of similar symptoms approximately 6 years ago but did not have any major complications and was never specifically evaluated for the issue. She also complains of mild generalized abdominal pain currently. No nausea or vomiting.    The history is provided by the patient and a relative. No language interpreter was used.    Past Medical History:  Diagnosis Date  . Angina    with indigestion  . Anxiety   . Arthritis    all over esp. in joints  . Complication of anesthesia   . COPD (chronic obstructive pulmonary disease) ([place])   . Depression   . GERD (gastroesophageal reflux disease)   . Headache(784.0)    normal h/a  . HPV in female   . HTN (hypertension)   . Hypercholesteremia   . Hypercholesterolemia   . IBS (irritable bowel syndrome)   . Pneumonia 2007  . PONV (postoperative nausea and vomiting)   . S/P colonoscopy 05/30/2008   Dr. Deatra Ina: (records reviewed by AS, NP) findings normal to cecum, scattered diverticula in sigmoid colon   . S/P endoscopy 05/15/2008   Dr. Deatra Ina: (records reviewed by AS,NP): [facility] dilator for stricture distal  esophagus, also noted duodenal stricture, op notes state unable to pass 70mm scope through pylorus  . Shortness of breath    with exertion  . Stomach ulcer     Patient Active Problem List   Diagnosis Date Noted  . Smoker 07/28/2016  . BMI 32.0-32.9,adult 06/23/2015  . Depression 02/23/2015  . Hyperlipidemia with target LDL less than 100 02/23/2015  . COPD (chronic obstructive pulmonary disease) with chronic bronchitis ([place]) 11/26/2012  . Smokes tobacco daily 11/26/2012  . Anxiety 11/26/2012  . Hypokalemia 11/26/2012  . Benign hypertension 11/26/2012  . Chronic lower back pain 11/26/2012  . GERD (gastroesophageal reflux disease) 12/29/2011  . Pyloric stenosis, acquired 12/29/2011  . PUD (peptic ulcer disease) 11/09/2011  . Chronic diarrhea 11/09/2011  . Dysphagia 06/30/2011    Past Surgical History:  Procedure Laterality Date  . APPENDECTOMY     70 years old  . BACK SURGERY     X 2  . BIOPSY  08/11/2016   Procedure: BIOPSY;  Surgeon: Daneil Dolin, MD;  [facility]: [facility];  Service: Endoscopy;;  gastric bx's  . BREAST SURGERY    . CARPAL TUNNEL RELEASE     right hand  . CERVICAL BIOPSY  W/ LOOP ELECTRODE EXCISION  02/2011   CIN-2 with focal ectocervical margin involvement. With CIN-1 ECC negative  . CHOLECYSTECTOMY  1984  . ESOPHAGOGASTRODUODENOSCOPY  12/01/2011   Hiatal hernia/Mild fibrotic pyloric stenosis status post dilation with passage of  the scope.  The previously noted gastric ulcer completely healed  The remainder of the gastric mucosa appeared normal  . ESOPHAGOGASTRODUODENOSCOPY (EGD) WITH PROPOFOL N/A 08/11/2016   Procedure: ESOPHAGOGASTRODUODENOSCOPY (EGD) WITH PROPOFOL;  Surgeon: Daneil Dolin, MD;  Location: [facility];  Service: Endoscopy;  Laterality: N/A;  2:15 PM  . MALONEY DILATION  07/28/2011   Schatzki's ring s/p 75F with small UES tear as well, small hh, 1cm pyloric channel ulcer, bx benign without H.Pylori. Mobic/goody's at time.  Marland Kitchen MALONEY  DILATION N/A 08/11/2016   Procedure: Venia Minks DILATION;  Surgeon: Daneil Dolin, MD;  Location: [facility];  Service: Endoscopy;  Laterality: N/A;  . TUBAL LIGATION  1976  . tumor removed from arm      OB History    Gravida Para Term Preterm AB Living   4 4       5    SAB TAB Ectopic Multiple Live Births                   Home Medications    Prior to Admission medications   Medication Sig Start Date End Date Taking? Authorizing Provider  albuterol (PROVENTIL HFA;VENTOLIN HFA) 108 (90 Base) MCG/ACT inhaler Inhale 2 puffs into the lungs every 6 (six) hours as needed for wheezing or shortness of breath. 11/12/15  Yes Sharion Balloon, FNP  ALPRAZolam Duanne Moron) 1 MG tablet TAKE 1 TABLET 3 TIMES A DAY AS NEEDED FOR ANXIETY 09/08/16  Yes [name], FNP  calcium carbonate (OS-CAL) 600 MG TABS Take 600 mg by mouth daily.    Yes Historical Provider, MD  Cholecalciferol (VITAMIN D3) 1000 UNITS CAPS Take 3,000 mg by mouth daily.    Yes Historical Provider, MD  dicyclomine (BENTYL) 10 MG capsule Take 1 capsule (10 mg total) by mouth 4 (four) times daily as needed for spasms. Patient taking differently: Take 10 mg by mouth 4 (four) times daily as needed (for stomach spasms.).  04/08/16  Yes [name], FNP  diphenhydrAMINE (BENADRYL) 25 MG tablet Take 25 mg by mouth 2 (two) times daily as needed. For Allergies   Yes Historical Provider, MD  fluticasone (FLONASE) 50 MCG/ACT nasal spray Place 2 sprays into both nostrils daily. Patient taking differently: Place 2 sprays into both nostrils daily as needed (for allergies.).  11/12/15  Yes Sharion Balloon, FNP  HYDROcodone-acetaminophen (NORCO) 10-325 MG tablet Take 1 tablet by mouth every 8 (eight) hours as needed. For Pain 07/28/16  Yes [name], FNP  lisinopril-hydrochlorothiazide (PRINZIDE,ZESTORETIC) 10-12.5 MG tablet Take 1 tablet by mouth daily. 04/08/16  Yes [name], FNP  Multiple Vitamin (MULITIVITAMIN WITH  MINERALS) TABS Take 1 tablet by mouth daily.   Yes Historical Provider, MD  pantoprazole (PROTONIX) 40 MG tablet Take 1 tablet (40 mg total) by mouth daily. Patient taking differently: Take 40 mg by mouth 2 (two) times daily.  07/28/16  Yes [name], FNP  PARoxetine (PAXIL) 20 MG tablet Take 1 tablet (20 mg total) by mouth daily. 07/28/16  Yes [name], FNP  simvastatin (ZOCOR) 40 MG tablet TAKE 1 TABLET BY MOUTH AT BEEDTIME 07/28/16  Yes [name], FNP  vitamin C (ASCORBIC ACID) 500 MG tablet Take 1,000 mg by mouth daily.    Yes Historical Provider, MD  Aspirin-Acetaminophen-Caffeine (GOODYS EXTRA STRENGTH) [id number] MG PACK Take 1 packet by mouth every 8 (eight) hours as needed (for pain.).    Historical Provider, MD  hydrocortisone (ANUSOL-HC) 25 MG suppository Place 1 suppository (25 mg  total) rectally 2 (two) times daily. 09/16/16   Nat Christen, MD    Family History Family History  Problem Relation Age of Onset  . Heart disease Father     CHF  . Diabetes Brother     type 2  . Colon cancer Neg Hx   . Anesthesia problems Neg Hx   . Hypotension Neg Hx   . Malignant hyperthermia Neg Hx   . Pseudochol deficiency Neg Hx   . Colon polyps Neg Hx     Social History Social History  Substance Use Topics  . Smoking status: Current Every Day Smoker    Packs/day: 1.00    Years: 50.00    Types: Cigarettes  . Smokeless tobacco: Never Used     Comment: since teenager  . Alcohol use No     Comment: hx of ETOH abuse in past, none in 10 years    Allergies   Meloxicam   Review of Systems Review of Systems  A complete 10 system review of systems was obtained and all systems are negative except as noted in the HPI and PMH.    Physical Exam Updated Vital Signs BP 160/72 (BP Location: Left Arm)   Pulse 67   Temp 98.6 F (37 C) (Oral)   Resp 11   SpO2 92%   Physical Exam  Constitutional: She is oriented to person, place, and time. She appears  well-developed and well-nourished.  HENT:  Head: Normocephalic and atraumatic.  Eyes: Conjunctivae are normal.  Neck: Neck supple.  Cardiovascular: Normal rate and regular rhythm.   Pulmonary/Chest: Effort normal and breath sounds normal.  Abdominal: Soft. Bowel sounds are normal. There is tenderness.  Minimal mid-lower abdominal tenderness  Genitourinary:  Genitourinary Comments: No gross blood. No masses hem negative  Musculoskeletal: Normal range of motion.  Neurological: She is alert and oriented to person, place, and time.  Skin: Skin is warm and dry.  Psychiatric: She has a normal mood and affect. Her behavior is normal.  Nursing note and vitals reviewed.    ED Treatments / Results  DIAGNOSTIC STUDIES: Oxygen Saturation is 97% on RA, normal by my interpretation.  COORDINATION OF CARE: 10:50 AM-Will order blood work and wait for results. Discussed treatment plan with pt at bedside and pt agreed to plan.   Labs (all labs ordered are listed, but only abnormal results are displayed) Labs Reviewed  BASIC METABOLIC PANEL - Abnormal; Notable for the following:       Result Value   Potassium 3.3 (*)    Chloride 99 (*)    BUN 5 (*)    All other components within normal limits  CBC WITH DIFFERENTIAL/PLATELET  TYPE AND SCREEN    EKG  EKG Interpretation  Date/Time:  Friday September 16 2016 11:54:28 EST Ventricular Rate:  61 PR Interval:    QRS Duration: 104 QT Interval:  459 QTC Calculation: 463 R Axis:   -32 Text Interpretation:  Sinus rhythm Prolonged PR interval Left axis deviation Abnormal R-wave progression, early transition Consider anterior infarct Baseline wander in lead(s) V5 Confirmed by Lacinda Axon  MD, Lugenia Assefa ([id number]) on 09/16/2016 12:58:43 PM       Radiology No results found.  Procedures Procedures (including critical care time)  Medications Ordered in ED Medications  sodium chloride 0.9 % bolus 1,000 mL (0 mLs Intravenous Stopped 09/16/16 1259)      Initial Impression / Assessment and Plan / ED Course  I have reviewed the triage vital signs and the nursing notes.  Pertinent labs & imaging results that were available during my care of the patient were reviewed by me and considered in my medical decision making (see chart for details).  Clinical Course     Rectal exam revealed no masses/blood/stool on the glove. Patient is hemodynamically stable. Rx Anusol suppository. Follow-up with gastroenterology or return if worse.  Final Clinical Impressions(s) / ED Diagnoses   Final diagnoses:  Rectal bleeding    New Prescriptions Discharge Medication List as of 09/16/2016  2:18 PM    START taking these medications   Details  hydrocortisone (ANUSOL-HC) 25 MG suppository Place 1 suppository (25 mg total) rectally 2 (two) times daily., Starting Fri 09/16/2016, Print      I personally performed the services described in this documentation, which was scribed in my presence. The recorded information has been reviewed and is accurate.      Nat Christen, MD 09/16/16 [place], MD 09/17/16 2012

## 2016-09-16 NOTE — Discharge Instructions (Signed)
Blood count is stable.  Prescription for suppository. Return if worse or follow-up with your gastroenterologist

## 2016-09-16 NOTE — ED Triage Notes (Signed)
Notice first bloody stool at 0700, bright red blood.  Two hour later and has blood and stool mixed.  Pt says she has IBS.  Seen by Dr Aurea Graff 3 weeks for Endoscopy.

## 2016-10-06 ENCOUNTER — Other Ambulatory Visit: Payer: Self-pay | Admitting: Family Medicine

## 2016-10-06 ENCOUNTER — Other Ambulatory Visit: Payer: Self-pay | Admitting: Family

## 2016-10-06 DIAGNOSIS — J209 Acute bronchitis, unspecified: Secondary | ICD-10-CM

## 2016-10-06 DIAGNOSIS — J441 Chronic obstructive pulmonary disease with (acute) exacerbation: Secondary | ICD-10-CM

## 2016-10-16 ENCOUNTER — Emergency Department (HOSPITAL_COMMUNITY): Payer: Medicare Other

## 2016-10-16 ENCOUNTER — Encounter (HOSPITAL_COMMUNITY): Payer: Self-pay | Admitting: Emergency Medicine

## 2016-10-16 ENCOUNTER — Emergency Department (HOSPITAL_COMMUNITY)
Admission: EM | Admit: 2016-10-16 | Discharge: 2016-10-16 | Disposition: A | Discharge status: Home / Self Care | Payer: Medicare Other | Attending: Emergency Medicine | Admitting: Emergency Medicine

## 2016-10-16 DIAGNOSIS — J449 Chronic obstructive pulmonary disease, unspecified: Secondary | ICD-10-CM | POA: Diagnosis not present

## 2016-10-16 DIAGNOSIS — I10 Essential (primary) hypertension: Secondary | ICD-10-CM | POA: Diagnosis not present

## 2016-10-16 DIAGNOSIS — Z79899 Other long term (current) drug therapy: Secondary | ICD-10-CM | POA: Diagnosis not present

## 2016-10-16 DIAGNOSIS — Y999 Unspecified external cause status: Secondary | ICD-10-CM | POA: Insufficient documentation

## 2016-10-16 DIAGNOSIS — S82042A Displaced comminuted fracture of left patella, initial encounter for closed fracture: Secondary | ICD-10-CM | POA: Insufficient documentation

## 2016-10-16 DIAGNOSIS — Y92512 Supermarket, store or market as the place of occurrence of the external cause: Secondary | ICD-10-CM | POA: Insufficient documentation

## 2016-10-16 DIAGNOSIS — F17210 Nicotine dependence, cigarettes, uncomplicated: Secondary | ICD-10-CM | POA: Insufficient documentation

## 2016-10-16 DIAGNOSIS — S8992XA Unspecified injury of left lower leg, initial encounter: Secondary | ICD-10-CM | POA: Diagnosis present

## 2016-10-16 DIAGNOSIS — Y9389 Activity, other specified: Secondary | ICD-10-CM | POA: Insufficient documentation

## 2016-10-16 DIAGNOSIS — W109XXA Fall (on) (from) unspecified stairs and steps, initial encounter: Secondary | ICD-10-CM | POA: Insufficient documentation

## 2016-10-16 MED ORDER — HYDROCODONE-ACETAMINOPHEN 5-325 MG PO TABS
2.0000 | ORAL_TABLET | Freq: Once | ORAL | Status: AC
  Administered 2016-10-16: 2 via ORAL
  Filled 2016-10-16: qty 2

## 2016-10-16 MED ORDER — OXYCODONE HCL 5 MG PO TABS: 5.0000 mg | ORAL_TABLET | ORAL | 0 refills | Status: DC | PRN

## 2016-10-16 NOTE — ED Provider Notes (Signed)
[facility] DEPT Provider Note   CSN: [id number] Arrival date & time: 10/16/16  1534   By signing my name below, I, Collene Leyden, attest that this documentation has been prepared under the direction and in the presence of Evalee Jefferson, PA-C Electronically Signed: Collene Leyden, Scribe. 10/16/16. 5:02 PM.  History   Chief Complaint Chief Complaint  Patient presents with  . Fall    HPI Comments: [name] is a 71 y.o. female with a hx of COPD, GERD, HTN, and HLD,  who presents to the Emergency Department complaining of a sudden-onset left knee pain s/p a mechanical fall just prior to arrival here. Patient states she was coming out of a store and she missed a step and fell on both of her hands and knees. Patient has no other associated symptoms including headache, neck pain or head injury. Patient states she is unable to bear any weight on her left knee. Patient reports taking hydrocodone at about 1PM which she takes twice daily for her chronic low back pain. No modifying factors indicated. She denies persistent pain in her hands, wrists and right knee.  She has had no treatment prior to arrival for this injury.   The history is provided by the patient. No language interpreter was used.    Past Medical History:  Diagnosis Date  . Angina    with indigestion  . Anxiety   . Arthritis    all over esp. in joints  . Complication of anesthesia   . COPD (chronic obstructive pulmonary disease) ([place])   . Depression   . GERD (gastroesophageal reflux disease)   . Headache(784.0)    normal h/a  . HPV in female   . HTN (hypertension)   . Hypercholesteremia   . Hypercholesterolemia   . IBS (irritable bowel syndrome)   . Pneumonia 2007  . PONV (postoperative nausea and vomiting)   . S/P colonoscopy 05/30/2008   Dr. Deatra Ina: (records reviewed by AS, NP) findings normal to cecum, scattered diverticula in sigmoid colon   . S/P endoscopy 05/15/2008   Dr. Deatra Ina: (records reviewed by  AS,NP): [facility] dilator for stricture distal esophagus, also noted duodenal stricture, op notes state unable to pass 64mm scope through pylorus  . Shortness of breath    with exertion  . Stomach ulcer     Patient Active Problem List   Diagnosis Date Noted  . Smoker 07/28/2016  . BMI 32.0-32.9,adult 06/23/2015  . Depression 02/23/2015  . Hyperlipidemia with target LDL less than 100 02/23/2015  . COPD (chronic obstructive pulmonary disease) with chronic bronchitis ([place]) 11/26/2012  . Smokes tobacco daily 11/26/2012  . Anxiety 11/26/2012  . Hypokalemia 11/26/2012  . Benign hypertension 11/26/2012  . Chronic lower back pain 11/26/2012  . GERD (gastroesophageal reflux disease) 12/29/2011  . Pyloric stenosis, acquired 12/29/2011  . PUD (peptic ulcer disease) 11/09/2011  . Chronic diarrhea 11/09/2011  . Dysphagia 06/30/2011    Past Surgical History:  Procedure Laterality Date  . APPENDECTOMY     71 years old  . BACK SURGERY     X 2  . BIOPSY  08/11/2016   Procedure: BIOPSY;  Surgeon: Daneil Dolin, MD;  [facility];  Service: Endoscopy;;  gastric bx's  . BREAST SURGERY    . CARPAL TUNNEL RELEASE     right hand  . CERVICAL BIOPSY  W/ LOOP ELECTRODE EXCISION  02/2011   CIN-2 with focal ectocervical margin involvement. With CIN-1 ECC negative  . CHOLECYSTECTOMY  1984  .  ESOPHAGOGASTRODUODENOSCOPY  12/01/2011   Hiatal hernia/Mild fibrotic pyloric stenosis status post dilation with passage of  the scope.  The previously noted gastric ulcer completely healed  The remainder of the gastric mucosa appeared normal  . ESOPHAGOGASTRODUODENOSCOPY (EGD) WITH PROPOFOL N/A 08/11/2016   Procedure: ESOPHAGOGASTRODUODENOSCOPY (EGD) WITH PROPOFOL;  Surgeon: Daneil Dolin, MD;  [facility];  Service: Endoscopy;  Laterality: N/A;  2:15 PM  . MALONEY DILATION  07/28/2011   Schatzki's ring s/p 28F with small UES tear as well, small hh, 1cm pyloric channel ulcer, bx benign without  H.Pylori. Mobic/goody's at time.  Marland Kitchen MALONEY DILATION N/A 08/11/2016   Procedure: Venia Minks DILATION;  Surgeon: Daneil Dolin, MD;  [facility];  Service: Endoscopy;  Laterality: N/A;  . TUBAL LIGATION  1976  . tumor removed from arm      OB History    Gravida Para Term Preterm AB Living   4 4       5    SAB TAB Ectopic Multiple Live Births                   Home Medications    Prior to Admission medications   Medication Sig Start Date End Date Taking? Authorizing Provider  ALPRAZolam Duanne Moron) 1 MG tablet TAKE 1 TABLET 3 TIMES A DAY AS NEEDED FOR ANXIETY 09/08/16   [name], FNP  Aspirin-Acetaminophen-Caffeine (GOODYS EXTRA STRENGTH) [phone] MG PACK Take 1 packet by mouth every 8 (eight) hours as needed (for pain.).    Historical Provider, MD  calcium carbonate (OS-CAL) 600 MG TABS Take 600 mg by mouth daily.     Historical Provider, MD  Cholecalciferol (VITAMIN D3) 1000 UNITS CAPS Take 3,000 mg by mouth daily.     Historical Provider, MD  dicyclomine (BENTYL) 10 MG capsule Take 1 capsule (10 mg total) by mouth 4 (four) times daily as needed for spasms. Patient taking differently: Take 10 mg by mouth 4 (four) times daily as needed (for stomach spasms.).  04/08/16   [name], FNP  diphenhydrAMINE (BENADRYL) 25 MG tablet Take 25 mg by mouth 2 (two) times daily as needed. For Allergies    Historical Provider, MD  fluticasone (FLONASE) 50 MCG/ACT nasal spray PLACE 2 SPRAYS INTO BOTH NOSTRILS DAILY. 10/06/16   [name], FNP  HYDROcodone-acetaminophen (NORCO) 10-325 MG tablet Take 1 tablet by mouth every 8 (eight) hours as needed. For Pain 07/28/16   [name], FNP  hydrocortisone (ANUSOL-HC) 25 MG suppository Place 1 suppository (25 mg total) rectally 2 (two) times daily. 09/16/16   Nat Christen, MD  lisinopril-hydrochlorothiazide (PRINZIDE,ZESTORETIC) 10-12.5 MG tablet Take 1 tablet by mouth daily. 04/08/16   [name], FNP    Multiple Vitamin (MULITIVITAMIN WITH MINERALS) TABS Take 1 tablet by mouth daily.    Historical Provider, MD  pantoprazole (PROTONIX) 40 MG tablet Take 1 tablet (40 mg total) by mouth daily. Patient taking differently: Take 40 mg by mouth 2 (two) times daily.  07/28/16   [name], FNP  PARoxetine (PAXIL) 20 MG tablet Take 1 tablet (20 mg total) by mouth daily. 07/28/16   [name], FNP  PROAIR HFA 108 (90 Base) MCG/ACT inhaler INHALE 2 PUFFS INTO THE LUNGS EVERY 6 (SIX) HOURS AS NEEDED FOR WHEEZING OR SHORTNESS OF BREATH. 10/06/16   [name], FNP  simvastatin (ZOCOR) 40 MG tablet TAKE 1 TABLET BY MOUTH AT [facility] 07/28/16   [name], FNP  vitamin C (ASCORBIC ACID) 500 MG tablet Take 1,000  mg by mouth daily.     Historical Provider, MD    Family History Family History  Problem Relation Age of Onset  . Heart disease Father     CHF  . Diabetes Brother     type 2  . Colon cancer Neg Hx   . Anesthesia problems Neg Hx   . Hypotension Neg Hx   . Malignant hyperthermia Neg Hx   . Pseudochol deficiency Neg Hx   . Colon polyps Neg Hx     Social History Social History  Substance Use Topics  . Smoking status: Current Every Day Smoker    Packs/day: 1.00    Years: 50.00    Types: Cigarettes  . Smokeless tobacco: Never Used     Comment: since teenager  . Alcohol use No     Comment: hx of ETOH abuse in past, none in 10 years     Allergies   Meloxicam   Review of Systems Review of Systems  Constitutional: Negative for fever.  Musculoskeletal: Positive for arthralgias and joint swelling. Negative for myalgias.  Skin: Positive for color change and wound.  Neurological: Negative for weakness and numbness.     Physical Exam Updated Vital Signs BP 142/90 (BP Location: Left Arm)   Pulse 70   Temp 98.1 F (36.7 C) (Oral)   Resp 22   Ht 5\' 8"  (1.727 m)   Wt 84.4 kg   SpO2 94%   BMI 28.28 kg/m   Physical Exam  Constitutional: She  is oriented to person, place, and time. She appears well-developed and well-nourished.  HENT:  Head: Normocephalic and atraumatic.  Eyes: EOM are normal. Pupils are equal, round, and reactive to light.  Neck: Normal range of motion. Neck supple.  Cardiovascular: Normal rate and regular rhythm.   Pulmonary/Chest: Effort normal and breath sounds normal.  Musculoskeletal: Normal range of motion.  She has edema and deformity of left anterior knee with a small hemostatic abrasion and moderate bruising.  Tender to palpation.  Her right knee has modest bruising with full range of motion without discomfort. No deformity.  Small abrasion left thenar eminence. Patient has full range of motion of wrists and fingers without pain.  She has no upper extremity deformity.   Neurological: She is alert and oriented to person, place, and time.  Skin: Skin is warm and dry.  Psychiatric: She has a normal mood and affect.  Nursing note and vitals reviewed.    ED Treatments / Results  DIAGNOSTIC STUDIES: Oxygen Saturation is 94% on RA, adequate by my interpretation.    COORDINATION OF CARE: 4:48 PM Discussed treatment plan with pt at bedside and pt agreed to plan.  Labs (all labs ordered are listed, but only abnormal results are displayed) Labs Reviewed - No data to display  EKG  EKG Interpretation None       Radiology Dg Knee Complete 4 Views Left  Result Date: 10/16/2016 CLINICAL DATA:  [place] today coming out of a store, missed a step down and fell on pavement/cement, pain left anterior knee EXAM: LEFT KNEE - COMPLETE 4+ VIEW COMPARISON:  None. FINDINGS: There is a transverse, mildly comminuted, displaced fracture of the patella. The primary superior and inferior patellar fracture components are separated by 18 mm. No other fractures. There is medial joint space compartment narrowing with small marginal osteophytes. Bones are diffusely demineralized. There is a small joint effusion. IMPRESSION: 1.  Displaced, comminuted patella fracture, which is primarily transverse. 2. No other fractures or acute  findings. Electronically Signed   By: Lajean Manes M.D.   On: 10/16/2016 16:54    Procedures Procedures (including critical care time)  Medications Ordered in ED Medications  HYDROcodone-acetaminophen (NORCO/VICODIN) 5-325 MG per tablet 2 tablet (2 tablets Oral Given 10/16/16 1720)     Initial Impression / Assessment and Plan / ED Course  I have reviewed the triage vital signs and the nursing notes.  Pertinent labs & imaging results that were available during my care of the patient were reviewed by me and considered in my medical decision making (see chart for details).  Clinical Course    Patients pain was treated with hydrocodone, abrasions were cleaned and dressed. Knee mobilizer applied. Patient defers crutches but has a walker at home. She also asked for a wheel chair prescription to use at home. Has been seen by Dr. Luna Glasgow in the past. Will refer to this group although patient is aware this is a surgical procedure and may need Dr. Alvan Dame. Referrals  were given for both orthopedists.   Discussed with Dr. Sabra Heck who also saw pt prior to dc home.  Final Clinical Impressions(s) / ED Diagnoses   Final diagnoses:  Closed displaced comminuted fracture of left patella, initial encounter    New Prescriptions New Prescriptions   OXYCODONE (ROXICODONE) 5 MG IMMEDIATE RELEASE TABLET    Take 1 tablet (5 mg total) by mouth every 4 (four) hours as needed for severe pain.   I personally performed the services described in this documentation, which was scribed in my presence. The recorded information has been reviewed and is accurate.     Evalee Jefferson, PA-C 10/16/16 [place], MD 10/18/16 [phone]

## 2016-10-16 NOTE — ED Triage Notes (Signed)
Patient c/o left knee pain after falling today. Per patient fell because she missed a step coming out of store. Per patient landed on both knees and hands. Patient states unable to bear weight on left knee. Denies hitting head.

## 2016-10-16 NOTE — Discharge Instructions (Signed)
Wear your knee immobilizer at all times.  Ice and elevation can help with pain. Use your walker at home or the wheelchair which has been prescribed you.  You may take the oxycodone between your doses of hydrocodone if needed for increased pain relief. Do not drive within 4 hours of taking oxycodone as this will make you drowsy.

## 2016-10-17 ENCOUNTER — Other Ambulatory Visit: Payer: Self-pay | Admitting: Nurse Practitioner

## 2016-10-17 ENCOUNTER — Ambulatory Visit (INDEPENDENT_AMBULATORY_CARE_PROVIDER_SITE_OTHER): Payer: Medicare Other | Admitting: Orthopedic Surgery

## 2016-10-17 ENCOUNTER — Encounter: Payer: Self-pay | Admitting: Orthopedic Surgery

## 2016-10-17 VITALS — BP 83/47 | HR 75 | Resp 18

## 2016-10-17 DIAGNOSIS — S82042A Displaced comminuted fracture of left patella, initial encounter for closed fracture: Secondary | ICD-10-CM

## 2016-10-17 DIAGNOSIS — M25571 Pain in right ankle and joints of right foot: Secondary | ICD-10-CM | POA: Diagnosis not present

## 2016-10-17 DIAGNOSIS — S82045A Nondisplaced comminuted fracture of left patella, initial encounter for closed fracture: Secondary | ICD-10-CM

## 2016-10-17 NOTE — Progress Notes (Signed)
HISTORY  Chief Complaint  Patient presents with  . Follow-up    LEFT KNEE FRACTURE, DOI 10/16/16    HPI [name] is a 71 y.o. female.  Presents for evaluation and treatment of left patella fracture The patient fell at a local convenience store and [name] went to the ER for evaluation and presents for treatment of her patella fracture  Location pain left knee Quality dull ache Severity moderate Duration one day Timing worse with movement    Review of Systems Review of Systems  Respiratory: Negative for shortness of breath.   Cardiovascular: Negative for chest pain.  All other systems reviewed and are negative.   Past Medical History:  Diagnosis Date  . Angina    with indigestion  . Anxiety   . Arthritis    all over esp. in joints  . Complication of anesthesia   . COPD (chronic obstructive pulmonary disease) ([place])   . Depression   . GERD (gastroesophageal reflux disease)   . Headache(784.0)    normal h/a  . HPV in female   . HTN (hypertension)   . Hypercholesteremia   . Hypercholesterolemia   . IBS (irritable bowel syndrome)   . Pneumonia 2007  . PONV (postoperative nausea and vomiting)   . S/P colonoscopy 05/30/2008   Dr. Deatra Ina: (records reviewed by AS, NP) findings normal to cecum, scattered diverticula in sigmoid colon   . S/P endoscopy 05/15/2008   Dr. Deatra Ina: (records reviewed by AS,NP): [facility] dilator for stricture distal esophagus, also noted duodenal stricture, op notes state unable to pass 108mm scope through pylorus  . Shortness of breath    with exertion  . Stomach ulcer     Past Surgical History:  Procedure Laterality Date  . APPENDECTOMY     71 years old  . BACK SURGERY     X 2  . BIOPSY  08/11/2016   Procedure: BIOPSY;  Surgeon: Daneil Dolin, MD;  [facility];  Service: Endoscopy;;  gastric bx's  . BREAST SURGERY    . CARPAL TUNNEL RELEASE     right hand  . CERVICAL BIOPSY  W/ LOOP ELECTRODE EXCISION  02/2011   CIN-2 with focal ectocervical margin involvement. With CIN-1 ECC negative  . CHOLECYSTECTOMY  1984  . ESOPHAGOGASTRODUODENOSCOPY  12/01/2011   Hiatal hernia/Mild fibrotic pyloric stenosis status post dilation with passage of  the scope.  The previously noted gastric ulcer completely healed  The remainder of the gastric mucosa appeared normal  . ESOPHAGOGASTRODUODENOSCOPY (EGD) WITH PROPOFOL N/A 08/11/2016   Procedure: ESOPHAGOGASTRODUODENOSCOPY (EGD) WITH PROPOFOL;  Surgeon: Daneil Dolin, MD;  [facility];  Service: Endoscopy;  Laterality: N/A;  2:15 PM  . MALONEY DILATION  07/28/2011   Schatzki's ring s/p 85F with small UES tear as well, small hh, 1cm pyloric channel ulcer, bx benign without H.Pylori. Mobic/goody's at time.  Marland Kitchen MALONEY DILATION N/A 08/11/2016   Procedure: Venia Minks DILATION;  Surgeon: Daneil Dolin, MD;  [facility];  Service: Endoscopy;  Laterality: N/A;  . TUBAL LIGATION  1976  . tumor removed from arm       Family History  Problem Relation Age of Onset  . Heart disease Father     CHF  . Diabetes Brother     type 2  . Colon cancer Neg Hx   . Anesthesia problems Neg Hx   . Hypotension Neg Hx   . Malignant hyperthermia Neg Hx   . Pseudochol deficiency Neg Hx   .  Colon polyps Neg Hx    The patient was quizzed about their family history and reported no history of bleeding problems or anesthesia problems in their family  Social History  Substance Use Topics  . Smoking status: Current Every Day Smoker    Packs/day: 1.00    Years: 50.00    Types: Cigarettes  . Smokeless tobacco: Never Used     Comment: since teenager  . Alcohol use No     Comment: hx of ETOH abuse in past, none in 10 years    Allergies  Allergen Reactions  . Meloxicam Other (See Comments)    Stomach ulcers    Current Meds  Medication Sig  . ALPRAZolam (XANAX) 1 MG tablet TAKE 1 TABLET 3 TIMES A DAY AS NEEDED FOR ANXIETY  . Aspirin-Acetaminophen-Caffeine (GOODYS  EXTRA STRENGTH) 500-325-65 MG PACK Take 1 packet by mouth every 8 (eight) hours as needed (for pain.).  Marland Kitchen calcium carbonate (OS-CAL) 600 MG TABS Take 600 mg by mouth daily.   . Cholecalciferol (VITAMIN D3) 1000 UNITS CAPS Take 3,000 mg by mouth daily.   Marland Kitchen dicyclomine (BENTYL) 10 MG capsule Take 1 capsule (10 mg total) by mouth 4 (four) times daily as needed for spasms. (Patient taking differently: Take 10 mg by mouth 4 (four) times daily as needed (for stomach spasms.). )  . diphenhydrAMINE (BENADRYL) 25 MG tablet Take 25 mg by mouth 2 (two) times daily as needed. For Allergies  . fluticasone (FLONASE) 50 MCG/ACT nasal spray PLACE 2 SPRAYS INTO BOTH NOSTRILS DAILY.  Marland Kitchen HYDROcodone-acetaminophen (NORCO) 10-325 MG tablet Take 1 tablet by mouth every 8 (eight) hours as needed. For Pain  . hydrocortisone (ANUSOL-HC) 25 MG suppository Place 1 suppository (25 mg total) rectally 2 (two) times daily.  Marland Kitchen lisinopril-hydrochlorothiazide (PRINZIDE,ZESTORETIC) 10-12.5 MG tablet Take 1 tablet by mouth daily.  . Multiple Vitamin (MULITIVITAMIN WITH MINERALS) TABS Take 1 tablet by mouth daily.  Marland Kitchen oxyCODONE (ROXICODONE) 5 MG immediate release tablet Take 1 tablet (5 mg total) by mouth every 4 (four) hours as needed for severe pain.  . pantoprazole (PROTONIX) 40 MG tablet Take 1 tablet (40 mg total) by mouth daily. (Patient taking differently: Take 40 mg by mouth 2 (two) times daily. )  . PARoxetine (PAXIL) 20 MG tablet Take 1 tablet (20 mg total) by mouth daily.  Marland Kitchen PROAIR HFA 108 (90 Base) MCG/ACT inhaler INHALE 2 PUFFS INTO THE LUNGS EVERY 6 (SIX) HOURS AS NEEDED FOR WHEEZING OR SHORTNESS OF BREATH.  . simvastatin (ZOCOR) 40 MG tablet TAKE 1 TABLET BY MOUTH AT BEEDTIME  . vitamin C (ASCORBIC ACID) 500 MG tablet Take 1,000 mg by mouth daily.     PHYSICAL EXAM   CONSTITUTIONAL:  BP (!) 83/47   Pulse 75   Resp 18  Development grooming hygiene normal. Body habitus: MESOMORPHIC  EYES The conjunctiva show no  injection or irritation in the eyelids are normal Pupils and irises are reactive to light and accommodation with normal size and symmetry  EARS   External inspection of ears and nose show no evidence of scars lesions or  masses in canal externally is clear  Hearing is normal to whispered voice and normal voice   NECK  no masses tracheal position is midline thyroid is not enlarged  RESPIRATORY   effort is normal and chest is non-tender  CARDIOVASCULAR   Heart location and size is normal without thrills and peripheral pulses are    normal with no peripheral edema or significant varicosities  LYMPH NODES   neck normal   supraclavicular area normal    groin normal   MUSCULOSKELETAL  The patient's a right and left upper extremity looked normal on inspection and no tenderness to palpation. Full range of motion is exhibited with no joint subluxation dislocation atrophy or tremor. There are no skin lesions and normal radial ulnar pulses are noted with normal epitrochlear lymph nodes normal sensation and normal coordination. No pathologic reflexes are observed.  Gait and station: No ability to ambulate on the left leg  SKIN:  Skin is normal in the extremities except for the left knee which has an abrasion over the front of the knee   No induration or nodularity  NEUROLOGIC:   DTR's were normal in the upper extremities and right lower extremity not tested left lower extremity due to knee fracture  sensation is normal in the in all 4 extremities   PSYCHIATRIC:   Judgment and insight normal  Orientation time person and place normal  Mood affect normal  MEDICAL DECISION MAKING   DIAGNOSIS Left patella fracture  IMAGING  X-rays left knee show left patella fracture significantly displaced  PLAN Recommend open treatment internal fixation left knee  This procedure has been fully reviewed with the patient and written informed consent has been obtained.  Most common complication  of hardware removal 2 years down the line discussed

## 2016-10-18 ENCOUNTER — Encounter (HOSPITAL_COMMUNITY): Payer: Self-pay

## 2016-10-18 ENCOUNTER — Encounter: Payer: Self-pay | Admitting: *Deleted

## 2016-10-18 ENCOUNTER — Encounter (HOSPITAL_COMMUNITY)
Admission: RE | Admit: 2016-10-18 | Discharge: 2016-10-18 | Disposition: A | Discharge status: Home / Self Care | Payer: Medicare Other | Source: Ambulatory Visit | Attending: Orthopedic Surgery | Admitting: Orthopedic Surgery

## 2016-10-18 HISTORY — PX: FRACTURE SURGERY: SHX138

## 2016-10-18 HISTORY — DX: Family history of other specified conditions: Z84.89

## 2016-10-19 ENCOUNTER — Encounter (HOSPITAL_COMMUNITY)
Admission: RE | Disposition: A | Discharge status: Home / Self Care | Payer: Self-pay | Source: Ambulatory Visit | Attending: Orthopedic Surgery

## 2016-10-19 ENCOUNTER — Encounter (HOSPITAL_COMMUNITY): Payer: Self-pay | Admitting: *Deleted

## 2016-10-19 ENCOUNTER — Ambulatory Visit (HOSPITAL_COMMUNITY): Payer: Medicare Other | Admitting: Anesthesiology

## 2016-10-19 ENCOUNTER — Observation Stay (HOSPITAL_COMMUNITY)
Admission: RE | Admit: 2016-10-19 | Discharge: 2016-10-20 | Disposition: A | Discharge status: Home / Self Care | Payer: Medicare Other | Source: Ambulatory Visit | Attending: Orthopedic Surgery | Admitting: Orthopedic Surgery

## 2016-10-19 ENCOUNTER — Ambulatory Visit (HOSPITAL_COMMUNITY): Payer: Medicare Other

## 2016-10-19 DIAGNOSIS — K589 Irritable bowel syndrome without diarrhea: Secondary | ICD-10-CM | POA: Diagnosis not present

## 2016-10-19 DIAGNOSIS — S82002A Unspecified fracture of left patella, initial encounter for closed fracture: Secondary | ICD-10-CM

## 2016-10-19 DIAGNOSIS — Y998 Other external cause status: Secondary | ICD-10-CM | POA: Diagnosis not present

## 2016-10-19 DIAGNOSIS — W19XXXA Unspecified fall, initial encounter: Secondary | ICD-10-CM | POA: Diagnosis not present

## 2016-10-19 DIAGNOSIS — J449 Chronic obstructive pulmonary disease, unspecified: Secondary | ICD-10-CM | POA: Insufficient documentation

## 2016-10-19 DIAGNOSIS — F419 Anxiety disorder, unspecified: Secondary | ICD-10-CM | POA: Diagnosis not present

## 2016-10-19 DIAGNOSIS — I10 Essential (primary) hypertension: Secondary | ICD-10-CM | POA: Diagnosis not present

## 2016-10-19 DIAGNOSIS — E7800 Pure hypercholesterolemia, unspecified: Secondary | ICD-10-CM | POA: Diagnosis not present

## 2016-10-19 DIAGNOSIS — Z8719 Personal history of other diseases of the digestive system: Secondary | ICD-10-CM | POA: Diagnosis not present

## 2016-10-19 DIAGNOSIS — F329 Major depressive disorder, single episode, unspecified: Secondary | ICD-10-CM | POA: Insufficient documentation

## 2016-10-19 DIAGNOSIS — Y92512 Supermarket, store or market as the place of occurrence of the external cause: Secondary | ICD-10-CM | POA: Insufficient documentation

## 2016-10-19 DIAGNOSIS — Y9389 Activity, other specified: Secondary | ICD-10-CM | POA: Insufficient documentation

## 2016-10-19 DIAGNOSIS — M25562 Pain in left knee: Secondary | ICD-10-CM

## 2016-10-19 DIAGNOSIS — S82092A Other fracture of left patella, initial encounter for closed fracture: Secondary | ICD-10-CM | POA: Diagnosis not present

## 2016-10-19 DIAGNOSIS — Z888 Allergy status to other drugs, medicaments and biological substances status: Secondary | ICD-10-CM | POA: Diagnosis not present

## 2016-10-19 DIAGNOSIS — S82042A Displaced comminuted fracture of left patella, initial encounter for closed fracture: Secondary | ICD-10-CM | POA: Diagnosis not present

## 2016-10-19 DIAGNOSIS — M1990 Unspecified osteoarthritis, unspecified site: Secondary | ICD-10-CM | POA: Diagnosis not present

## 2016-10-19 DIAGNOSIS — K219 Gastro-esophageal reflux disease without esophagitis: Secondary | ICD-10-CM | POA: Diagnosis not present

## 2016-10-19 DIAGNOSIS — R2689 Other abnormalities of gait and mobility: Secondary | ICD-10-CM

## 2016-10-19 DIAGNOSIS — F17210 Nicotine dependence, cigarettes, uncomplicated: Secondary | ICD-10-CM | POA: Insufficient documentation

## 2016-10-19 HISTORY — PX: ORIF PATELLA: SHX5033

## 2016-10-19 LAB — CBC
HEMATOCRIT: 32.6 % — AB (ref 36.0–46.0)
Hemoglobin: 11 g/dL — ABNORMAL LOW (ref 12.0–15.0)
MCH: 29.7 pg (ref 26.0–34.0)
MCHC: 33.7 g/dL (ref 30.0–36.0)
MCV: 88.1 fL (ref 78.0–100.0)
Platelets: 192 10*3/uL (ref 150–400)
RBC: 3.7 MIL/uL — ABNORMAL LOW (ref 3.87–5.11)
RDW: 13.7 % (ref 11.5–15.5)
WBC: 9.8 10*3/uL (ref 4.0–10.5)

## 2016-10-19 LAB — CREATININE, SERUM
Creatinine, Ser: 0.65 mg/dL (ref 0.44–1.00)
GFR calc Af Amer: >60 mL/min (ref >60)
GFR calc non Af Amer: >60 mL/min (ref >60)

## 2016-10-19 SURGERY — OPEN REDUCTION INTERNAL FIXATION (ORIF) PATELLA
Anesthesia: Spinal | Site: Knee | Laterality: Left

## 2016-10-19 MED ORDER — ALPRAZOLAM 1 MG PO TABS
1.0000 mg | ORAL_TABLET | Freq: Three times a day (TID) | ORAL | Status: DC | PRN
  Administered 2016-10-19 – 2016-10-20 (×4): 1 mg via ORAL
  Filled 2016-10-19 (×3): qty 1

## 2016-10-19 MED ORDER — PANTOPRAZOLE SODIUM 40 MG PO TBEC
40.0000 mg | DELAYED_RELEASE_TABLET | Freq: Two times a day (BID) | ORAL | Status: DC
  Filled 2016-10-19: qty 1

## 2016-10-19 MED ORDER — CEFAZOLIN SODIUM-DEXTROSE 2-4 GM/100ML-% IV SOLN
INTRAVENOUS | Status: AC
  Filled 2016-10-19: qty 100

## 2016-10-19 MED ORDER — FENTANYL CITRATE (PF) 100 MCG/2ML IJ SOLN
25.0000 ug | INTRAMUSCULAR | Status: AC | PRN
  Administered 2016-10-19 (×2): 25 ug via INTRAVENOUS

## 2016-10-19 MED ORDER — HYDROCHLOROTHIAZIDE 12.5 MG PO CAPS
12.5000 mg | ORAL_CAPSULE | Freq: Every day | ORAL | Status: DC
  Administered 2016-10-20: 12.5 mg via ORAL
  Filled 2016-10-19: qty 1

## 2016-10-19 MED ORDER — PROPOFOL 500 MG/50ML IV EMUL
INTRAVENOUS | Status: DC | PRN
  Administered 2016-10-19: 14:00:00 via INTRAVENOUS
  Administered 2016-10-19: 35 ug/kg/min via INTRAVENOUS

## 2016-10-19 MED ORDER — ONDANSETRON HCL 4 MG/2ML IJ SOLN: 4.0000 mg | Freq: Four times a day (QID) | INTRAMUSCULAR | Status: DC | PRN

## 2016-10-19 MED ORDER — ACETAMINOPHEN 650 MG RE SUPP: 650.0000 mg | Freq: Four times a day (QID) | RECTAL | Status: DC | PRN

## 2016-10-19 MED ORDER — MIDAZOLAM HCL 2 MG/2ML IJ SOLN
0.5000 mg | INTRAMUSCULAR | Status: DC | PRN
  Administered 2016-10-19: 2 mg via INTRAVENOUS

## 2016-10-19 MED ORDER — BUPIVACAINE IN DEXTROSE 0.75-8.25 % IT SOLN
INTRATHECAL | Status: DC | PRN
  Administered 2016-10-19: 15 mg via INTRATHECAL

## 2016-10-19 MED ORDER — METOCLOPRAMIDE HCL 5 MG/ML IJ SOLN: 5.0000 mg | Freq: Three times a day (TID) | INTRAMUSCULAR | Status: DC | PRN

## 2016-10-19 MED ORDER — CALCIUM CARBONATE 1250 (500 CA) MG PO TABS
1.0000 | ORAL_TABLET | Freq: Every day | ORAL | Status: DC
  Administered 2016-10-20 (×2): 500 mg via ORAL
  Filled 2016-10-19 (×3): qty 1

## 2016-10-19 MED ORDER — HYDROCORTISONE ACETATE 25 MG RE SUPP
25.0000 mg | Freq: Two times a day (BID) | RECTAL | Status: DC
  Administered 2016-10-20 (×2): 25 mg via RECTAL
  Filled 2016-10-19 (×3): qty 1

## 2016-10-19 MED ORDER — ADULT MULTIVITAMIN W/MINERALS CH
1.0000 | ORAL_TABLET | Freq: Every day | ORAL | Status: DC
  Administered 2016-10-20 (×2): 1 via ORAL
  Filled 2016-10-19 (×3): qty 1

## 2016-10-19 MED ORDER — FENTANYL CITRATE (PF) 100 MCG/2ML IJ SOLN
INTRAMUSCULAR | Status: AC
  Filled 2016-10-19: qty 2

## 2016-10-19 MED ORDER — DIPHENHYDRAMINE HCL 25 MG PO CAPS: 25.0000 mg | ORAL_CAPSULE | ORAL | Status: DC | PRN

## 2016-10-19 MED ORDER — OXYCODONE-ACETAMINOPHEN 5-325 MG PO TABS
1.0000 | ORAL_TABLET | ORAL | Status: DC | PRN
  Administered 2016-10-19 – 2016-10-20 (×3): 1 via ORAL
  Filled 2016-10-19 (×4): qty 1

## 2016-10-19 MED ORDER — SODIUM CHLORIDE 0.9 % IJ SOLN
INTRAMUSCULAR | Status: AC
  Filled 2016-10-19: qty 40

## 2016-10-19 MED ORDER — CEFAZOLIN SODIUM-DEXTROSE 2-4 GM/100ML-% IV SOLN
2.0000 g | Freq: Four times a day (QID) | INTRAVENOUS | Status: AC
  Administered 2016-10-19 – 2016-10-20 (×2): 2 g via INTRAVENOUS
  Filled 2016-10-19 (×2): qty 100

## 2016-10-19 MED ORDER — BUPIVACAINE-EPINEPHRINE (PF) 0.25% -1:200000 IJ SOLN
INTRAMUSCULAR | Status: AC
  Filled 2016-10-19: qty 30

## 2016-10-19 MED ORDER — EPHEDRINE SULFATE 50 MG/ML IJ SOLN
INTRAMUSCULAR | Status: DC | PRN
  Administered 2016-10-19: 10 mg via INTRAVENOUS

## 2016-10-19 MED ORDER — BUPIVACAINE IN DEXTROSE 0.75-8.25 % IT SOLN
INTRATHECAL | Status: AC
  Filled 2016-10-19: qty 2

## 2016-10-19 MED ORDER — CALCIUM CARBONATE 600 MG PO TABS: 600.0000 mg | ORAL_TABLET | Freq: Every day | ORAL | Status: DC

## 2016-10-19 MED ORDER — BUPIVACAINE-EPINEPHRINE (PF) 0.25% -1:200000 IJ SOLN
INTRAMUSCULAR | Status: DC | PRN
  Administered 2016-10-19: 60 mL via PERINEURAL

## 2016-10-19 MED ORDER — SUCCINYLCHOLINE CHLORIDE 20 MG/ML IJ SOLN
INTRAMUSCULAR | Status: AC
  Filled 2016-10-19: qty 1

## 2016-10-19 MED ORDER — MIDAZOLAM HCL 2 MG/2ML IJ SOLN
INTRAMUSCULAR | Status: AC
  Filled 2016-10-19: qty 2

## 2016-10-19 MED ORDER — HYDROMORPHONE HCL 1 MG/ML IJ SOLN: 0.2500 mg | INTRAMUSCULAR | Status: DC | PRN

## 2016-10-19 MED ORDER — VITAMIN D 1000 UNITS PO TABS
1000.0000 [IU] | ORAL_TABLET | Freq: Three times a day (TID) | ORAL | Status: DC
  Administered 2016-10-19 – 2016-10-20 (×2): 1000 [IU] via ORAL
  Filled 2016-10-19 (×2): qty 1

## 2016-10-19 MED ORDER — OXYCODONE-ACETAMINOPHEN 5-325 MG PO TABS
1.0000 | ORAL_TABLET | Freq: Once | ORAL | Status: AC
  Administered 2016-10-19: 1 via ORAL
  Filled 2016-10-19: qty 1

## 2016-10-19 MED ORDER — ALBUTEROL SULFATE HFA 108 (90 BASE) MCG/ACT IN AERS: 2.0000 | INHALATION_SPRAY | Freq: Four times a day (QID) | RESPIRATORY_TRACT | Status: DC | PRN

## 2016-10-19 MED ORDER — ROCURONIUM BROMIDE 50 MG/5ML IV SOLN
INTRAVENOUS | Status: AC
  Filled 2016-10-19: qty 1

## 2016-10-19 MED ORDER — ALUM & MAG HYDROXIDE-SIMETH 200-200-20 MG/5ML PO SUSP: 30.0000 mL | ORAL | Status: DC | PRN

## 2016-10-19 MED ORDER — MENTHOL 3 MG MT LOZG: 1.0000 | LOZENGE | OROMUCOSAL | Status: DC | PRN

## 2016-10-19 MED ORDER — SODIUM CHLORIDE 0.9 % IV SOLN
INTRAVENOUS | Status: AC
  Administered 2016-10-19: 17:00:00 via INTRAVENOUS

## 2016-10-19 MED ORDER — METOCLOPRAMIDE HCL 10 MG PO TABS: 5.0000 mg | ORAL_TABLET | Freq: Three times a day (TID) | ORAL | Status: DC | PRN

## 2016-10-19 MED ORDER — CEFAZOLIN SODIUM-DEXTROSE 2-4 GM/100ML-% IV SOLN: 2.0000 g | INTRAVENOUS | Status: DC

## 2016-10-19 MED ORDER — PHENOL 1.4 % MT LIQD: 1.0000 | OROMUCOSAL | Status: DC | PRN

## 2016-10-19 MED ORDER — SIMVASTATIN 10 MG PO TABS
10.0000 mg | ORAL_TABLET | Freq: Every day | ORAL | Status: DC
  Administered 2016-10-19: 10 mg via ORAL
  Filled 2016-10-19: qty 1

## 2016-10-19 MED ORDER — ONDANSETRON HCL 4 MG PO TABS: 4.0000 mg | ORAL_TABLET | Freq: Four times a day (QID) | ORAL | Status: DC | PRN

## 2016-10-19 MED ORDER — BUPIVACAINE LIPOSOME 1.3 % IJ SUSP
INTRAMUSCULAR | Status: AC
  Filled 2016-10-19: qty 20

## 2016-10-19 MED ORDER — VITAMIN C 500 MG PO TABS
1000.0000 mg | ORAL_TABLET | Freq: Every day | ORAL | Status: DC
  Administered 2016-10-20 (×2): 1000 mg via ORAL
  Filled 2016-10-19 (×3): qty 2

## 2016-10-19 MED ORDER — FENTANYL CITRATE (PF) 100 MCG/2ML IJ SOLN
INTRAMUSCULAR | Status: DC | PRN
  Administered 2016-10-19: 25 ug via INTRATHECAL
  Administered 2016-10-19: 25 ug via INTRAVENOUS
  Administered 2016-10-19: 50 ug via INTRAVENOUS

## 2016-10-19 MED ORDER — PHENYLEPHRINE HCL 10 MG/ML IJ SOLN
INTRAMUSCULAR | Status: DC | PRN
  Administered 2016-10-19: 80 ug via INTRAVENOUS
  Administered 2016-10-19: 40 ug via INTRAVENOUS
  Administered 2016-10-19 (×3): 80 ug via INTRAVENOUS

## 2016-10-19 MED ORDER — PAROXETINE HCL 20 MG PO TABS
20.0000 mg | ORAL_TABLET | Freq: Every day | ORAL | Status: DC
  Administered 2016-10-20 (×2): 20 mg via ORAL
  Filled 2016-10-19 (×3): qty 1

## 2016-10-19 MED ORDER — PANTOPRAZOLE SODIUM 40 MG PO TBEC
40.0000 mg | DELAYED_RELEASE_TABLET | Freq: Two times a day (BID) | ORAL | Status: DC
  Administered 2016-10-19 – 2016-10-20 (×2): 40 mg via ORAL
  Filled 2016-10-19: qty 1

## 2016-10-19 MED ORDER — PROPOFOL 10 MG/ML IV BOLUS
INTRAVENOUS | Status: AC
  Filled 2016-10-19: qty 20

## 2016-10-19 MED ORDER — LISINOPRIL-HYDROCHLOROTHIAZIDE 10-12.5 MG PO TABS: 1.0000 | ORAL_TABLET | Freq: Every day | ORAL | Status: DC

## 2016-10-19 MED ORDER — LISINOPRIL 10 MG PO TABS
10.0000 mg | ORAL_TABLET | Freq: Every day | ORAL | Status: DC
  Administered 2016-10-20 (×2): 10 mg via ORAL
  Filled 2016-10-19 (×2): qty 1
  Filled 2016-10-19: qty 2

## 2016-10-19 MED ORDER — ENOXAPARIN SODIUM 30 MG/0.3ML ~~LOC~~ SOLN
30.0000 mg | SUBCUTANEOUS | Status: DC
  Administered 2016-10-20: 30 mg via SUBCUTANEOUS
  Filled 2016-10-19: qty 0.3

## 2016-10-19 MED ORDER — DICYCLOMINE HCL 10 MG PO CAPS
10.0000 mg | ORAL_CAPSULE | Freq: Four times a day (QID) | ORAL | Status: DC | PRN
  Administered 2016-10-19 – 2016-10-20 (×2): 10 mg via ORAL
  Filled 2016-10-19 (×2): qty 1

## 2016-10-19 MED ORDER — LACTATED RINGERS IV SOLN
INTRAVENOUS | Status: DC
  Administered 2016-10-19: 12:00:00 via INTRAVENOUS

## 2016-10-19 MED ORDER — SODIUM CHLORIDE 0.9 % IR SOLN
Status: DC | PRN
  Administered 2016-10-19: 1000 mL

## 2016-10-19 MED ORDER — FLUTICASONE PROPIONATE 50 MCG/ACT NA SUSP
2.0000 | Freq: Every day | NASAL | Status: DC
  Administered 2016-10-20 (×2): 2 via NASAL
  Filled 2016-10-19: qty 16

## 2016-10-19 MED ORDER — CHLORHEXIDINE GLUCONATE 4 % EX LIQD: 60.0000 mL | Freq: Once | CUTANEOUS | Status: DC

## 2016-10-19 MED ORDER — MIDAZOLAM HCL 5 MG/5ML IJ SOLN
INTRAMUSCULAR | Status: DC | PRN
  Administered 2016-10-19 (×2): 2 mg via INTRAVENOUS

## 2016-10-19 MED ORDER — ALBUTEROL SULFATE (2.5 MG/3ML) 0.083% IN NEBU: 2.5000 mg | INHALATION_SOLUTION | Freq: Four times a day (QID) | RESPIRATORY_TRACT | Status: DC | PRN

## 2016-10-19 MED ORDER — ETOMIDATE 2 MG/ML IV SOLN
INTRAVENOUS | Status: AC
  Filled 2016-10-19: qty 10

## 2016-10-19 MED ORDER — MORPHINE SULFATE (PF) 2 MG/ML IV SOLN
2.0000 mg | INTRAVENOUS | Status: DC | PRN
  Administered 2016-10-19 – 2016-10-20 (×5): 2 mg via INTRAVENOUS
  Filled 2016-10-19 (×5): qty 1

## 2016-10-19 MED ORDER — GLYCOPYRROLATE 0.2 MG/ML IJ SOLN
INTRAMUSCULAR | Status: AC
  Filled 2016-10-19: qty 2

## 2016-10-19 MED ORDER — ACETAMINOPHEN 325 MG PO TABS
650.0000 mg | ORAL_TABLET | Freq: Four times a day (QID) | ORAL | Status: DC | PRN
  Administered 2016-10-19: 650 mg via ORAL
  Filled 2016-10-19: qty 2

## 2016-10-19 SURGICAL SUPPLY — 58 items
18 gauge wire ×2 IMPLANT
BAG HAMPER (MISCELLANEOUS) ×3 IMPLANT
BANDAGE ELASTIC 4 LF NS (GAUZE/BANDAGES/DRESSINGS) ×2 IMPLANT
BANDAGE ELASTIC 6 LF NS (GAUZE/BANDAGES/DRESSINGS) ×2 IMPLANT
BANDAGE ELASTIC 6 VELCRO NS (GAUZE/BANDAGES/DRESSINGS) ×3 IMPLANT
BANDAGE ESMARK 6X9 LF (GAUZE/BANDAGES/DRESSINGS) ×1 IMPLANT
BIT DRILL 2.8X128 (BIT) ×1 IMPLANT
BIT DRILL 2.8X128MM (BIT)
BLADE SURG SZ10 CARB STEEL (BLADE) ×3 IMPLANT
BNDG CMPR 9X6 STRL LF SNTH (GAUZE/BANDAGES/DRESSINGS) ×1
BNDG CMPR MED 5X4 ELC HKLP NS (GAUZE/BANDAGES/DRESSINGS) ×1
BNDG CMPR MED 5X6 ELC HKLP NS (GAUZE/BANDAGES/DRESSINGS) ×1
BNDG COHESIVE 4X5 TAN STRL (GAUZE/BANDAGES/DRESSINGS) ×3 IMPLANT
BNDG ESMARK 6X9 LF (GAUZE/BANDAGES/DRESSINGS) ×3
CHLORAPREP W/TINT 26ML (MISCELLANEOUS) ×3 IMPLANT
CLOTH BEACON ORANGE TIMEOUT ST (SAFETY) ×3 IMPLANT
COVER LIGHT HANDLE STERIS (MISCELLANEOUS) ×6 IMPLANT
CUFF TOURNIQUET SINGLE 34IN LL (TOURNIQUET CUFF) ×2 IMPLANT
DECANTER SPIKE VIAL GLASS SM (MISCELLANEOUS) ×4 IMPLANT
DRAPE C-ARM FOLDED MOBILE STRL (DRAPES) ×3 IMPLANT
DRAPE INCISE IOBAN 66X45 STRL (DRAPES) ×3 IMPLANT
DRAPE PROXIMA HALF (DRAPES) ×5 IMPLANT
DRSG MEPILEX BORDER 4X12 (GAUZE/BANDAGES/DRESSINGS) ×3 IMPLANT
ELECT REM PT RETURN 9FT ADLT (ELECTROSURGICAL) ×3
ELECTRODE REM PT RTRN 9FT ADLT (ELECTROSURGICAL) ×1 IMPLANT
GAUZE SPONGE 4X4 12PLY STRL (GAUZE/BANDAGES/DRESSINGS) IMPLANT
GLOVE BIOGEL PI IND STRL 7.0 (GLOVE) ×1 IMPLANT
GLOVE BIOGEL PI INDICATOR 7.0 (GLOVE) ×2
GLOVE SKINSENSE NS SZ8.0 LF (GLOVE) ×2
GLOVE SKINSENSE STRL SZ8.0 LF (GLOVE) ×1 IMPLANT
GLOVE SS N UNI LF 8.5 STRL (GLOVE) ×3 IMPLANT
GOWN STRL REUS W/ TWL LRG LVL3 (GOWN DISPOSABLE) ×2 IMPLANT
GOWN STRL REUS W/TWL LRG LVL3 (GOWN DISPOSABLE) ×6
GOWN STRL REUS W/TWL XL LVL3 (GOWN DISPOSABLE) ×3 IMPLANT
IMMOBILIZER KNEE 19 UNV (ORTHOPEDIC SUPPLIES) ×1 IMPLANT
INST SET MINOR BONE (KITS) ×3 IMPLANT
K-WIRE DBL PT .062 (WIRE) ×8 IMPLANT
KIT ROOM TURNOVER APOR (KITS) ×3 IMPLANT
MANIFOLD NEPTUNE II (INSTRUMENTS) ×3 IMPLANT
NDL HYPO 21X1.5 SAFETY (NEEDLE) ×1 IMPLANT
NEEDLE HYPO 21X1.5 SAFETY (NEEDLE) ×3 IMPLANT
NS IRRIG 1000ML POUR BTL (IV SOLUTION) ×3 IMPLANT
PACK BASIC LIMB (CUSTOM PROCEDURE TRAY) ×3 IMPLANT
PAD ARMBOARD 7.5X6 YLW CONV (MISCELLANEOUS) ×3 IMPLANT
PASSER SUT SWANSON 36MM LOOP (INSTRUMENTS) ×3 IMPLANT
SET BASIN LINEN APH (SET/KITS/TRAYS/PACK) ×3 IMPLANT
SPONGE LAP 18X18 X RAY DECT (DISPOSABLE) ×5 IMPLANT
STAPLER VISISTAT 35W (STAPLE) ×2 IMPLANT
SUT BRALON NAB BRD #1 30IN (SUTURE) ×4 IMPLANT
SUT ETHIBOND 5 LR DA (SUTURE) ×3 IMPLANT
SUT MON AB 0 CT1 (SUTURE) ×3 IMPLANT
SUT MON AB 2-0 CT1 36 (SUTURE) IMPLANT
SUT STEEL 7 (SUTURE) ×2 IMPLANT
SUT VIC AB 1 CT1 27 (SUTURE) ×3
SUT VIC AB 1 CT1 27XBRD ANTBC (SUTURE) IMPLANT
SYR 30ML LL (SYRINGE) ×3 IMPLANT
SYR BULB IRRIGATION 50ML (SYRINGE) ×3 IMPLANT
TOWEL OR 17X26 4PK STRL BLUE (TOWEL DISPOSABLE) ×3 IMPLANT

## 2016-10-19 NOTE — Op Note (Signed)
10/19/2016  2:55 PM  PATIENT:  [name]  71 y.o. female  PRE-OPERATIVE DIAGNOSIS:  LEFT PATELLA FRACTURE  POST-OPERATIVE DIAGNOSIS:  LEFT PATELLA FRACTURE  PROCEDURE:  Procedure(s): OPEN REDUCTION INTERNAL (ORIF) FIXATION PATELLA (Left)  18G WIRE AND 3 0.062 K WIRES AND # 5 ETHIBOND   Operative findings comminuted fracture left patella with proximal piece in 2 parts in the distal piece and one part  Details of procedure patient was identified in the preop area the left leg was marked for surgery she was taken to the operating room for spinal anesthetic  In the supine position the left leg was prepped and draped sterilely  The limb was examined 80 with a six-inch Esmarch and the tourniquet was elevated 300 mmHg  Timeout was completed  A midline incision was made slightly curved around an abrasion over the front of the knee and divided down to the extensor mechanism. The fracture was encountered debrided after irrigation and manually reduced. 3 K wires were placed in the patella to secure it a rent was made in the lateral retinaculum and finger palpation confirmed reduction along with C-arm.  We then placed a #18-gauge wire around the K wires placed in a figure-of-eight fashion and tensioned the figure-of-eight wire  Final x-rays were obtained wires were cut and bent #5 suture was placed in a circumferential fashion and tied  0-90 flexion was obtained with no tension on the fracture site  Wound was irrigated and closed with #1 Vicryl for the small incision made in the lateral retinaculum and then #0 Monocryl in the subcutaneous tissue  We injected 60 mL of Marcaine with epinephrine and applied skin staples  The foot and leg were wrapped up to the thigh and a knee immobilizer was placed in extension  Postop plan Postop day 14 x-ray and staple removal apply hinged knee brace 0-60 FULL weightbearing Week 2 through 60-60 range of motion full weightbearing x-ray at postop  day 42 X-ray at week 6  Assisted by [place]:  Surgeon(s) and Role:    * Carole Civil, MD - Primary   ANESTHESIA:   spinal  EBL:  Total I/O In: 850 [I.V.:850] Out: 10 [Blood:10]  BLOOD ADMINISTERED:none  DRAINS: none   LOCAL MEDICATIONS USED:  MARCAINE   , Amount: 60 ml and OTHER EPI  SPECIMEN:  No Specimen  DISPOSITION OF SPECIMEN:  N/A  COUNTS:  YES  TOURNIQUET:   Total Tourniquet Time Documented: Thigh (Left) - 84 minutes Total: Thigh (Left) - 84 minutes   DICTATION: .Viviann Spare Dictation  PLAN OF CARE: Admit to inpatient   PATIENT DISPOSITION:  PACU - hemodynamically stable.   Delay start of Pharmacological VTE agent (>24hrs) due to surgical blood loss or risk of bleeding: yes  [phone]

## 2016-10-19 NOTE — H&P (View-Only) (Signed)
HISTORY  Chief Complaint  Patient presents with  . Follow-up    LEFT KNEE FRACTURE, DOI 10/16/16    HPI [name] is a 71 y.o. female.  Presents for evaluation and treatment of left patella fracture The patient fell at a local convenience store and [name] went to the ER for evaluation and presents for treatment of her patella fracture  Location pain left knee Quality dull ache Severity moderate Duration one day Timing worse with movement    Review of Systems Review of Systems  Respiratory: Negative for shortness of breath.   Cardiovascular: Negative for chest pain.  All other systems reviewed and are negative.   Past Medical History:  Diagnosis Date  . Angina    with indigestion  . Anxiety   . Arthritis    all over esp. in joints  . Complication of anesthesia   . COPD (chronic obstructive pulmonary disease) ([place])   . Depression   . GERD (gastroesophageal reflux disease)   . Headache(784.0)    normal h/a  . HPV in female   . HTN (hypertension)   . Hypercholesteremia   . Hypercholesterolemia   . IBS (irritable bowel syndrome)   . Pneumonia 2007  . PONV (postoperative nausea and vomiting)   . S/P colonoscopy 05/30/2008   Dr. Deatra Ina: (records reviewed by AS, NP) findings normal to cecum, scattered diverticula in sigmoid colon   . S/P endoscopy 05/15/2008   Dr. Deatra Ina: (records reviewed by AS,NP): [facility] dilator for stricture distal esophagus, also noted duodenal stricture, op notes state unable to pass 65mm scope through pylorus  . Shortness of breath    with exertion  . Stomach ulcer     Past Surgical History:  Procedure Laterality Date  . APPENDECTOMY     71 years old  . BACK SURGERY     X 2  . BIOPSY  08/11/2016   Procedure: BIOPSY;  Surgeon: Daneil Dolin, MD;  [facility];  Service: Endoscopy;;  gastric bx's  . BREAST SURGERY    . CARPAL TUNNEL RELEASE     right hand  . CERVICAL BIOPSY  W/ LOOP ELECTRODE EXCISION  02/2011   CIN-2 with focal ectocervical margin involvement. With CIN-1 ECC negative  . CHOLECYSTECTOMY  1984  . ESOPHAGOGASTRODUODENOSCOPY  12/01/2011   Hiatal hernia/Mild fibrotic pyloric stenosis status post dilation with passage of  the scope.  The previously noted gastric ulcer completely healed  The remainder of the gastric mucosa appeared normal  . ESOPHAGOGASTRODUODENOSCOPY (EGD) WITH PROPOFOL N/A 08/11/2016   Procedure: ESOPHAGOGASTRODUODENOSCOPY (EGD) WITH PROPOFOL;  Surgeon: Daneil Dolin, MD;  [facility];  Service: Endoscopy;  Laterality: N/A;  2:15 PM  . MALONEY DILATION  07/28/2011   Schatzki's ring s/p 77F with small UES tear as well, small hh, 1cm pyloric channel ulcer, bx benign without H.Pylori. Mobic/goody's at time.  Marland Kitchen MALONEY DILATION N/A 08/11/2016   Procedure: Venia Minks DILATION;  Surgeon: Daneil Dolin, MD;  [facility];  Service: Endoscopy;  Laterality: N/A;  . TUBAL LIGATION  1976  . tumor removed from arm       Family History  Problem Relation Age of Onset  . Heart disease Father     CHF  . Diabetes Brother     type 2  . Colon cancer Neg Hx   . Anesthesia problems Neg Hx   . Hypotension Neg Hx   . Malignant hyperthermia Neg Hx   . Pseudochol deficiency Neg Hx   .  Colon polyps Neg Hx    The patient was quizzed about their family history and reported no history of bleeding problems or anesthesia problems in their family  Social History  Substance Use Topics  . Smoking status: Current Every Day Smoker    Packs/day: 1.00    Years: 50.00    Types: Cigarettes  . Smokeless tobacco: Never Used     Comment: since teenager  . Alcohol use No     Comment: hx of ETOH abuse in past, none in 10 years    Allergies  Allergen Reactions  . Meloxicam Other (See Comments)    Stomach ulcers    Current Meds  Medication Sig  . ALPRAZolam (XANAX) 1 MG tablet TAKE 1 TABLET 3 TIMES A DAY AS NEEDED FOR ANXIETY  . Aspirin-Acetaminophen-Caffeine (GOODYS  EXTRA STRENGTH) 500-325-65 MG PACK Take 1 packet by mouth every 8 (eight) hours as needed (for pain.).  Marland Kitchen calcium carbonate (OS-CAL) 600 MG TABS Take 600 mg by mouth daily.   . Cholecalciferol (VITAMIN D3) 1000 UNITS CAPS Take 3,000 mg by mouth daily.   Marland Kitchen dicyclomine (BENTYL) 10 MG capsule Take 1 capsule (10 mg total) by mouth 4 (four) times daily as needed for spasms. (Patient taking differently: Take 10 mg by mouth 4 (four) times daily as needed (for stomach spasms.). )  . diphenhydrAMINE (BENADRYL) 25 MG tablet Take 25 mg by mouth 2 (two) times daily as needed. For Allergies  . fluticasone (FLONASE) 50 MCG/ACT nasal spray PLACE 2 SPRAYS INTO BOTH NOSTRILS DAILY.  Marland Kitchen HYDROcodone-acetaminophen (NORCO) 10-325 MG tablet Take 1 tablet by mouth every 8 (eight) hours as needed. For Pain  . hydrocortisone (ANUSOL-HC) 25 MG suppository Place 1 suppository (25 mg total) rectally 2 (two) times daily.  Marland Kitchen lisinopril-hydrochlorothiazide (PRINZIDE,ZESTORETIC) 10-12.5 MG tablet Take 1 tablet by mouth daily.  . Multiple Vitamin (MULITIVITAMIN WITH MINERALS) TABS Take 1 tablet by mouth daily.  Marland Kitchen oxyCODONE (ROXICODONE) 5 MG immediate release tablet Take 1 tablet (5 mg total) by mouth every 4 (four) hours as needed for severe pain.  . pantoprazole (PROTONIX) 40 MG tablet Take 1 tablet (40 mg total) by mouth daily. (Patient taking differently: Take 40 mg by mouth 2 (two) times daily. )  . PARoxetine (PAXIL) 20 MG tablet Take 1 tablet (20 mg total) by mouth daily.  Marland Kitchen PROAIR HFA 108 (90 Base) MCG/ACT inhaler INHALE 2 PUFFS INTO THE LUNGS EVERY 6 (SIX) HOURS AS NEEDED FOR WHEEZING OR SHORTNESS OF BREATH.  . simvastatin (ZOCOR) 40 MG tablet TAKE 1 TABLET BY MOUTH AT BEEDTIME  . vitamin C (ASCORBIC ACID) 500 MG tablet Take 1,000 mg by mouth daily.     PHYSICAL EXAM   CONSTITUTIONAL:  BP (!) 83/47   Pulse 75   Resp 18  Development grooming hygiene normal. Body habitus: MESOMORPHIC  EYES The conjunctiva show no  injection or irritation in the eyelids are normal Pupils and irises are reactive to light and accommodation with normal size and symmetry  EARS   External inspection of ears and nose show no evidence of scars lesions or  masses in canal externally is clear  Hearing is normal to whispered voice and normal voice   NECK  no masses tracheal position is midline thyroid is not enlarged  RESPIRATORY   effort is normal and chest is non-tender  CARDIOVASCULAR   Heart location and size is normal without thrills and peripheral pulses are    normal with no peripheral edema or significant varicosities  LYMPH NODES   neck normal   supraclavicular area normal    groin normal   MUSCULOSKELETAL  The patient's a right and left upper extremity looked normal on inspection and no tenderness to palpation. Full range of motion is exhibited with no joint subluxation dislocation atrophy or tremor. There are no skin lesions and normal radial ulnar pulses are noted with normal epitrochlear lymph nodes normal sensation and normal coordination. No pathologic reflexes are observed.  Gait and station: No ability to ambulate on the left leg  SKIN:  Skin is normal in the extremities except for the left knee which has an abrasion over the front of the knee   No induration or nodularity  NEUROLOGIC:   DTR's were normal in the upper extremities and right lower extremity not tested left lower extremity due to knee fracture  sensation is normal in the in all 4 extremities   PSYCHIATRIC:   Judgment and insight normal  Orientation time person and place normal  Mood affect normal  MEDICAL DECISION MAKING   DIAGNOSIS Left patella fracture  IMAGING  X-rays left knee show left patella fracture significantly displaced  PLAN Recommend open treatment internal fixation left knee  This procedure has been fully reviewed with the patient and written informed consent has been obtained.  Most common complication  of hardware removal 2 years down the line discussed

## 2016-10-19 NOTE — Anesthesia Postprocedure Evaluation (Signed)
Anesthesia Post Note  Patient: [name]  Procedure(s) Performed: Procedure(s) (LRB): OPEN REDUCTION INTERNAL (ORIF) FIXATION PATELLA (Left)  Patient location during evaluation: PACU Anesthesia Type: Spinal Level of consciousness: awake and alert and oriented Pain management: pain level controlled Vital Signs Assessment: post-procedure vital signs reviewed and stable Respiratory status: spontaneous breathing Cardiovascular status: blood pressure returned to baseline Postop Assessment: no signs of nausea or vomiting and spinal receding Anesthetic complications: no     Last Vitals:  Vitals:   10/19/16 1225 10/19/16 1446  BP: 125/65 (!) 75/37  Pulse:  74  Resp: 12 (!) 8  Temp:  36.6 C    Last Pain:  Vitals:   10/19/16 1134  TempSrc: Oral  PainSc: 6                  Reiley Bertagnolli

## 2016-10-19 NOTE — Transfer of Care (Signed)
Immediate Anesthesia Transfer of Care Note  Patient: [name]  Procedure(s) Performed: Procedure(s): OPEN REDUCTION INTERNAL (ORIF) FIXATION PATELLA (Left)  Patient Location: PACU  Anesthesia Type:Spinal  Level of Consciousness: awake and patient cooperative  Airway & Oxygen Therapy: Patient Spontanous Breathing and Patient connected to nasal cannula oxygen  Post-op Assessment: Report given to RN and Post -op Vital signs reviewed and stable  Post vital signs: Reviewed and stable  Last Vitals:  Vitals:   10/19/16 1220 10/19/16 1225  BP:  125/65  Pulse:    Resp: (!) 36 12  Temp:      Last Pain:  Vitals:   10/19/16 1134  TempSrc: Oral  PainSc: 6       Patients Stated Pain Goal: 5 [phone] [phone])  Complications: No apparent anesthesia complications

## 2016-10-19 NOTE — Interval H&P Note (Signed)
History and Physical Interval Note:  10/19/2016 11:43 AM  [name]  has presented today for surgery, with the diagnosis of LEFT PATELLA FRACTURE  The various methods of treatment have been discussed with the patient and family. After consideration of risks, benefits and other options for treatment, the patient has consented to  Procedure(s): OPEN REDUCTION INTERNAL (ORIF) FIXATION PATELLA (Left) as a surgical intervention .  The patient's history has been reviewed, patient examined, no change in status, stable for surgery.  I have reviewed the patient's chart and labs.  Questions were answered to the patient's satisfaction.     Arther Abbott

## 2016-10-19 NOTE — Brief Op Note (Signed)
10/19/2016  2:55 PM  PATIENT:  [name]  71 y.o. female  PRE-OPERATIVE DIAGNOSIS:  LEFT PATELLA FRACTURE  POST-OPERATIVE DIAGNOSIS:  LEFT PATELLA FRACTURE  PROCEDURE:  Procedure(s): OPEN REDUCTION INTERNAL (ORIF) FIXATION PATELLA (Left)  18G WIRE AND 3 0.062 K WIRES AND # 5 ETHIBOND   Operative findings comminuted fracture left patella with proximal piece in 2 parts in the distal piece and one part  Details of procedure patient was identified in the preop area the left leg was marked for surgery she was taken to the operating room for spinal anesthetic  In the supine position the left leg was prepped and draped sterilely  The limb was examined 80 with a six-inch Esmarch and the tourniquet was elevated 300 mmHg  Timeout was completed  A midline incision was made slightly curved around an abrasion over the front of the knee and divided down to the extensor mechanism. The fracture was encountered debrided after irrigation and manually reduced. 3 K wires were placed in the patella to secure it a rent was made in the lateral retinaculum and finger palpation confirmed reduction along with C-arm.  We then placed a #18-gauge wire around the K wires placed in a figure-of-eight fashion and tensioned the figure-of-eight wire  Final x-rays were obtained wires were cut and bent #5 suture was placed in a circumferential fashion and tied  0-90 flexion was obtained with no tension on the fracture site  Wound was irrigated and closed with #1 Vicryl for the small incision made in the lateral retinaculum and then #0 Monocryl in the subcutaneous tissue  We injected 60 mL of Marcaine with epinephrine and applied skin staples  The foot and leg were wrapped up to the thigh and a knee immobilizer was placed in extension  Postop plan Postop day 14 x-ray and staple removal apply hinged knee brace 0-60 FULL weightbearing Week 2 through 60-60 range of motion full weightbearing x-ray at postop  day 42 X-ray at week 6  Assisted by [place]:  Surgeon(s) and Role:    * Carole Civil, MD - Primary   ANESTHESIA:   spinal  EBL:  Total I/O In: 850 [I.V.:850] Out: 10 [Blood:10]  BLOOD ADMINISTERED:none  DRAINS: none   LOCAL MEDICATIONS USED:  MARCAINE   , Amount: 60 ml and OTHER EPI  SPECIMEN:  No Specimen  DISPOSITION OF SPECIMEN:  N/A  COUNTS:  YES  TOURNIQUET:   Total Tourniquet Time Documented: Thigh (Left) - 84 minutes Total: Thigh (Left) - 84 minutes   DICTATION: .Viviann Spare Dictation  PLAN OF CARE: Admit to inpatient   PATIENT DISPOSITION:  PACU - hemodynamically stable.   Delay start of Pharmacological VTE agent (>24hrs) due to surgical blood loss or risk of bleeding: yes  [phone]

## 2016-10-19 NOTE — Anesthesia Preprocedure Evaluation (Signed)
Anesthesia Evaluation  Patient identified by MRN, date of birth, ID band Patient awake    Reviewed: Allergy & Precautions, H&P , NPO status , Patient's Chart, lab work & pertinent test results  History of Anesthesia Complications (+) PONV and history of anesthetic complications  Airway Mallampati: II   Neck ROM: Full    Dental  (+) Edentulous Upper, Edentulous Lower   Pulmonary shortness of breath, COPD, Current Smoker,    breath sounds clear to auscultation+ rhonchi        Cardiovascular hypertension, Pt. on medications (-) angina Rhythm:Regular Rate:Normal     Neuro/Psych  Headaches, PSYCHIATRIC DISORDERS Anxiety Depression    GI/Hepatic PUD, GERD  Medicated and Controlled,  Endo/Other    Renal/GU      Musculoskeletal   Abdominal   Peds  Hematology   Anesthesia Other Findings   Reproductive/Obstetrics                             Anesthesia Physical Anesthesia Plan  ASA: III  Anesthesia Plan: Spinal   Post-op Pain Management:    Induction: Intravenous  Airway Management Planned: Simple Face Mask  Additional Equipment:   Intra-op Plan:   Post-operative Plan:   Informed Consent: I have reviewed the patients History and Physical, chart, labs and discussed the procedure including the risks, benefits and alternatives for the proposed anesthesia with the patient or authorized representative who has indicated his/her understanding and acceptance.     Plan Discussed with:   Anesthesia Plan Comments:         Anesthesia Quick Evaluation

## 2016-10-19 NOTE — Anesthesia Procedure Notes (Signed)
Spinal  Patient location during procedure: OR Start time: 10/19/2016 12:45 PM Staffing Resident/CRNA: Bodee Lafoe, [place] Checklist Completed: patient identified, site marked, surgical consent, pre-op evaluation, timeout performed, IV checked, risks and benefits discussed and monitors and equipment checked Spinal Block Patient position: left lateral decubitus Prep: Betadine Patient monitoring: heart rate, cardiac monitor, continuous pulse ox and blood pressure Approach: midline Location: L3-4 Injection technique: single-shot Needle Needle type: Sprotte  Needle gauge: 22 G Needle length: 9 cm Assessment Sensory level: T6 Additional Notes Slow,clear CSF     [id number]       02 Oct 2017

## 2016-10-20 DIAGNOSIS — Z888 Allergy status to other drugs, medicaments and biological substances status: Secondary | ICD-10-CM | POA: Diagnosis not present

## 2016-10-20 DIAGNOSIS — M1990 Unspecified osteoarthritis, unspecified site: Secondary | ICD-10-CM | POA: Diagnosis not present

## 2016-10-20 DIAGNOSIS — Z8719 Personal history of other diseases of the digestive system: Secondary | ICD-10-CM | POA: Diagnosis not present

## 2016-10-20 DIAGNOSIS — S82042A Displaced comminuted fracture of left patella, initial encounter for closed fracture: Secondary | ICD-10-CM | POA: Diagnosis not present

## 2016-10-20 DIAGNOSIS — J449 Chronic obstructive pulmonary disease, unspecified: Secondary | ICD-10-CM | POA: Diagnosis not present

## 2016-10-20 DIAGNOSIS — E7800 Pure hypercholesterolemia, unspecified: Secondary | ICD-10-CM | POA: Diagnosis not present

## 2016-10-20 DIAGNOSIS — K219 Gastro-esophageal reflux disease without esophagitis: Secondary | ICD-10-CM | POA: Diagnosis not present

## 2016-10-20 DIAGNOSIS — I10 Essential (primary) hypertension: Secondary | ICD-10-CM | POA: Diagnosis not present

## 2016-10-20 DIAGNOSIS — K589 Irritable bowel syndrome without diarrhea: Secondary | ICD-10-CM | POA: Diagnosis not present

## 2016-10-20 MED ORDER — OXYCODONE-ACETAMINOPHEN 5-325 MG PO TABS: 1.0000 | ORAL_TABLET | ORAL | 0 refills | Status: DC | PRN

## 2016-10-20 MED ORDER — KETOROLAC TROMETHAMINE 30 MG/ML IJ SOLN
30.0000 mg | Freq: Four times a day (QID) | INTRAMUSCULAR | Status: DC
  Administered 2016-10-20: 30 mg via INTRAVENOUS
  Filled 2016-10-20: qty 1

## 2016-10-20 NOTE — Progress Notes (Signed)
Physical Therapy Treatment Patient Details Name: [name] MRN: [id number] DOB: 1946/07/06 Today's Date: 10/20/2016    History of Present Illness 71 y.o. female.  Presents for evaluation and treatment of left patella fracture.  The patient fell at a local convenience store and [name] went to the ER for evaluation and presents for treatment of her patella fracture.  She is now s/p L patella ORIF.       PT Comments    Pt supine in bed upon entrance.  Pt willing to participate with therapy today.  Lt knee pain scale 10/10 at entrance.  Pt. Independent with bed mobilty with increased time noted due to immobilizer.  Able to complete STS safely with supervision following cueing for handplacement and Lt foot forward due to immobilizer on LE.  Min guard with gait training, vc-ing to stand within walker and improve gait mechanics.  No LOB episodes noted during session.  Pt left in chair at end of session with LE elevated and call bell within reach.  Pt reports decreased pain Lt knee following gait.  Pt was encouraged to call RN if pain medication needed following session.  RN aware of pt. status   Follow Up Recommendations  Home health PT;Supervision/Assistance - 24 hour     Equipment Recommendations  None recommended by PT    Recommendations for Other Services       Precautions / Restrictions Precautions Precautions: Fall Precaution Comments: Reason for admission, and 1 other fall where she rolled off the couch, and sprained her R ankle - was given an air cast, but not still wearing it.  Required Braces or Orthoses: Knee Immobilizer - Left Knee Immobilizer - Left: On at all times Restrictions Weight Bearing Restrictions: No Other Position/Activity Restrictions: Full weight bearing L LE.     Mobility  Bed Mobility Overal bed mobility: Independent Bed Mobility: Supine to Sit     Supine to sit: Supervision     General bed mobility comments: Pt able to complete supine to  sit independently  Transfers Overall transfer level: Modified independent Equipment used: Rolling walker (2 wheeled) Transfers: Sit to/from Stand Sit to Stand: Min guard         General transfer comment: VC for hand placement to assist with safety STS from bed and toilet  Ambulation/Gait Ambulation/Gait assistance: Min guard Ambulation Distance (Feet): 65 Feet Assistive device: Rolling walker (2 wheeled) Gait Pattern/deviations: Step-through pattern;Decreased step length - left;Decreased stance time - left   Gait velocity interpretation: <1.8 ft/sec, indicative of risk for recurrent falls General Gait Details: VC to stand within walker for safety, vc's for smaller steps with Lt foot.  Pt. reports decreased pain following gait.     Stairs            Wheelchair Mobility    Modified Rankin (Stroke Patients Only)       Balance Overall balance assessment: History of Falls;Needs assistance Sitting-balance support: Bilateral upper extremity supported;Feet supported Sitting balance-Leahy Scale: Good     Standing balance support: Bilateral upper extremity supported Standing balance-Leahy Scale: Poor                      Cognition Arousal/Alertness: Awake/alert Behavior During Therapy: Impulsive (high pain scale) Overall Cognitive Status: Within Functional Limits for tasks assessed                 General Comments: Pt demonstrates poor safety awareness, and is impulsive.  Needs cues to slow down and stand  within walker during gait and perform things with appropriate safety measures and hand placement.     Exercises      General Comments        Pertinent Vitals/Pain Pain Assessment: No/denies pain Pain Score: 10-Worst pain ever Pain Location: L LE Pain Descriptors / Indicators: Sore;Aching;Discomfort Pain Intervention(s): Limited activity within patient's tolerance;Monitored during session;Repositioned;Patient requesting pain meds-RN  notified;Relaxation    Home Living   Living Arrangements: Alone Available Help at Discharge:  (Dtr stops by every day, and she keeps her 58 yo grandson for her) Type of Home: Apartment Home Access: Level entry   Home Layout: One level Home Equipment: Cane - single point;Shower seat;Walker - 2 wheels      Prior Function    Gait / Transfers Assistance Needed: independent ADL's / Homemaking Assistance Needed: independent with dressing, bathing, driving and unlimited community ambulation.   Comments: Pt cares for her 45 yo grandson in the afternoon while her dtr is at work.    PT Goals (current goals can now be found in the care plan section) Acute Rehab PT Goals Patient Stated Goal: To go home today.  PT Goal Formulation: With patient Time For Goal Achievement: 10/27/16 Potential to Achieve Goals: Good Progress towards PT goals: Progressing toward goals    Frequency    BID      PT Plan Current plan remains appropriate    Co-evaluation             End of Session Equipment Utilized During Treatment: Gait belt;Left knee immobilizer Activity Tolerance: Patient limited by pain;Patient limited by fatigue Patient left: in chair;with call bell/phone within reach     Time: 1140-1200 PT Time Calculation (min) (ACUTE ONLY): 20 min  Charges:  $Gait Training: 8-22 mins                    G Codes:   Ihor Austin, [place]; [facility]IS [phone]  Aldona Lento 10/20/2016, 12:06 PM

## 2016-10-20 NOTE — Discharge Summary (Signed)
Discharge summary  10/20/2016 3:25 PM  Physician Discharge Summary  Patient ID: [name] MRN: [id number] DOB/AGE: Jan 22, 1946 71 y.o.  Admit date: 10/19/2016 Discharge date: 10/20/2016  Admission Diagnoses: Left patella fracture  Discharge Diagnoses: Left patella fracture   Active Problems:   Left patella fracture   Discharged ConditiStable condition    Hospital Course: the patient came in on January 17 with a closed left patella fracture she had on tension band wiring. She was unstable for discharge secondary to her balance and due to weather related issues she was not safe to go home.  She was admitted for observation and physical therapy which she did well  C physical therapy notes  Discharge Exam: Blood pressure (!) 163/57, pulse 88, temperature 98.2 F (36.8 C), temperature source Oral, resp. rate 18, height 5\' 8"  (1.727 m), weight 186 lb (84.4 kg), SpO2 90 %. General appearance: alert, cooperative and no distress  she was neurovascular intact she could move her left leg and left foot she was able to ambulate as noted in the therapy notes   Disposition: 01-Home or Self Care  Discharge Instructions    Diet - low sodium heart healthy    Complete by:  As directed    Discharge instructions    Complete by:  As directed    Weight-bear as tolerated with the brace on and with the use of a walker  Developing open up the brace to apply ice to the knee when you're sitting down.  Keep the leg elevated  Perform 20 ankle pumps every hour while you are awake to prevent blood clots   Driving Restrictions    Complete by:  As directed    No driving for 4 weeks   Increase activity slowly    Complete by:  As directed      Allergies as of 10/20/2016      Reactions   Meloxicam Other (See Comments)   Stomach ulcers      Medication List    STOP taking these medications   HYDROcodone-acetaminophen 10-325 MG tablet Commonly known as:  NORCO   oxyCODONE 5 MG  immediate release tablet Commonly known as:  ROXICODONE     TAKE these medications   ALPRAZolam 1 MG tablet Commonly known as:  XANAX TAKE 1 TABLET 3 TIMES A DAY AS NEEDED FOR ANXIETY   aspirin-sod bicarb-citric acid 325 MG Tbef tablet Commonly known as:  ALKA-SELTZER Take 325 mg by mouth every 6 (six) hours as needed (cold/congestion).   calcium carbonate 600 MG Tabs tablet Commonly known as:  OS-CAL Take 600 mg by mouth daily.   dicyclomine 10 MG capsule Commonly known as:  BENTYL Take 1 capsule (10 mg total) by mouth 4 (four) times daily as needed for spasms. What changed:  reasons to take this   diphenhydrAMINE 25 MG tablet Commonly known as:  BENADRYL Take 25 mg by mouth 2 (two) times daily as needed. For Allergies   fluticasone 50 MCG/ACT nasal spray Commonly known as:  FLONASE PLACE 2 SPRAYS INTO BOTH NOSTRILS DAILY.   GOODYS EXTRA STRENGTH 500-325-65 MG Pack Generic drug:  Aspirin-Acetaminophen-Caffeine Take 1 packet by mouth every 8 (eight) hours as needed (for pain.).   lisinopril-hydrochlorothiazide 10-12.5 MG tablet Commonly known as:  PRINZIDE,ZESTORETIC Take 1 tablet by mouth daily.   multivitamin with minerals Tabs tablet Take 1 tablet by mouth daily.   oxyCODONE-acetaminophen 5-325 MG tablet Commonly known as:  PERCOCET/ROXICET Take 1 tablet by mouth every 4 (four) hours  as needed for severe pain.   pantoprazole 40 MG tablet Commonly known as:  PROTONIX Take 1 tablet (40 mg total) by mouth daily. What changed:  when to take this   PARoxetine 20 MG tablet Commonly known as:  PAXIL Take 1 tablet (20 mg total) by mouth daily.   PROAIR HFA 108 (90 Base) MCG/ACT inhaler Generic drug:  albuterol INHALE 2 PUFFS INTO THE LUNGS EVERY 6 (SIX) HOURS AS NEEDED FOR WHEEZING OR SHORTNESS OF BREATH.   simvastatin 40 MG tablet Commonly known as:  ZOCOR TAKE 1 TABLET BY MOUTH AT BEEDTIME What changed:  how much to take  how to take this  when to take  this  additional instructions   vitamin C 500 MG tablet Commonly known as:  ASCORBIC ACID Take 1,000 mg by mouth daily.   Vitamin D3 1000 units Caps Take 3,000 mg by mouth daily.        Signed: Arther Abbott 10/20/2016, 3:25 PM

## 2016-10-20 NOTE — Anesthesia Postprocedure Evaluation (Signed)
Anesthesia Post Note  Patient: [name]  Procedure(s) Performed: Procedure(s) (LRB): OPEN REDUCTION INTERNAL (ORIF) FIXATION PATELLA (Left)  Patient location during evaluation: Nursing Unit Anesthesia Type: Spinal Level of consciousness: awake and alert and oriented Pain control: Patient has tolerance to opioids due to taking them for chronic back pain; Toradol has been added to her regimen. Vital Signs Assessment: post-procedure vital signs reviewed and stable Respiratory status: spontaneous breathing and patient connected to nasal cannula oxygen Cardiovascular status: stable Postop Assessment: no headache and no signs of nausea or vomiting Anesthetic complications: no     Last Vitals:  Vitals:   10/20/16 0548 10/20/16 0827  BP:  (!) 163/57  Pulse:  88  Resp: 16 18  Temp:  36.8 C    Last Pain:  Vitals:   10/20/16 0827  TempSrc: Oral  PainSc:                  Brandice Busser A

## 2016-10-20 NOTE — Progress Notes (Signed)
Patient ID: [name], female   DOB: 30-Oct-1945, 71 y.o.   MRN: [id number] POD 1 S/P OTIF LEFT PATELLA  BP (!) 167/65 (BP Location: Left Arm)   Pulse 87   Temp 98.1 F (36.7 C) (Oral)   Resp 16   Ht 5\' 8"  (1.727 m)   Wt 186 lb (84.4 kg)   SpO2 94%   BMI 28.28 kg/m   The patient complains of pain but she is on hydrocodone 10 mg for chronic back pain so it is not surprising that her response to morphine and Percocet are not what we would see on someone who is not opioid tolerant.  She's moving her foot well she has no peripheral edema  She can start physical therapy and we will see if she can mobilize enough to go home  Impression stable postop day 1 after internal fixation left patella, opioid tolerance.  Plan start physical therapy assess mobilization and discharge plan, stop IV opioids. Add Toradol.

## 2016-10-20 NOTE — Care Management Note (Signed)
Case Management Note  Patient Details  Name: [name] MRN: [id number] Date of Birth: 02-24-1946  Subjective/Objective:                  Patient s/p ORIF for patella fracture. LIves alone, no DME/HH PTA. PT recommends HH PT. Patient agreeable. Offered choice of home health agencies. Patient states daughter is available for 24 hour supervision as recommended by PT.   Action/Plan: Tim of [facility] notified and will obtain orders from chart.    Expected Discharge Date:       10/21/2016           Expected Discharge Plan:  [place]  In-House Referral:  NA  Discharge planning Services  CM Consult  Post Acute Care Choice:  Home Health Choice offered to:  Patient  DME Arranged:    DME Agency:     HH Arranged:    [place]:  [facility] (now [facility])  Status of Service:  Completed, signed off  If discussed at [employer] of Stay Meetings, dates discussed:    Additional Comments:  [name], [name], RN 10/20/2016, 11:25 AM

## 2016-10-20 NOTE — Addendum Note (Signed)
Addendum  created 10/20/16 1041 by Mickel Baas, CRNA   Sign clinical note

## 2016-10-20 NOTE — Evaluation (Signed)
Physical Therapy Evaluation Patient Details Name: [name] MRN: [id number] DOB: 25-Jan-1946 Today's Date: 10/20/2016   History of Present Illness  71 y.o. female.  Presents for evaluation and treatment of left patella fracture.  The patient fell at a local convenience store and [name] went to the ER for evaluation and presents for treatment of her patella fracture.  She is now s/p L patella ORIF.     Clinical Impression  Pt received in bed, and is very figity, but is agreeable to PT evaluation.  Pt states she is normally independent with unlimited community ambulation, she is independent with ADL's, and driving.  She normally watches her 26 yo grandson in the afternoon/evenings.  During today's PT evaluation, she was able to perform supine<>sit with supervision and use of gait belt as leg lifter to advance L LE out of the bed.  She required Min guard/Min A for sit<>stand with RW due to poor safety awareness.  She was able to ambulate 74ft with RW and Min guard, but distance limited due to pt c/o dizziness.  Pt is recommended to d/c home with HHPT and 24/7 supervision/assistance which she states can be provided by her dtr and grandson.  She will need to increase gait distance during PM session to demonstrate safe household gait distance, as well as all transfers with improved level of assistance.      Follow Up Recommendations Home health PT;Supervision/Assistance - 24 hour    Equipment Recommendations  None recommended by PT    Recommendations for Other Services       Precautions / Restrictions Precautions Precautions: Fall Precaution Comments: Reason for admission, and 1 other fall where she rolled off the couch, and sprained her R ankle - was given an air cast, but not still wearing it.  Required Braces or Orthoses: Knee Immobilizer - Left Knee Immobilizer - Left: On at all times Restrictions Weight Bearing Restrictions: No Other Position/Activity Restrictions: Full weight  bearing L LE.       Mobility  Bed Mobility Overal bed mobility: Needs Assistance Bed Mobility: Supine to Sit     Supine to sit: Supervision     General bed mobility comments: Bed flat, and increased time required for transfer.  Pt was able to demonstrate using gait belt as a leg lifter to advance L LE out of the bed.  She requires vc's for hand placement during transfer.    Transfers Overall transfer level: Needs assistance Equipment used: Rolling walker (2 wheeled) Transfers: Sit to/from Stand Sit to Stand: Min guard;Min assist         General transfer comment: Vc's for hand placement - pt is impulsive and wants to try to pull up on RW.  PT educated pt on how RW tips backwards when using this technique and it is not safe.  Encouraged pt to shift weight anteriorly, and get her "nose over her toes."   Ambulation/Gait Ambulation/Gait assistance: Min assist Ambulation Distance (Feet): 25 Feet Assistive device: Rolling walker (2 wheeled) Gait Pattern/deviations: Step-to pattern   Gait velocity interpretation: <1.8 ft/sec, indicative of risk for recurrent falls General Gait Details: Pt requires vc's for smaller steps with the L foot.  She required increased time for gait.  She expresses feeling of dizziness during gait, therefore, pt returned to the room, and sat down in the chair.    Stairs            Wheelchair Mobility    Modified Rankin (Stroke Patients Only)  Balance Overall balance assessment: History of Falls;Needs assistance Sitting-balance support: Bilateral upper extremity supported;Feet supported Sitting balance-Leahy Scale: Good     Standing balance support: Bilateral upper extremity supported Standing balance-Leahy Scale: Poor                               Pertinent Vitals/Pain Pain Assessment: 0-10 Pain Score: 10-Worst pain ever Pain Location: L LE Pain Descriptors / Indicators: Aching;Burning;Stabbing Pain Intervention(s):  Limited activity within patient's tolerance;Monitored during session;Repositioned;Patient requesting pain meds-RN notified;Relaxation    Home Living   Living Arrangements: Alone Available Help at Discharge:  (Dtr stops by every day, and she keeps her 68 yo grandson for her) Type of Home: Apartment Home Access: Level entry     Home Layout: One level Home Equipment: Cane - single point;Shower seat;Walker - 2 wheels      Prior Function     Gait / Transfers Assistance Needed: independent  ADL's / Homemaking Assistance Needed: independent with dressing, bathing, driving and unlimited community ambulation.    Comments: Pt cares for her 91 yo grandson in the afternoon while her dtr is at work.      Hand Dominance   Dominant Hand: Right    Extremity/Trunk Assessment   Upper Extremity Assessment Upper Extremity Assessment: Overall WFL for tasks assessed    Lower Extremity Assessment Lower Extremity Assessment: LLE deficits/detail;Generalized weakness LLE: Unable to fully assess due to immobilization       Communication   Communication: No difficulties  Cognition Arousal/Alertness: Awake/alert Behavior During Therapy: Impulsive Overall Cognitive Status: Within Functional Limits for tasks assessed                 General Comments: Pt demonstrates poor safety awareness, and is impulsive.  Needs cues to slow down and perform things with appropriate safety measures and hand placement.     General Comments      Exercises     Assessment/Plan    PT Assessment Patient needs continued PT services  PT Problem List Decreased strength;Decreased activity tolerance;Decreased balance;Decreased mobility;Decreased knowledge of use of DME;Decreased safety awareness;Decreased knowledge of precautions;Pain          PT Treatment Interventions DME instruction;Gait training;Functional mobility training;Therapeutic activities;Therapeutic exercise;Balance training;Patient/family  education    PT Goals (Current goals can be found in the Care Plan section)  Acute Rehab PT Goals Patient Stated Goal: To go home today.  PT Goal Formulation: With patient Time For Goal Achievement: 10/27/16 Potential to Achieve Goals: Good    Frequency BID   Barriers to discharge        Co-evaluation               End of Session Equipment Utilized During Treatment: Gait belt;Left knee immobilizer Activity Tolerance: Patient limited by pain;Patient limited by fatigue Patient left: in chair;with call bell/phone within reach Nurse Communication: Mobility status (Tamika, RN notified of pain, Mobility sheet left up in the room.  )    Functional Assessment Tool Used: [place] "6-clicks"  Functional Limitation: Mobility: Walking and moving around Mobility: Walking and Moving Around Current Status [phone]): At least 20 percent but less than 40 percent impaired, limited or restricted Mobility: Walking and Moving Around Goal Status [phone]): At least 1 percent but less than 20 percent impaired, limited or restricted    Time: [id number] PT Time Calculation (min) (ACUTE ONLY): 36 min   Charges:   PT Evaluation $PT Eval Low Complexity:  1 Procedure PT Treatments $Gait Training: 8-22 mins   PT G Codes:   PT G-Codes **NOT FOR INPATIENT CLASS** Functional Assessment Tool Used: [employer] "6-clicks"  Functional Limitation: Mobility: Walking and moving around Mobility: Walking and Moving Around Current Status [phone]): At least 20 percent but less than 40 percent impaired, limited or restricted Mobility: Walking and Moving Around Goal Status [phone]): At least 1 percent but less than 20 percent impaired, limited or restricted    Beth Fontaine Hehl, PT, DPT X: [phone]

## 2016-10-20 NOTE — Progress Notes (Signed)
Paged Dr. Aline Brochure about patients requesting to be discharged waiting on the MD to call back.

## 2016-10-20 NOTE — Progress Notes (Signed)
Dr. Aline Brochure came up and spoke with the family. Patient discharged with instructions, prescription, and care notes.  Verbalized understanding via teach back.  IV was removed and the site was WNL. Patient voiced no further complaints or concerns at the time of discharge.  Appointments scheduled per instructions.  Patient left the floor via w/c family  And staff in stable condition.

## 2016-10-20 NOTE — Discharge Instructions (Signed)
When you are walking make sure you have your brace on in usual walker and apply as much weight on your foot as you can tolerate  You can open the brace when you are sitting to apply ice as needed  Do 20 ankle pumps every hour when you are awake

## 2016-10-20 NOTE — Care Management Obs Status (Signed)
[place] NOTIFICATION   Patient Details  Name: [name] MRN: [id number] Date of Birth: 1946/04/08   Medicare Observation Status Notification Given:  Yes    Glendell Schlottman, [name], RN 10/20/2016, 11:33 AM

## 2016-10-21 ENCOUNTER — Other Ambulatory Visit: Payer: Self-pay | Admitting: Nurse Practitioner

## 2016-10-21 ENCOUNTER — Encounter (HOSPITAL_COMMUNITY): Payer: Self-pay | Admitting: Orthopedic Surgery

## 2016-10-21 DIAGNOSIS — I10 Essential (primary) hypertension: Secondary | ICD-10-CM

## 2016-10-24 ENCOUNTER — Encounter: Payer: Self-pay | Admitting: Orthopedic Surgery

## 2016-10-24 ENCOUNTER — Ambulatory Visit (INDEPENDENT_AMBULATORY_CARE_PROVIDER_SITE_OTHER): Payer: Self-pay | Admitting: Orthopedic Surgery

## 2016-10-24 DIAGNOSIS — Z4889 Encounter for other specified surgical aftercare: Secondary | ICD-10-CM

## 2016-10-24 DIAGNOSIS — S82042D Displaced comminuted fracture of left patella, subsequent encounter for closed fracture with routine healing: Secondary | ICD-10-CM

## 2016-10-24 NOTE — Patient Instructions (Signed)
Brace continue when walking and when in bed do not remove the brace except to wash the leg keep the dressing dry and clean

## 2016-10-24 NOTE — Progress Notes (Signed)
Patient ID: [name], female   DOB: [birth year], 71 y.o.   MRN: [id number]  Follow up visit/  postop visit #1 postop day  Chief Complaint  Patient presents with  . Follow-up    POST OP 1, ORIF LEFT PATELLA, DOS 10/19/16   Dressing change, wound is clean dry and intact there is some surrounding erythema and ecchymosis throughout the limb there is no sign of infection or DVT  Staples out x-ray T score brace 0-60, next visit   Encounter Diagnoses  Name Primary?  Marland Kitchen Aftercare following surgery Yes  . Closed displaced comminuted fracture of left patella with routine healing, subsequent encounter     There were no vitals taken for this visit.  11:49 AM Arther Abbott, MD 10/24/2016

## 2016-10-26 ENCOUNTER — Telehealth: Payer: Self-pay | Admitting: Orthopedic Surgery

## 2016-10-26 NOTE — Telephone Encounter (Signed)
Routing to Dr Harrison for approval 

## 2016-10-26 NOTE — Telephone Encounter (Signed)
Oxycodone-acetaminophen  5/325mg    Qty  42 Tablets   Take 1 tablet by mouth every 4 (four) hours as needed for severe pain.

## 2016-10-27 ENCOUNTER — Telehealth: Payer: Self-pay | Admitting: Orthopedic Surgery

## 2016-10-27 DIAGNOSIS — S82002D Unspecified fracture of left patella, subsequent encounter for closed fracture with routine healing: Secondary | ICD-10-CM | POA: Diagnosis not present

## 2016-10-27 DIAGNOSIS — Z981 Arthrodesis status: Secondary | ICD-10-CM | POA: Diagnosis not present

## 2016-10-27 DIAGNOSIS — Z79891 Long term (current) use of opiate analgesic: Secondary | ICD-10-CM | POA: Diagnosis not present

## 2016-10-27 DIAGNOSIS — E785 Hyperlipidemia, unspecified: Secondary | ICD-10-CM | POA: Diagnosis not present

## 2016-10-27 DIAGNOSIS — Z7982 Long term (current) use of aspirin: Secondary | ICD-10-CM | POA: Diagnosis not present

## 2016-10-27 DIAGNOSIS — Z9181 History of falling: Secondary | ICD-10-CM | POA: Diagnosis not present

## 2016-10-27 DIAGNOSIS — W19XXXD Unspecified fall, subsequent encounter: Secondary | ICD-10-CM | POA: Diagnosis not present

## 2016-10-27 DIAGNOSIS — I10 Essential (primary) hypertension: Secondary | ICD-10-CM | POA: Diagnosis not present

## 2016-10-27 DIAGNOSIS — J449 Chronic obstructive pulmonary disease, unspecified: Secondary | ICD-10-CM | POA: Diagnosis not present

## 2016-10-27 NOTE — Telephone Encounter (Signed)
I did not order therapy and do not see instructions in notes, will forward to Dr Aline Brochure to advise

## 2016-10-27 NOTE — Telephone Encounter (Signed)
Darlina Guys from [facility]  Just called to let Dr. Aline Brochure know that the therapist will be going out to start [facility] services this afternoon with the patient.

## 2016-10-27 NOTE — Telephone Encounter (Signed)
[facility] - Physical Therapist- [phone] called and would like clarification for what needs to done as far as exercises, etc.  In essence, what is she allowed to do?  Please call Sharyn Lull at [phone]

## 2016-10-28 ENCOUNTER — Other Ambulatory Visit: Payer: Self-pay | Admitting: Orthopedic Surgery

## 2016-10-28 ENCOUNTER — Telehealth: Payer: Self-pay | Admitting: Orthopedic Surgery

## 2016-10-28 MED ORDER — OXYCODONE-ACETAMINOPHEN 5-325 MG PO TABS: 1.0000 | ORAL_TABLET | ORAL | 0 refills | Status: DC | PRN

## 2016-10-28 NOTE — Progress Notes (Signed)
[place] controlled substance reporting system reviewed  

## 2016-10-28 NOTE — Telephone Encounter (Signed)
Oxycodone-acetaminophen  5/325mg    Qty  42 Tablets   Take 1 tablet by mouth every 4 (four) hours as needed for severe pain.

## 2016-10-28 NOTE — Telephone Encounter (Signed)
No therapy other than gait training

## 2016-10-31 NOTE — Telephone Encounter (Signed)
RETURNED CALL, NO ANSWER, LEFT DETAILED VM 

## 2016-11-01 ENCOUNTER — Encounter: Payer: Self-pay | Admitting: Nurse Practitioner

## 2016-11-01 ENCOUNTER — Ambulatory Visit (INDEPENDENT_AMBULATORY_CARE_PROVIDER_SITE_OTHER): Payer: Medicare Other | Admitting: Nurse Practitioner

## 2016-11-01 VITALS — BP 111/71 | HR 97 | Temp 97.0°F | Ht 68.0 in | Wt 185.0 lb

## 2016-11-01 DIAGNOSIS — K219 Gastro-esophageal reflux disease without esophagitis: Secondary | ICD-10-CM | POA: Diagnosis not present

## 2016-11-01 DIAGNOSIS — F3342 Major depressive disorder, recurrent, in full remission: Secondary | ICD-10-CM | POA: Diagnosis not present

## 2016-11-01 DIAGNOSIS — Z6832 Body mass index (BMI) 32.0-32.9, adult: Secondary | ICD-10-CM

## 2016-11-01 DIAGNOSIS — E785 Hyperlipidemia, unspecified: Secondary | ICD-10-CM

## 2016-11-01 DIAGNOSIS — M545 Low back pain, unspecified: Secondary | ICD-10-CM

## 2016-11-01 DIAGNOSIS — E876 Hypokalemia: Secondary | ICD-10-CM

## 2016-11-01 DIAGNOSIS — G8929 Other chronic pain: Secondary | ICD-10-CM

## 2016-11-01 DIAGNOSIS — I10 Essential (primary) hypertension: Secondary | ICD-10-CM

## 2016-11-01 DIAGNOSIS — J449 Chronic obstructive pulmonary disease, unspecified: Secondary | ICD-10-CM | POA: Diagnosis not present

## 2016-11-01 DIAGNOSIS — F17200 Nicotine dependence, unspecified, uncomplicated: Secondary | ICD-10-CM | POA: Diagnosis not present

## 2016-11-01 DIAGNOSIS — F419 Anxiety disorder, unspecified: Secondary | ICD-10-CM

## 2016-11-01 MED ORDER — LISINOPRIL-HYDROCHLOROTHIAZIDE 10-12.5 MG PO TABS: 1.0000 | ORAL_TABLET | Freq: Every day | ORAL | 0 refills | Status: DC

## 2016-11-01 MED ORDER — HYDROCODONE-ACETAMINOPHEN 10-325 MG PO TABS: 1.0000 | ORAL_TABLET | Freq: Two times a day (BID) | ORAL | 0 refills | Status: DC

## 2016-11-01 MED ORDER — PANTOPRAZOLE SODIUM 40 MG PO TBEC: 40.0000 mg | DELAYED_RELEASE_TABLET | Freq: Every day | ORAL | 1 refills | Status: DC

## 2016-11-01 NOTE — Progress Notes (Signed)
Subjective:    Patient ID: [name], female    DOB: 1946-01-12, 71 y.o.   MRN: [id number]  Patient here today for follow up of chronic medical problems.NO changes since last visit.    Outpatient Encounter Prescriptions as of 07/28/2016  Medication Sig  . albuterol (PROVENTIL HFA;VENTOLIN HFA) 108 (90 Base) MCG/ACT inhaler Inhale 2 puffs into the lungs every 6 (six) hours as needed for wheezing or shortness of breath.  . ALPRAZolam (XANAX) 1 MG tablet Take 1 tablet (1 mg total) by mouth 3 (three) times daily as needed for anxiety.  . calcium carbonate (OS-CAL) 600 MG TABS Take 600 mg by mouth daily.   . chlorzoxazone (PARAFON) 500 MG tablet TAKE ONE TABLET BY MOUTH THREE TIMES DAILY AS NEEDED  . Cholecalciferol (VITAMIN D3) 1000 UNITS CAPS Take 3,000 mg by mouth daily.   Marland Kitchen dicyclomine (BENTYL) 10 MG capsule Take 1 capsule (10 mg total) by mouth 4 (four) times daily as needed for spasms.  . diphenhydrAMINE (BENADRYL) 25 MG tablet Take 25 mg by mouth 2 (two) times daily as needed. For Allergies  . fluticasone (FLONASE) 50 MCG/ACT nasal spray Place 2 sprays into both nostrils daily.  Marland Kitchen HYDROcodone-acetaminophen (NORCO) 10-325 MG tablet Take 1 tablet by mouth every 6 (six) hours as needed.  Marland Kitchen HYDROcodone-acetaminophen (NORCO) 10-325 MG tablet Take 1 tablet by mouth every 6 (six) hours as needed.  Marland Kitchen HYDROcodone-acetaminophen (NORCO) 10-325 MG tablet Take 1 tablet by mouth every 8 (eight) hours as needed. For Pain  . lisinopril-hydrochlorothiazide (PRINZIDE,ZESTORETIC) 10-12.5 MG tablet Take 1 tablet by mouth daily.  . Multiple Vitamin (MULITIVITAMIN WITH MINERALS) TABS Take 1 tablet by mouth daily.  . pantoprazole (PROTONIX) 40 MG tablet Take 1 tablet (40 mg total) by mouth daily.  Marland Kitchen PARoxetine (PAXIL) 20 MG tablet TAKE 1 TABLET (20 MG TOTAL) BY MOUTH DAILY.  . simvastatin (ZOCOR) 40 MG tablet TAKE 1 TABLET BY MOUTH AT BEEDTIME  . vitamin C (ASCORBIC ACID) 500 MG tablet Take 1,000 mg  by mouth daily.   * she fell and broke her knee cap a couples of weeks ago and had to have surgery.   Hypertension  This is a chronic problem. The current episode started more than 1 year ago. The problem is unchanged. The problem is controlled. Pertinent negatives include no palpitations. Risk factors for coronary artery disease include dyslipidemia, family history, post-menopausal state and sedentary lifestyle. Past treatments include ACE inhibitors and diuretics. The current treatment provides moderate improvement. Compliance problems include diet and exercise.   Hyperlipidemia  This is a chronic problem. The current episode started more than 1 year ago. Recent lipid tests were reviewed and are variable. Current antihyperlipidemic treatment includes statins. The current treatment provides moderate improvement of lipids. Compliance problems include adherence to diet and adherence to exercise.  Risk factors for coronary artery disease include dyslipidemia, hypertension and post-menopausal.  GAD/depression Currently takes Paxil 71m qd and xanax .517m1 tid.  Has stress at home taking care of husband who is recovering from lung cancer and now haleukemia. COPD/smoking Currently smokes 1 pack per day. Just not interested in stopping right now.  Is not on an inhaler due to cost. GERD Currently takes Protonix q day with good results.   Chronic back pain Currently taking Hydrocodone 10/32588m po tid for relief of lumbar pain and right leg pain.   Pain assessment: Cause of pain- No known cause for back pain- fell and broke knee cap and was  getting pain meds from ortho. Pain location- lower back Pain on scale of 1-10- 5-7/10 Frequency- daily What increases pain- to much movement What makes pain Better-resting Effects on ADL - dos what she needs to do despite pain Any change in general medical condition- none  Current medications- percocet 5/325 BID- but is almost out- was taking this for her knee  cap that she fell and broke. She originally was on lortab 10/325 TID Effectiveness of current meds-helps- painnever completely resolves Adverse reactions form pain meds-none Morphine equivalent>20  Pill count performed-No U[place] drug screen- No Was the [place] reviewed- yes  If yes were their any concerning findings? - no suspicious activity * had oxycodone rx form surgeon filled yesterday for QID #40 hypokalemia K supplements daily- no problems   Review of Systems  Constitutional: Negative.   Cardiovascular: Negative.  Negative for palpitations.  Gastrointestinal: Negative.   Musculoskeletal: Positive for back pain.          Skin: Negative.   Neurological: Negative.   Psychiatric/Behavioral: Negative.        Objective:   Physical Exam  Constitutional: She is oriented to person, place, and time. She appears well-developed and well-nourished.  HENT:  Nose: Nose normal.  Mouth/Throat: Oropharynx is clear and moist.  Eyes: EOM are normal.  Neck: Trachea normal, normal range of motion and full passive range of motion without pain. Neck supple. No JVD present. Carotid bruit is not present. No thyromegaly present.  Cardiovascular: Normal rate, regular rhythm, normal heart sounds and intact distal pulses.  Exam reveals no gallop and no friction rub.   No murmur heard. Pulmonary/Chest: Effort normal and breath sounds normal.  Abdominal: Soft. Bowel sounds are normal. She exhibits no distension and no mass. There is no tenderness.  Musculoskeletal:       Lumbar back: She exhibits decreased range of motion and pain.  Lymphadenopathy:    She has no cervical adenopathy.  Neurological: She is alert and oriented to person, place, and time. She has normal reflexes.  Skin: Skin is warm and dry.  Psychiatric: She has a normal mood and affect. Her behavior is normal. Judgment and thought content normal.   BP 111/71   Pulse 97   Temp 97 F (36.1 C) (Oral)   Ht _0  (1.727 m)   Wt 185  lb (83.9 kg)   BMI 28.13 kg/m      Assessment & Plan:  1. Benign hypertension Low sodium diet - lisinopril-hydrochlorothiazide (PRINZIDE,ZESTORETIC) 10-12.5 MG tablet; Take 1 tablet by mouth daily.  Dispense: 90 tablet; Refill: 0 - CMP14+EGFR  2. COPD (chronic obstructive pulmonary disease) with chronic bronchitis (HCC) Smoking cessation  3. Gastroesophageal reflux disease without esophagitis Avoid spicy foods Do not eat 2 hours prior to bedtime - pantoprazole (PROTONIX) 40 MG tablet; Take 1 tablet (40 mg total) by mouth daily.  Dispense: 90 tablet; Refill: 1  4. Anxiety Stress management  5. BMI 32.0-32.9,adult Discussed diet and exercise for person with BMI >25 Will recheck weight in 3-6 months  6. Chronic midline low back pain without sciatica Moist heat to back - HYDROcodone-acetaminophen (NORCO) 10-325 MG tablet; Take 1 tablet by mouth 2 (two) times daily.  Dispense: 60 tablet; Refill: 0 - HYDROcodone-acetaminophen (NORCO) 10-325 MG tablet; Take 1 tablet by mouth 2 (two) times daily.  Dispense: 60 tablet; Refill: 0  7. Recurrent major depressive disorder, in full remission ([place]) Stress management  8. Hyperlipidemia with target LDL less than 100 Low fat diet - Lipid  panel  9. Hypokalemia  10. Smoker Smoking cessation    Labs pending Health maintenance reviewed Diet and exercise encouraged Continue all meds Follow up  In 3 month   [place], FNP

## 2016-11-01 NOTE — Patient Instructions (Signed)
Fall Prevention in the Home Introduction Falls can cause injuries. They can happen to people of all ages. There are many things you can do to make your home safe and to help prevent falls. What can I do on the outside of my home?  Regularly fix the edges of walkways and driveways and fix any cracks.  Remove anything that might make you trip as you walk through a door, such as a raised step or threshold.  Trim any bushes or trees on the path to your home.  Use bright outdoor lighting.  Clear any walking paths of anything that might make someone trip, such as rocks or tools.  Regularly check to see if handrails are loose or broken. Make sure that both sides of any steps have handrails.  Any raised decks and porches should have guardrails on the edges.  Have any leaves, snow, or ice cleared regularly.  Use sand or salt on walking paths during winter.  Clean up any spills in your garage right away. This includes oil or grease spills. What can I do in the bathroom?  Use night lights.  Install grab bars by the toilet and in the tub and shower. Do not use towel bars as grab bars.  Use non-skid mats or decals in the tub or shower.  If you need to sit down in the shower, use a plastic, non-slip stool.  Keep the floor dry. Clean up any water that spills on the floor as soon as it happens.  Remove soap buildup in the tub or shower regularly.  Attach bath mats securely with double-sided non-slip rug tape.  Do not have throw rugs and other things on the floor that can make you trip. What can I do in the bedroom?  Use night lights.  Make sure that you have a light by your bed that is easy to reach.  Do not use any sheets or blankets that are too big for your bed. They should not hang down onto the floor.  Have a firm chair that has side arms. You can use this for support while you get dressed.  Do not have throw rugs and other things on the floor that can make you trip. What can  I do in the kitchen?  Clean up any spills right away.  Avoid walking on wet floors.  Keep items that you use a lot in easy-to-reach places.  If you need to reach something above you, use a strong step stool that has a grab bar.  Keep electrical cords out of the way.  Do not use floor polish or wax that makes floors slippery. If you must use wax, use non-skid floor wax.  Do not have throw rugs and other things on the floor that can make you trip. What can I do with my stairs?  Do not leave any items on the stairs.  Make sure that there are handrails on both sides of the stairs and use them. Fix handrails that are broken or loose. Make sure that handrails are as long as the stairways.  Check any carpeting to make sure that it is firmly attached to the stairs. Fix any carpet that is loose or worn.  Avoid having throw rugs at the top or bottom of the stairs. If you do have throw rugs, attach them to the floor with carpet tape.  Make sure that you have a light switch at the top of the stairs and the bottom of the stairs. If you   do not have them, ask someone to add them for you. What else can I do to help prevent falls?  Wear shoes that:  Do not have high heels.  Have rubber bottoms.  Are comfortable and fit you well.  Are closed at the toe. Do not wear sandals.  If you use a stepladder:  Make sure that it is fully opened. Do not climb a closed stepladder.  Make sure that both sides of the stepladder are locked into place.  Ask someone to hold it for you, if possible.  Clearly mark and make sure that you can see:  Any grab bars or handrails.  First and last steps.  Where the edge of each step is.  Use tools that help you move around (mobility aids) if they are needed. These include:  Canes.  Walkers.  Scooters.  Crutches.  Turn on the lights when you go into a dark area. Replace any light bulbs as soon as they burn out.  Set up your furniture so you have a  clear path. Avoid moving your furniture around.  If any of your floors are uneven, fix them.  If there are any pets around you, be aware of where they are.  Review your medicines with your doctor. Some medicines can make you feel dizzy. This can increase your chance of falling. Ask your doctor what other things that you can do to help prevent falls. This information is not intended to replace advice given to you by your health care provider. Make sure you discuss any questions you have with your health care provider. Document Released: 07/16/2009 Document Revised: 02/25/2016 Document Reviewed: 10/24/2014  2017 Elsevier  

## 2016-11-02 LAB — CMP14+EGFR
A/G RATIO: 1.6 (ref 1.2–2.2)
ALBUMIN: 3.9 g/dL (ref 3.6–4.8)
ALK PHOS: 254 IU/L — AB (ref 39–117)
ALT: 31 IU/L (ref 0–32)
AST: 61 IU/L — ABNORMAL HIGH (ref 0–40)
BILIRUBIN TOTAL: 0.6 mg/dL (ref 0.0–1.2)
BUN / CREAT RATIO: 8 — AB (ref 12–28)
BUN: 5 mg/dL — ABNORMAL LOW (ref 8–27)
CHLORIDE: 96 mmol/L (ref 96–106)
CO2: 28 mmol/L (ref 18–29)
Calcium: 9.9 mg/dL (ref 8.7–10.3)
Creatinine, Ser: 0.61 mg/dL (ref 0.57–1.00)
GFR calc Af Amer: 107 mL/min/{1.73_m2} (ref >59)
GFR calc non Af Amer: 93 mL/min/{1.73_m2} (ref >59)
GLOBULIN, TOTAL: 2.5 g/dL (ref 1.5–4.5)
GLUCOSE: 90 mg/dL (ref 65–99)
POTASSIUM: 4.2 mmol/L (ref 3.5–5.2)
SODIUM: 139 mmol/L (ref 134–144)
Total Protein: 6.4 g/dL (ref 6.0–8.5)

## 2016-11-02 LAB — LIPID PANEL
CHOLESTEROL TOTAL: 129 mg/dL (ref 100–199)
Chol/HDL Ratio: 1.9 ratio units (ref 0.0–4.4)
HDL: 68 mg/dL (ref >39)
LDL Calculated: 36 mg/dL (ref 0–99)
Triglycerides: 126 mg/dL (ref 0–149)
VLDL Cholesterol Cal: 25 mg/dL (ref 5–40)

## 2016-11-03 ENCOUNTER — Ambulatory Visit: Payer: Medicare Other | Admitting: Gastroenterology

## 2016-11-03 ENCOUNTER — Other Ambulatory Visit: Payer: Self-pay | Admitting: Nurse Practitioner

## 2016-11-04 ENCOUNTER — Ambulatory Visit (INDEPENDENT_AMBULATORY_CARE_PROVIDER_SITE_OTHER): Payer: Self-pay | Admitting: Orthopedic Surgery

## 2016-11-04 ENCOUNTER — Ambulatory Visit (INDEPENDENT_AMBULATORY_CARE_PROVIDER_SITE_OTHER): Payer: Medicare Other

## 2016-11-04 ENCOUNTER — Other Ambulatory Visit: Payer: Self-pay | Admitting: Nurse Practitioner

## 2016-11-04 ENCOUNTER — Encounter: Payer: Self-pay | Admitting: Orthopedic Surgery

## 2016-11-04 DIAGNOSIS — S82042D Displaced comminuted fracture of left patella, subsequent encounter for closed fracture with routine healing: Secondary | ICD-10-CM | POA: Diagnosis not present

## 2016-11-04 DIAGNOSIS — F419 Anxiety disorder, unspecified: Secondary | ICD-10-CM

## 2016-11-04 MED ORDER — OXYCODONE-ACETAMINOPHEN 5-325 MG PO TABS: 1.0000 | ORAL_TABLET | ORAL | 0 refills | Status: DC | PRN

## 2016-11-04 NOTE — Telephone Encounter (Signed)
Last seen MMM 11/01/16- ch is covering  Route to nurse to phone in if approved

## 2016-11-04 NOTE — Progress Notes (Signed)
Patient ID: [name], female   DOB: Aug 08, 1946, 71 y.o.   MRN: [id number]  Follow up visit/  pov # 1  Chief Complaint  Patient presents with  . Follow-up    ORIF LEFT PATELLA, DOS 10/19/16    [place] controlled substance reporting system reviewed Meds ordered this encounter  Medications  . oxyCODONE-acetaminophen (PERCOCET/ROXICET) 5-325 MG tablet    Sig: Take 1 tablet by mouth every 4 (four) hours as needed for severe pain.    Dispense:  42 tablet    Refill:  0    We took those staples out her wound looks good her knee is nice and straight she is weightbearing with a walker  Encounter Diagnosis  Name Primary?  . Closed displaced comminuted fracture of left patella with routine healing, subsequent encounter Yes   X-ray T score brace next visit There were no vitals taken for this visit.  11:52 AM Arther Abbott, MD 11/04/2016

## 2016-11-07 NOTE — Telephone Encounter (Signed)
Refill called to [facility] 

## 2016-11-07 NOTE — Telephone Encounter (Signed)
Please call in xanax with 1 refills 

## 2016-11-10 ENCOUNTER — Encounter: Payer: Self-pay | Admitting: *Deleted

## 2016-11-16 ENCOUNTER — Telehealth: Payer: Self-pay | Admitting: Orthopedic Surgery

## 2016-11-16 DIAGNOSIS — S82042D Displaced comminuted fracture of left patella, subsequent encounter for closed fracture with routine healing: Secondary | ICD-10-CM

## 2016-11-16 MED ORDER — OXYCODONE-ACETAMINOPHEN 5-325 MG PO TABS: 1.0000 | ORAL_TABLET | ORAL | 0 refills | Status: DC | PRN

## 2016-11-16 NOTE — Telephone Encounter (Signed)
Routing to Dr. Keeling 

## 2016-11-16 NOTE — Telephone Encounter (Signed)
Patient requests a refill on Oxycodone/Acetaminophen  5-325 mgs.   Qty  42       Sig: Take 1 tablet by mouth every 4 (four) hours as needed for severe pain

## 2016-11-17 DIAGNOSIS — S82001A Unspecified fracture of right patella, initial encounter for closed fracture: Secondary | ICD-10-CM | POA: Diagnosis not present

## 2016-11-17 DIAGNOSIS — M25571 Pain in right ankle and joints of right foot: Secondary | ICD-10-CM | POA: Diagnosis not present

## 2016-12-02 ENCOUNTER — Encounter: Payer: Self-pay | Admitting: Orthopedic Surgery

## 2016-12-02 ENCOUNTER — Ambulatory Visit (INDEPENDENT_AMBULATORY_CARE_PROVIDER_SITE_OTHER): Payer: Medicare Other

## 2016-12-02 ENCOUNTER — Ambulatory Visit (INDEPENDENT_AMBULATORY_CARE_PROVIDER_SITE_OTHER): Payer: Self-pay | Admitting: Orthopedic Surgery

## 2016-12-02 DIAGNOSIS — S82042D Displaced comminuted fracture of left patella, subsequent encounter for closed fracture with routine healing: Secondary | ICD-10-CM

## 2016-12-02 MED ORDER — OXYCODONE-ACETAMINOPHEN 5-325 MG PO TABS: 1.0000 | ORAL_TABLET | Freq: Four times a day (QID) | ORAL | 0 refills | Status: DC | PRN

## 2016-12-02 NOTE — Progress Notes (Signed)
Patient ID: [name], female   DOB: 1946/08/22, 71 y.o.   MRN: [id number]  Follow up visit/  open treatment internal fixation   Chief Complaint  Patient presents with  . Follow-up    Post op recheck on left knee, DOS 10-19-16.    Six-week follow-up ORIF left patella  Doing well her straight leg raise without bracing 10 extensor lag.  X-ray shows stable fixation tension band. Encounter Diagnosis  Name Primary?  . Closed displaced comminuted fracture of left patella with routine healing, subsequent encounter Yes    Recommend hinged brace 0-60  Follow-up in 6 weeks for repeat x-ray left knee AP and lateral  10:58 AM Arther Abbott, MD 12/02/2016   Meds ordered this encounter  Medications  . oxyCODONE-acetaminophen (PERCOCET/ROXICET) 5-325 MG tablet    Sig: Take 1 tablet by mouth every 6 (six) hours as needed for severe pain.    Dispense:  28 tablet    Refill:  [place] controlled substance reporting system reviewed

## 2016-12-05 ENCOUNTER — Telehealth: Payer: Self-pay | Admitting: *Deleted

## 2016-12-05 NOTE — Telephone Encounter (Signed)
FYI PHARMACY CALLED TO ADVISE PATIENT FILLED HYDROCODONE 10/325 ON 11/29/16 FROM PCP, FILLED OXYCODONE PRESCRIBED BY DR HARRISON ON 12/02/16, AND IS ATTEMPTING TO FILL ANOTHER OXYCODONE PRESCRIPTION FROM ER FROM 12/03/16

## 2016-12-15 DIAGNOSIS — S82001A Unspecified fracture of right patella, initial encounter for closed fracture: Secondary | ICD-10-CM | POA: Diagnosis not present

## 2016-12-15 DIAGNOSIS — M25571 Pain in right ankle and joints of right foot: Secondary | ICD-10-CM | POA: Diagnosis not present

## 2016-12-22 ENCOUNTER — Telehealth: Payer: Self-pay | Admitting: Orthopedic Surgery

## 2016-12-22 NOTE — Telephone Encounter (Signed)
Patient requests refill on Oxycodone/Acetaminophen 5-325 mgs.   Qty  28  Sig: Take 1 tablet by mouth every 6 (six) hours as needed for severe pain.

## 2016-12-22 NOTE — Telephone Encounter (Signed)
Routing to Dr Harrison for approval 

## 2016-12-23 NOTE — Telephone Encounter (Signed)
Cant fill she has 10 mg hydrocodone on file

## 2016-12-23 NOTE — Telephone Encounter (Signed)
Please advise 

## 2017-01-03 ENCOUNTER — Other Ambulatory Visit: Payer: Self-pay | Admitting: Nurse Practitioner

## 2017-01-03 DIAGNOSIS — F419 Anxiety disorder, unspecified: Secondary | ICD-10-CM

## 2017-01-04 NOTE — Telephone Encounter (Signed)
Forward to MMM

## 2017-01-05 ENCOUNTER — Other Ambulatory Visit: Payer: Self-pay | Admitting: Nurse Practitioner

## 2017-01-05 DIAGNOSIS — F419 Anxiety disorder, unspecified: Secondary | ICD-10-CM

## 2017-01-05 NOTE — Telephone Encounter (Signed)
rx called into pharmacy

## 2017-01-05 NOTE — Telephone Encounter (Signed)
Please call in xanax with 1 refills 

## 2017-01-13 ENCOUNTER — Ambulatory Visit (INDEPENDENT_AMBULATORY_CARE_PROVIDER_SITE_OTHER): Payer: Self-pay | Admitting: Orthopedic Surgery

## 2017-01-13 ENCOUNTER — Ambulatory Visit (INDEPENDENT_AMBULATORY_CARE_PROVIDER_SITE_OTHER): Payer: Medicare Other

## 2017-01-13 ENCOUNTER — Encounter: Payer: Self-pay | Admitting: Orthopedic Surgery

## 2017-01-13 DIAGNOSIS — S82002D Unspecified fracture of left patella, subsequent encounter for closed fracture with routine healing: Secondary | ICD-10-CM

## 2017-01-13 DIAGNOSIS — S82032D Displaced transverse fracture of left patella, subsequent encounter for closed fracture with routine healing: Secondary | ICD-10-CM

## 2017-01-13 NOTE — Patient Instructions (Signed)
Continue bracing, exercise and follow-up in a month

## 2017-01-13 NOTE — Progress Notes (Signed)
Postoperative visit for x-rays left knee  Chief Complaint  Patient presents with  . Follow-up    LEFT PATELLA FRACTURE, DOS 10/19/16    She still has some pain in her patella she's feeling some of her hardware  Her range of motion is active range of motion 15-90 passive range of motion 0-90  X-rays show intact tension band construct  Patient is advised to continue exercising to strengthen the knee continue bracing follow-up one month

## 2017-01-16 ENCOUNTER — Telehealth: Payer: Self-pay | Admitting: Nurse Practitioner

## 2017-01-18 ENCOUNTER — Other Ambulatory Visit: Payer: Self-pay | Admitting: Nurse Practitioner

## 2017-01-18 DIAGNOSIS — I10 Essential (primary) hypertension: Secondary | ICD-10-CM

## 2017-01-18 NOTE — Telephone Encounter (Signed)
appt scheduled

## 2017-01-19 ENCOUNTER — Encounter: Payer: Self-pay | Admitting: Nurse Practitioner

## 2017-01-19 ENCOUNTER — Ambulatory Visit (INDEPENDENT_AMBULATORY_CARE_PROVIDER_SITE_OTHER): Payer: Medicare Other | Admitting: Nurse Practitioner

## 2017-01-19 VITALS — BP 140/78 | HR 86 | Temp 97.8°F | Ht 68.0 in | Wt 176.0 lb

## 2017-01-19 DIAGNOSIS — F17200 Nicotine dependence, unspecified, uncomplicated: Secondary | ICD-10-CM | POA: Diagnosis not present

## 2017-01-19 DIAGNOSIS — E785 Hyperlipidemia, unspecified: Secondary | ICD-10-CM

## 2017-01-19 DIAGNOSIS — F3342 Major depressive disorder, recurrent, in full remission: Secondary | ICD-10-CM | POA: Diagnosis not present

## 2017-01-19 DIAGNOSIS — Z6832 Body mass index (BMI) 32.0-32.9, adult: Secondary | ICD-10-CM | POA: Diagnosis not present

## 2017-01-19 DIAGNOSIS — I10 Essential (primary) hypertension: Secondary | ICD-10-CM | POA: Diagnosis not present

## 2017-01-19 DIAGNOSIS — K219 Gastro-esophageal reflux disease without esophagitis: Secondary | ICD-10-CM | POA: Diagnosis not present

## 2017-01-19 DIAGNOSIS — M545 Low back pain, unspecified: Secondary | ICD-10-CM

## 2017-01-19 DIAGNOSIS — E876 Hypokalemia: Secondary | ICD-10-CM

## 2017-01-19 DIAGNOSIS — F419 Anxiety disorder, unspecified: Secondary | ICD-10-CM

## 2017-01-19 DIAGNOSIS — J449 Chronic obstructive pulmonary disease, unspecified: Secondary | ICD-10-CM | POA: Diagnosis not present

## 2017-01-19 DIAGNOSIS — J209 Acute bronchitis, unspecified: Secondary | ICD-10-CM | POA: Diagnosis not present

## 2017-01-19 DIAGNOSIS — G8929 Other chronic pain: Secondary | ICD-10-CM

## 2017-01-19 MED ORDER — SIMVASTATIN 40 MG PO TABS: 40.0000 mg | ORAL_TABLET | Freq: Every morning | ORAL | 1 refills | Status: DC

## 2017-01-19 MED ORDER — HYDROCODONE-ACETAMINOPHEN 10-325 MG PO TABS: 1.0000 | ORAL_TABLET | Freq: Three times a day (TID) | ORAL | 0 refills | Status: DC

## 2017-01-19 MED ORDER — HYDROCODONE-ACETAMINOPHEN 10-325 MG PO TABS: 1.0000 | ORAL_TABLET | Freq: Two times a day (BID) | ORAL | 0 refills | Status: DC

## 2017-01-19 MED ORDER — FLUTICASONE PROPIONATE 50 MCG/ACT NA SUSP: 2.0000 | Freq: Every day | NASAL | 2 refills | Status: DC

## 2017-01-19 MED ORDER — ALPRAZOLAM 1 MG PO TABS: ORAL_TABLET | ORAL | 1 refills | Status: DC

## 2017-01-19 MED ORDER — PAROXETINE HCL 20 MG PO TABS: 20.0000 mg | ORAL_TABLET | Freq: Every day | ORAL | 1 refills | Status: DC

## 2017-01-19 MED ORDER — PANTOPRAZOLE SODIUM 40 MG PO TBEC: 40.0000 mg | DELAYED_RELEASE_TABLET | Freq: Every day | ORAL | 1 refills | Status: DC

## 2017-01-19 MED ORDER — LISINOPRIL-HYDROCHLOROTHIAZIDE 10-12.5 MG PO TABS: 1.0000 | ORAL_TABLET | Freq: Every day | ORAL | 0 refills | Status: DC

## 2017-01-19 NOTE — Patient Instructions (Signed)
Fall Prevention in the Home Falls can cause injuries and can affect people from all age groups. There are many simple things that you can do to make your home safe and to help prevent falls. What can I do on the outside of my home?  Regularly repair the edges of walkways and driveways and fix any cracks.  Remove high doorway thresholds.  Trim any shrubbery on the main path into your home.  Use bright outdoor lighting.  Clear walkways of debris and clutter, including tools and rocks.  Regularly check that handrails are securely fastened and in good repair. Both sides of any steps should have handrails.  Install guardrails along the edges of any raised decks or porches.  Have leaves, snow, and ice cleared regularly.  Use sand or salt on walkways during winter months.  In the garage, clean up any spills right away, including grease or oil spills. What can I do in the bathroom?  Use night lights.  Install grab bars by the toilet and in the tub and shower. Do not use towel bars as grab bars.  Use non-skid mats or decals on the floor of the tub or shower.  If you need to sit down while you are in the shower, use a plastic, non-slip stool.  Keep the floor dry. Immediately clean up any water that spills on the floor.  Remove soap buildup in the tub or shower on a regular basis.  Attach bath mats securely with double-sided non-slip rug tape.  Remove throw rugs and other tripping hazards from the floor. What can I do in the bedroom?  Use night lights.  Make sure that a bedside light is easy to reach.  Do not use oversized bedding that drapes onto the floor.  Have a firm chair that has side arms to use for getting dressed.  Remove throw rugs and other tripping hazards from the floor. What can I do in the kitchen?  Clean up any spills right away.  Avoid walking on wet floors.  Place frequently used items in easy-to-reach places.  If you need to reach for something above  you, use a sturdy step stool that has a grab bar.  Keep electrical cables out of the way.  Do not use floor polish or wax that makes floors slippery. If you have to use wax, make sure that it is non-skid floor wax.  Remove throw rugs and other tripping hazards from the floor. What can I do in the stairways?  Do not leave any items on the stairs.  Make sure that there are handrails on both sides of the stairs. Fix handrails that are broken or loose. Make sure that handrails are as long as the stairways.  Check any carpeting to make sure that it is firmly attached to the stairs. Fix any carpet that is loose or worn.  Avoid having throw rugs at the top or bottom of stairways, or secure the rugs with carpet tape to prevent them from moving.  Make sure that you have a light switch at the top of the stairs and the bottom of the stairs. If you do not have them, have them installed. What are some other fall prevention tips?  Wear closed-toe shoes that fit well and support your feet. Wear shoes that have rubber soles or low heels.  When you use a stepladder, make sure that it is completely opened and that the sides are firmly locked. Have someone hold the ladder while you are using   it. Do not climb a closed stepladder.  Add color or contrast paint or tape to grab bars and handrails in your home. Place contrasting color strips on the first and last steps.  Use mobility aids as needed, such as canes, walkers, scooters, and crutches.  Turn on lights if it is dark. Replace any light bulbs that burn out.  Set up furniture so that there are clear paths. Keep the furniture in the same spot.  Fix any uneven floor surfaces.  Choose a carpet design that does not hide the edge of steps of a stairway.  Be aware of any and all pets.  Review your medicines with your healthcare provider. Some medicines can cause dizziness or changes in blood pressure, which increase your risk of falling. Talk with  your health care provider about other ways that you can decrease your risk of falls. This may include working with a physical therapist or trainer to improve your strength, balance, and endurance. This information is not intended to replace advice given to you by your health care provider. Make sure you discuss any questions you have with your health care provider. Document Released: 09/09/2002 Document Revised: 02/16/2016 Document Reviewed: 10/24/2014 Elsevier Interactive Patient Education  2017 [employer]  

## 2017-01-19 NOTE — Progress Notes (Signed)
Subjective:    Patient ID: [name], female    DOB: [birth year], 71 y.o.   MRN: [id number]  HPI  [name] is here today for follow up of chronic medical problem.  Outpatient Encounter Prescriptions as of 01/19/2017  Medication Sig  . ALPRAZolam (XANAX) 1 MG tablet TAKE 1 TABLET BY MOUTH THREE TIMES A DAY AS NEEDED FOR ANXIETY  . Aspirin-Acetaminophen-Caffeine (GOODYS EXTRA STRENGTH) 500-325-65 MG PACK Take 1 packet by mouth every 8 (eight) hours as needed (for pain.).  Marland Kitchen aspirin-sod bicarb-citric acid (ALKA-SELTZER) 325 MG TBEF tablet Take 325 mg by mouth every 6 (six) hours as needed (cold/congestion).  . calcium carbonate (OS-CAL) 600 MG TABS Take 600 mg by mouth daily.   . Cholecalciferol (VITAMIN D3) 1000 UNITS CAPS Take 3,000 mg by mouth daily.   Marland Kitchen dicyclomine (BENTYL) 10 MG capsule Take 1 capsule (10 mg total) by mouth 4 (four) times daily as needed for spasms. (Patient taking differently: Take 10 mg by mouth 4 (four) times daily as needed (for stomach spasms.). )  . diphenhydrAMINE (BENADRYL) 25 MG tablet Take 25 mg by mouth 2 (two) times daily as needed. For Allergies  . fluticasone (FLONASE) 50 MCG/ACT nasal spray PLACE 2 SPRAYS INTO BOTH NOSTRILS DAILY.  Marland Kitchen HYDROcodone-acetaminophen (NORCO) 10-325 MG tablet Take 1 tablet by mouth 2 (two) times daily.  Marland Kitchen lisinopril-hydrochlorothiazide (PRINZIDE,ZESTORETIC) 10-12.5 MG tablet TAKE 1 TABLET BY MOUTH DAILY.  . Multiple Vitamin (MULITIVITAMIN WITH MINERALS) TABS Take 1 tablet by mouth daily.  . pantoprazole (PROTONIX) 40 MG tablet Take 1 tablet (40 mg total) by mouth daily.  Marland Kitchen PARoxetine (PAXIL) 20 MG tablet Take 1 tablet (20 mg total) by mouth daily.  Marland Kitchen PROAIR HFA 108 (90 Base) MCG/ACT inhaler INHALE 2 PUFFS INTO THE LUNGS EVERY 6 (SIX) HOURS AS NEEDED FOR WHEEZING OR SHORTNESS OF BREATH.  . simvastatin (ZOCOR) 40 MG tablet TAKE 1 TABLET BY MOUTH AT BEEDTIME (Patient taking differently: Take 40 mg by mouth every  morning. TAKE 1 TABLET BY MOUTH AT BEEDTIME)  . vitamin C (ASCORBIC ACID) 500 MG tablet Take 1,000 mg by mouth daily.    1. Benign hypertension  No c/o chest pain,SOB or HA- does not check blood pressures at home  2. COPD (chronic obstructive pulmonary disease) with chronic bronchitis (HCC)  Not on any maintenance inhalers- has not needed albuterol in the last month or so  3. Gastroesophageal reflux disease without esophagitis  Takes her protonix daily- has not had any recent flare up of symptoms  4. Anxiety  Stays anxious- xanax TID  5. BMI 32.0-32.9,adult  No recent weight gain or weight loss  6. Chronic midline low back pain without sciatica  Has chronic back pain- rates 5-8/10-never completely resolves  7. Recurrent major depressive disorder, in full remission ([place])  On paxil- working okay Depression screen [facility] 2/9 01/19/2017 11/01/2016 07/28/2016 06/24/2016 04/25/2016  Decreased Interest 0 0 0 0 0  Down, Depressed, Hopeless 0 0 0 0 0  PHQ - 2 Score 0 0 0 0 0  Altered sleeping - - - - -  Tired, decreased energy - - - - -  Change in appetite - - - - -  Feeling bad or failure about yourself  - - - - -  Trouble concentrating - - - - -  Moving slowly or fidgety/restless - - - - -  Suicidal thoughts - - - - -  PHQ-9 Score - - - - -  Difficult doing  work/chores - - - - -     8. Hyperlipidemia with target LDL less than 100  Does not watch diet- very seldom exercises  9. Hypokalemia  No lower ext cramps  10. Smoker  Has no desire to quit- smokes about 1 pack a day    New complaints: None today     Review of Systems  Constitutional: Negative for diaphoresis.  Eyes: Negative for pain.  Respiratory: Negative for shortness of breath.   Cardiovascular: Negative for chest pain, palpitations and leg swelling.  Gastrointestinal: Negative for abdominal pain.  Endocrine: Negative for polydipsia.  Skin: Negative for rash.  Neurological: Negative for dizziness, weakness and  headaches.  Hematological: Does not bruise/bleed easily.       Objective:   Physical Exam  Constitutional: She is oriented to person, place, and time. She appears well-developed and well-nourished.  HENT:  Nose: Nose normal.  Mouth/Throat: Oropharynx is clear and moist.  Eyes: EOM are normal.  Neck: Trachea normal, normal range of motion and full passive range of motion without pain. Neck supple. No JVD present. Carotid bruit is not present. No thyromegaly present.  Cardiovascular: Normal rate, regular rhythm, normal heart sounds and intact distal pulses.  Exam reveals no gallop and no friction rub.   No murmur heard. Pulmonary/Chest: Effort normal. She has wheezes (exp wheezes bil bases).  Abdominal: Soft. Bowel sounds are normal. She exhibits no distension and no mass. There is no tenderness.  Musculoskeletal: Normal range of motion.  Knee brae on place from patella fracture  Lymphadenopathy:    She has no cervical adenopathy.  Neurological: She is alert and oriented to person, place, and time. She has normal reflexes.  Skin: Skin is warm and dry.  Psychiatric: She has a normal mood and affect. Her behavior is normal. Judgment and thought content normal.    BP 140/78   Pulse 86   Temp 97.8 F (36.6 C) (Oral)   Ht '5\' 8"'$  (1.727 m)   Wt 176 lb (79.8 kg)   BMI 26.76 kg/m        Assessment & Plan:  1. Benign hypertension Low sodium diet - lisinopril-hydrochlorothiazide (PRINZIDE,ZESTORETIC) 10-12.5 MG tablet; Take 1 tablet by mouth daily.  Dispense: 90 tablet; Refill: 0 - CMP14+EGFR  2. COPD (chronic obstructive pulmonary disease) with chronic bronchitis (HCC) Stop smoking  3. Gastroesophageal reflux disease without esophagitis Avoid spicy foods Do not eat 2 hours prior to bedtime - pantoprazole (PROTONIX) 40 MG tablet; Take 1 tablet (40 mg total) by mouth daily.  Dispense: 90 tablet; Refill: 1  4. Anxiety Stress management - ALPRAZolam (XANAX) 1 MG tablet; TAKE 1  TABLET BY MOUTH THREE TIMES A DAY AS NEEDED FOR ANXIETY  Dispense: 90 tablet; Refill: 1  5. BMI 32.0-32.9,adult Discussed diet and exercise for person with BMI >25 Will recheck weight in 3-6 months  6. Chronic midline low back pain without sciatica Would like for her to cut back on pain meds but patella fracture is really bithering her - HYDROcodone-acetaminophen (NORCO) 10-325 MG tablet; Take 1 tablet by mouth 3 (three) times daily.  Dispense: 90 tablet; Refill: 0 - HYDROcodone-acetaminophen (NORCO) 10-325 MG tablet; Take 1 tablet by mouth 3 (three) times daily.  Dispense: 90 tablet; Refill: 0 - HYDROcodone-acetaminophen (NORCO) 10-325 MG tablet; Take 1 tablet by mouth 3 (three) times daily.  Dispense: 90 tablet; Refill: 0  7. Recurrent major depressive disorder, in full remission ([place]) Stress management - PARoxetine (PAXIL) 20 MG tablet; Take 1 tablet (  20 mg total) by mouth daily.  Dispense: 90 tablet; Refill: 1  8. Hyperlipidemia with target LDL less than 100 Low fat diet - simvastatin (ZOCOR) 40 MG tablet; Take 1 tablet (40 mg total) by mouth every morning. TAKE 1 TABLET BY MOUTH AT BEEDTIME  Dispense: 90 tablet; Refill: 1 - Lipid panel  9. Hypokalemia  10. Smoker Smoking cessation discussed  fluticasone (FLONASE) 50 MCG/ACT nasal spray; Place 2 sprays into both nostrils daily.  Dispense: 16 g; Refill: 2    Labs pending Health maintenance reviewed Diet and exercise encouraged Continue all meds Follow up  In 3 months   [place], FNP

## 2017-01-20 LAB — CMP14+EGFR
A/G RATIO: 1.9 (ref 1.2–2.2)
ALBUMIN: 4 g/dL (ref 3.6–4.8)
ALT: 7 IU/L (ref 0–32)
AST: 11 IU/L (ref 0–40)
Alkaline Phosphatase: 83 IU/L (ref 39–117)
BILIRUBIN TOTAL: 0.3 mg/dL (ref 0.0–1.2)
BUN / CREAT RATIO: 10 — AB (ref 12–28)
BUN: 5 mg/dL — ABNORMAL LOW (ref 8–27)
CALCIUM: 9.7 mg/dL (ref 8.7–10.3)
CHLORIDE: 96 mmol/L (ref 96–106)
CO2: 30 mmol/L — ABNORMAL HIGH (ref 18–29)
Creatinine, Ser: 0.51 mg/dL — ABNORMAL LOW (ref 0.57–1.00)
GFR, EST AFRICAN AMERICAN: 113 mL/min/{1.73_m2} (ref >59)
GFR, EST NON AFRICAN AMERICAN: 98 mL/min/{1.73_m2} (ref >59)
GLOBULIN, TOTAL: 2.1 g/dL (ref 1.5–4.5)
Glucose: 87 mg/dL (ref 65–99)
POTASSIUM: 4.3 mmol/L (ref 3.5–5.2)
Sodium: 140 mmol/L (ref 134–144)
Total Protein: 6.1 g/dL (ref 6.0–8.5)

## 2017-01-20 LAB — LIPID PANEL
CHOL/HDL RATIO: 2 ratio (ref 0.0–4.4)
Cholesterol, Total: 145 mg/dL (ref 100–199)
HDL: 74 mg/dL (ref >39)
LDL Calculated: 53 mg/dL (ref 0–99)
Triglycerides: 92 mg/dL (ref 0–149)
VLDL Cholesterol Cal: 18 mg/dL (ref 5–40)

## 2017-01-22 ENCOUNTER — Other Ambulatory Visit: Payer: Self-pay | Admitting: Nurse Practitioner

## 2017-01-22 DIAGNOSIS — K529 Noninfective gastroenteritis and colitis, unspecified: Secondary | ICD-10-CM

## 2017-01-26 ENCOUNTER — Ambulatory Visit (INDEPENDENT_AMBULATORY_CARE_PROVIDER_SITE_OTHER): Payer: Medicare Other | Admitting: *Deleted

## 2017-01-26 VITALS — BP 139/80 | HR 70 | Temp 98.1°F | Ht 68.0 in | Wt 177.0 lb

## 2017-01-26 DIAGNOSIS — Z23 Encounter for immunization: Secondary | ICD-10-CM

## 2017-01-26 DIAGNOSIS — Z0000 Encounter for general adult medical examination without abnormal findings: Secondary | ICD-10-CM

## 2017-01-26 NOTE — Progress Notes (Signed)
Subjective:   [name] is a 71 y.o. female who presents for an Initial Medicare Annual Wellness Visit.  Patient is retired from [employer]. She likes to plant flowers and watch TV. She tries to walk to the mailbox everyday and that is about 1/4 mile. She never eats breakfast. She has IBS and only eats around lunch after all of her errands are run for the day. She does eat a balanced lunch and dinner. She does attend church, but is not involved in any other community events. She lives in [place], a small apartment for the [place] on low income. She lost her husband almost 2 years ago to cancer. She has five children, all local. She has 15 grandchildren and keeps them very often. She does not have any pets. She lives on the ground floor. She states her health is the same as it was last year at this time.        Objective:    Today's Vitals   01/26/17 1100  BP: 139/80  Pulse: 70  Temp: 98.1 F (36.7 C)  TempSrc: Oral  Weight: 177 lb (80.3 kg)  Height: 5\' 8"  (1.727 m)   Body mass index is 26.91 kg/m.   Current Medications (verified) Outpatient Encounter Prescriptions as of 01/26/2017  Medication Sig  . ALPRAZolam (XANAX) 1 MG tablet TAKE 1 TABLET BY MOUTH THREE TIMES A DAY AS NEEDED FOR ANXIETY  . Aspirin-Acetaminophen-Caffeine (GOODYS EXTRA STRENGTH) 500-325-65 MG PACK Take 1 packet by mouth every 8 (eight) hours as needed (for pain.).  Marland Kitchen aspirin-sod bicarb-citric acid (ALKA-SELTZER) 325 MG TBEF tablet Take 325 mg by mouth every 6 (six) hours as needed (cold/congestion).  . calcium carbonate (OS-CAL) 600 MG TABS Take 600 mg by mouth daily.   . Cholecalciferol (VITAMIN D3) 1000 UNITS CAPS Take 3,000 mg by mouth daily.   Marland Kitchen dicyclomine (BENTYL) 10 MG capsule TAKE 1 CAPSULE (10 MG TOTAL) BY MOUTH 4 (FOUR) TIMES DAILY AS NEEDED FOR SPASMS.  Marland Kitchen diphenhydrAMINE (BENADRYL) 25 MG tablet Take 25 mg by mouth 2 (two) times daily as needed. For Allergies  . fluticasone (FLONASE)  50 MCG/ACT nasal spray Place 2 sprays into both nostrils daily.  Marland Kitchen HYDROcodone-acetaminophen (NORCO) 10-325 MG tablet Take 1 tablet by mouth 3 (three) times daily.  Marland Kitchen lisinopril-hydrochlorothiazide (PRINZIDE,ZESTORETIC) 10-12.5 MG tablet Take 1 tablet by mouth daily.  . Multiple Vitamin (MULITIVITAMIN WITH MINERALS) TABS Take 1 tablet by mouth daily.  . pantoprazole (PROTONIX) 40 MG tablet Take 1 tablet (40 mg total) by mouth daily.  Marland Kitchen PARoxetine (PAXIL) 20 MG tablet Take 1 tablet (20 mg total) by mouth daily.  Marland Kitchen PROAIR HFA 108 (90 Base) MCG/ACT inhaler INHALE 2 PUFFS INTO THE LUNGS EVERY 6 (SIX) HOURS AS NEEDED FOR WHEEZING OR SHORTNESS OF BREATH.  . simvastatin (ZOCOR) 40 MG tablet Take 1 tablet (40 mg total) by mouth every morning. TAKE 1 TABLET BY MOUTH AT BEEDTIME  . vitamin C (ASCORBIC ACID) 500 MG tablet Take 1,000 mg by mouth daily.   Marland Kitchen HYDROcodone-acetaminophen (NORCO) 10-325 MG tablet Take 1 tablet by mouth 3 (three) times daily.  Marland Kitchen HYDROcodone-acetaminophen (NORCO) 10-325 MG tablet Take 1 tablet by mouth 3 (three) times daily.   No facility-administered encounter medications on file as of 01/26/2017.     Allergies (verified) Meloxicam   History: Past Medical History:  Diagnosis Date  . Angina    with indigestion  . Anxiety   . Arthritis    all over esp.  in joints  . Complication of anesthesia   . COPD (chronic obstructive pulmonary disease) ([place])   . Depression   . Family history of adverse reaction to anesthesia   . GERD (gastroesophageal reflux disease)   . Headache(784.0)    normal h/a  . HPV in female   . HTN (hypertension)   . Hypercholesteremia   . Hypercholesterolemia   . IBS (irritable bowel syndrome)   . Pneumonia 2007  . PONV (postoperative nausea and vomiting)   . S/P colonoscopy 05/30/2008   Dr. Deatra Ina: (records reviewed by AS, NP) findings normal to cecum, scattered diverticula in sigmoid colon   . S/P endoscopy 05/15/2008   Dr. Deatra Ina: (records  reviewed by AS,NP): [facility] dilator for stricture distal esophagus, also noted duodenal stricture, op notes state unable to pass 33mm scope through pylorus  . Shortness of breath    with exertion  . Stomach ulcer    Past Surgical History:  Procedure Laterality Date  . APPENDECTOMY     71 years old  . BACK SURGERY     X 2  . BIOPSY  08/11/2016   Procedure: BIOPSY;  Surgeon: Daneil Dolin, MD;  [facility];  Service: Endoscopy;;  gastric bx's  . BREAST SURGERY     biopsy only  . CARPAL TUNNEL RELEASE     right hand  . CERVICAL BIOPSY  W/ LOOP ELECTRODE EXCISION  02/2011   CIN-2 with focal ectocervical margin involvement. With CIN-1 ECC negative  . CHOLECYSTECTOMY  1984  . ESOPHAGOGASTRODUODENOSCOPY  12/01/2011   Hiatal hernia/Mild fibrotic pyloric stenosis status post dilation with passage of  the scope.  The previously noted gastric ulcer completely healed  The remainder of the gastric mucosa appeared normal  . ESOPHAGOGASTRODUODENOSCOPY (EGD) WITH PROPOFOL N/A 08/11/2016   Procedure: ESOPHAGOGASTRODUODENOSCOPY (EGD) WITH PROPOFOL;  Surgeon: Daneil Dolin, MD;  [facility];  Service: Endoscopy;  Laterality: N/A;  2:15 PM  . FRACTURE SURGERY  10/18/2016   left knee in 3 places  . MALONEY DILATION  07/28/2011   Schatzki's ring s/p 66F with small UES tear as well, small hh, 1cm pyloric channel ulcer, bx benign without H.Pylori. Mobic/goody's at time.  Marland Kitchen MALONEY DILATION N/A 08/11/2016   Procedure: Venia Minks DILATION;  Surgeon: Daneil Dolin, MD;  [facility];  Service: Endoscopy;  Laterality: N/A;  . ORIF PATELLA Left 10/19/2016   Procedure: OPEN REDUCTION INTERNAL (ORIF) FIXATION PATELLA;  Surgeon: Carole Civil, MD;  Location: [facility];  Service: Orthopedics;  Laterality: Left;  . SPINE SURGERY  2009   cervical disc  . SPINE SURGERY  2009   lumbar  . TUBAL LIGATION  1976  . tumor removed from arm     Family History  Problem Relation Age of  Onset  . Heart disease Father     CHF  . Diabetes Sister   . Heart disease Mother   . COPD Sister   . COPD Sister   . COPD Sister   . Stroke Brother   . Colon cancer Neg Hx   . Anesthesia problems Neg Hx   . Hypotension Neg Hx   . Malignant hyperthermia Neg Hx   . Pseudochol deficiency Neg Hx   . Colon polyps Neg Hx    Social History   Occupational History  . disability Disability   Social History Main Topics  . Smoking status: Current Every Day Smoker    Packs/day: 1.00    Years: 50.00  Types: Cigarettes  . Smokeless tobacco: Never Used     Comment: since teenager  . Alcohol use No     Comment: hx of ETOH abuse in past, none in 10 years  . Drug use: No  . Sexual activity: No    Tobacco Counseling Ready to quit: Not Answered Counseling given: Not Answered   she does smoke and is not ready to quit. Activities of Daily Living In your present state of health, do you have any difficulty performing the following activities: 01/26/2017 10/19/2016  Hearing? N N  Vision? Y N  Difficulty concentrating or making decisions? N N  Walking or climbing stairs? N Y  Dressing or bathing? N N  Doing errands, shopping? N N  Some recent data might be hidden   She wears glasses for close up Immunizations and Health Maintenance Immunization History  Administered Date(s) Administered  . Influenza Split 11/27/2012  . Influenza, High Dose Seasonal PF 07/01/2014, 08/12/2015  . Influenza,inj,Quad PF,36+ Mos 06/24/2013  . Pneumococcal Conjugate-13 06/24/2014  . Pneumococcal Polysaccharide-23 11/27/2012, 08/12/2015   Health Maintenance Due  Topic Date Due  . COLON CANCER SCREENING ANNUAL FOBT  01/26/2017    Patient Care Team: Chevis Pretty, FNP as PCP - General (Nurse Practitioner) Daneil Dolin, MD (Gastroenterology)  Indicate any recent Medical Services you may have received from other than [facility] providers in the past year (date may be approximate).     Assessment:     This is a routine wellness examination for [facility]  Hearing/Vision screen No exam data present Wears glasses, no hearing problems Dietary issues and exercise activities discussed:    Goals    . Eat breakfast    . Increase physical activity      Depression Screen PHQ 2/9 Scores 01/26/2017 01/19/2017 11/01/2016 07/28/2016 06/24/2016 04/25/2016 04/08/2016  PHQ - 2 Score 1 0 0 0 0 0 -  PHQ- 9 Score - - - - - - -  Exception Documentation - - - - - - Patient refusal    Fall Risk Fall Risk  01/26/2017 01/19/2017 11/01/2016 07/28/2016 06/24/2016  Falls in the past year? Yes Yes Yes No No  Number falls in past yr: - 1 1 - -  Injury with Fall? Yes Yes Yes - -    Cognitive Function: MMSE - Mini Mental State Exam 01/26/2017  Orientation to time 5  Orientation to Place 5  Registration 3  Attention/ Calculation 0  Recall 1  Language- name 2 objects 2  Language- repeat 1  Language- follow 3 step command 3  Language- read & follow direction 1  Write a sentence 1  Copy design 1  Total score 23      scored 23/30  Screening Tests Health Maintenance  Topic Date Due  . COLON CANCER SCREENING ANNUAL FOBT  01/26/2017  . TETANUS/TDAP  05/01/2017 (Originally 06/04/2011)  . INFLUENZA VACCINE  05/03/2017  . MAMMOGRAM  07/19/2018  . DEXA SCAN  Completed  . Hepatitis C Screening  Completed  . PNA vac Low Risk Adult  Completed      Plan:   She has an appointment in July 2018, at that time we will schedule a pap with MMM, discuss shingles shot again and cost. Tdap was given today. Handicap form filled out and given to pt  I have personally reviewed and noted the following in the patient's chart:   . Medical and social history . Use of alcohol, tobacco or illicit drugs  . Current medications and  supplements . Functional ability and status . Nutritional status . Physical activity . Advanced directives . List of other physicians . Hospitalizations, surgeries, and ER visits in previous 12  months . Vitals . Screenings to include cognitive, depression, and falls . Referrals and appointments  In addition, I have reviewed and discussed with patient certain preventive protocols, quality metrics, and best practice recommendations. A written personalized care plan for preventive services as well as general preventive health recommendations were provided to patient.     Rana Snare, LPN   8/82/8003   I have reviewed and agree with the above AWV documentation.   [name], FNP

## 2017-01-26 NOTE — Patient Instructions (Signed)
  Ms. Monica , Thank you for taking time to come for your Medicare Wellness Visit. I appreciate your ongoing commitment to your health goals. Please review the following plan we discussed and let me know if I can assist you in the future.   These are the goals we discussed: Goals    . Eat breakfast    . Increase physical activity       This is a list of the screening recommended for you and due dates:  Health Maintenance  Topic Date Due  . Stool Blood Test  01/26/2017  . Tetanus Vaccine  05/01/2017*  . Flu Shot  05/03/2017  . Mammogram  07/19/2018  . DEXA scan (bone density measurement)  Completed  .  Hepatitis C: One time screening is recommended by Center for Disease Control  (CDC) for  adults born from [birth year] through 1964.   Completed  . Pneumonia vaccines  Completed  *Topic was postponed. The date shown is not the original due date.

## 2017-01-30 ENCOUNTER — Ambulatory Visit: Payer: Medicare Other | Admitting: Nurse Practitioner

## 2017-02-15 ENCOUNTER — Other Ambulatory Visit: Payer: Self-pay | Admitting: Nurse Practitioner

## 2017-02-15 ENCOUNTER — Other Ambulatory Visit: Payer: Self-pay | Admitting: Internal Medicine

## 2017-02-15 DIAGNOSIS — I10 Essential (primary) hypertension: Secondary | ICD-10-CM

## 2017-02-15 DIAGNOSIS — J441 Chronic obstructive pulmonary disease with (acute) exacerbation: Secondary | ICD-10-CM

## 2017-02-15 DIAGNOSIS — E785 Hyperlipidemia, unspecified: Secondary | ICD-10-CM

## 2017-02-15 DIAGNOSIS — J209 Acute bronchitis, unspecified: Secondary | ICD-10-CM

## 2017-02-15 DIAGNOSIS — K219 Gastro-esophageal reflux disease without esophagitis: Secondary | ICD-10-CM

## 2017-02-15 DIAGNOSIS — F3342 Major depressive disorder, recurrent, in full remission: Secondary | ICD-10-CM

## 2017-02-15 DIAGNOSIS — F419 Anxiety disorder, unspecified: Secondary | ICD-10-CM

## 2017-02-15 NOTE — Telephone Encounter (Signed)
Please call in xanax with 1 refills 

## 2017-02-16 NOTE — Telephone Encounter (Signed)
Called to [facility] 

## 2017-02-24 ENCOUNTER — Ambulatory Visit (INDEPENDENT_AMBULATORY_CARE_PROVIDER_SITE_OTHER): Payer: Medicare Other | Admitting: Orthopedic Surgery

## 2017-02-24 ENCOUNTER — Encounter: Payer: Self-pay | Admitting: Orthopedic Surgery

## 2017-02-24 DIAGNOSIS — Z4889 Encounter for other specified surgical aftercare: Secondary | ICD-10-CM

## 2017-02-24 DIAGNOSIS — S82042D Displaced comminuted fracture of left patella, subsequent encounter for closed fracture with routine healing: Secondary | ICD-10-CM

## 2017-02-24 NOTE — Progress Notes (Signed)
Progress Note   Patient ID: [name], female   DOB: 06/28/1946, 71 y.o.   MRN: [id number]  Chief Complaint  Patient presents with  . Follow-up    LEFT PATELLA, DOS 10/19/16    HPI [name] is a 71 y.o. female.   71 year old female 4 months after patellar fracture with internal fixation. She is doing well. She is walking now without any assistive devices. She has used her brace sparingly       Review of Systems Review of Systems Normal neuro  Denies fever  Examination There were no vitals taken for this visit.  Gen. appearance the patient's appearance is normal with normal grooming and  hygiene The patient is oriented to person place and time Mood and affect are normal   Ortho Exam Stability tests are normal  Motor exam 5/5 manual muscle testing , no atrophy  Skin is normal (no rash or erythema)  Full extension her incision healed nicely. Her flexion is 110.  Medical decision-making Diagnosis, Data, Plan (risk)  Encounter Diagnoses  Name Primary?  Marland Kitchen Aftercare following surgery Yes  . Closed displaced comminuted fracture of left patella with routine healing, subsequent encounter     Follow-up as needed. We can remove the hardware if it becomes problematic Arther Abbott, MD 02/24/2017 11:29 AM

## 2017-04-03 ENCOUNTER — Other Ambulatory Visit: Payer: Self-pay | Admitting: Nurse Practitioner

## 2017-04-03 DIAGNOSIS — F419 Anxiety disorder, unspecified: Secondary | ICD-10-CM

## 2017-04-20 ENCOUNTER — Encounter: Payer: Self-pay | Admitting: Nurse Practitioner

## 2017-04-20 ENCOUNTER — Ambulatory Visit (INDEPENDENT_AMBULATORY_CARE_PROVIDER_SITE_OTHER): Payer: Medicare Other | Admitting: Nurse Practitioner

## 2017-04-20 ENCOUNTER — Other Ambulatory Visit: Payer: Self-pay | Admitting: Nurse Practitioner

## 2017-04-20 VITALS — BP 122/64 | HR 71 | Temp 98.0°F | Ht 68.0 in | Wt 175.0 lb

## 2017-04-20 DIAGNOSIS — Z6832 Body mass index (BMI) 32.0-32.9, adult: Secondary | ICD-10-CM

## 2017-04-20 DIAGNOSIS — F419 Anxiety disorder, unspecified: Secondary | ICD-10-CM

## 2017-04-20 DIAGNOSIS — I10 Essential (primary) hypertension: Secondary | ICD-10-CM | POA: Diagnosis not present

## 2017-04-20 DIAGNOSIS — M545 Low back pain, unspecified: Secondary | ICD-10-CM

## 2017-04-20 DIAGNOSIS — E876 Hypokalemia: Secondary | ICD-10-CM

## 2017-04-20 DIAGNOSIS — G8929 Other chronic pain: Secondary | ICD-10-CM

## 2017-04-20 DIAGNOSIS — J449 Chronic obstructive pulmonary disease, unspecified: Secondary | ICD-10-CM | POA: Diagnosis not present

## 2017-04-20 DIAGNOSIS — F3342 Major depressive disorder, recurrent, in full remission: Secondary | ICD-10-CM

## 2017-04-20 DIAGNOSIS — K219 Gastro-esophageal reflux disease without esophagitis: Secondary | ICD-10-CM

## 2017-04-20 DIAGNOSIS — F17200 Nicotine dependence, unspecified, uncomplicated: Secondary | ICD-10-CM | POA: Diagnosis not present

## 2017-04-20 DIAGNOSIS — J209 Acute bronchitis, unspecified: Secondary | ICD-10-CM

## 2017-04-20 DIAGNOSIS — E785 Hyperlipidemia, unspecified: Secondary | ICD-10-CM | POA: Diagnosis not present

## 2017-04-20 DIAGNOSIS — J441 Chronic obstructive pulmonary disease with (acute) exacerbation: Secondary | ICD-10-CM

## 2017-04-20 MED ORDER — ALPRAZOLAM 1 MG PO TABS: ORAL_TABLET | ORAL | 1 refills | Status: DC

## 2017-04-20 MED ORDER — HYDROCODONE-ACETAMINOPHEN 10-325 MG PO TABS: 1.0000 | ORAL_TABLET | Freq: Three times a day (TID) | ORAL | 0 refills | Status: DC

## 2017-04-20 NOTE — Patient Instructions (Signed)
Stress and Stress Management Stress is a normal reaction to life events. It is what you feel when life demands more than you are used to or more than you can handle. Some stress can be useful. For example, the stress reaction can help you catch the last bus of the day, study for a test, or meet a deadline at work. But stress that occurs too often or for too long can cause problems. It can affect your emotional health and interfere with relationships and normal daily activities. Too much stress can weaken your immune system and increase your risk for physical illness. If you already have a medical problem, stress can make it worse. What are the causes? All sorts of life events may cause stress. An event that causes stress for one person may not be stressful for another person. Major life events commonly cause stress. These may be positive or negative. Examples include losing your job, moving into a new home, getting married, having a baby, or losing a loved one. Less obvious life events may also cause stress, especially if they occur day after day or in combination. Examples include working long hours, driving in traffic, caring for children, being in debt, or being in a difficult relationship. What are the signs or symptoms? Stress may cause emotional symptoms including, the following:  Anxiety. This is feeling worried, afraid, on edge, overwhelmed, or out of control.  Anger. This is feeling irritated or impatient.  Depression. This is feeling sad, down, helpless, or guilty.  Difficulty focusing, remembering, or making decisions.  Stress may cause physical symptoms, including the following:  Aches and pains. These may affect your head, neck, back, stomach, or other areas of your body.  Tight muscles or clenched jaw.  Low energy or trouble sleeping.  Stress may cause unhealthy behaviors, including the following:  Eating to feel better (overeating) or skipping meals.  Sleeping too little,  too much, or both.  Working too much or putting off tasks (procrastination).  Smoking, drinking alcohol, or using drugs to feel better.  How is this diagnosed? Stress is diagnosed through an assessment by your health care provider. Your health care provider will ask questions about your symptoms and any stressful life events.Your health care provider will also ask about your medical history and may order blood tests or other tests. Certain medical conditions and medicine can cause physical symptoms similar to stress. Mental illness can cause emotional symptoms and unhealthy behaviors similar to stress. Your health care provider may refer you to a mental health professional for further evaluation. How is this treated? Stress management is the recommended treatment for stress.The goals of stress management are reducing stressful life events and coping with stress in healthy ways. Techniques for reducing stressful life events include the following:  Stress identification. Self-monitor for stress and identify what causes stress for you. These skills may help you to avoid some stressful events.  Time management. Set your priorities, keep a calendar of events, and learn to say "no." These tools can help you avoid making too many commitments.  Techniques for coping with stress include the following:  Rethinking the problem. Try to think realistically about stressful events rather than ignoring them or overreacting. Try to find the positives in a stressful situation rather than focusing on the negatives.  Exercise. Physical exercise can release both physical and emotional tension. The key is to find a form of exercise you enjoy and do it regularly.  Relaxation techniques. These relax the body and  mind. Examples include yoga, meditation, tai chi, biofeedback, deep breathing, progressive muscle relaxation, listening to music, being out in nature, journaling, and other hobbies. Again, the key is to find  one or more that you enjoy and can do regularly.  Healthy lifestyle. Eat a balanced diet, get plenty of sleep, and do not smoke. Avoid using alcohol or drugs to relax.  Strong support network. Spend time with family, friends, or other people you enjoy being around.Express your feelings and talk things over with someone you trust.  Counseling or talktherapy with a mental health professional may be helpful if you are having difficulty managing stress on your own. Medicine is typically not recommended for the treatment of stress.Talk to your health care provider if you think you need medicine for symptoms of stress. Follow these instructions at home:  Keep all follow-up visits as directed by your health care provider.  Take all medicines as directed by your health care provider. Contact a health care provider if:  Your symptoms get worse or you start having new symptoms.  You feel overwhelmed by your problems and can no longer manage them on your own. Get help right away if:  You feel like hurting yourself or someone else. This information is not intended to replace advice given to you by your health care provider. Make sure you discuss any questions you have with your health care provider. Document Released: 03/15/2001 Document Revised: 02/25/2016 Document Reviewed: 05/14/2013 Elsevier Interactive Patient Education  2017 [employer] Inc[employer]  

## 2017-04-20 NOTE — Progress Notes (Signed)
Subjective:    Patient ID: [name], female    DOB: 02-06-1946, 71 y.o.   MRN: [id number]  HPI  [name] is here today for follow up of chronic medical problem.  Outpatient Encounter Prescriptions as of 04/20/2017  Medication Sig  . ALPRAZolam (XANAX) 1 MG tablet Take 1 Tablet by mouth 3 times a day as needed for anxiety  . Aspirin-Acetaminophen-Caffeine (GOODYS EXTRA STRENGTH) 500-325-65 MG PACK Take 1 packet by mouth every 8 (eight) hours as needed (for pain.).  Marland Kitchen aspirin-sod bicarb-citric acid (ALKA-SELTZER) 325 MG TBEF tablet Take 325 mg by mouth every 6 (six) hours as needed (cold/congestion).  . calcium carbonate (OS-CAL) 600 MG TABS Take 600 mg by mouth daily.   . Cholecalciferol (VITAMIN D3) 1000 UNITS CAPS Take 3,000 mg by mouth daily.   Marland Kitchen dicyclomine (BENTYL) 10 MG capsule TAKE 1 CAPSULE (10 MG TOTAL) BY MOUTH 4 (FOUR) TIMES DAILY AS NEEDED FOR SPASMS.  Marland Kitchen diphenhydrAMINE (BENADRYL) 25 MG tablet Take 25 mg by mouth 2 (two) times daily as needed. For Allergies  . fluticasone (FLONASE) 50 MCG/ACT nasal spray Place 2 sprays into both nostrils daily.  Marland Kitchen HYDROcodone-acetaminophen (NORCO) 10-325 MG tablet Take 1 tablet by mouth 3 (three) times daily.  Marland Kitchen HYDROcodone-acetaminophen (NORCO) 10-325 MG tablet Take 1 tablet by mouth 3 (three) times daily.  Marland Kitchen HYDROcodone-acetaminophen (NORCO) 10-325 MG tablet Take 1 tablet by mouth 3 (three) times daily.  Marland Kitchen lisinopril-hydrochlorothiazide (PRINZIDE,ZESTORETIC) 10-12.5 MG tablet Take 1 Tablet by mouth once daily  . Multiple Vitamin (MULITIVITAMIN WITH MINERALS) TABS Take 1 tablet by mouth daily.  . pantoprazole (PROTONIX) 40 MG tablet Take 1 Tablet by mouth 2 times a day  . PARoxetine (PAXIL) 20 MG tablet Take 1 Tablet by mouth once daily  . PROAIR HFA 108 (90 Base) MCG/ACT inhaler Inhale 2 puffs by mouth every 6 hours as needed for wheezing or shortness of breath  . simvastatin (ZOCOR) 40 MG tablet Take 1 Tablet by mouth at  bedtime  . vitamin C (ASCORBIC ACID) 500 MG tablet Take 1,000 mg by mouth daily.    No facility-administered encounter medications on file as of 04/20/2017.     1. Benign hypertension  No c/o chest pain,SOB or Headache- does not check bloop pressure at home  2. COPD (chronic obstructive pulmonary disease) with chronic bronchitis (HCC)  Denies SOB- does not use any inhalers  3. Gastroesophageal reflux disease without esophagitis  Controlled with protonix  4. Smoker  Has o desire to stop smoking  5. Hypokalemia  No c/o lower extremity cramping  6. Hyperlipidemia with target LDL less than 100  Doe sot really watch diet  7. Recurrent major depressive disorder, in full remission ([place])  Has been on paxil for many years- works well for her  8. Chronic midline low back pain without sciatica  Has had back problems for many years is currently on norco that sh etakes 3x a day. Unable to perform ADL's without meds due to pain  9. BMI 32.0-32.9,adult  No recent weight changes  10. Anxiety  Takes xanax '1mg'$  3x a day- have discussed weaning off because of pain meds buyt pateiunt says she stays to anxious- she has tried weaning off in the past.    New complaints: None todat  Social history: Husband died of ling cancer this past year. She is still having hard time with this. Lives by herself but children visit often. Her sister in law is staying with her right  now because she was being neglected by her granddaughter and she was stealing her money. Social cervices has gotten involved today   Review of Systems  Constitutional: Negative for activity change and appetite change.  HENT: Negative.   Eyes: Negative for pain.  Respiratory: Negative for shortness of breath.   Cardiovascular: Negative for chest pain, palpitations and leg swelling.  Gastrointestinal: Negative for abdominal pain.  Endocrine: Negative for polydipsia.  Genitourinary: Negative.   Skin: Negative for rash.  Neurological:  Negative for dizziness, weakness and headaches.  Hematological: Does not bruise/bleed easily.  Psychiatric/Behavioral: Negative.   All other systems reviewed and are negative.      Objective:   Physical Exam  Constitutional: She is oriented to person, place, and time. She appears well-developed and well-nourished.  HENT:  Nose: Nose normal.  Mouth/Throat: Oropharynx is clear and moist.  Eyes: EOM are normal.  Neck: Trachea normal, normal range of motion and full passive range of motion without pain. Neck supple. No JVD present. Carotid bruit is not present. No thyromegaly present.  Cardiovascular: Normal rate, regular rhythm, normal heart sounds and intact distal pulses.  Exam reveals no gallop and no friction rub.   No murmur heard. Pulmonary/Chest: Effort normal and breath sounds normal.  Abdominal: Soft. Bowel sounds are normal. She exhibits no distension and no mass. There is no tenderness.  Musculoskeletal: Normal range of motion.  Lymphadenopathy:    She has no cervical adenopathy.  Neurological: She is alert and oriented to person, place, and time. She has normal reflexes.  Skin: Skin is warm and dry.  Psychiatric: She has a normal mood and affect. Her behavior is normal. Judgment and thought content normal.    BP 122/64   Pulse 71   Temp 98 F (36.7 C) (Oral)   Ht '5\' 8"'$  (1.727 m)   Wt 175 lb (79.4 kg)   BMI 26.61 kg/m        Assessment & Plan:  1. Benign hypertension Low sodium diet - CMP14+EGFR  2. COPD (chronic obstructive pulmonary disease) with chronic bronchitis (HCC) Stop smoking  3. Gastroesophageal reflux disease without esophagitis Avoid spicy foods Do not eat 2 hours prior to bedtime  4. Smoker Smoking cesation encouraged  5. Hypokalemia  6. Hyperlipidemia with target LDL less than 100 Low fat diet - Lipid panel  7. Recurrent major depressive disorder, in full remission ([place]) Stress management  8. Chronic midline low back pain without  sciatica Moist heat to back No heavy lifting - HYDROcodone-acetaminophen (NORCO) 10-325 MG tablet; Take 1 tablet by mouth 3 (three) times daily.  Dispense: 90 tablet; Refill: 0 - HYDROcodone-acetaminophen (NORCO) 10-325 MG tablet; Take 1 tablet by mouth 3 (three) times daily.  Dispense: 90 tablet; Refill: 0 - HYDROcodone-acetaminophen (NORCO) 10-325 MG tablet; Take 1 tablet by mouth 3 (three) times daily.  Dispense: 90 tablet; Refill: 0  9. BMI 32.0-32.9,adult Discussed diet and exercise for person with BMI >25 Will recheck weight in 3-6 months  10. Anxiety Discussed weaning down to lower dose of xanax- patient says just cannot do right now due to what is going on with sister in law- will readdress and next appointment - ALPRAZolam (XANAX) 1 MG tablet; Take 1 Tablet by mouth 3 times a day as needed for anxiety  Dispense: 90 tablet; Refill: 1    Labs pending Health maintenance reviewed Diet and exercise encouraged Continue all meds Follow up  In 3 months   [place], FNP

## 2017-05-12 ENCOUNTER — Telehealth: Payer: Self-pay | Admitting: Nurse Practitioner

## 2017-05-15 NOTE — Telephone Encounter (Signed)
Is this for me?

## 2017-05-15 NOTE — Telephone Encounter (Signed)
Pt / notes are uncertain of who called her --- encounter to be closed

## 2017-06-01 ENCOUNTER — Other Ambulatory Visit: Payer: Self-pay | Admitting: Nurse Practitioner

## 2017-06-01 DIAGNOSIS — F419 Anxiety disorder, unspecified: Secondary | ICD-10-CM

## 2017-06-02 NOTE — Telephone Encounter (Signed)
Last seen 04/20/17  MMM If approved route to nurse to call into [facility]

## 2017-06-02 NOTE — Telephone Encounter (Signed)
Routing to you as you are covering for MMM

## 2017-06-02 NOTE — Telephone Encounter (Signed)
Phoned in.

## 2017-07-12 ENCOUNTER — Ambulatory Visit (INDEPENDENT_AMBULATORY_CARE_PROVIDER_SITE_OTHER): Payer: Medicare Other | Admitting: Nurse Practitioner

## 2017-07-12 ENCOUNTER — Encounter: Payer: Self-pay | Admitting: Nurse Practitioner

## 2017-07-12 VITALS — BP 134/70 | HR 64 | Temp 97.8°F | Ht 68.0 in | Wt 177.0 lb

## 2017-07-12 DIAGNOSIS — E876 Hypokalemia: Secondary | ICD-10-CM | POA: Diagnosis not present

## 2017-07-12 DIAGNOSIS — M545 Low back pain: Secondary | ICD-10-CM

## 2017-07-12 DIAGNOSIS — K219 Gastro-esophageal reflux disease without esophagitis: Secondary | ICD-10-CM

## 2017-07-12 DIAGNOSIS — Z6832 Body mass index (BMI) 32.0-32.9, adult: Secondary | ICD-10-CM

## 2017-07-12 DIAGNOSIS — Z23 Encounter for immunization: Secondary | ICD-10-CM | POA: Diagnosis not present

## 2017-07-12 DIAGNOSIS — Z122 Encounter for screening for malignant neoplasm of respiratory organs: Secondary | ICD-10-CM

## 2017-07-12 DIAGNOSIS — F3342 Major depressive disorder, recurrent, in full remission: Secondary | ICD-10-CM

## 2017-07-12 DIAGNOSIS — F419 Anxiety disorder, unspecified: Secondary | ICD-10-CM

## 2017-07-12 DIAGNOSIS — G8929 Other chronic pain: Secondary | ICD-10-CM

## 2017-07-12 DIAGNOSIS — F17200 Nicotine dependence, unspecified, uncomplicated: Secondary | ICD-10-CM | POA: Diagnosis not present

## 2017-07-12 DIAGNOSIS — K529 Noninfective gastroenteritis and colitis, unspecified: Secondary | ICD-10-CM

## 2017-07-12 DIAGNOSIS — E785 Hyperlipidemia, unspecified: Secondary | ICD-10-CM | POA: Diagnosis not present

## 2017-07-12 DIAGNOSIS — I10 Essential (primary) hypertension: Secondary | ICD-10-CM

## 2017-07-12 DIAGNOSIS — J449 Chronic obstructive pulmonary disease, unspecified: Secondary | ICD-10-CM | POA: Diagnosis not present

## 2017-07-12 DIAGNOSIS — Z9289 Personal history of other medical treatment: Secondary | ICD-10-CM

## 2017-07-12 DIAGNOSIS — J4489 Other specified chronic obstructive pulmonary disease: Secondary | ICD-10-CM

## 2017-07-12 MED ORDER — HYDROCODONE-ACETAMINOPHEN 10-325 MG PO TABS: 1.0000 | ORAL_TABLET | Freq: Three times a day (TID) | ORAL | 0 refills | Status: DC

## 2017-07-12 MED ORDER — PAROXETINE HCL 20 MG PO TABS: 20.0000 mg | ORAL_TABLET | Freq: Every day | ORAL | 1 refills | Status: DC

## 2017-07-12 MED ORDER — ALPRAZOLAM 1 MG PO TABS: ORAL_TABLET | ORAL | 1 refills | Status: DC

## 2017-07-12 MED ORDER — DICYCLOMINE HCL 10 MG PO CAPS: 10.0000 mg | ORAL_CAPSULE | Freq: Four times a day (QID) | ORAL | 5 refills | Status: DC | PRN

## 2017-07-12 MED ORDER — LISINOPRIL-HYDROCHLOROTHIAZIDE 10-12.5 MG PO TABS: 1.0000 | ORAL_TABLET | Freq: Every day | ORAL | 1 refills | Status: DC

## 2017-07-12 MED ORDER — SIMVASTATIN 40 MG PO TABS: 40.0000 mg | ORAL_TABLET | Freq: Every day | ORAL | 1 refills | Status: DC

## 2017-07-12 NOTE — Progress Notes (Signed)
Subjective:    Patient ID: [name], female    DOB: 1946/03/30, 71 y.o.   MRN: [id number]  HPI  MAIYA KATES is here today for follow up of chronic medical problem.  Outpatient Encounter Prescriptions as of 07/12/2017  Medication Sig  . ALPRAZolam (XANAX) 1 MG tablet Take 1 Tablet by mouth 3 times a day as needed FOR ANXIETY  . Aspirin-Acetaminophen-Caffeine (GOODYS EXTRA STRENGTH) 500-325-65 MG PACK Take 1 packet by mouth every 8 (eight) hours as needed (for pain.).  Marland Kitchen aspirin-sod bicarb-citric acid (ALKA-SELTZER) 325 MG TBEF tablet Take 325 mg by mouth every 6 (six) hours as needed (cold/congestion).  . calcium carbonate (OS-CAL) 600 MG TABS Take 600 mg by mouth daily.   . Cholecalciferol (VITAMIN D3) 1000 UNITS CAPS Take 3,000 mg by mouth daily.   Marland Kitchen dicyclomine (BENTYL) 10 MG capsule TAKE 1 CAPSULE (10 MG TOTAL) BY MOUTH 4 (FOUR) TIMES DAILY AS NEEDED FOR SPASMS.  Marland Kitchen diphenhydrAMINE (BENADRYL) 25 MG tablet Take 25 mg by mouth 2 (two) times daily as needed. For Allergies  . fluticasone (FLONASE) 50 MCG/ACT nasal spray Place 2 sprays into both nostrils daily.  Marland Kitchen HYDROcodone-acetaminophen (NORCO) 10-325 MG tablet Take 1 tablet by mouth 3 (three) times daily.  Marland Kitchen HYDROcodone-acetaminophen (NORCO) 10-325 MG tablet Take 1 tablet by mouth 3 (three) times daily.  Marland Kitchen HYDROcodone-acetaminophen (NORCO) 10-325 MG tablet Take 1 tablet by mouth 3 (three) times daily.  Marland Kitchen lisinopril-hydrochlorothiazide (PRINZIDE,ZESTORETIC) 10-12.5 MG tablet Take 1 Tablet by mouth once daily  . Multiple Vitamin (MULITIVITAMIN WITH MINERALS) TABS Take 1 tablet by mouth daily.  . pantoprazole (PROTONIX) 40 MG tablet Take 1 Tablet by mouth 2 times a day  . PARoxetine (PAXIL) 20 MG tablet Take 1 Tablet by mouth once daily  . PROAIR HFA 108 (90 Base) MCG/ACT inhaler Inhale 2 puffs by mouth every 6 hours as needed for wheezing or shortness of breath  . simvastatin (ZOCOR) 40 MG tablet Take 1 Tablet by mouth at  bedtime  . vitamin C (ASCORBIC ACID) 500 MG tablet Take 1,000 mg by mouth daily.    No facility-administered encounter medications on file as of 07/12/2017.     1. Benign hypertension  No c/o chest pain, SOB or headache. Does not check blood pressure at home. BP Readings from Last 3 Encounters:  07/12/17 134/70  04/20/17 122/64  02/09/17 100/67     2. COPD (chronic obstructive pulmonary disease) with chronic bronchitis ([place])  patient currently on no inhalers. Has not needed albuterol in several months  3. Gastroesophageal reflux disease without esophagitis  Takes protonix daily- works well to keep symptoms under control  4. Smoker  Has no desire to stop smoking  5. Hypokalemia  No c/o lower ext cramping  6. Hyperlipidemia with target LDL less than 100  Does not watch diet  7. Recurrent major depressive disorder, in full remission ([place])  Has been on paxil for quite sometime  8. Chronic midline low back pain without sciatica  Pain assessment: Cause of pain- DDD Pain location- lower back Pain on scale of 1-10- 5-8/10 Frequency- daily What increases pain- to much activity What makes pain Better- rest Effects on ADL -   Any change in general medical condition-takes time to get things done but is able  Current medications- norco 10/325 TID Effectiveness of current meds-does not completely relie pain Adverse reactions form pain meds-none Morphine equivalent>20  Pill count performed-No U[place] drug screen- No Was the [place] reviewed- yes  If yes were their any concerning findings? - no problems noted  Pain contract signed on: 07/12/2017   9. BMI 32.0-32.9,adult  No recent weight changes  10. Anxiety  Takes xanax 67m tid- has discussed weaning down to lower dose .    New complaints: None today  Social history: Lives alone- husband died several years ago.    Review of Systems  Constitutional: Negative for activity change and appetite change.  HENT: Negative.     Eyes: Negative for pain.  Respiratory: Negative for shortness of breath.   Cardiovascular: Negative for chest pain, palpitations and leg swelling.  Gastrointestinal: Negative for abdominal pain.  Endocrine: Negative for polydipsia.  Genitourinary: Negative.   Musculoskeletal: Positive for back pain.  Skin: Negative for rash.  Neurological: Negative for dizziness, weakness and headaches.  Hematological: Does not bruise/bleed easily.  Psychiatric/Behavioral: Negative.   All other systems reviewed and are negative.      Objective:   Physical Exam  Constitutional: She is oriented to person, place, and time. She appears well-developed and well-nourished.  HENT:  Nose: Nose normal.  Mouth/Throat: Oropharynx is clear and moist.  Eyes: EOM are normal.  Neck: Trachea normal, normal range of motion and full passive range of motion without pain. Neck supple. No JVD present. Carotid bruit is not present. No thyromegaly present.  Cardiovascular: Normal rate, regular rhythm, normal heart sounds and intact distal pulses.  Exam reveals no gallop and no friction rub.   No murmur heard. Pulmonary/Chest: Effort normal and breath sounds normal.  Abdominal: Soft. Bowel sounds are normal. She exhibits no distension and no mass. There is no tenderness.  Musculoskeletal: Normal range of motion.  Low back pain on palpation (-) SLR bil Motor strength and sensation distally intact  Lymphadenopathy:    She has no cervical adenopathy.  Neurological: She is alert and oriented to person, place, and time. She has normal reflexes.  Skin: Skin is warm and dry.  Psychiatric: She has a normal mood and affect. Her behavior is normal. Judgment and thought content normal.   BP 134/70   Pulse 64   Temp 97.8 F (36.6 C) (Oral)   Ht _0  (1.727 m)   Wt 177 lb (80.3 kg)   BMI 26.91 kg/m         Assessment & Plan:  1. Benign hypertension Low sodium doiet - lisinopril-hydrochlorothiazide  (PRINZIDE,ZESTORETIC) 10-12.5 MG tablet; Take 1 tablet by mouth daily.  Dispense: 90 tablet; Refill: 1 - CMP14+EGFR  2. COPD (chronic obstructive pulmonary disease) with chronic bronchitis (HCC) Smoking cessation encouraged  3. Gastroesophageal reflux disease without esophagitis Avoid spicy foods Do not eat 2 hours prior to bedtime  4. Smoker Smoking cessation encourgaed  5. Hypokalemia  6. Hyperlipidemia with target LDL less than 100 Low fat diet - simvastatin (ZOCOR) 40 MG tablet; Take 1 tablet (40 mg total) by mouth at bedtime.  Dispense: 90 tablet; Refill: 1 - Lipid panel  7. Recurrent major [place] full [place] Stress management - PARoxetine (PAXIL) 20 MG tablet; Take 1 tablet (20 mg total) by mouth daily.  Dispense: 90 tablet; Refill: 1  8. Chronic midline low back pain without sciatica Moist heat rest - HYDROcodone-acetaminophen (NORCO) 10-325 MG tablet; Take 1 tablet by mouth 3 (three) times daily.  Dispense: 90 tablet; Refill: 0 - HYDROcodone-acetaminophen (NORCO) 10-325 MG tablet; Take 1 tablet by mouth 3 (three) times daily.  Dispense: 90 tablet; Refill: 0 - HYDROcodone-acetaminophen (NORCO) 10-325 MG tablet; Take 1 tablet  by mouth 3 (three) times daily.  Dispense: 90 tablet; Refill: 0  9. BMI 32.0-32.9,adult Discussed diet and exercise for person with BMI >25 Will recheck weight in 3-6 months  10. Anxiety Discussed weaning off of xanax- will only give BID when needs refill - ALPRAZolam (XANAX) 1 MG tablet; Take 1 Tablet by mouth 3 times a day as needed FOR ANXIETY  Dispense: 90 tablet; Refill: 1  11. Chronic diarrhea - dicyclomine (BENTYL) 10 MG capsule; Take 1 capsule (10 mg total) by mouth 4 (four) times daily as needed for spasms.  Dispense: 120 capsule; Refill: 5  12. Encounter for screening for malignant neoplasm of respiratory organs - CT CHEST LUNG CA SCREEN LOW DOSE W/O CM; Future  13. Hx of mammogram - MM SCREENING BREAST TOMO  BILATERAL; Future    Labs pending Health maintenance reviewed Diet and exercise encouraged Continue all meds Follow up  In 3 months   [place], FNP

## 2017-07-12 NOTE — Patient Instructions (Signed)
Stress and Stress Management Stress is a normal reaction to life events. It is what you feel when life demands more than you are used to or more than you can handle. Some stress can be useful. For example, the stress reaction can help you catch the last bus of the day, study for a test, or meet a deadline at work. But stress that occurs too often or for too long can cause problems. It can affect your emotional health and interfere with relationships and normal daily activities. Too much stress can weaken your immune system and increase your risk for physical illness. If you already have a medical problem, stress can make it worse. What are the causes? All sorts of life events may cause stress. An event that causes stress for one person may not be stressful for another person. Major life events commonly cause stress. These may be positive or negative. Examples include losing your job, moving into a new home, getting married, having a baby, or losing a loved one. Less obvious life events may also cause stress, especially if they occur day after day or in combination. Examples include working long hours, driving in traffic, caring for children, being in debt, or being in a difficult relationship. What are the signs or symptoms? Stress may cause emotional symptoms including, the following:  Anxiety. This is feeling worried, afraid, on edge, overwhelmed, or out of control.  Anger. This is feeling irritated or impatient.  Depression. This is feeling sad, down, helpless, or guilty.  Difficulty focusing, remembering, or making decisions.  Stress may cause physical symptoms, including the following:  Aches and pains. These may affect your head, neck, back, stomach, or other areas of your body.  Tight muscles or clenched jaw.  Low energy or trouble sleeping.  Stress may cause unhealthy behaviors, including the following:  Eating to feel better (overeating) or skipping meals.  Sleeping too little,  too much, or both.  Working too much or putting off tasks (procrastination).  Smoking, drinking alcohol, or using drugs to feel better.  How is this diagnosed? Stress is diagnosed through an assessment by your health care provider. Your health care provider will ask questions about your symptoms and any stressful life events.Your health care provider will also ask about your medical history and may order blood tests or other tests. Certain medical conditions and medicine can cause physical symptoms similar to stress. Mental illness can cause emotional symptoms and unhealthy behaviors similar to stress. Your health care provider may refer you to a mental health professional for further evaluation. How is this treated? Stress management is the recommended treatment for stress.The goals of stress management are reducing stressful life events and coping with stress in healthy ways. Techniques for reducing stressful life events include the following:  Stress identification. Self-monitor for stress and identify what causes stress for you. These skills may help you to avoid some stressful events.  Time management. Set your priorities, keep a calendar of events, and learn to say "no." These tools can help you avoid making too many commitments.  Techniques for coping with stress include the following:  Rethinking the problem. Try to think realistically about stressful events rather than ignoring them or overreacting. Try to find the positives in a stressful situation rather than focusing on the negatives.  Exercise. Physical exercise can release both physical and emotional tension. The key is to find a form of exercise you enjoy and do it regularly.  Relaxation techniques. These relax the body and  mind. Examples include yoga, meditation, tai chi, biofeedback, deep breathing, progressive muscle relaxation, listening to music, being out in nature, journaling, and other hobbies. Again, the key is to find  one or more that you enjoy and can do regularly.  Healthy lifestyle. Eat a balanced diet, get plenty of sleep, and do not smoke. Avoid using alcohol or drugs to relax.  Strong support network. Spend time with family, friends, or other people you enjoy being around.Express your feelings and talk things over with someone you trust.  Counseling or talktherapy with a mental health professional may be helpful if you are having difficulty managing stress on your own. Medicine is typically not recommended for the treatment of stress.Talk to your health care provider if you think you need medicine for symptoms of stress. Follow these instructions at home:  Keep all follow-up visits as directed by your health care provider.  Take all medicines as directed by your health care provider. Contact a health care provider if:  Your symptoms get worse or you start having new symptoms.  You feel overwhelmed by your problems and can no longer manage them on your own. Get help right away if:  You feel like hurting yourself or someone else. This information is not intended to replace advice given to you by your health care provider. Make sure you discuss any questions you have with your health care provider. Document Released: 03/15/2001 Document Revised: 02/25/2016 Document Reviewed: 05/14/2013 Elsevier Interactive Patient Education  2017 [employer].

## 2017-07-13 LAB — CMP14+EGFR
A/G RATIO: 2 (ref 1.2–2.2)
ALT: 14 IU/L (ref 0–32)
AST: 20 IU/L (ref 0–40)
Albumin: 4.1 g/dL (ref 3.6–4.8)
Alkaline Phosphatase: 98 IU/L (ref 39–117)
BUN/Creatinine Ratio: 13 (ref 12–28)
BUN: 7 mg/dL — AB (ref 8–27)
Bilirubin Total: 0.4 mg/dL (ref 0.0–1.2)
CO2: 30 mmol/L — ABNORMAL HIGH (ref 20–29)
Calcium: 9.3 mg/dL (ref 8.7–10.3)
Chloride: 97 mmol/L (ref 96–106)
Creatinine, Ser: 0.53 mg/dL — ABNORMAL LOW (ref 0.57–1.00)
GFR, EST AFRICAN AMERICAN: 112 mL/min/{1.73_m2} (ref >59)
GFR, EST NON AFRICAN AMERICAN: 97 mL/min/{1.73_m2} (ref >59)
GLUCOSE: 83 mg/dL (ref 65–99)
Globulin, Total: 2.1 g/dL (ref 1.5–4.5)
Potassium: 4.2 mmol/L (ref 3.5–5.2)
Sodium: 142 mmol/L (ref 134–144)
TOTAL PROTEIN: 6.2 g/dL (ref 6.0–8.5)

## 2017-07-13 LAB — LIPID PANEL
CHOL/HDL RATIO: 2 ratio (ref 0.0–4.4)
Cholesterol, Total: 139 mg/dL (ref 100–199)
HDL: 71 mg/dL (ref >39)
LDL CALC: 49 mg/dL (ref 0–99)
Triglycerides: 94 mg/dL (ref 0–149)
VLDL CHOLESTEROL CAL: 19 mg/dL (ref 5–40)

## 2017-07-18 ENCOUNTER — Telehealth: Payer: Self-pay

## 2017-07-18 NOTE — Telephone Encounter (Signed)
[facility] to x-ray for mammogram appointment  Pt id scheduled for 08/01/16 at 1:50 at the [facility] in [place]. Arrive at 1:30 for registration

## 2017-07-24 ENCOUNTER — Ambulatory Visit: Payer: Medicare Other | Admitting: Nurse Practitioner

## 2017-08-02 ENCOUNTER — Ambulatory Visit (HOSPITAL_COMMUNITY)
Admission: RE | Admit: 2017-08-02 | Discharge: 2017-08-02 | Disposition: A | Discharge status: Home / Self Care | Payer: Medicare Other | Source: Ambulatory Visit | Attending: Nurse Practitioner | Admitting: Nurse Practitioner

## 2017-08-02 DIAGNOSIS — R918 Other nonspecific abnormal finding of lung field: Secondary | ICD-10-CM | POA: Insufficient documentation

## 2017-08-02 DIAGNOSIS — I700 Atherosclerosis of aorta: Secondary | ICD-10-CM | POA: Insufficient documentation

## 2017-08-02 DIAGNOSIS — Z122 Encounter for screening for malignant neoplasm of respiratory organs: Secondary | ICD-10-CM | POA: Diagnosis not present

## 2017-08-02 DIAGNOSIS — F17210 Nicotine dependence, cigarettes, uncomplicated: Secondary | ICD-10-CM | POA: Diagnosis not present

## 2017-08-03 ENCOUNTER — Encounter: Payer: Self-pay | Admitting: Nurse Practitioner

## 2017-08-03 DIAGNOSIS — I700 Atherosclerosis of aorta: Secondary | ICD-10-CM | POA: Insufficient documentation

## 2017-10-09 DIAGNOSIS — H5203 Hypermetropia, bilateral: Secondary | ICD-10-CM | POA: Diagnosis not present

## 2017-10-10 ENCOUNTER — Other Ambulatory Visit: Payer: Self-pay | Admitting: *Deleted

## 2017-10-10 DIAGNOSIS — K219 Gastro-esophageal reflux disease without esophagitis: Secondary | ICD-10-CM

## 2017-10-10 MED ORDER — PANTOPRAZOLE SODIUM 40 MG PO TBEC: 40.0000 mg | DELAYED_RELEASE_TABLET | Freq: Two times a day (BID) | ORAL | 0 refills | Status: DC

## 2017-10-16 ENCOUNTER — Encounter: Payer: Self-pay | Admitting: Nurse Practitioner

## 2017-10-16 ENCOUNTER — Ambulatory Visit (INDEPENDENT_AMBULATORY_CARE_PROVIDER_SITE_OTHER): Payer: Medicare Other | Admitting: Nurse Practitioner

## 2017-10-16 VITALS — BP 123/74 | HR 80 | Temp 97.5°F | Ht 68.0 in | Wt 176.0 lb

## 2017-10-16 DIAGNOSIS — I10 Essential (primary) hypertension: Secondary | ICD-10-CM

## 2017-10-16 DIAGNOSIS — K529 Noninfective gastroenteritis and colitis, unspecified: Secondary | ICD-10-CM | POA: Diagnosis not present

## 2017-10-16 DIAGNOSIS — F419 Anxiety disorder, unspecified: Secondary | ICD-10-CM

## 2017-10-16 DIAGNOSIS — J449 Chronic obstructive pulmonary disease, unspecified: Secondary | ICD-10-CM | POA: Diagnosis not present

## 2017-10-16 DIAGNOSIS — E785 Hyperlipidemia, unspecified: Secondary | ICD-10-CM

## 2017-10-16 DIAGNOSIS — E876 Hypokalemia: Secondary | ICD-10-CM | POA: Diagnosis not present

## 2017-10-16 DIAGNOSIS — F17200 Nicotine dependence, unspecified, uncomplicated: Secondary | ICD-10-CM

## 2017-10-16 DIAGNOSIS — F3342 Major depressive disorder, recurrent, in full remission: Secondary | ICD-10-CM

## 2017-10-16 DIAGNOSIS — I700 Atherosclerosis of aorta: Secondary | ICD-10-CM

## 2017-10-16 DIAGNOSIS — K219 Gastro-esophageal reflux disease without esophagitis: Secondary | ICD-10-CM | POA: Diagnosis not present

## 2017-10-16 DIAGNOSIS — M545 Low back pain: Secondary | ICD-10-CM

## 2017-10-16 DIAGNOSIS — Z6832 Body mass index (BMI) 32.0-32.9, adult: Secondary | ICD-10-CM

## 2017-10-16 DIAGNOSIS — G8929 Other chronic pain: Secondary | ICD-10-CM

## 2017-10-16 MED ORDER — DICYCLOMINE HCL 10 MG PO CAPS: 10.0000 mg | ORAL_CAPSULE | Freq: Four times a day (QID) | ORAL | 5 refills | Status: DC | PRN

## 2017-10-16 MED ORDER — HYDROCODONE-ACETAMINOPHEN 10-325 MG PO TABS: 1.0000 | ORAL_TABLET | Freq: Three times a day (TID) | ORAL | 0 refills | Status: DC

## 2017-10-16 MED ORDER — PAROXETINE HCL 20 MG PO TABS: 20.0000 mg | ORAL_TABLET | Freq: Every day | ORAL | 1 refills | Status: DC

## 2017-10-16 MED ORDER — PANTOPRAZOLE SODIUM 40 MG PO TBEC: 40.0000 mg | DELAYED_RELEASE_TABLET | Freq: Two times a day (BID) | ORAL | 1 refills | Status: DC

## 2017-10-16 MED ORDER — LISINOPRIL-HYDROCHLOROTHIAZIDE 10-12.5 MG PO TABS: 1.0000 | ORAL_TABLET | Freq: Every day | ORAL | 1 refills | Status: DC

## 2017-10-16 MED ORDER — ALPRAZOLAM 0.5 MG PO TABS: 0.5000 mg | ORAL_TABLET | Freq: Three times a day (TID) | ORAL | 2 refills | Status: DC | PRN

## 2017-10-16 MED ORDER — SIMVASTATIN 40 MG PO TABS: 40.0000 mg | ORAL_TABLET | Freq: Every day | ORAL | 1 refills | Status: DC

## 2017-10-16 NOTE — Progress Notes (Signed)
Subjective:    Patient ID: [name], female    DOB: 06-21-1946, 72 y.o.   MRN: [id number]  HPI   [name] is here today for follow up of chronic medical problem.  Outpatient Encounter Medications as of 10/16/2017  Medication Sig  . ALPRAZolam (XANAX) 1 MG tablet Take 1 Tablet by mouth 3 times a day as needed FOR ANXIETY  . Aspirin-Acetaminophen-Caffeine (GOODYS EXTRA STRENGTH) 500-325-65 MG PACK Take 1 packet by mouth every 8 (eight) hours as needed (for pain.).  Marland Kitchen aspirin-sod bicarb-citric acid (ALKA-SELTZER) 325 MG TBEF tablet Take 325 mg by mouth every 6 (six) hours as needed (cold/congestion).  . calcium carbonate (OS-CAL) 600 MG TABS Take 600 mg by mouth daily.   . Cholecalciferol (VITAMIN D3) 1000 UNITS CAPS Take 3,000 mg by mouth daily.   Marland Kitchen dicyclomine (BENTYL) 10 MG capsule Take 1 capsule (10 mg total) by mouth 4 (four) times daily as needed for spasms.  . diphenhydrAMINE (BENADRYL) 25 MG tablet Take 25 mg by mouth 2 (two) times daily as needed. For Allergies  . fluticasone (FLONASE) 50 MCG/ACT nasal spray Place 2 sprays into both nostrils daily.  Marland Kitchen HYDROcodone-acetaminophen (NORCO) 10-325 MG tablet Take 1 tablet by mouth 3 (three) times daily.  Marland Kitchen HYDROcodone-acetaminophen (NORCO) 10-325 MG tablet Take 1 tablet by mouth 3 (three) times daily.  Marland Kitchen HYDROcodone-acetaminophen (NORCO) 10-325 MG tablet Take 1 tablet by mouth 3 (three) times daily.  Marland Kitchen lisinopril-hydrochlorothiazide (PRINZIDE,ZESTORETIC) 10-12.5 MG tablet Take 1 tablet by mouth daily.  . Multiple Vitamin (MULITIVITAMIN WITH MINERALS) TABS Take 1 tablet by mouth daily.  . pantoprazole (PROTONIX) 40 MG tablet Take 1 tablet (40 mg total) by mouth 2 (two) times daily.  Marland Kitchen PARoxetine (PAXIL) 20 MG tablet Take 1 tablet (20 mg total) by mouth daily.  Marland Kitchen PROAIR HFA 108 (90 Base) MCG/ACT inhaler Inhale 2 puffs by mouth every 6 hours as needed for wheezing or shortness of breath  . simvastatin (ZOCOR) 40 MG tablet  Take 1 tablet (40 mg total) by mouth at bedtime.  . vitamin C (ASCORBIC ACID) 500 MG tablet Take 1,000 mg by mouth daily.      1. Benign hypertension  No c/o chest pain, sob or headache. Does ot check blood pressure at home. BP Readings from Last 3 Encounters:  07/12/17 134/70  04/20/17 122/64  02/09/17 100/67     2. Aortic atherosclerosis (HCC)  Seen on cheat xray- just watching for now.  3. COPD (chronic obstructive pulmonary disease) with chronic bronchitis ([place]) Patient is still a smoker. deneis any sob or chronic cough. Cough is intermittent   4. Gastroesophageal reflux disease without esophagitis  Takes protonix daily- keeps symptoms under control  5. Anxiety  Stays stressed. Has been on xanax 1mg  yid for many years- are trying to wean her down due to fact she takes pain meicine  6. Hypokalemia  No c/o lower ext cramping  7. Chronic midline low back pain without sciatica  Has back pain everyday . Takes hyrocodone 3x a day. Helps but does not completely resolve pain  8. Recurrent major depressive disorder, in full remission ([place])  Has been depressed since husband got sick 8 years ago. He passed away 2 years ago. She takes paxil dialy which helps Depression screen [facility] 2/9 10/16/2017 07/12/2017 04/20/2017  Decreased Interest 0 0 0  Down, Depressed, Hopeless 0 0 0  PHQ - 2 Score 0 0 0  Altered sleeping - - -  Tired, decreased energy - - -  Change in appetite - - -  Feeling bad or failure about yourself  - - -  Trouble concentrating - - -  Moving slowly or fidgety/restless - - -  Suicidal thoughts - - -  PHQ-9 Score - - -  Difficult doing work/chores - - -     9. Hyperlipidemia with target LDL less than 100  Does not watch diet  10. BMI 32.0-32.9,adult  No recent weight changes  11. Smoker  Unable to quit smoking despite her husband dying of smoking induced ling cancer.    New complaints: None today  Social history: Lives alone since husband passed.   Review of  Systems  Constitutional: Negative for activity change and appetite change.  HENT: Negative.   Eyes: Negative for pain.  Respiratory: Negative for shortness of breath.   Cardiovascular: Negative for chest pain, palpitations and leg swelling.  Gastrointestinal: Negative for abdominal pain.  Endocrine: Negative for polydipsia.  Genitourinary: Negative.   Skin: Negative for rash.  Neurological: Negative for dizziness, weakness and headaches.  Hematological: Does not bruise/bleed easily.  Psychiatric/Behavioral: Negative.   All other systems reviewed and are negative.      Objective:   Physical Exam  Constitutional: She is oriented to person, place, and time. She appears well-developed and well-nourished.  HENT:  Nose: Nose normal.  Mouth/Throat: Oropharynx is clear and moist.  Eyes: EOM are normal.  Neck: Trachea normal, normal range of motion and full passive range of motion without pain. Neck supple. No JVD present. Carotid bruit is not present. No thyromegaly present.  Cardiovascular: Normal rate, regular rhythm, normal heart sounds and intact distal pulses. Exam reveals no gallop and no friction rub.  No murmur heard. Pulmonary/Chest: Effort normal and breath sounds normal.  Abdominal: Soft. Bowel sounds are normal. She exhibits no distension and no mass. There is no tenderness.  Musculoskeletal: Normal range of motion.  Lymphadenopathy:    She has no cervical adenopathy.  Neurological: She is alert and oriented to person, place, and time. She has normal reflexes.  Skin: Skin is warm and dry.  Psychiatric: She has a normal mood and affect. Her behavior is normal. Judgment and thought content normal.   BP 123/74   Pulse 80   Temp (!) 97.5 F (36.4 C) (Oral)   Ht 5\' 8"  (1.727 m)   Wt 176 lb (79.8 kg)   BMI 26.76 kg/m       Assessment & Plan:  1. Benign hypertension Low sodium diet - lisinopril-hydrochlorothiazide (PRINZIDE,ZESTORETIC) 10-12.5 MG tablet; Take 1 tablet  by mouth daily.  Dispense: 90 tablet; Refill: 1  2. Aortic atherosclerosis (HCC) watching  3. COPD (chronic obstructive pulmonary disease) with chronic bronchitis ([place])  4. Gastroesophageal reflux disease without esophagitis Avoid spicy foods Do not eat 2 hours prior to bedtime - pantoprazole (PROTONIX) 40 MG tablet; Take 1 tablet (40 mg total) by mouth 2 (two) times daily.  Dispense: 180 tablet; Refill: 1  5. Anxiety Stress management Decrease xanx from 1mg  tid to 0.5mg  tid - ALPRAZolam (XANAX) 0.5 MG tablet; Take 1 tablet (0.5 mg total) by mouth 3 (three) times daily as needed for anxiety.  Dispense: 90 tablet; Refill: 2  6. Hypokalemia  7. Chronic midline low back pain without sciatica Back stretching exercises - HYDROcodone-acetaminophen (NORCO) 10-325 MG tablet; Take 1 tablet by mouth 3 (three) times daily.  Dispense: 90 tablet; Refill: 0 - HYDROcodone-acetaminophen (NORCO) 10-325 MG tablet; Take 1 tablet by mouth 3 (three) times daily.  Dispense: 90  tablet; Refill: 0 - HYDROcodone-acetaminophen (NORCO) 10-325 MG tablet; Take 1 tablet by mouth 3 (three) times daily.  Dispense: 90 tablet; Refill: 0  8. Recurrent major depressive disorder, in full remission ([place]) stress management - PARoxetine (PAXIL) 20 MG tablet; Take 1 tablet (20 mg total) by mouth daily.  Dispense: 90 tablet; Refill: 1  9. Hyperlipidemia with target LDL less than 100 Low fat diet - simvastatin (ZOCOR) 40 MG tablet; Take 1 tablet (40 mg total) by mouth at bedtime.  Dispense: 90 tablet; Refill: 1  10. BMI 32.0-32.9,adult Discussed diet and exercise for person with BMI >25 Will recheck weight in 3-6 months  11. Smoker encouraged to quit smoking  12. Chronic diarrhea - dicyclomine (BENTYL) 10 MG capsule; Take 1 capsule (10 mg total) by mouth 4 (four) times daily as needed for spasms.  Dispense: 120 capsule; Refill: 5    Labs pending Health maintenance reviewed Diet and exercise encouraged Continue  all meds Follow up  In 3 months   [place], FNP

## 2017-10-16 NOTE — Patient Instructions (Signed)

## 2017-10-16 NOTE — Addendum Note (Signed)
Addended by: Chevis Pretty on: 10/16/2017 10:33 AM   Modules accepted: Orders

## 2017-10-17 LAB — CMP14+EGFR
ALBUMIN: 4.1 g/dL (ref 3.5–4.8)
ALT: 10 IU/L (ref 0–32)
AST: 17 IU/L (ref 0–40)
Albumin/Globulin Ratio: 1.8 (ref 1.2–2.2)
Alkaline Phosphatase: 83 IU/L (ref 39–117)
BILIRUBIN TOTAL: 0.4 mg/dL (ref 0.0–1.2)
BUN / CREAT RATIO: 8 — AB (ref 12–28)
BUN: 5 mg/dL — AB (ref 8–27)
CHLORIDE: 98 mmol/L (ref 96–106)
CO2: 31 mmol/L — ABNORMAL HIGH (ref 20–29)
Calcium: 9.6 mg/dL (ref 8.7–10.3)
Creatinine, Ser: 0.6 mg/dL (ref 0.57–1.00)
GFR, EST AFRICAN AMERICAN: 107 mL/min/{1.73_m2} (ref >59)
GFR, EST NON AFRICAN AMERICAN: 93 mL/min/{1.73_m2} (ref >59)
GLUCOSE: 88 mg/dL (ref 65–99)
Globulin, Total: 2.3 g/dL (ref 1.5–4.5)
Potassium: 4 mmol/L (ref 3.5–5.2)
Sodium: 142 mmol/L (ref 134–144)
TOTAL PROTEIN: 6.4 g/dL (ref 6.0–8.5)

## 2017-10-17 LAB — LIPID PANEL
CHOL/HDL RATIO: 2 ratio (ref 0.0–4.4)
Cholesterol, Total: 155 mg/dL (ref 100–199)
HDL: 77 mg/dL (ref >39)
LDL Calculated: 57 mg/dL (ref 0–99)
Triglycerides: 107 mg/dL (ref 0–149)
VLDL CHOLESTEROL CAL: 21 mg/dL (ref 5–40)

## 2017-11-19 ENCOUNTER — Emergency Department (HOSPITAL_COMMUNITY): Payer: Medicare Other

## 2017-11-19 ENCOUNTER — Other Ambulatory Visit: Payer: Self-pay

## 2017-11-19 ENCOUNTER — Encounter (HOSPITAL_COMMUNITY): Payer: Self-pay | Admitting: Emergency Medicine

## 2017-11-19 ENCOUNTER — Inpatient Hospital Stay (HOSPITAL_COMMUNITY)
Admission: EM | Admit: 2017-11-19 | Discharge: 2017-11-22 | DRG: 193 | Disposition: A | Discharge status: Home / Self Care | Payer: Medicare Other | Attending: Internal Medicine | Admitting: Internal Medicine

## 2017-11-19 DIAGNOSIS — Z825 Family history of asthma and other chronic lower respiratory diseases: Secondary | ICD-10-CM

## 2017-11-19 DIAGNOSIS — E7800 Pure hypercholesterolemia, unspecified: Secondary | ICD-10-CM | POA: Diagnosis present

## 2017-11-19 DIAGNOSIS — Z66 Do not resuscitate: Secondary | ICD-10-CM | POA: Diagnosis not present

## 2017-11-19 DIAGNOSIS — J9601 Acute respiratory failure with hypoxia: Secondary | ICD-10-CM | POA: Diagnosis present

## 2017-11-19 DIAGNOSIS — E876 Hypokalemia: Secondary | ICD-10-CM | POA: Diagnosis present

## 2017-11-19 DIAGNOSIS — M545 Low back pain: Secondary | ICD-10-CM | POA: Diagnosis present

## 2017-11-19 DIAGNOSIS — I471 Supraventricular tachycardia: Secondary | ICD-10-CM | POA: Diagnosis not present

## 2017-11-19 DIAGNOSIS — R079 Chest pain, unspecified: Secondary | ICD-10-CM | POA: Diagnosis not present

## 2017-11-19 DIAGNOSIS — M549 Dorsalgia, unspecified: Secondary | ICD-10-CM | POA: Diagnosis not present

## 2017-11-19 DIAGNOSIS — Z79899 Other long term (current) drug therapy: Secondary | ICD-10-CM

## 2017-11-19 DIAGNOSIS — I509 Heart failure, unspecified: Secondary | ICD-10-CM | POA: Diagnosis not present

## 2017-11-19 DIAGNOSIS — Z7951 Long term (current) use of inhaled steroids: Secondary | ICD-10-CM | POA: Diagnosis not present

## 2017-11-19 DIAGNOSIS — R0602 Shortness of breath: Secondary | ICD-10-CM | POA: Diagnosis not present

## 2017-11-19 DIAGNOSIS — E785 Hyperlipidemia, unspecified: Secondary | ICD-10-CM | POA: Diagnosis not present

## 2017-11-19 DIAGNOSIS — I700 Atherosclerosis of aorta: Secondary | ICD-10-CM | POA: Diagnosis present

## 2017-11-19 DIAGNOSIS — J101 Influenza due to other identified influenza virus with other respiratory manifestations: Secondary | ICD-10-CM | POA: Diagnosis not present

## 2017-11-19 DIAGNOSIS — I5030 Unspecified diastolic (congestive) heart failure: Secondary | ICD-10-CM | POA: Diagnosis not present

## 2017-11-19 DIAGNOSIS — K589 Irritable bowel syndrome without diarrhea: Secondary | ICD-10-CM | POA: Diagnosis present

## 2017-11-19 DIAGNOSIS — Z8249 Family history of ischemic heart disease and other diseases of the circulatory system: Secondary | ICD-10-CM

## 2017-11-19 DIAGNOSIS — G8929 Other chronic pain: Secondary | ICD-10-CM | POA: Diagnosis not present

## 2017-11-19 DIAGNOSIS — K219 Gastro-esophageal reflux disease without esophagitis: Secondary | ICD-10-CM | POA: Diagnosis present

## 2017-11-19 DIAGNOSIS — Z90710 Acquired absence of both cervix and uterus: Secondary | ICD-10-CM

## 2017-11-19 DIAGNOSIS — I110 Hypertensive heart disease with heart failure: Secondary | ICD-10-CM | POA: Diagnosis not present

## 2017-11-19 DIAGNOSIS — J441 Chronic obstructive pulmonary disease with (acute) exacerbation: Secondary | ICD-10-CM | POA: Diagnosis not present

## 2017-11-19 DIAGNOSIS — F17210 Nicotine dependence, cigarettes, uncomplicated: Secondary | ICD-10-CM | POA: Diagnosis present

## 2017-11-19 DIAGNOSIS — R05 Cough: Secondary | ICD-10-CM | POA: Diagnosis not present

## 2017-11-19 DIAGNOSIS — J111 Influenza due to unidentified influenza virus with other respiratory manifestations: Secondary | ICD-10-CM | POA: Diagnosis present

## 2017-11-19 DIAGNOSIS — F419 Anxiety disorder, unspecified: Secondary | ICD-10-CM | POA: Diagnosis present

## 2017-11-19 DIAGNOSIS — Z886 Allergy status to analgesic agent status: Secondary | ICD-10-CM

## 2017-11-19 DIAGNOSIS — F319 Bipolar disorder, unspecified: Secondary | ICD-10-CM | POA: Diagnosis present

## 2017-11-19 LAB — CBC WITH DIFFERENTIAL/PLATELET
BASOS ABS: 0 10*3/uL (ref 0.0–0.1)
Basophils Relative: 1 %
Eosinophils Absolute: 0 10*3/uL (ref 0.0–0.7)
Eosinophils Relative: 0 %
HEMATOCRIT: 42.4 % (ref 36.0–46.0)
Hemoglobin: 13.7 g/dL (ref 12.0–15.0)
Lymphocytes Relative: 21 %
Lymphs Abs: 1.3 10*3/uL (ref 0.7–4.0)
MCH: 29 pg (ref 26.0–34.0)
MCHC: 32.3 g/dL (ref 30.0–36.0)
MCV: 89.6 fL (ref 78.0–100.0)
Monocytes Absolute: 0.6 10*3/uL (ref 0.1–1.0)
Monocytes Relative: 10 %
NEUTROS ABS: 4.2 10*3/uL (ref 1.7–7.7)
Neutrophils Relative %: 68 %
Platelets: 142 10*3/uL — ABNORMAL LOW (ref 150–400)
RBC: 4.73 MIL/uL (ref 3.87–5.11)
RDW: 14 % (ref 11.5–15.5)
WBC: 6.1 10*3/uL (ref 4.0–10.5)

## 2017-11-19 LAB — BASIC METABOLIC PANEL
ANION GAP: 11 (ref 5–15)
BUN: 8 mg/dL (ref 6–20)
CO2: 30 mmol/L (ref 22–32)
Calcium: 9 mg/dL (ref 8.9–10.3)
Chloride: 95 mmol/L — ABNORMAL LOW (ref 101–111)
Creatinine, Ser: 0.62 mg/dL (ref 0.44–1.00)
GFR calc Af Amer: >60 mL/min (ref >60)
GLUCOSE: 102 mg/dL — AB (ref 65–99)
POTASSIUM: 3.2 mmol/L — AB (ref 3.5–5.1)
Sodium: 136 mmol/L (ref 135–145)

## 2017-11-19 LAB — TROPONIN I: Troponin I: <0.03 ng/mL (ref <0.03)

## 2017-11-19 LAB — INFLUENZA PANEL BY PCR (TYPE A & B)
INFLBPCR: NEGATIVE
Influenza A By PCR: POSITIVE — AB

## 2017-11-19 LAB — PHOSPHORUS: Phosphorus: 3.8 mg/dL (ref 2.5–4.6)

## 2017-11-19 LAB — LACTIC ACID, PLASMA: Lactic Acid, Venous: 0.9 mmol/L (ref 0.5–1.9)

## 2017-11-19 MED ORDER — ONDANSETRON HCL 4 MG/2ML IJ SOLN
4.0000 mg | Freq: Four times a day (QID) | INTRAMUSCULAR | Status: DC | PRN
  Administered 2017-11-22: 4 mg via INTRAVENOUS
  Filled 2017-11-19: qty 2

## 2017-11-19 MED ORDER — ACETAMINOPHEN 325 MG PO TABS
650.0000 mg | ORAL_TABLET | ORAL | Status: DC | PRN
  Administered 2017-11-19 – 2017-11-22 (×2): 650 mg via ORAL
  Filled 2017-11-19 (×2): qty 2

## 2017-11-19 MED ORDER — ENOXAPARIN SODIUM 40 MG/0.4ML ~~LOC~~ SOLN
40.0000 mg | SUBCUTANEOUS | Status: DC
  Administered 2017-11-19: 40 mg via SUBCUTANEOUS
  Filled 2017-11-19: qty 0.4

## 2017-11-19 MED ORDER — SODIUM CHLORIDE 0.9 % IV BOLUS (SEPSIS): 1000.0000 mL | Freq: Once | INTRAVENOUS | Status: DC

## 2017-11-19 MED ORDER — PANTOPRAZOLE SODIUM 40 MG PO TBEC
40.0000 mg | DELAYED_RELEASE_TABLET | Freq: Two times a day (BID) | ORAL | Status: DC
  Administered 2017-11-19 – 2017-11-22 (×6): 40 mg via ORAL
  Filled 2017-11-19 (×6): qty 1

## 2017-11-19 MED ORDER — DILTIAZEM HCL-DEXTROSE 100-5 MG/100ML-% IV SOLN (PREMIX)
5.0000 mg/h | INTRAVENOUS | Status: DC
  Administered 2017-11-19: 5 mg/h via INTRAVENOUS
  Filled 2017-11-19: qty 100

## 2017-11-19 MED ORDER — POTASSIUM CHLORIDE 10 MEQ/100ML IV SOLN
10.0000 meq | INTRAVENOUS | Status: AC
  Administered 2017-11-19 (×2): 10 meq via INTRAVENOUS
  Filled 2017-11-19 (×2): qty 100

## 2017-11-19 MED ORDER — ZOLPIDEM TARTRATE 5 MG PO TABS
5.0000 mg | ORAL_TABLET | Freq: Every evening | ORAL | Status: DC | PRN
Start: 2017-11-19 — End: 2017-11-22
  Administered 2017-11-19 – 2017-11-20 (×2): 5 mg via ORAL
  Filled 2017-11-19 (×2): qty 1

## 2017-11-19 MED ORDER — SODIUM CHLORIDE 0.9 % IV BOLUS (SEPSIS)
500.0000 mL | Freq: Once | INTRAVENOUS | Status: AC
  Administered 2017-11-19: 500 mL via INTRAVENOUS

## 2017-11-19 MED ORDER — FLUTICASONE PROPIONATE 50 MCG/ACT NA SUSP
NASAL | Status: AC
  Filled 2017-11-19: qty 16

## 2017-11-19 MED ORDER — ALBUTEROL SULFATE (2.5 MG/3ML) 0.083% IN NEBU
5.0000 mg | INHALATION_SOLUTION | Freq: Once | RESPIRATORY_TRACT | Status: AC
  Administered 2017-11-19: 2.5 mg via RESPIRATORY_TRACT
  Filled 2017-11-19: qty 6

## 2017-11-19 MED ORDER — SODIUM CHLORIDE 0.9 % IV SOLN
1.0000 g | INTRAVENOUS | Status: DC
  Administered 2017-11-19: 1 g via INTRAVENOUS
  Filled 2017-11-19 (×2): qty 10

## 2017-11-19 MED ORDER — FLUTICASONE PROPIONATE 50 MCG/ACT NA SUSP
2.0000 | Freq: Every day | NASAL | Status: DC
  Administered 2017-11-19 – 2017-11-22 (×4): 2 via NASAL
  Filled 2017-11-19 (×3): qty 16

## 2017-11-19 MED ORDER — ALBUTEROL SULFATE (2.5 MG/3ML) 0.083% IN NEBU: 2.5000 mg | INHALATION_SOLUTION | Freq: Once | RESPIRATORY_TRACT | Status: DC

## 2017-11-19 MED ORDER — MAGNESIUM SULFATE 2 GM/50ML IV SOLN
2.0000 g | Freq: Once | INTRAVENOUS | Status: AC
  Administered 2017-11-19: 2 g via INTRAVENOUS
  Filled 2017-11-19: qty 50

## 2017-11-19 MED ORDER — IPRATROPIUM-ALBUTEROL 0.5-2.5 (3) MG/3ML IN SOLN
3.0000 mL | Freq: Once | RESPIRATORY_TRACT | Status: AC
  Administered 2017-11-19: 3 mL via RESPIRATORY_TRACT
  Filled 2017-11-19: qty 3

## 2017-11-19 MED ORDER — LEVALBUTEROL HCL 1.25 MG/0.5ML IN NEBU
1.2500 mg | INHALATION_SOLUTION | Freq: Three times a day (TID) | RESPIRATORY_TRACT | Status: DC | PRN
Start: 2017-11-19 — End: 2017-11-20

## 2017-11-19 MED ORDER — ALBUTEROL (5 MG/ML) CONTINUOUS INHALATION SOLN
10.0000 mg/h | INHALATION_SOLUTION | Freq: Once | RESPIRATORY_TRACT | Status: AC
  Administered 2017-11-19: 10 mg/h via RESPIRATORY_TRACT
  Filled 2017-11-19: qty 20

## 2017-11-19 MED ORDER — SIMVASTATIN 20 MG PO TABS
40.0000 mg | ORAL_TABLET | Freq: Every day | ORAL | Status: DC
  Administered 2017-11-20: 40 mg via ORAL
  Filled 2017-11-19: qty 4
  Filled 2017-11-19: qty 2

## 2017-11-19 MED ORDER — METHYLPREDNISOLONE SODIUM SUCC 125 MG IJ SOLR
125.0000 mg | Freq: Once | INTRAMUSCULAR | Status: AC
  Administered 2017-11-19: 125 mg via INTRAVENOUS
  Filled 2017-11-19: qty 2

## 2017-11-19 MED ORDER — ACETAMINOPHEN 500 MG PO TABS
1000.0000 mg | ORAL_TABLET | Freq: Once | ORAL | Status: AC
  Administered 2017-11-19: 1000 mg via ORAL
  Filled 2017-11-19: qty 2

## 2017-11-19 MED ORDER — POTASSIUM CHLORIDE CRYS ER 20 MEQ PO TBCR
20.0000 meq | EXTENDED_RELEASE_TABLET | Freq: Once | ORAL | Status: AC
  Administered 2017-11-19: 20 meq via ORAL
  Filled 2017-11-19: qty 1

## 2017-11-19 MED ORDER — ALPRAZOLAM 0.5 MG PO TABS
0.5000 mg | ORAL_TABLET | Freq: Three times a day (TID) | ORAL | Status: DC | PRN
  Administered 2017-11-19 – 2017-11-22 (×3): 0.5 mg via ORAL
  Filled 2017-11-19 (×3): qty 1

## 2017-11-19 NOTE — ED Notes (Signed)
Patient put on 2LPM 02 via nasal cannula.

## 2017-11-19 NOTE — ED Provider Notes (Signed)
[facility] EMERGENCY DEPARTMENT Provider Note   CSN: [id number] Arrival date & time: 11/19/17  1248     History   Chief Complaint Chief Complaint  Patient presents with  . Shortness of Breath    HPI [name] is a 72 y.o. female with a history of COPD and htn presenting with a 3 day history of increased sob with exertion, and describes anterior bilateral chest pain with nonproductive cough.  She has a pulse ox at home and reports her oxygen level has been around 85% since yesterday and reports having a neb machine with 67-50 year old medication (albuterol which belonged to her now deceased husband) which she last took yesterday evening without improvement in sob.  She denies fevers or chills, her cough has been nonproductive. She also took some borrowed antibiotics, maybe keflex, had 2 doses on Friday and one today.  The history is provided by the patient and a relative.    Past Medical History:  Diagnosis Date  . Angina    with indigestion  . Anxiety   . Arthritis    all over esp. in joints  . Complication of anesthesia   . COPD (chronic obstructive pulmonary disease) ([place])   . Depression   . Family history of adverse reaction to anesthesia   . GERD (gastroesophageal reflux disease)   . Headache(784.0)    normal h/a  . HPV in female   . HTN (hypertension)   . Hypercholesteremia   . Hypercholesterolemia   . IBS (irritable bowel syndrome)   . Pneumonia 2007  . PONV (postoperative nausea and vomiting)   . S/P colonoscopy 05/30/2008   Dr. Deatra Ina: (records reviewed by AS, NP) findings normal to cecum, scattered diverticula in sigmoid colon   . S/P endoscopy 05/15/2008   Dr. Deatra Ina: (records reviewed by AS,NP): [facility] dilator for stricture distal esophagus, also noted duodenal stricture, op notes state unable to pass 68mm scope through pylorus  . Shortness of breath    with exertion  . Stomach ulcer     Patient Active Problem List   Diagnosis Date Noted  . COPD  exacerbation ([place]) 11/19/2017  . Aortic atherosclerosis ([place]) 08/03/2017  . Smoker 07/28/2016  . BMI 32.0-32.9,adult 06/23/2015  . Depression 02/23/2015  . Hyperlipidemia with target LDL less than 100 02/23/2015  . COPD (chronic obstructive pulmonary disease) with chronic bronchitis ([place]) 11/26/2012  . Anxiety 11/26/2012  . Hypokalemia 11/26/2012  . Benign hypertension 11/26/2012  . Chronic lower back pain 11/26/2012  . GERD (gastroesophageal reflux disease) 12/29/2011  . Chronic diarrhea 11/09/2011    Past Surgical History:  Procedure Laterality Date  . APPENDECTOMY     72 years old  . BACK SURGERY     X 2  . BIOPSY  08/11/2016   Procedure: BIOPSY;  Surgeon: Daneil Dolin, MD;  Location: [facility] ENDO SUITE;  Service: Endoscopy;;  gastric bx's  . BREAST SURGERY     biopsy only  . CARPAL TUNNEL RELEASE     right hand  . CERVICAL BIOPSY  W/ LOOP ELECTRODE EXCISION  02/2011   CIN-2 with focal ectocervical margin involvement. With CIN-1 ECC negative  . CHOLECYSTECTOMY  1984  . ESOPHAGOGASTRODUODENOSCOPY  12/01/2011   Hiatal hernia/Mild fibrotic pyloric stenosis status post dilation with passage of  the scope.  The previously noted gastric ulcer completely healed  The remainder of the gastric mucosa appeared normal  . ESOPHAGOGASTRODUODENOSCOPY (EGD) WITH PROPOFOL N/A 08/11/2016   Procedure: ESOPHAGOGASTRODUODENOSCOPY (EGD) WITH PROPOFOL;  Surgeon:  Daneil Dolin, MD;  Location: [facility] ENDO SUITE;  Service: Endoscopy;  Laterality: N/A;  2:15 PM  . FRACTURE SURGERY  10/18/2016   left knee in 3 places  . MALONEY DILATION  07/28/2011   Schatzki's ring s/p 59F with small UES tear as well, small hh, 1cm pyloric channel ulcer, bx benign without H.Pylori. Mobic/goody's at time.  Marland Kitchen MALONEY DILATION N/A 08/11/2016   Procedure: Venia Minks DILATION;  Surgeon: Daneil Dolin, MD;  Location: [facility] ENDO SUITE;  Service: Endoscopy;  Laterality: N/A;  . ORIF PATELLA Left 10/19/2016   Procedure: OPEN REDUCTION  INTERNAL (ORIF) FIXATION PATELLA;  Surgeon: Carole Civil, MD;  Location: [facility] ORS;  Service: Orthopedics;  Laterality: Left;  . SPINE SURGERY  2009   cervical disc  . SPINE SURGERY  2009   lumbar  . TUBAL LIGATION  1976  . tumor removed from arm      OB History    Gravida Para Term Preterm AB Living   4 4       5    SAB TAB Ectopic Multiple Live Births                   Home Medications    Prior to Admission medications   Medication Sig Start Date End Date Taking? Authorizing Provider  ALPRAZolam Duanne Moron) 0.5 MG tablet Take 1 tablet (0.5 mg total) by mouth 3 (three) times daily as needed for anxiety. 10/16/17  Yes Martin, Mary-Margaret, FNP  Aspirin-Acetaminophen-Caffeine (GOODYS EXTRA STRENGTH) [phone] MG PACK Take 1 packet by mouth every 8 (eight) hours as needed (for pain.).   Yes [provider]  aspirin-sod bicarb-citric acid (ALKA-SELTZER) 325 MG TBEF tablet Take 325 mg by mouth every 6 (six) hours as needed (cold/congestion).   Yes [provider]  calcium carbonate (OS-CAL) 600 MG TABS Take 600 mg by mouth daily.    Yes [provider]  Cholecalciferol (VITAMIN D3) 1000 UNITS CAPS Take 3,000 mg by mouth daily.    Yes [provider]  dicyclomine (BENTYL) 10 MG capsule Take 1 capsule (10 mg total) by mouth 4 (four) times daily as needed for spasms. 10/16/17  Yes Martin, Mary-Margaret, FNP  diphenhydrAMINE (BENADRYL) 25 MG tablet Take 25 mg by mouth 2 (two) times daily as needed. For Allergies   Yes [provider]  fluticasone (FLONASE) 50 MCG/ACT nasal spray Place 2 sprays into both nostrils daily. 01/19/17  Yes Hassell Done, Mary-Margaret, FNP  HYDROcodone-acetaminophen (NORCO) 10-325 MG tablet Take 1 tablet by mouth 3 (three) times daily. 10/16/17  Yes Martin, Mary-Margaret, FNP  lisinopril-hydrochlorothiazide (PRINZIDE,ZESTORETIC) 10-12.5 MG tablet Take 1 tablet by mouth daily. 10/16/17  Yes Hassell Done, Mary-Margaret, FNP  Multiple Vitamin  (MULITIVITAMIN WITH MINERALS) TABS Take 1 tablet by mouth daily.   Yes [provider]  pantoprazole (PROTONIX) 40 MG tablet Take 1 tablet (40 mg total) by mouth 2 (two) times daily. 10/16/17  Yes Martin, Mary-Margaret, FNP  PARoxetine (PAXIL) 20 MG tablet Take 1 tablet (20 mg total) by mouth daily. 10/16/17  Yes Hassell Done, Mary-Margaret, FNP  PROAIR HFA 108 [phone] Base) MCG/ACT inhaler Inhale 2 puffs by mouth every 6 hours as needed for wheezing or shortness of breath 04/20/17  Yes Hassell Done, Mary-Margaret, FNP  simvastatin (ZOCOR) 40 MG tablet Take 1 tablet (40 mg total) by mouth at bedtime. 10/16/17  Yes Martin, Mary-Margaret, FNP  vitamin C (ASCORBIC ACID) 500 MG tablet Take 1,000 mg by mouth daily.    Yes [provider]  Family History Family History  Problem Relation Age of Onset  . Heart disease Father        CHF  . Diabetes Sister   . Heart disease Mother   . COPD Sister   . COPD Sister   . COPD Sister   . Stroke Brother   . Colon cancer Neg Hx   . Anesthesia problems Neg Hx   . Hypotension Neg Hx   . Malignant hyperthermia Neg Hx   . Pseudochol deficiency Neg Hx   . Colon polyps Neg Hx     Social History Social History   Tobacco Use  . Smoking status: Current Every Day Smoker    Packs/day: 1.00    Years: 50.00    Pack years: 50.00    Types: Cigarettes  . Smokeless tobacco: Never Used  . Tobacco comment: since teenager  Substance Use Topics  . Alcohol use: No    Comment: hx of ETOH abuse in past, none in 10 years  . Drug use: No     Allergies   Meloxicam   Review of Systems Review of Systems  Constitutional: Positive for fatigue. Negative for chills and fever.  HENT: Positive for sore throat. Negative for congestion.   Eyes: Negative.   Respiratory: Positive for cough, chest tightness, shortness of breath and wheezing.   Cardiovascular: Positive for chest pain. Negative for palpitations and leg swelling.  Gastrointestinal: Negative for abdominal  pain, nausea and vomiting.  Genitourinary: Negative.   Musculoskeletal: Negative for arthralgias, joint swelling and neck pain.  Skin: Negative.  Negative for rash and wound.  Neurological: Negative for dizziness, weakness, light-headedness, numbness and headaches.  Psychiatric/Behavioral: Negative.      Physical Exam Updated Vital Signs BP 124/74   Pulse (!) 145   Temp 98.2 F (36.8 C) (Oral)   Resp (!) 21   Ht 5\' 5"  (1.651 m)   Wt 79.4 kg (175 lb)   SpO2 100%   BMI 29.12 kg/m   Physical Exam  Constitutional: She appears well-developed and well-nourished.  HENT:  Head: Normocephalic and atraumatic.  Eyes: Conjunctivae are normal.  Neck: Normal range of motion.  Cardiovascular: Normal rate, regular rhythm, normal heart sounds and intact distal pulses.  Pulmonary/Chest: Accessory muscle usage present. Tachypnea noted. She is in respiratory distress. She has wheezes.  Wheezing throughout all lung fields.   Abdominal: Soft. Bowel sounds are normal. There is no tenderness.  Musculoskeletal: Normal range of motion.  Neurological: She is alert.  Skin: Skin is warm and dry.  Psychiatric: She has a normal mood and affect.  Nursing note and vitals reviewed.    ED Treatments / Results  Labs (all labs ordered are listed, but only abnormal results are displayed) Labs Reviewed  CBC WITH DIFFERENTIAL/PLATELET - Abnormal; Notable for the following components:      Result Value   Platelets 142 (*)    All other components within normal limits  BASIC METABOLIC PANEL - Abnormal; Notable for the following components:   Potassium 3.2 (*)    Chloride 95 (*)    Glucose, Bld 102 (*)    All other components within normal limits  TROPONIN I  LACTIC ACID, PLASMA  INFLUENZA PANEL BY PCR (TYPE A & B)    EKG  EKG Interpretation  Date/Time:  Sunday November 19 2017 13:00:47 EST Ventricular Rate:  71 PR Interval:  188 QRS Duration: 102 QT Interval:  500 QTC Calculation: 543 R  Axis:   -24 Text Interpretation:  Normal  sinus rhythm Right atrial enlargement Nonspecific ST and T wave abnormality Abnormal ECG Confirmed by Pattricia Boss [phone]) on 11/19/2017 1:29:53 PM       Radiology Dg Chest 2 View  Result Date: 11/19/2017 CLINICAL DATA:  Chest pain, shortness of breath cough EXAM: CHEST  2 VIEW COMPARISON:  07/28/2006 FINDINGS: Normal heart size and vascularity. Minor vascular and interstitial prominence, nonspecific. No focal pneumonia, collapse or consolidation. Negative for edema, effusion or pneumothorax. Trachea is midline. Aorta is atherosclerotic. Lower cervical fusion hardware noted. No acute osseous finding. IMPRESSION: Stable chest exam.  No acute process. Thoracic aortic atherosclerosis Electronically Signed   By: Jerilynn Mages.  Shick M.D.   On: 11/19/2017 14:08    Procedures Procedures (including critical care time)  Medications Ordered in ED Medications  sodium chloride 0.9 % bolus 500 mL (500 mLs Intravenous New Bag/Given 11/19/17 1604)  magnesium sulfate IVPB 2 g 50 mL (2 g Intravenous New Bag/Given 11/19/17 1625)  potassium chloride 10 mEq in 100 mL IVPB (10 mEq Intravenous New Bag/Given 11/19/17 1618)  methylPREDNISolone sodium succinate (SOLU-MEDROL) 125 mg/2 mL injection 125 mg (not administered)  cefTRIAXone (ROCEPHIN) 1 g in sodium chloride 0.9 % 100 mL IVPB (not administered)  levalbuterol (XOPENEX) nebulizer solution 1.25 mg (not administered)  albuterol (PROVENTIL) (2.5 MG/3ML) 0.083% nebulizer solution 5 mg (2.5 mg Nebulization Given 11/19/17 1326)  ipratropium-albuterol (DUONEB) 0.5-2.5 (3) MG/3ML nebulizer solution 3 mL (3 mLs Nebulization Given 11/19/17 1326)  methylPREDNISolone sodium succinate (SOLU-MEDROL) 125 mg/2 mL injection 125 mg (125 mg Intravenous Given 11/19/17 1336)  albuterol (PROVENTIL,VENTOLIN) solution continuous neb (10 mg/hr Nebulization Given 11/19/17 1442)  acetaminophen (TYLENOL) tablet 1,000 mg (1,000 mg Oral Given 11/19/17 1551)   potassium chloride SA (K-DUR,KLOR-CON) CR tablet 20 mEq (20 mEq Oral Given 11/19/17 1619)     Initial Impression / Assessment and Plan / ED Course  I have reviewed the triage vital signs and the nursing notes.  Pertinent labs & imaging results that were available during my care of the patient were reviewed by me and considered in my medical decision making (see chart for details).     Pt received solumedrol and albuterol/atrovent neb tx with no significant improvement in wheezing or sob. She was then given an hour long neb tx. approx 1/2 way through the tx, she developed a sinus tachycardia, rate up to 220 on monitor, brief episodes that settle back to 120-130 range.  Denies cp, denies any worsened sob. Repeat ekg sinus tachy.  No h/o a fib. , not on blood thinners.   Lung exam with improved wheezing and improved aeration, rhonchi.  Albuterol hour long neb stopped once she developed tachycardia.  Will plan admission for further eval and tx of suspected COPD flare.  Pt was seen by Dr Jeanell Sparrow during this visit.  Discussed with Dr. Aggie Moats who agrees with admission.  Final Clinical Impressions(s) / ED Diagnoses   Final diagnoses:  COPD exacerbation ([place])  Paroxysmal SVT (supraventricular tachycardia) [facility])    ED Discharge Orders    None       Landis Martins 11/19/17 1634    Pattricia Boss, MD 11/20/17 [phone]

## 2017-11-19 NOTE — H&P (Signed)
[facility] History and Physical  [name]:[id number] DOB: August 01, 1946 DOA: 11/19/2017  Referring physician:  PCP: Chevis Pretty, FNP   Chief Complaint: "I just got wore down."  HPI: [name] is a 72 y.o. female with past medical history for anxiety, COPD, hypertension presents with shortness of breath.  Patient states she has been having shortness of breath for greater than 2 days.  Prior to that she was with her sister who was sick with upper respiratory tract infection.  Patient has had a cough.  Denies fever and chills.  Patient does not wear oxygen at home.  Patient came to the hospital for evaluation due to shortness of breath not improving.  Course: Patient treated for COPD exacerbation with steroids and multiple breathing treatments.  Symptoms did not improved.  She noted to be tachycardic in the 140s in ED.  Hospitliast was consulted for admission.   Review of Systems:  As per HPI otherwise 10 point review of systems negative.    Past Medical History:  Diagnosis Date  . Angina    with indigestion  . Anxiety   . Arthritis    all over esp. in joints  . Complication of anesthesia   . COPD (chronic obstructive pulmonary disease) ([place])   . Depression   . Family history of adverse reaction to anesthesia   . GERD (gastroesophageal reflux disease)   . Headache(784.0)    normal h/a  . HPV in female   . HTN (hypertension)   . Hypercholesteremia   . Hypercholesterolemia   . IBS (irritable bowel syndrome)   . Pneumonia 2007  . PONV (postoperative nausea and vomiting)   . S/P colonoscopy 05/30/2008   Dr. Deatra Ina: (records reviewed by AS, NP) findings normal to cecum, scattered diverticula in sigmoid colon   . S/P endoscopy 05/15/2008   Dr. Deatra Ina: (records reviewed by AS,NP): [facility] dilator for stricture distal esophagus, also noted duodenal stricture, op notes state unable to pass 76mm scope through pylorus  . Shortness of breath    with  exertion  . Stomach ulcer    Past Surgical History:  Procedure Laterality Date  . APPENDECTOMY     72 years old  . BACK SURGERY     X 2  . BIOPSY  08/11/2016   Procedure: BIOPSY;  Surgeon: Daneil Dolin, MD;  Location: [facility];  Service: Endoscopy;;  gastric bx's  . BREAST SURGERY     biopsy only  . CARPAL TUNNEL RELEASE     right hand  . CERVICAL BIOPSY  W/ LOOP ELECTRODE EXCISION  02/2011   CIN-2 with focal ectocervical margin involvement. With CIN-1 ECC negative  . CHOLECYSTECTOMY  1984  . ESOPHAGOGASTRODUODENOSCOPY  12/01/2011   Hiatal hernia/Mild fibrotic pyloric stenosis status post dilation with passage of  the scope.  The previously noted gastric ulcer completely healed  The remainder of the gastric mucosa appeared normal  . ESOPHAGOGASTRODUODENOSCOPY (EGD) WITH PROPOFOL N/A 08/11/2016   Procedure: ESOPHAGOGASTRODUODENOSCOPY (EGD) WITH PROPOFOL;  Surgeon: Daneil Dolin, MD;  Location[facility];  Service: Endoscopy;  Laterality: N/A;  2:15 PM  . FRACTURE SURGERY  10/18/2016   left knee in 3 places  . MALONEY DILATION  07/28/2011   Schatzki's ring s/p 72F with small UES tear as well, small hh, 1cm pyloric channel ulcer, bx benign without H.Pylori. Mobic/goody's at time.  Marland Kitchen MALONEY DILATION N/A 08/11/2016   Procedure: Venia Minks DILATION;  Surgeon: Daneil Dolin, MD;  Location: [facility] ENDO  SU[facility];  Service: Endoscopy;  Laterality: N/A;  . ORIF PATELLA Left 10/19/2016   Procedure: OPEN REDUCTION INTERNAL (ORIF) FIXATION PATELLA;  Surgeon: Carole Civil, MD;  Location: [facility] ORS;  Service: Orthopedics;  Laterality: Left;  . SPINE SURGERY  2009   cervical disc  . SPINE SURGERY  2009   lumbar  . TUBAL LIGATION  1976  . tumor removed from arm     Social History:  reports that she has been smoking cigarettes.  She has a 50.00 pack-year smoking history. she has never used smokeless tobacco. She reports that she does not drink alcohol or use drugs.  Allergies  Allergen  Reactions  . Meloxicam Other (See Comments)    Stomach ulcers    Family History  Problem Relation Age of Onset  . Heart disease Father        CHF  . Diabetes Sister   . Heart disease Mother   . COPD Sister   . COPD Sister   . COPD Sister   . Stroke Brother   . Colon cancer Neg Hx   . Anesthesia problems Neg Hx   . Hypotension Neg Hx   . Malignant hyperthermia Neg Hx   . Pseudochol deficiency Neg Hx   . Colon polyps Neg Hx      Prior to Admission medications   Medication Sig Start Date End Date Taking? Authorizing Provider  ALPRAZolam Duanne Moron) 0.5 MG tablet Take 1 tablet (0.5 mg total) by mouth 3 (three) times daily as needed for anxiety. 10/16/17  Yes Martin, Mary-Margaret, FNP  Aspirin-Acetaminophen-Caffeine (GOODYS EXTRA STRENGTH) [phone] MG PACK Take 1 packet by mouth every 8 (eight) hours as needed (for pain.).   Yes [provider]  aspirin-sod bicarb-citric acid (ALKA-SELTZER) 325 MG TBEF tablet Take 325 mg by mouth every 6 (six) hours as needed (cold/congestion).   Yes [provider]  calcium carbonate (OS-CAL) 600 MG TABS Take 600 mg by mouth daily.    Yes [provider]  Cholecalciferol (VITAMIN D3) 1000 UNITS CAPS Take 3,000 mg by mouth daily.    Yes [provider]  dicyclomine (BENTYL) 10 MG capsule Take 1 capsule (10 mg total) by mouth 4 (four) times daily as needed for spasms. 10/16/17  Yes Martin, Mary-Margaret, FNP  diphenhydrAMINE (BENADRYL) 25 MG tablet Take 25 mg by mouth 2 (two) times daily as needed. For Allergies   Yes [provider]  fluticasone (FLONASE) 50 MCG/ACT nasal spray Place 2 sprays into both nostrils daily. 01/19/17  Yes Hassell Done, Mary-Margaret, FNP  HYDROcodone-acetaminophen (NORCO) 10-325 MG tablet Take 1 tablet by mouth 3 (three) times daily. 10/16/17  Yes Martin, Mary-Margaret, FNP  lisinopril-hydrochlorothiazide (PRINZIDE,ZESTORETIC) 10-12.5 MG tablet Take 1 tablet by mouth daily. 10/16/17  Yes Hassell Done,  Mary-Margaret, FNP  Multiple Vitamin (MULITIVITAMIN WITH MINERALS) TABS Take 1 tablet by mouth daily.   Yes [provider]  pantoprazole (PROTONIX) 40 MG tablet Take 1 tablet (40 mg total) by mouth 2 (two) times daily. 10/16/17  Yes Martin, Mary-Margaret, FNP  PARoxetine (PAXIL) 20 MG tablet Take 1 tablet (20 mg total) by mouth daily. 10/16/17  Yes Hassell Done, Mary-Margaret, FNP  PROAIR HFA 108 [phone] Base) MCG/ACT inhaler Inhale 2 puffs by mouth every 6 hours as needed for wheezing or shortness of breath 04/20/17  Yes Hassell Done, Mary-Margaret, FNP  simvastatin (ZOCOR) 40 MG tablet Take 1 tablet (40 mg total) by mouth at bedtime. 10/16/17  Yes Hassell Done, Mary-Margaret, FNP  vitamin C (ASCORBIC ACID) 500 MG tablet Take  1,000 mg by mouth daily.    Yes [provider]   Physical Exam: Vitals:   11/19/17 1442 11/19/17 1512 11/19/17 1536 11/19/17 1600  BP:  (!) 153/114  124/74  Pulse:    (!) 145  Resp:  17 (!) 28 (!) 21  Temp:      TempSrc:      SpO2: 96%   100%  Weight:      Height:        Wt Readings from Last 3 Encounters:  11/19/17 79.4 kg (175 lb)  10/16/17 79.8 kg (176 lb)  07/12/17 80.3 kg (177 lb)    General:  Appears calm and comfortable; A&Ox3 Eyes:  PERRL, EOMI, normal lids, iris ENT:  grossly normal hearing, lips & tongue Neck:  no LAD, masses or thyromegaly Cardiovascular:  Tachy, RR, no m/r/g. No LE edema.  Respiratory:  incr wob, wheeze, rhonchi. Abdomen:  soft, ntnd Skin:  no rash or induration seen on limited exam Musculoskeletal:  grossly normal tone BUE/BLE Psychiatric:  grossly normal mood and affect, speech fluent and appropriate Neurologic:  CN 2-12 grossly intact, moves all extremities in coordinated fashion.          Labs on Admission:  Basic Metabolic Panel: Recent Labs  Lab 11/19/17 1315  NA 136  K 3.2*  CL 95*  CO2 30  GLUCOSE 102*  BUN 8  CREATININE 0.62  CALCIUM 9.0   Liver Function Tests: No results for input(s): AST, ALT, ALKPHOS,  BILITOT, PROT, ALBUMIN in the last 168 hours. No results for input(s): LIPASE, AMYLASE in the last 168 hours. No results for input(s): AMMONIA in the last 168 hours. CBC: Recent Labs  Lab 11/19/17 1315  WBC 6.1  NEUTROABS 4.2  HGB 13.7  HCT 42.4  MCV 89.6  PLT 142*   Cardiac Enzymes: Recent Labs  Lab 11/19/17 1315  TROPONINI <0.03    BNP (last 3 results) No results for input(s): BNP in the last 8760 hours.  ProBNP (last 3 results) No results for input(s): PROBNP in the last 8760 hours.   Serum creatinine: 0.62 mg/dL 11/19/17 1315 Estimated creatinine clearance: 68.2 mL/min  CBG: No results for input(s): GLUCAP in the last 168 hours.  Radiological Exams on Admission: Dg Chest 2 View  Result Date: 11/19/2017 CLINICAL DATA:  Chest pain, shortness of breath cough EXAM: CHEST  2 VIEW COMPARISON:  07/28/2006 FINDINGS: Normal heart size and vascularity. Minor vascular and interstitial prominence, nonspecific. No focal pneumonia, collapse or consolidation. Negative for edema, effusion or pneumothorax. Trachea is midline. Aorta is atherosclerotic. Lower cervical fusion hardware noted. No acute osseous finding. IMPRESSION: Stable chest exam.  No acute process. Thoracic aortic atherosclerosis Electronically Signed   By: Jerilynn Mages.  Shick M.D.   On: 11/19/2017 14:08    EKG: Independently reviewed. Sinus tach.  Assessment/Plan Active Problems:   COPD exacerbation (HCC)  COPD AE IV steroids Scheduled DuoNeb's When necessary xopenex Antibiotic- rocephin Oxygen therapy Continuous pulse oximetry  Tachycardia Afib vs other SVT Dilt drip to slow rate due to soft BP Also giving bolus of IVF Consider cardiology consult  Hypertension When necessary hydralazine 10 mg IV as needed for severe blood pressure Hold prnzide  GERD Cont PPI  Allergies Qd flonase  Bipolar Hold paxil  Chronic back pain Hold oral norco  Hyperlipidemia Continue statin   Code Status: DNR DVT  Prophylaxis: lovenox Family Communication: none available Disposition Plan: Pending Improvement  Status: sdu inpt  Elwin Mocha, MD Family Medicine [facility]  [email or web address] Password [email or web address]

## 2017-11-19 NOTE — ED Triage Notes (Addendum)
Pt reports increased SOB and chest pain with coughing since Thursday. Hx of COPD. Pt does not wear oxygen. States sats have been around 85% at home. Pt 79% on RA in triage. Pt placed on 2L nasal cannula, sats 87%. Pt now on 4L Sats 92%.

## 2017-11-19 NOTE — ED Notes (Signed)
Pt states improvement in breathing at this time while receiving hour long nebulizer.

## 2017-11-20 ENCOUNTER — Other Ambulatory Visit (HOSPITAL_COMMUNITY): Payer: Medicare Other

## 2017-11-20 ENCOUNTER — Other Ambulatory Visit: Payer: Self-pay

## 2017-11-20 ENCOUNTER — Encounter (HOSPITAL_COMMUNITY): Payer: Self-pay

## 2017-11-20 ENCOUNTER — Telehealth: Payer: Self-pay | Admitting: Emergency Medicine

## 2017-11-20 DIAGNOSIS — F419 Anxiety disorder, unspecified: Secondary | ICD-10-CM | POA: Diagnosis present

## 2017-11-20 DIAGNOSIS — M545 Low back pain: Secondary | ICD-10-CM | POA: Diagnosis present

## 2017-11-20 DIAGNOSIS — I5030 Unspecified diastolic (congestive) heart failure: Secondary | ICD-10-CM | POA: Diagnosis not present

## 2017-11-20 DIAGNOSIS — E7800 Pure hypercholesterolemia, unspecified: Secondary | ICD-10-CM | POA: Diagnosis present

## 2017-11-20 DIAGNOSIS — I509 Heart failure, unspecified: Secondary | ICD-10-CM | POA: Diagnosis present

## 2017-11-20 DIAGNOSIS — F319 Bipolar disorder, unspecified: Secondary | ICD-10-CM | POA: Diagnosis present

## 2017-11-20 DIAGNOSIS — Z7951 Long term (current) use of inhaled steroids: Secondary | ICD-10-CM | POA: Diagnosis not present

## 2017-11-20 DIAGNOSIS — E876 Hypokalemia: Secondary | ICD-10-CM | POA: Diagnosis present

## 2017-11-20 DIAGNOSIS — J9601 Acute respiratory failure with hypoxia: Secondary | ICD-10-CM | POA: Diagnosis present

## 2017-11-20 DIAGNOSIS — Z79899 Other long term (current) drug therapy: Secondary | ICD-10-CM | POA: Diagnosis not present

## 2017-11-20 DIAGNOSIS — E785 Hyperlipidemia, unspecified: Secondary | ICD-10-CM | POA: Diagnosis present

## 2017-11-20 DIAGNOSIS — M549 Dorsalgia, unspecified: Secondary | ICD-10-CM | POA: Diagnosis present

## 2017-11-20 DIAGNOSIS — F17210 Nicotine dependence, cigarettes, uncomplicated: Secondary | ICD-10-CM | POA: Diagnosis present

## 2017-11-20 DIAGNOSIS — I471 Supraventricular tachycardia: Secondary | ICD-10-CM | POA: Diagnosis not present

## 2017-11-20 DIAGNOSIS — Z66 Do not resuscitate: Secondary | ICD-10-CM | POA: Diagnosis present

## 2017-11-20 DIAGNOSIS — I110 Hypertensive heart disease with heart failure: Secondary | ICD-10-CM | POA: Diagnosis present

## 2017-11-20 DIAGNOSIS — I700 Atherosclerosis of aorta: Secondary | ICD-10-CM | POA: Diagnosis present

## 2017-11-20 DIAGNOSIS — J101 Influenza due to other identified influenza virus with other respiratory manifestations: Secondary | ICD-10-CM | POA: Diagnosis present

## 2017-11-20 DIAGNOSIS — Z8249 Family history of ischemic heart disease and other diseases of the circulatory system: Secondary | ICD-10-CM | POA: Diagnosis not present

## 2017-11-20 DIAGNOSIS — Z90710 Acquired absence of both cervix and uterus: Secondary | ICD-10-CM | POA: Diagnosis not present

## 2017-11-20 DIAGNOSIS — K219 Gastro-esophageal reflux disease without esophagitis: Secondary | ICD-10-CM | POA: Diagnosis present

## 2017-11-20 DIAGNOSIS — Z825 Family history of asthma and other chronic lower respiratory diseases: Secondary | ICD-10-CM | POA: Diagnosis not present

## 2017-11-20 DIAGNOSIS — J111 Influenza due to unidentified influenza virus with other respiratory manifestations: Secondary | ICD-10-CM | POA: Diagnosis present

## 2017-11-20 DIAGNOSIS — G8929 Other chronic pain: Secondary | ICD-10-CM | POA: Diagnosis present

## 2017-11-20 DIAGNOSIS — K589 Irritable bowel syndrome without diarrhea: Secondary | ICD-10-CM | POA: Diagnosis present

## 2017-11-20 DIAGNOSIS — J441 Chronic obstructive pulmonary disease with (acute) exacerbation: Secondary | ICD-10-CM | POA: Diagnosis not present

## 2017-11-20 LAB — CBC
HCT: 41.5 % (ref 36.0–46.0)
Hemoglobin: 13.1 g/dL (ref 12.0–15.0)
MCH: 28.3 pg (ref 26.0–34.0)
MCHC: 31.6 g/dL (ref 30.0–36.0)
MCV: 89.6 fL (ref 78.0–100.0)
PLATELETS: 156 10*3/uL (ref 150–400)
RBC: 4.63 MIL/uL (ref 3.87–5.11)
RDW: 14 % (ref 11.5–15.5)
WBC: 6.4 10*3/uL (ref 4.0–10.5)

## 2017-11-20 LAB — BASIC METABOLIC PANEL
Anion gap: 11 (ref 5–15)
BUN: 6 mg/dL (ref 6–20)
CALCIUM: 9 mg/dL (ref 8.9–10.3)
CHLORIDE: 97 mmol/L — AB (ref 101–111)
CO2: 30 mmol/L (ref 22–32)
CREATININE: 0.48 mg/dL (ref 0.44–1.00)
GFR calc non Af Amer: >60 mL/min (ref >60)
Glucose, Bld: 153 mg/dL — ABNORMAL HIGH (ref 65–99)
Potassium: 3.8 mmol/L (ref 3.5–5.1)
Sodium: 138 mmol/L (ref 135–145)

## 2017-11-20 LAB — TSH: TSH: 2.706 u[IU]/mL (ref 0.350–4.500)

## 2017-11-20 MED ORDER — LISINOPRIL 10 MG PO TABS
10.0000 mg | ORAL_TABLET | Freq: Every day | ORAL | Status: DC
  Administered 2017-11-20 – 2017-11-22 (×3): 10 mg via ORAL
  Filled 2017-11-20 (×3): qty 1

## 2017-11-20 MED ORDER — OSELTAMIVIR PHOSPHATE 75 MG PO CAPS
75.0000 mg | ORAL_CAPSULE | Freq: Two times a day (BID) | ORAL | Status: DC
  Administered 2017-11-20 – 2017-11-22 (×5): 75 mg via ORAL
  Filled 2017-11-20 (×5): qty 1

## 2017-11-20 MED ORDER — HYDROCHLOROTHIAZIDE 12.5 MG PO CAPS
12.5000 mg | ORAL_CAPSULE | Freq: Every day | ORAL | Status: DC
  Administered 2017-11-20 – 2017-11-22 (×3): 12.5 mg via ORAL
  Filled 2017-11-20 (×3): qty 1

## 2017-11-20 MED ORDER — IPRATROPIUM BROMIDE 0.02 % IN SOLN
0.5000 mg | Freq: Four times a day (QID) | RESPIRATORY_TRACT | Status: DC | PRN
  Filled 2017-11-20: qty 2.5

## 2017-11-20 MED ORDER — ORAL CARE MOUTH RINSE
15.0000 mL | Freq: Two times a day (BID) | OROMUCOSAL | Status: DC
  Administered 2017-11-21 (×2): 15 mL via OROMUCOSAL

## 2017-11-20 MED ORDER — HYDROCODONE-ACETAMINOPHEN 10-325 MG PO TABS
1.0000 | ORAL_TABLET | Freq: Three times a day (TID) | ORAL | Status: DC
  Administered 2017-11-20 – 2017-11-22 (×6): 1 via ORAL
  Filled 2017-11-20 (×6): qty 1

## 2017-11-20 MED ORDER — LISINOPRIL-HYDROCHLOROTHIAZIDE 10-12.5 MG PO TABS: 1.0000 | ORAL_TABLET | Freq: Every day | ORAL | Status: DC

## 2017-11-20 MED ORDER — METHYLPREDNISOLONE SODIUM SUCC 40 MG IJ SOLR
40.0000 mg | Freq: Two times a day (BID) | INTRAMUSCULAR | Status: DC
  Administered 2017-11-20 – 2017-11-21 (×3): 40 mg via INTRAVENOUS
  Filled 2017-11-20 (×3): qty 1

## 2017-11-20 MED ORDER — DILTIAZEM HCL 30 MG PO TABS
30.0000 mg | ORAL_TABLET | Freq: Four times a day (QID) | ORAL | Status: DC
  Administered 2017-11-20 – 2017-11-21 (×5): 30 mg via ORAL
  Filled 2017-11-20 (×5): qty 1

## 2017-11-20 MED ORDER — ENOXAPARIN SODIUM 40 MG/0.4ML ~~LOC~~ SOLN
40.0000 mg | Freq: Once | SUBCUTANEOUS | Status: AC
  Administered 2017-11-20: 40 mg via SUBCUTANEOUS
  Filled 2017-11-20: qty 0.4

## 2017-11-20 MED ORDER — PAROXETINE HCL 20 MG PO TABS
20.0000 mg | ORAL_TABLET | Freq: Every day | ORAL | Status: DC
  Administered 2017-11-20 – 2017-11-22 (×3): 20 mg via ORAL
  Filled 2017-11-20 (×3): qty 1

## 2017-11-20 MED ORDER — LEVALBUTEROL HCL 1.25 MG/0.5ML IN NEBU
1.2500 mg | INHALATION_SOLUTION | Freq: Four times a day (QID) | RESPIRATORY_TRACT | Status: DC | PRN
Start: 2017-11-20 — End: 2017-11-22
  Filled 2017-11-20: qty 0.5

## 2017-11-20 NOTE — ED Notes (Signed)
Pt requesting coffee. This RN advised her that she could have decaf. Tameika, NT gave the pt her coffee and shortly after, the pt called out to the nurses station. This RN went in to check on the pt and the pt had spilled her whole cup of coffee on the floor. Tameika, NT generously went in a cleaned the coffee spill up for this RN.

## 2017-11-20 NOTE — ED Notes (Addendum)
Respiratory paged again to remind them that the pt needed xopenex and atrovent

## 2017-11-20 NOTE — ED Notes (Addendum)
Cardizem stopped by Ginger RN at 2324. Ginger RN stated that the patients heart rate had remained in the 70-80's for several hours prior to stopping meds. This RN received report on patient at 5am. Pt's heart rate has remained between 70 & 80's since 5am.

## 2017-11-20 NOTE — ED Notes (Signed)
Patient states she does not want to stay in hospital. Reasons for admission restated to patient. Patient states she wants to talk to admitting physician.

## 2017-11-20 NOTE — ED Notes (Signed)
Respiratory was paged and notified of new orders.

## 2017-11-20 NOTE — ED Notes (Addendum)
This RN had just gotten report on this pt. Pt came out in the hallway with her jacket on. Pt had obviously unhooked her self from the monitor, oxygen, bp, etc. Marjorie Smolder, NT/Sec escorted pt back to the room. This RN placed the pt backed on the monitor, bp cuff, pulse ox which her 02 was reading 78% on RA. Pt was wheezing and sob. This RN placed pt on 2L o2 and her oxygen remained in the 70's. This RN increased o2 to 4L and that pt's o2 increased to 97%. EDP made aware as well as Dr. Olevia Bowens.  Dr. Olevia Bowens changed her xopenex order from q 8 hrs to q 6 hrs and ordered her atrovent.

## 2017-11-20 NOTE — Progress Notes (Signed)
PROGRESS NOTE    [name]  LPF:[id number] DOB: [birth year] DOA: 11/19/2017 PCP: Chevis Pretty, FNP   Brief Narrative:   This is a 72 year old female with history of COPD, anxiety, and hypertension who presented to the ED with worsening shortness of breath over the 2-3 days.  She was noted to be hypoxemic and was diagnosed with COPD exacerbation for which she has been placed on IV steroids as well as breathing treatments.  She is noted to be flu positive and continues to remain on 3 L nasal cannula.  She had a brief episode of tachycardia which appears to be SVT on EKG.  This resolved shortly after Cardizem IV infusion was started and she remains in sinus rhythm this morning off Cardizem IV drip.  Assessment & Plan:   Active Problems:   COPD exacerbation ([place])   Influenza  1. Acute COPD exacerbation secondary to influenza A.  Treatment with Tamiflu initiated this morning.  Continue low-dose IV steroid as well as breathing treatments with Xopenex as ordered.  Rocephin discontinued as she does not appear to have pneumonia.  Ambulate with assistance. 2. Acute hypoxemic respiratory failure secondary to above.  Wean oxygen as tolerated from 3 L nasal cannula to room air.  Notably, patient does not wear oxygen at home. 3. Tachyarrhythmia with SVT likely secondary to above factors.  This has resolved with Cardizem drip for short-term.  I will continue on oral Cardizem for now and monitor.  2D echocardiogram and TSH will be ordered for further evaluation.  She is currently in sinus rhythm. 4. Hypertension.  Cardizem ordered and will restart home lisinopril/HCTZ. 5. GERD.  Continue PPI. 6. Allergies.  Continue Flonase. 7. Bipolar disorder.  Restart Paxil. 8. Chronic back pain.  Restart home [place]. 9. Dyslipidemia.  Continue statin.    DVT prophylaxis:Lovenox Code Status: DNR Family Communication: None Disposition Plan: Home in 1-2 days pending improvement   Consultants:    None  Procedures:   None  Antimicrobials:   Rocephin 2/17->2/18   Subjective: Patient seen and evaluated today with no new acute complaints or concerns. No acute concerns or events noted overnight.  She has come off of her IV Cardizem drip and is currently in sinus rhythm.  Bronchospasms are minimally improved.  Objective: Vitals:   11/20/17 0830 11/20/17 0923 11/20/17 0929 11/20/17 1030  BP: (!) 164/85 (!) 148/76  (!) 178/81  Pulse:   77 78  Resp: 18  (!) 23 (!) 24  Temp:    98.3 F (36.8 C)  TempSrc:    Oral  SpO2:   93% 96%  Weight:    77.1 kg (170 lb)  Height:    5\' 5"  (1.651 m)    Intake/Output Summary (Last 24 hours) at 11/20/2017 1221 Last data filed at 11/19/2017 2350 Gross per 24 hour  Intake 1408.33 ml  Output 200 ml  Net 1208.33 ml   Filed Weights   11/19/17 1254 11/20/17 1030  Weight: 79.4 kg (175 lb) 77.1 kg (170 lb)    Examination:  General exam: Appears calm and comfortable  Respiratory system: Clear to auscultation. Respiratory effort normal. On 3L [place]; minimal wheezing noted. Cardiovascular system: S1 & S2 heard, RRR. No JVD, murmurs, rubs, gallops or clicks. No pedal edema. Gastrointestinal system: Abdomen is nondistended, soft and nontender. No organomegaly or masses felt. Normal bowel sounds heard. Central nervous system: Alert and oriented. No focal neurological deficits. Extremities: Symmetric 5 x 5 power. Skin: No rashes, lesions or ulcers Psychiatry: Judgement  and insight appear normal. Mood & affect appropriate.     Data Reviewed: I have personally reviewed following labs and imaging studies  CBC: Recent Labs  Lab 11/19/17 1315 11/20/17 0431  WBC 6.1 6.4  NEUTROABS 4.2  --   HGB 13.7 13.1  HCT 42.4 41.5  MCV 89.6 89.6  PLT 142* 944   Basic Metabolic Panel: Recent Labs  Lab 11/19/17 1315 11/20/17 0431  NA 136 138  K 3.2* 3.8  CL 95* 97*  CO2 30 30  GLUCOSE 102* 153*  BUN 8 6  CREATININE 0.62 0.48  CALCIUM 9.0 9.0   PHOS 3.8  --    GFR: Estimated Creatinine Clearance: 67.1 mL/min (by C-G formula based on SCr of 0.48 mg/dL). Liver Function Tests: No results for input(s): AST, ALT, ALKPHOS, BILITOT, PROT, ALBUMIN in the last 168 hours. No results for input(s): LIPASE, AMYLASE in the last 168 hours. No results for input(s): AMMONIA in the last 168 hours. Coagulation Profile: No results for input(s): INR, PROTIME in the last 168 hours. Cardiac Enzymes: Recent Labs  Lab 11/19/17 1315 11/19/17 2104  TROPONINI <0.03 <0.03   BNP (last 3 results) No results for input(s): PROBNP in the last 8760 hours. HbA1C: No results for input(s): HGBA1C in the last 72 hours. CBG: No results for input(s): GLUCAP in the last 168 hours. Lipid Profile: No results for input(s): CHOL, HDL, LDLCALC, TRIG, CHOLHDL, LDLDIRECT in the last 72 hours. Thyroid Function Tests: No results for input(s): TSH, T4TOTAL, FREET4, T3FREE, THYROIDAB in the last 72 hours. Anemia Panel: No results for input(s): VITAMINB12, FOLATE, FERRITIN, TIBC, IRON, RETICCTPCT in the last 72 hours. Sepsis Labs: Recent Labs  Lab 11/19/17 1320  LATICACIDVEN 0.9    No results found for this or any previous visit (from the past 240 hour(s)).       Radiology Studies: Dg Chest 2 View  Result Date: 11/19/2017 CLINICAL DATA:  Chest pain, shortness of breath cough EXAM: CHEST  2 VIEW COMPARISON:  07/28/2006 FINDINGS: Normal heart size and vascularity. Minor vascular and interstitial prominence, nonspecific. No focal pneumonia, collapse or consolidation. Negative for edema, effusion or pneumothorax. Trachea is midline. Aorta is atherosclerotic. Lower cervical fusion hardware noted. No acute osseous finding. IMPRESSION: Stable chest exam.  No acute process. Thoracic aortic atherosclerosis Electronically Signed   By: Jerilynn Mages.  Shick M.D.   On: 11/19/2017 14:08        Scheduled Meds: . diltiazem  30 mg Oral Q6H  . fluticasone  2 spray Each Nare Daily   . lisinopril-hydrochlorothiazide  1 tablet Oral Daily  . methylPREDNISolone (SOLU-MEDROL) injection  40 mg Intravenous Q12H  . oseltamivir  75 mg Oral BID  . pantoprazole  40 mg Oral BID  . simvastatin  40 mg Oral QHS   Continuous Infusions:   LOS: 0 days    Time spent: 30 minutes    Danis Pembleton Darleen Crocker, DO [facility] Pager [phone]  If 7PM-7AM, please contact night-coverage [email or web address] Password [email or web address] 11/20/2017, 12:21 PM

## 2017-11-21 ENCOUNTER — Inpatient Hospital Stay (HOSPITAL_COMMUNITY): Payer: Medicare Other

## 2017-11-21 DIAGNOSIS — I5030 Unspecified diastolic (congestive) heart failure: Secondary | ICD-10-CM

## 2017-11-21 LAB — BASIC METABOLIC PANEL
Anion gap: 9 (ref 5–15)
BUN: 13 mg/dL (ref 6–20)
CHLORIDE: 94 mmol/L — AB (ref 101–111)
CO2: 33 mmol/L — AB (ref 22–32)
CREATININE: 0.45 mg/dL (ref 0.44–1.00)
Calcium: 9.2 mg/dL (ref 8.9–10.3)
GFR calc Af Amer: >60 mL/min (ref >60)
GFR calc non Af Amer: >60 mL/min (ref >60)
Glucose, Bld: 136 mg/dL — ABNORMAL HIGH (ref 65–99)
Potassium: 4.4 mmol/L (ref 3.5–5.1)
Sodium: 136 mmol/L (ref 135–145)

## 2017-11-21 LAB — ECHOCARDIOGRAM COMPLETE
HEIGHTINCHES: 65 in
WEIGHTICAEL: 2827.2 [oz_av]

## 2017-11-21 LAB — CBC
HCT: 41 % (ref 36.0–46.0)
Hemoglobin: 12.6 g/dL (ref 12.0–15.0)
MCH: 28 pg (ref 26.0–34.0)
MCHC: 30.7 g/dL (ref 30.0–36.0)
MCV: 91.1 fL (ref 78.0–100.0)
PLATELETS: 169 10*3/uL (ref 150–400)
RBC: 4.5 MIL/uL (ref 3.87–5.11)
RDW: 14 % (ref 11.5–15.5)
WBC: 11.3 10*3/uL — ABNORMAL HIGH (ref 4.0–10.5)

## 2017-11-21 MED ORDER — PREDNISONE 20 MG PO TABS: 40.0000 mg | ORAL_TABLET | Freq: Every day | ORAL | 0 refills | Status: AC

## 2017-11-21 MED ORDER — PREDNISONE 20 MG PO TABS
40.0000 mg | ORAL_TABLET | Freq: Every day | ORAL | Status: DC
  Administered 2017-11-22: 40 mg via ORAL
  Filled 2017-11-21: qty 2

## 2017-11-21 MED ORDER — IPRATROPIUM BROMIDE 0.02 % IN SOLN: 0.5000 mg | Freq: Four times a day (QID) | RESPIRATORY_TRACT | 12 refills | Status: DC | PRN

## 2017-11-21 MED ORDER — SODIUM CHLORIDE 0.9 % IV SOLN
INTRAVENOUS | Status: DC
  Administered 2017-11-21: 15:00:00 via INTRAVENOUS

## 2017-11-21 MED ORDER — ATORVASTATIN CALCIUM 20 MG PO TABS
20.0000 mg | ORAL_TABLET | Freq: Every day | ORAL | Status: DC
  Administered 2017-11-21: 20 mg via ORAL
  Filled 2017-11-21: qty 1

## 2017-11-21 MED ORDER — COMPRESSOR/NEBULIZER MISC: 0 refills | Status: DC

## 2017-11-21 MED ORDER — LEVALBUTEROL HCL 1.25 MG/0.5ML IN NEBU: 1.2500 mg | INHALATION_SOLUTION | Freq: Four times a day (QID) | RESPIRATORY_TRACT | 12 refills | Status: DC | PRN

## 2017-11-21 MED ORDER — OSELTAMIVIR PHOSPHATE 75 MG PO CAPS: 75.0000 mg | ORAL_CAPSULE | Freq: Two times a day (BID) | ORAL | 0 refills | Status: AC

## 2017-11-21 MED ORDER — ENOXAPARIN SODIUM 40 MG/0.4ML ~~LOC~~ SOLN
40.0000 mg | SUBCUTANEOUS | Status: DC
  Administered 2017-11-21: 40 mg via SUBCUTANEOUS
  Filled 2017-11-21: qty 0.4

## 2017-11-21 MED ORDER — DICYCLOMINE HCL 10 MG PO CAPS
10.0000 mg | ORAL_CAPSULE | Freq: Four times a day (QID) | ORAL | Status: DC | PRN
  Administered 2017-11-21: 10 mg via ORAL
  Filled 2017-11-21: qty 1

## 2017-11-21 NOTE — Progress Notes (Signed)
*  PRELIMINARY RESULTS* Echocardiogram 2D Echocardiogram has been performed.  [name] 11/21/2017, 9:48 AM

## 2017-11-21 NOTE — Discharge Summary (Signed)
Physician Discharge Summary  [name] KWI:[id number] DOB: 10/13/1945 DOA: 11/19/2017  PCP: Chevis Pretty, FNP  Admit date: 11/19/2017  Discharge date: 11/21/2017  Admitted From:Home  Disposition:  Home  Recommendations for Outpatient Follow-up:  1. Follow up with PCP in 1-2 weeks  Home Health:N/A  Equipment/Devices:Home O2 via [place] 2L  Discharge Condition:Stable  CODE STATUS: Full  Diet recommendation: Heart Healthy  Brief/Interim Summary:  This is a 72 year old female with history of COPD, anxiety, and hypertension who presented to the ED with worsening shortness of breath over the 2-3 days.  She was noted to be hypoxemic and was diagnosed with COPD exacerbation for which she has been placed on IV steroids as well as breathing treatments.  She was noted to be flu A positive and continues to remain on 2 L nasal cannula.  She will be discharged on home oxygen. She had a brief episode of tachycardia which appears to be SVT on EKG.  This resolved shortly after Cardizem IV infusion and spontaneously converted to sinus rhythm. She did receive some oral Cardizem here, but does not appear to and require this on discharge. 2-D echocardiogram was performed demonstrating LV EF of 55-60% with grade 2 diastolic dysfunction and mild LVH. F/U with PCP in one week.   Discharge Diagnoses:  Active Problems:   COPD exacerbation ([name])   Influenza  1. Acute COPD exacerbation secondary to influenza A.  Treatment with Tamiflu initiated this admission which will need to be continued for 4 more days on discharge. Continue on prednisone for 5 more days as prescribed. Nebulizer machine prescribed with solution.  2. Acute hypoxemic respiratory failure secondary to above.   Patient does not wear oxygen at home but will require this on discharge at 2 L. This has been arranged. 3. Tachyarrhythmia with SVT likely secondary to above factors.   This has resolved and she is currently in sinus rhythm.  2-D echocardiogram with findings as noted above. She does not require Cardizem on discharge. 4. Hypertension. Continue medications as prescribed. 5. GERD.  Continue PPI. 6. Allergies.  Continue Flonase. 7. Bipolar disorder.  Continue Paxil. 8. Chronic back pain. Continue home [place]. 9. Dyslipidemia.  Continue statin.   Discharge Instructions  Discharge Instructions    Call MD for:  difficulty breathing, headache or visual disturbances   Complete by:  As directed    Call MD for:  extreme fatigue   Complete by:  As directed    Call MD for:  temperature >100.4   Complete by:  As directed    Diet - low sodium heart healthy   Complete by:  As directed    Increase activity slowly   Complete by:  As directed      Allergies as of 11/21/2017      Reactions   Meloxicam Other (See Comments)   Stomach ulcers      Medication List    TAKE these medications   ALPRAZolam 0.5 MG tablet Commonly known as:  XANAX Take 1 tablet (0.5 mg total) by mouth 3 (three) times daily as needed for anxiety.   aspirin-sod bicarb-citric acid 325 MG Tbef tablet Commonly known as:  ALKA-SELTZER Take 325 mg by mouth every 6 (six) hours as needed (cold/congestion).   calcium carbonate 600 MG Tabs tablet Commonly known as:  OS-CAL Take 600 mg by mouth daily.   Compressor/Nebulizer Misc Please use with nebulizer solution every 6 hours as needed for shortness of breath or wheezing.   dicyclomine 10 MG capsule  Commonly known as:  BENTYL Take 1 capsule (10 mg total) by mouth 4 (four) times daily as needed for spasms.   diphenhydrAMINE 25 MG tablet Commonly known as:  BENADRYL Take 25 mg by mouth 2 (two) times daily as needed. For Allergies   fluticasone 50 MCG/ACT nasal spray Commonly known as:  FLONASE Place 2 sprays into both nostrils daily.   GOODYS EXTRA STRENGTH 500-325-65 MG Pack Generic drug:  Aspirin-Acetaminophen-Caffeine Take 1 packet by mouth every 8 (eight) hours as needed (for  pain.).   HYDROcodone-acetaminophen 10-325 MG tablet Commonly known as:  NORCO Take 1 tablet by mouth 3 (three) times daily.   ipratropium 0.02 % nebulizer solution Commonly known as:  ATROVENT Take 2.5 mLs (0.5 mg total) by nebulization every 6 (six) hours as needed for wheezing or shortness of breath.   levalbuterol 1.25 MG/0.5ML nebulizer solution Commonly known as:  XOPENEX Take 1.25 mg by nebulization every 6 (six) hours as needed for wheezing or shortness of breath.   lisinopril-hydrochlorothiazide 10-12.5 MG tablet Commonly known as:  PRINZIDE,ZESTORETIC Take 1 tablet by mouth daily.   multivitamin with minerals Tabs tablet Take 1 tablet by mouth daily.   oseltamivir 75 MG capsule Commonly known as:  TAMIFLU Take 1 capsule (75 mg total) by mouth 2 (two) times daily for 4 days.   pantoprazole 40 MG tablet Commonly known as:  PROTONIX Take 1 tablet (40 mg total) by mouth 2 (two) times daily.   PARoxetine 20 MG tablet Commonly known as:  PAXIL Take 1 tablet (20 mg total) by mouth daily.   predniSONE 20 MG tablet Commonly known as:  DELTASONE Take 2 tablets (40 mg total) by mouth daily for 5 days.   PROAIR HFA 108 (90 Base) MCG/ACT inhaler Generic drug:  albuterol Inhale 2 puffs by mouth every 6 hours as needed for wheezing or shortness of breath   simvastatin 40 MG tablet Commonly known as:  ZOCOR Take 1 tablet (40 mg total) by mouth at bedtime.   vitamin C 500 MG tablet Commonly known as:  ASCORBIC ACID Take 1,000 mg by mouth daily.   Vitamin D3 1000 units Caps Take 3,000 mg by mouth daily.            Durable Medical Equipment  (From admission, onward)        Start     Ordered   11/21/17 1627  DME Oxygen  Once    Question Answer Comment  Mode or (Route) Nasal cannula   Frequency Continuous (stationary and portable oxygen unit needed)   Oxygen conserving device Yes   Oxygen delivery system Gas      11/21/17 1626     Follow-up Information     Hassell Done, Mary-Margaret, FNP Follow up in 1 week(s).   Specialty:  Family Medicine Contact information: [address] [place] [address] [phone]          Allergies  Allergen Reactions  . Meloxicam Other (See Comments)    Stomach ulcers    Consultations:  None   Procedures/Studies: Dg Chest 2 View  Result Date: 11/19/2017 CLINICAL DATA:  Chest pain, shortness of breath cough EXAM: CHEST  2 VIEW COMPARISON:  07/28/2006 FINDINGS: Normal heart size and vascularity. Minor vascular and interstitial prominence, nonspecific. No focal pneumonia, collapse or consolidation. Negative for edema, effusion or pneumothorax. Trachea is midline. Aorta is atherosclerotic. Lower cervical fusion hardware noted. No acute osseous finding. IMPRESSION: Stable chest exam.  No acute process. Thoracic aortic atherosclerosis Electronically Signed  By: Eugenie Filler M.D.   On: 11/19/2017 14:08      Discharge Exam: Vitals:   11/21/17 1500 11/21/17 1504  BP:    Pulse:    Resp:    Temp:    SpO2: (!) 86% 94%   Vitals:   11/21/17 1447 11/21/17 1455 11/21/17 1500 11/21/17 1504  BP: (!) 153/71     Pulse: 69     Resp: 16     Temp: 98.8 F (37.1 C)     TempSrc: Oral     SpO2: 96% (!) 78% (!) 86% 94%  Weight:      Height:        General: Pt is alert, awake, not in acute distress Cardiovascular: RRR, S1/S2 +, no rubs, no gallops Respiratory: CTA bilaterally, no wheezing, no rhonchi; on 2L [place] Abdominal: Soft, NT, ND, bowel sounds + Extremities: no edema, no cyanosis    The results of significant diagnostics from this hospitalization (including imaging, microbiology, ancillary and laboratory) are listed below for reference.     Microbiology: No results found for this or any previous visit (from the past 240 hour(s)).   Labs: BNP (last 3 results) No results for input(s): BNP in the last 8760 hours. Basic Metabolic Panel: Recent Labs  Lab 11/19/17 1315 11/20/17 0431  11/21/17 0558  NA 136 138 136  K 3.2* 3.8 4.4  CL 95* 97* 94*  CO2 30 30 33*  GLUCOSE 102* 153* 136*  BUN 8 6 13   CREATININE 0.62 0.48 0.45  CALCIUM 9.0 9.0 9.2  PHOS 3.8  --   --    Liver Function Tests: No results for input(s): AST, ALT, ALKPHOS, BILITOT, PROT, ALBUMIN in the last 168 hours. No results for input(s): LIPASE, AMYLASE in the last 168 hours. No results for input(s): AMMONIA in the last 168 hours. CBC: Recent Labs  Lab 11/19/17 1315 11/20/17 0431 11/21/17 0558  WBC 6.1 6.4 11.3*  NEUTROABS 4.2  --   --   HGB 13.7 13.1 12.6  HCT 42.4 41.5 41.0  MCV 89.6 89.6 91.1  PLT 142* 156 169   Cardiac Enzymes: Recent Labs  Lab 11/19/17 1315 11/19/17 2104  TROPONINI <0.03 <0.03   BNP: Invalid input(s): POCBNP CBG: No results for input(s): GLUCAP in the last 168 hours. D-Dimer No results for input(s): DDIMER in the last 72 hours. Hgb A1c No results for input(s): HGBA1C in the last 72 hours. Lipid Profile No results for input(s): CHOL, HDL, LDLCALC, TRIG, CHOLHDL, LDLDIRECT in the last 72 hours. Thyroid function studies Recent Labs    11/19/17 1315  TSH 2.706   Anemia work up No results for input(s): VITAMINB12, FOLATE, FERRITIN, TIBC, IRON, RETICCTPCT in the last 72 hours. Urinalysis    Component Value Date/Time   COLORURINE YELLOW 11/26/2012 1211   APPEARANCEUR Clear 07/28/2016 1542   LABSPEC <1.005 (L) 11/26/2012 1211   PHURINE 5.5 11/26/2012 1211   GLUCOSEU Negative 07/28/2016 1542   HGBUR NEGATIVE 11/26/2012 1211   BILIRUBINUR Negative 07/28/2016 1542   KETONESUR NEGATIVE 11/26/2012 1211   PROTEINUR Negative 07/28/2016 1542   PROTEINUR NEGATIVE 11/26/2012 1211   UROBILINOGEN negative 08/09/2013 1050   UROBILINOGEN 0.2 11/26/2012 1211   NITRITE Negative 07/28/2016 1542   NITRITE POSITIVE (A) 11/26/2012 1211   LEUKOCYTESUR Negative 07/28/2016 1542   Sepsis Labs Invalid input(s): PROCALCITONIN,  WBC,  LACTICIDVEN Microbiology No results  found for this or any previous visit (from the past 240 hour(s)).   Time coordinating discharge: Over 30  minutes  SIGNED:   Rodena Goldmann, DO [facility] 11/21/2017, 4:27 PM Pager [phone]  If 7PM-7AM, please contact night-coverage [email or web address] Password [email or web address]

## 2017-11-21 NOTE — Care Management Note (Addendum)
Case Management Note  Patient Details  Name: [name] MRN: [id number] Date of Birth: 21-Aug-1946  Subjective/Objective:          Admitted with COPD exacerbation. Pt is from home, ind, has PCP, transportation and insurance with drug coverage. Pt will need home oxygen and neb machine at DC. Pt requests DME come from [facility] as she has used them in the past.           Action/Plan: DC home in next 24 hrs to home O2 and neb machine. Blake Divine, [facility] rep, aware of referral and will pull pt info from chart. [place] O2 tank will be delivered to pt room prior to DC. Referred for Emmi transition calls.   Expected Discharge Date:    11/22/2017              Expected Discharge Plan:  Home/Self Care  In-House Referral:  NA  Discharge planning Services  CM Consult  Post Acute Care Choice:  Durable Medical Equipment Choice offered to:  Patient  DME Arranged:  Oxygen, Nebulizer machine DME Agency:  [place]:    [facility] Agency:     Status of Service:  Completed, signed off  If discussed at [place] of Stay Meetings, dates discussed:    Additional Comments:  Sherald Barge, RN 11/21/2017, 3:54 PM

## 2017-11-21 NOTE — Progress Notes (Signed)
PROGRESS NOTE    [name]  XKG:[id number] DOB: 09/19/1946 DOA: 11/19/2017 PCP: Chevis Pretty, FNP   Brief Narrative:   This is a 72 year old female with history of COPD, anxiety, and hypertension who presented to the ED with worsening shortness of breath over the 2-3 days.  She was noted to be hypoxemic and was diagnosed with COPD exacerbation for which she has been placed on IV steroids as well as breathing treatments.  She is noted to be flu positive and continues to remain on 3 L nasal cannula.  She had a brief episode of tachycardia which appears to be SVT on EKG.  This resolved shortly after Cardizem IV infusion was started and she remains in sinus rhythm this morning off Cardizem IV drip. 2D echo with preserved EF and grade 2 diastolic dysfunction noted.  Assessment & Plan:   Active Problems:   COPD exacerbation ([place])   Influenza  1. Acute COPD exacerbation secondary to influenza A.  This has resolved and patient can now be on oral prednisone.  2. Acute hypoxemic respiratory failure secondary to above.  Patient requires home O2 which has been arranged, but cannot be delivered until tomorrow. 3. Tachyarrhythmia with SVT likely secondary to above factors.  This has resolved with Cardizem drip for short-term. Echo WNL; no need for further Cardizem. 4. Hypertension.  Restart home lisinopril/HCTZ. 5. GERD.  Continue PPI. 6. Allergies.  Continue Flonase. 7. Bipolar disorder.  Restart Paxil. 8. Chronic back pain.  Restart home [place]. 9. Dyslipidemia.  Continue statin.    DVT prophylaxis:Lovenox Code Status: DNR Family Communication: Spoke with daughter on phone Disposition Plan: Home in am with home O2 that will be delivered tomorrow.   Consultants:   None  Procedures:   None  Antimicrobials:   Rocephin 2/17->2/18   Subjective: Patient seen and evaluated today with no new acute complaints or concerns. No acute concerns or events noted overnight.  She  is overall much improved and appears ready to go home. Unfortunately, she does need home oxygen and cannot receive it until tomorrow.  Objective: Vitals:   11/21/17 1447 11/21/17 1455 11/21/17 1500 11/21/17 1504  BP: (!) 153/71     Pulse: 69     Resp: 16     Temp: 98.8 F (37.1 C)     TempSrc: Oral     SpO2: 96% (!) 78% (!) 86% 94%  Weight:      Height:        Intake/Output Summary (Last 24 hours) at 11/21/2017 1805 Last data filed at 11/20/2017 2345 Gross per 24 hour  Intake 720 ml  Output -  Net 720 ml   Filed Weights   11/19/17 1254 11/20/17 1030 11/21/17 0452  Weight: 79.4 kg (175 lb) 77.1 kg (170 lb) 80.2 kg (176 lb 11.2 oz)    Examination:  General exam: Appears calm and comfortable  Respiratory system: Clear to auscultation. Respiratory effort normal. On 2L [place]; minimal wheezing noted. Cardiovascular system: S1 & S2 heard, RRR. No JVD, murmurs, rubs, gallops or clicks. No pedal edema. Gastrointestinal system: Abdomen is nondistended, soft and nontender. No organomegaly or masses felt. Normal bowel sounds heard. Central nervous system: Alert and oriented. No focal neurological deficits. Extremities: Symmetric 5 x 5 power. Skin: No rashes, lesions or ulcers Psychiatry: Judgement and insight appear normal. Mood & affect appropriate.     Data Reviewed: I have personally reviewed following labs and imaging studies  CBC: Recent Labs  Lab 11/19/17 1315 11/20/17 0431 11/21/17  0558  WBC 6.1 6.4 11.3*  NEUTROABS 4.2  --   --   HGB 13.7 13.1 12.6  HCT 42.4 41.5 41.0  MCV 89.6 89.6 91.1  PLT 142* 156 983   Basic Metabolic Panel: Recent Labs  Lab 11/19/17 1315 11/20/17 0431 11/21/17 0558  NA 136 138 136  K 3.2* 3.8 4.4  CL 95* 97* 94*  CO2 30 30 33*  GLUCOSE 102* 153* 136*  BUN 8 6 13   CREATININE 0.62 0.48 0.45  CALCIUM 9.0 9.0 9.2  PHOS 3.8  --   --    GFR: Estimated Creatinine Clearance: 68.5 mL/min (by C-G formula based on SCr of 0.45 mg/dL). Liver  Function Tests: No results for input(s): AST, ALT, ALKPHOS, BILITOT, PROT, ALBUMIN in the last 168 hours. No results for input(s): LIPASE, AMYLASE in the last 168 hours. No results for input(s): AMMONIA in the last 168 hours. Coagulation Profile: No results for input(s): INR, PROTIME in the last 168 hours. Cardiac Enzymes: Recent Labs  Lab 11/19/17 1315 11/19/17 2104  TROPONINI <0.03 <0.03   BNP (last 3 results) No results for input(s): PROBNP in the last 8760 hours. HbA1C: No results for input(s): HGBA1C in the last 72 hours. CBG: No results for input(s): GLUCAP in the last 168 hours. Lipid Profile: No results for input(s): CHOL, HDL, LDLCALC, TRIG, CHOLHDL, LDLDIRECT in the last 72 hours. Thyroid Function Tests: Recent Labs    11/19/17 1315  TSH 2.706   Anemia Panel: No results for input(s): VITAMINB12, FOLATE, FERRITIN, TIBC, IRON, RETICCTPCT in the last 72 hours. Sepsis Labs: Recent Labs  Lab 11/19/17 1320  LATICACIDVEN 0.9    No results found for this or any previous visit (from the past 240 hour(s)).       Radiology Studies: No results found.      Scheduled Meds: . atorvastatin  20 mg Oral q1800  . diltiazem  30 mg Oral Q6H  . enoxaparin (LOVENOX) injection  40 mg Subcutaneous Q24H  . fluticasone  2 spray Each Nare Daily  . lisinopril  10 mg Oral Daily   And  . hydrochlorothiazide  12.5 mg Oral Daily  . HYDROcodone-acetaminophen  1 tablet Oral TID  . mouth rinse  15 mL Mouth Rinse BID  . methylPREDNISolone (SOLU-MEDROL) injection  40 mg Intravenous Q12H  . oseltamivir  75 mg Oral BID  . pantoprazole  40 mg Oral BID  . PARoxetine  20 mg Oral Daily   Continuous Infusions: . sodium chloride 75 mL/hr at 11/21/17 1500     LOS: 1 day    Time spent: 30 minutes    Pratik Darleen Crocker, DO [facility] Pager [phone]  If 7PM-7AM, please contact night-coverage [email or web address] Password [facility] 11/21/2017, 6:05 PM

## 2017-11-21 NOTE — Progress Notes (Signed)
SATURATION QUALIFICATIONS: (This note is used to comply with regulatory documentation for home oxygen)  Patient Saturations on Room Air at Rest = 86%   Patient Saturations on 2 Liters of oxygen while at rest = 94%  Please briefly explain why patient needs home oxygen: pt desats while on room air

## 2017-11-22 DIAGNOSIS — J111 Influenza due to unidentified influenza virus with other respiratory manifestations: Secondary | ICD-10-CM

## 2017-11-22 NOTE — Progress Notes (Signed)
Patient seen and examined. Hemodynamically stable for discharge. Discharge medication is reviewed and appropriate. Patient instructed to follow up with PCP in 10 days. Please refer to discharge summary done by Dr. Manuella Ghazi on 2/19 for further info/details.  Barton Dubois MD [phone]

## 2017-11-22 NOTE — Care Management Important Message (Signed)
Important Message  Patient Details  Name: [name] MRN: [id number] Date of Birth: [birth year]   Medicare Important Message Given:  Yes    Sherald Barge, RN 11/22/2017, 9:56 AM

## 2017-11-22 NOTE — Care Management (Signed)
Home O2 has been arranged. Will be delivered this AM. DME was ready for delivery on 2/19, per pt she had been told she would not be discharging home until 2/20.

## 2017-11-23 DIAGNOSIS — J441 Chronic obstructive pulmonary disease with (acute) exacerbation: Secondary | ICD-10-CM | POA: Diagnosis not present

## 2017-11-24 ENCOUNTER — Other Ambulatory Visit: Payer: Self-pay

## 2017-11-24 NOTE — Patient Outreach (Signed)
[facility]) Care Management  11/24/2017  [name] [facility] [birth year] [id number]   EMMI- COPD RED ON EMMI ALERT Day # 1 Date: 11/23/17 Red Alert Reason: # of times rescue inhaler used in the past 24 hours? 5  Telephone call patient for EMMI follow up. Patient able to verify HIPAA.  Discussed with patient automated phone calls and the one she received on yesterday.  Patient reports that she was in no mood to talk on yesterday.  Patient states that she feels better today.  Addressed the red alert reason with patient and she states that she did not answer 5. She states she only uses her inhaler about twice a day.  Patient states that just the combination of her having the flu and COPD together has been hard for her but states she is feeling better and feels she will be even better after the weekend.  Asked patient about her follow up with her doctor.  She says she has not but states she will.  She says they told her in 1 week.  I advised her to call today to try to get in next week and reiterated the importance of her follow up appointment. She verbalized understanding.  Discussed with patient [place].  Patient declined services at this time.  Patient agreeable to receive letter and brochure.  Patient given CM contact information and 24 hour nurse line.    Plan: RN CM will send letter and brochure. RN CM will close case and notify care management assistant of case status.  Jone Baseman, RN, MSN [facility] Care Management Care Management Coordinator Direct Line [phone] Toll Free: [phone]  Fax: [phone]

## 2017-11-30 ENCOUNTER — Ambulatory Visit (INDEPENDENT_AMBULATORY_CARE_PROVIDER_SITE_OTHER): Payer: Medicare Other | Admitting: Nurse Practitioner

## 2017-11-30 ENCOUNTER — Encounter: Payer: Self-pay | Admitting: Nurse Practitioner

## 2017-11-30 VITALS — BP 117/71 | HR 89 | Temp 98.9°F | Ht 65.0 in | Wt 170.0 lb

## 2017-11-30 DIAGNOSIS — Z09 Encounter for follow-up examination after completed treatment for conditions other than malignant neoplasm: Secondary | ICD-10-CM | POA: Diagnosis not present

## 2017-11-30 DIAGNOSIS — J449 Chronic obstructive pulmonary disease, unspecified: Secondary | ICD-10-CM

## 2017-11-30 DIAGNOSIS — F17200 Nicotine dependence, unspecified, uncomplicated: Secondary | ICD-10-CM | POA: Diagnosis not present

## 2017-11-30 NOTE — Patient Instructions (Signed)
Home Oxygen Use, Adult    When a medical condition keeps you from getting enough oxygen, your health care provider may instruct you to take extra oxygen at home. Your health care provider will let you know:   When to take oxygen.   For how long to take oxygen.   How quickly oxygen should be delivered (flow rate), in liters per minute (LPM or L/M).    Home oxygen can be given through:   A mask.   A nasal cannula. This is a device or tube that goes in the nostrils.   A transtracheal catheter. This is a small, flexible tube placed in the trachea.   A tracheostomy. This is a surgically made opening in the trachea.    These devices are connected with tubing to an oxygen source, such as:   A tank. Tanks hold oxygen in gas form. They must be replaced when the oxygen is used up.   A liquid oxygen device. This holds oxygen in liquid form. It must be replaced when the oxygen is used up.   An oxygen concentrator machine. This filters oxygen in the room. It uses electricity, so you must have a backup cylinder of oxygen in case the power goes out.    Supplies needed:  To use oxygen, you will need:   A mask, nasal cannula, transtracheal catheter, or tracheostomy.   An oxygen tank, a liquid oxygen device, or an oxygen concentrator.   The tape that your health care provider recommends (optional).    If you use a transtracheal catheter and your prescribed flow rate is 1 LPM or greater, you will also need a humidifier.  Risks and complications   Fire. This can happen if the oxygen is exposed to a heat source, flame, or spark.   Injury to skin. This can happen if liquid oxygen touches your skin.   Organ damage. This can happen if you get too little oxygen.  How to use oxygen    Your health care provider will show you how to use your oxygen device. Follow her or his instructions. They may look something like this:  1. Wash your hands.  2. If you use an oxygen concentrator, make sure it is plugged in.  3. Place one  end of the tube into the port on the tank, device, or machine.  4. Place the mask over your nose and mouth. Or, place the nasal cannula and secure it with tape if instructed. If you use a tracheostomy or transtracheal catheter, connect it to the oxygen source as directed.  5. Make sure the liter-flow setting on the machine is at the level prescribed by your health care provider.  6. Turn on the machine or adjust the knob on the tank or device to the correct liter-flow setting.  7. When you are done, turn off and unplug the machine, or turn the knob to OFF.    How to clean and care for the oxygen supplies  Nasal cannula   Clean it with a warm, wet cloth daily or as needed.   Wash it with a liquid soap once a week.   Rinse it thoroughly once or twice a week.   Replace it every 2-4 weeks.   If you have an infection, such as a cold or pneumonia, change the cannula when you get better.  Mask   Replace it every 2-4 weeks.   If you have an infection, such as a cold or pneumonia, change the mask when   you get better.  Humidifier bottle   Wash the bottle between each refill:  ? Wash it with soap and warm water.  ? Rinse it thoroughly.  ? Disinfect it and its top.  ? Air-dry it.   Make sure it is dry before you refill it.  Oxygen concentrator   Clean the air filter at least twice a week according to directions from your home medical equipment and service company.   Wipe down the cabinet every day. To do this:  ? Unplug the unit.  ? Wipe down the cabinet with a damp cloth.  ? Dry the cabinet.  Other equipment   Change any extra tubing every 1-3 months.   Follow instructions from your health care provider about taking care of any other equipment.  Safety tips  Fire safety tips       Keep your oxygen and oxygen supplies at least 5 ft away from sources of heat, flames, and sparks at all times.   Do not allow smoking near your oxygen. Put up "no smoking" signs in your home.   Do not use materials that can burn  (are flammable) while you use oxygen.   When you go to a restaurant with portable oxygen, ask to be seated in the nonsmoking section.   Keep a fire extinguisher close by. Let your fire department know that you have oxygen in your home.   Test your home smoke detectors regularly.  General safety tips   If you use an oxygen cylinder, make sure it is in a stand or secured to an object that will not move (fixed object).   If you use liquid oxygen, make sure its container is kept upright.   If you use an oxygen concentrator:  ? Tell your electric company. Make sure you are given priority service in the event that your power goes out.  ? Avoid using extension cords, if possible.  Follow these instructions at home:   Use oxygen only as told by your health care provider.   Do not use alcohol or other drugs that make you relax (sedating drugs) unless instructed. They can slow down your breathing rate and make it hard to get in enough oxygen.   Know how and when to order a refill of oxygen.   Always keep a spare tank of oxygen. Plan ahead for holidays when you may not be able to get a prescription filled.   Use water-based lubricants on your lips or nostrils. Do not use oil-based products like petroleum jelly.   To prevent skin irritation on your cheeks or behind your ears, tuck some gauze under the tubing.  Contact a health care provider if:   You get headaches often.   You have shortness of breath.   You have a lasting cough.   You have anxiety.   You are sleepy all the time.   You develop an illness that affects your breathing.   You cannot exercise at your regular level.   You are restless.   You have difficult or irregular breathing, and it is getting worse.   You have a fever.   You have persistent redness under your nose.  Get help right away if:   You are confused.   You have blue lips or fingernails.   You are struggling to breathe.  This information is not intended to replace advice given  to you by your health care provider. Make sure you discuss any questions you have with your health   care provider.  Document Released: 12/10/2003 Document Revised: 05/18/2016 Document Reviewed: 04/12/2016  Elsevier Interactive Patient Education  2018 Elsevier Inc[employer]

## 2017-11-30 NOTE — Progress Notes (Signed)
   Subjective:    Patient ID: [name], female    DOB: 09-Jan-1946, 72 y.o.   MRN: [id number]  HPI patient comes in today for hospital follow up. She went to ED at [facility] ([place]) with COPD exacerbation. She was given IV steroids and breathing treatments which did not improve her breathing. So she was admitted to the hospital. Hr COPD was complicated by positive flu test. While in ER she had a short run of SVT which responded to cardizem. Upon discharge she was dong much better. She was ordered oxygen at home. Since discharge she as ben very weak and fatigued which is understandable. She only uses her oxygen when needed. She has a pulse ox at home and o2 sat has been running around 90-92. Is drops below 90 she puts it on. She is still smoking   Review of Systems  Constitutional: Positive for fatigue. Negative for chills and fever.  HENT: Positive for congestion.   Respiratory: Positive for cough and shortness of breath.   Cardiovascular: Negative for chest pain, palpitations and leg swelling.  Genitourinary: Negative.   Musculoskeletal: Negative.   Neurological: Negative.   Psychiatric/Behavioral: Negative.   All other systems reviewed and are negative.      Objective:   Physical Exam  Constitutional: She appears well-developed and well-nourished. She appears distressed (mild).  HENT:  Right Ear: External ear normal.  Left Ear: External ear normal.  Nose: Nose normal.  Mouth/Throat: Oropharynx is clear and moist.  Eyes: Pupils are equal, round, and reactive to light.  Neck: Normal range of motion. Neck supple.  Cardiovascular: Normal rate and regular rhythm.  Pulmonary/Chest: She is in respiratory distress (breathing is slightly labored). She has wheezes (course exp wheezes throughout).  Abdominal: Soft. Bowel sounds are normal.  Neurological: She is alert.  Skin: Skin is warm.  Psychiatric: She has a normal mood and affect. Her behavior is normal. Judgment and thought content  normal.   BP 117/71   Pulse 89   Temp 98.9 F (37.2 C) (Oral)   Ht 5\' 5"  (1.651 m)   Wt 170 lb (77.1 kg)   SpO2 92%   BMI 28.29 kg/m       Assessment & Plan:   1. COPD (chronic obstructive pulmonary disease) with chronic bronchitis ([place])   2. Smoker   3. Hospital discharge follow-up    Stop smoking Wear oxygen at least at night nebulizers as rx mucinex OTC  Follow up in 2 weeks  [place], FNP

## 2017-12-14 ENCOUNTER — Encounter: Payer: Self-pay | Admitting: Nurse Practitioner

## 2017-12-14 ENCOUNTER — Ambulatory Visit (INDEPENDENT_AMBULATORY_CARE_PROVIDER_SITE_OTHER): Payer: Medicare Other | Admitting: Nurse Practitioner

## 2017-12-14 VITALS — BP 86/65 | HR 73 | Temp 98.0°F | Ht 65.0 in | Wt 172.0 lb

## 2017-12-14 DIAGNOSIS — R300 Dysuria: Secondary | ICD-10-CM | POA: Diagnosis not present

## 2017-12-14 DIAGNOSIS — N3090 Cystitis, unspecified without hematuria: Secondary | ICD-10-CM

## 2017-12-14 LAB — MICROSCOPIC EXAMINATION: RENAL EPITHEL UA: NONE SEEN /HPF

## 2017-12-14 LAB — URINALYSIS, COMPLETE
BILIRUBIN UA: NEGATIVE
Glucose, UA: NEGATIVE
Ketones, UA: NEGATIVE
Nitrite, UA: NEGATIVE
PH UA: 5 (ref 5.0–7.5)
Protein, UA: NEGATIVE
RBC UA: NEGATIVE
Specific Gravity, UA: 1.02 (ref 1.005–1.030)
Urobilinogen, Ur: 0.2 mg/dL (ref 0.2–1.0)

## 2017-12-14 MED ORDER — CIPROFLOXACIN HCL 500 MG PO TABS: 500.0000 mg | ORAL_TABLET | Freq: Two times a day (BID) | ORAL | 0 refills | Status: DC

## 2017-12-14 NOTE — Patient Instructions (Signed)

## 2017-12-14 NOTE — Progress Notes (Signed)
   Subjective:    Patient ID: [name], female    DOB: 06-13-1946, 72 y.o.   MRN: [id number]  HPI Patient was seen on 11/30/17 with COPD exacerbation. She had been to ER at [facility] several days prior toi that visit and was given iv steroids and breathing treatments. She is suppose to be on oxygen at home but does not use as she should. She does use it at night but not during the day much. She is better with breathing. Only has slight cough. Still smoking. Her main complaint to day is dysuria .     Review of Systems  Constitutional: Negative for chills and fever.  HENT: Negative.   Respiratory: Positive for cough (slight) and shortness of breath (better then last visit).   Cardiovascular: Negative.   Genitourinary: Positive for dysuria, frequency, pelvic pain and urgency.  Neurological: Negative.   Psychiatric/Behavioral: Negative.   All other systems reviewed and are negative.      Objective:   Physical Exam  Constitutional: She is oriented to person, place, and time. She appears well-developed and well-nourished. No distress.  Cardiovascular: Normal rate and regular rhythm.  Pulmonary/Chest: Effort normal. She has wheezes (exp wheeze).  Neurological: She is alert and oriented to person, place, and time.  Skin: Skin is warm.  Psychiatric: She has a normal mood and affect. Her behavior is normal. Judgment and thought content normal.    BP (!) 86/65   Pulse 73   Temp 98 F (36.7 C) (Oral)   Ht 5\' 5"  (1.651 m)   Wt 172 lb (78 kg)   BMI 28.62 kg/m   UA-  (+) leuks  11-30WBC     Assessment & Plan:   1. Dysuria   2. Cystitis    Meds ordered this encounter  Medications  . ciprofloxacin (CIPRO) 500 MG tablet    Sig: Take 1 tablet (500 mg total) by mouth 2 (two) times daily.    Dispense:  10 tablet    Refill:  0    Order Specific Question:   Supervising Provider    Answer:   Evette Doffing, CAROL L [[id number]]   Take medication as prescribe Cotton underwear Take  shower not bath Cranberry juice, yogurt Force fluids AZO over the counter X2 days Culture pending RTO prn  [name], FNP

## 2017-12-16 LAB — URINE CULTURE

## 2017-12-21 DIAGNOSIS — J441 Chronic obstructive pulmonary disease with (acute) exacerbation: Secondary | ICD-10-CM | POA: Diagnosis not present

## 2018-01-12 ENCOUNTER — Ambulatory Visit (INDEPENDENT_AMBULATORY_CARE_PROVIDER_SITE_OTHER): Payer: Medicare Other | Admitting: Nurse Practitioner

## 2018-01-12 ENCOUNTER — Encounter: Payer: Self-pay | Admitting: Nurse Practitioner

## 2018-01-12 VITALS — BP 147/83 | HR 79 | Temp 97.9°F | Ht 65.0 in | Wt 172.0 lb

## 2018-01-12 DIAGNOSIS — F419 Anxiety disorder, unspecified: Secondary | ICD-10-CM

## 2018-01-12 DIAGNOSIS — G8929 Other chronic pain: Secondary | ICD-10-CM

## 2018-01-12 DIAGNOSIS — F3342 Major depressive disorder, recurrent, in full remission: Secondary | ICD-10-CM | POA: Diagnosis not present

## 2018-01-12 DIAGNOSIS — Z6832 Body mass index (BMI) 32.0-32.9, adult: Secondary | ICD-10-CM

## 2018-01-12 DIAGNOSIS — J449 Chronic obstructive pulmonary disease, unspecified: Secondary | ICD-10-CM

## 2018-01-12 DIAGNOSIS — I10 Essential (primary) hypertension: Secondary | ICD-10-CM | POA: Diagnosis not present

## 2018-01-12 DIAGNOSIS — I700 Atherosclerosis of aorta: Secondary | ICD-10-CM

## 2018-01-12 DIAGNOSIS — J209 Acute bronchitis, unspecified: Secondary | ICD-10-CM

## 2018-01-12 DIAGNOSIS — F17200 Nicotine dependence, unspecified, uncomplicated: Secondary | ICD-10-CM

## 2018-01-12 DIAGNOSIS — E785 Hyperlipidemia, unspecified: Secondary | ICD-10-CM

## 2018-01-12 DIAGNOSIS — M545 Low back pain: Secondary | ICD-10-CM

## 2018-01-12 DIAGNOSIS — K219 Gastro-esophageal reflux disease without esophagitis: Secondary | ICD-10-CM | POA: Diagnosis not present

## 2018-01-12 DIAGNOSIS — K529 Noninfective gastroenteritis and colitis, unspecified: Secondary | ICD-10-CM

## 2018-01-12 DIAGNOSIS — E876 Hypokalemia: Secondary | ICD-10-CM

## 2018-01-12 MED ORDER — SIMVASTATIN 40 MG PO TABS
40.0000 mg | ORAL_TABLET | Freq: Every day | ORAL | 1 refills | Status: DC
Start: 2018-01-12 — End: 2018-07-12

## 2018-01-12 MED ORDER — PAROXETINE HCL 20 MG PO TABS: 20.0000 mg | ORAL_TABLET | Freq: Every day | ORAL | 1 refills | Status: DC

## 2018-01-12 MED ORDER — ALPRAZOLAM 0.5 MG PO TABS: 0.5000 mg | ORAL_TABLET | Freq: Three times a day (TID) | ORAL | 2 refills | Status: DC | PRN

## 2018-01-12 MED ORDER — HYDROCODONE-ACETAMINOPHEN 10-325 MG PO TABS: 1.0000 | ORAL_TABLET | Freq: Three times a day (TID) | ORAL | 0 refills | Status: DC | PRN

## 2018-01-12 MED ORDER — LEVALBUTEROL HCL 1.25 MG/0.5ML IN NEBU: 1.2500 mg | INHALATION_SOLUTION | Freq: Four times a day (QID) | RESPIRATORY_TRACT | 12 refills | Status: DC | PRN

## 2018-01-12 MED ORDER — PANTOPRAZOLE SODIUM 40 MG PO TBEC
40.0000 mg | DELAYED_RELEASE_TABLET | Freq: Two times a day (BID) | ORAL | 1 refills | Status: DC
Start: 2018-01-12 — End: 2018-07-12

## 2018-01-12 MED ORDER — HYDROCODONE-ACETAMINOPHEN 10-325 MG PO TABS: 1.0000 | ORAL_TABLET | Freq: Three times a day (TID) | ORAL | 0 refills | Status: DC

## 2018-01-12 MED ORDER — LISINOPRIL-HYDROCHLOROTHIAZIDE 10-12.5 MG PO TABS: 1.0000 | ORAL_TABLET | Freq: Every day | ORAL | 1 refills | Status: DC

## 2018-01-12 MED ORDER — DICYCLOMINE HCL 10 MG PO CAPS: 10.0000 mg | ORAL_CAPSULE | Freq: Four times a day (QID) | ORAL | 5 refills | Status: DC | PRN

## 2018-01-12 MED ORDER — ALBUTEROL SULFATE HFA 108 (90 BASE) MCG/ACT IN AERS
INHALATION_SPRAY | RESPIRATORY_TRACT | 5 refills | Status: DC
Start: 2018-01-12 — End: 2018-09-06

## 2018-01-12 NOTE — Patient Instructions (Signed)

## 2018-01-12 NOTE — Progress Notes (Signed)
Subjective:    Patient ID: [name], female    DOB: [birth year], 72 y.o.   MRN: [id number]  HPI  [name] is here today for follow up of chronic medical problem.  Outpatient Encounter Medications as of 01/12/2018  Medication Sig  . ALPRAZolam (XANAX) 0.5 MG tablet Take 1 tablet (0.5 mg total) by mouth 3 (three) times daily as needed for anxiety.  . Aspirin-Acetaminophen-Caffeine (GOODYS EXTRA STRENGTH) 500-325-65 MG PACK Take 1 packet by mouth every 8 (eight) hours as needed (for pain.).  Marland Kitchen aspirin-sod bicarb-citric acid (ALKA-SELTZER) 325 MG TBEF tablet Take 325 mg by mouth every 6 (six) hours as needed (cold/congestion).  . calcium carbonate (OS-CAL) 600 MG TABS Take 600 mg by mouth daily.   . Cholecalciferol (VITAMIN D3) 1000 UNITS CAPS Take 3,000 mg by mouth daily.   . ciprofloxacin (CIPRO) 500 MG tablet Take 1 tablet (500 mg total) by mouth 2 (two) times daily.  Marland Kitchen dicyclomine (BENTYL) 10 MG capsule Take 1 capsule (10 mg total) by mouth 4 (four) times daily as needed for spasms.  . diphenhydrAMINE (BENADRYL) 25 MG tablet Take 25 mg by mouth 2 (two) times daily as needed. For Allergies  . fluticasone (FLONASE) 50 MCG/ACT nasal spray Place 2 sprays into both nostrils daily.  Marland Kitchen HYDROcodone-acetaminophen (NORCO) 10-325 MG tablet Take 1 tablet by mouth 3 (three) times daily.  Marland Kitchen ipratropium (ATROVENT) 0.02 % nebulizer solution Take 2.5 mLs (0.5 mg total) by nebulization every 6 (six) hours as needed for wheezing or shortness of breath.  . levalbuterol (XOPENEX) 1.25 MG/0.5ML nebulizer solution Take 1.25 mg by nebulization every 6 (six) hours as needed for wheezing or shortness of breath.  . lisinopril-hydrochlorothiazide (PRINZIDE,ZESTORETIC) 10-12.5 MG tablet Take 1 tablet by mouth daily.  . Multiple Vitamin (MULITIVITAMIN WITH MINERALS) TABS Take 1 tablet by mouth daily.  . Nebulizers (COMPRESSOR/NEBULIZER) MISC Please use with nebulizer solution every 6 hours as needed  for shortness of breath or wheezing.  . pantoprazole (PROTONIX) 40 MG tablet Take 1 tablet (40 mg total) by mouth 2 (two) times daily.  Marland Kitchen PARoxetine (PAXIL) 20 MG tablet Take 1 tablet (20 mg total) by mouth daily.  Marland Kitchen PROAIR HFA 108 (90 Base) MCG/ACT inhaler Inhale 2 puffs by mouth every 6 hours as needed for wheezing or shortness of breath  . simvastatin (ZOCOR) 40 MG tablet Take 1 tablet (40 mg total) by mouth at bedtime.  . vitamin C (ASCORBIC ACID) 500 MG tablet Take 1,000 mg by mouth daily.      1. Benign hypertension  No c/o chest pain, sob or headache. Does not check blood pressure at home.  2. Aortic atherosclerosis ([place])  Seen on chest xray- no symptoms  3. COPD (chronic obstructive pulmonary disease) with chronic bronchitis (HCC)  has chronic cough. She refuses to stop smoking.  4. Gastroesophageal reflux disease without esophagitis  Takes protonix dalyto keep symptoms under control  5. Chronic diarrhea  Takes bently daily- which keeps diarrhea under control  6. Smoker  Patients husband died of lung cancer due to smoking and she still refuses to stop smoking  7. Hypokalemia  No c/o leg cramps  8. Hyperlipidemia with target LDL less than 100  Does not watch diet  9. Recurrent major depressive disorder, in full remission ([place])  She is currently on paxil is doing okay  10. Chronic midline low back pain without sciatica  Has daily back pain. She is on norco 10/325 TID. Rates pain 4/10 currently  11. BMI 32.0-32.9,adult  No recent weight changes  12. Anxiety  She is on xanax tid- I have tried to wean  Her off of meds because eof pain medication but she has been on this for years and gets very anxious when she doe snot have it.    New complaints: None today  Social history: Lives alone.has children that see her often.    Review of Systems  Constitutional: Negative for activity change and appetite change.  HENT: Negative.   Eyes: Negative for pain.  Respiratory:  Negative for shortness of breath.   Cardiovascular: Negative for chest pain, palpitations and leg swelling.  Gastrointestinal: Negative for abdominal pain.  Endocrine: Negative for polydipsia.  Genitourinary: Negative.   Skin: Negative for rash.  Neurological: Negative for dizziness, weakness and headaches.  Hematological: Does not bruise/bleed easily.  Psychiatric/Behavioral: Negative.   All other systems reviewed and are negative.      Objective:   Physical Exam  Constitutional: She is oriented to person, place, and time. She appears well-developed and well-nourished.  HENT:  Nose: Nose normal.  Mouth/Throat: Oropharynx is clear and moist.  Eyes: EOM are normal.  Neck: Trachea normal, normal range of motion and full passive range of motion without pain. Neck supple. No JVD present. Carotid bruit is not present. No thyromegaly present.  Cardiovascular: Normal rate, regular rhythm, normal heart sounds and intact distal pulses. Exam reveals no gallop and no friction rub.  No murmur heard. Pulmonary/Chest: Effort normal. She has wheezes (exp wheezes).  Abdominal: Soft. Bowel sounds are normal. She exhibits no distension and no mass. There is no tenderness.  Musculoskeletal: Normal range of motion.  Lymphadenopathy:    She has no cervical adenopathy.  Neurological: She is alert and oriented to person, place, and time. She has normal reflexes.  Skin: Skin is warm and dry.  Psychiatric: She has a normal mood and affect. Her behavior is normal. Judgment and thought content normal.   BP (!) 147/83   Pulse 79   Temp 97.9 F (36.6 C) (Oral)   Ht 5\' 5"  (1.651 m)   Wt 172 lb (78 kg)   BMI 28.62 kg/m         Assessment & Plan:  1. Benign hypertension Low sodium diet - lisinopril-hydrochlorothiazide (PRINZIDE,ZESTORETIC) 10-12.5 MG tablet; Take 1 tablet by mouth daily.  Dispense: 90 tablet; Refill: 1  2. Aortic atherosclerosis ([place])  3. COPD (chronic obstructive pulmonary  disease) with chronic bronchitis (HCC) STOP SMOKING - levalbuterol (XOPENEX) 1.25 MG/0.5ML nebulizer solution; Take 1.25 mg by nebulization every 6 (six) hours as needed for wheezing or shortness of breath.  Dispense: 1 each; Refill: 12 - albuterol (PROAIR HFA) 108 (90 Base) MCG/ACT inhaler; Inhale 2 puffs by mouth every 6 hours as needed for wheezing or shortness of breath  Dispense: 8.5 g; Refill: 5  4. Gastroesophageal reflux disease without esophagitis Avoid spicy foods Do not eat 2 hours prior to bedtime - pantoprazole (PROTONIX) 40 MG tablet; Take 1 tablet (40 mg total) by mouth 2 (two) times daily.  Dispense: 180 tablet; Refill: 1  5. Chronic diarrhea Force fluids - dicyclomine (BENTYL) 10 MG capsule; Take 1 capsule (10 mg total) by mouth 4 (four) times daily as needed for spasms.  Dispense: 120 capsule; Refill: 5 6. Smoker STOP SMOKING  7. Hypokalemia  8. Hyperlipidemia with target LDL less than 100 Low fat diet - simvastatin (ZOCOR) 40 MG tablet; Take 1 tablet (40 mg total) by mouth at bedtime.  Dispense:  90 tablet; Refill: 1  9. Recurrent major depressive disorder, in full remission ([place]) Stress management - PARoxetine (PAXIL) 20 MG tablet; Take 1 tablet (20 mg total) by mouth daily.  Dispense: 90 tablet; Refill: 1  10. Chronic midline low back pain without sciatica Pain meds filled seechart - HYDROcodone-acetaminophen (NORCO) 10-325 MG tablet; Take 1 tablet by mouth 3 (three) times daily.  Dispense: 90 tablet; Refill: 0 - HYDROcodone-acetaminophen (NORCO) 10-325 MG tablet; Take 1 tablet by mouth every 8 (eight) hours as needed.  Dispense: 90 tablet; Refill: 0 - HYDROcodone-acetaminophen (NORCO) 10-325 MG tablet; Take 1 tablet by mouth every 8 (eight) hours as needed.  Dispense: 30 tablet; Refill: 0   11. BMI 32.0-32.9,adult Discussed diet and exercise for person with BMI >25 Will recheck weight in 3-6 months   12. Anxiety Stress management Patient wanted o incease  xanax dose put was told cannot do - ALPRAZolam (XANAX) 0.5 MG tablet; Take 1 tablet (0.5 mg total) by mouth 3 (three) times daily as needed for anxiety.  Dispense: 90 tablet; Refill: 2     Labs pending Health maintenance reviewed Diet and exercise encouraged Continue all meds Follow up  In 3 months   [place], FNP

## 2018-01-18 ENCOUNTER — Other Ambulatory Visit: Payer: Self-pay | Admitting: Nurse Practitioner

## 2018-01-18 DIAGNOSIS — F419 Anxiety disorder, unspecified: Secondary | ICD-10-CM

## 2018-01-18 NOTE — Telephone Encounter (Signed)
[id number] 4/18

## 2018-01-18 NOTE — Telephone Encounter (Signed)
90 tabs with 2 refills was sent in 4/12 by MMM, receipt confirmed by pharmacy on the order

## 2018-01-18 NOTE — Telephone Encounter (Signed)
Last seen 01/12/18  MMM 

## 2018-01-21 DIAGNOSIS — J441 Chronic obstructive pulmonary disease with (acute) exacerbation: Secondary | ICD-10-CM | POA: Diagnosis not present

## 2018-01-31 ENCOUNTER — Ambulatory Visit (INDEPENDENT_AMBULATORY_CARE_PROVIDER_SITE_OTHER): Payer: Medicare Other

## 2018-01-31 VITALS — BP 133/84 | HR 73 | Temp 98.5°F | Ht 65.0 in | Wt 172.0 lb

## 2018-01-31 DIAGNOSIS — Z0000 Encounter for general adult medical examination without abnormal findings: Secondary | ICD-10-CM

## 2018-01-31 NOTE — Patient Instructions (Signed)
  [name] , Thank you for taking time to come for your Medicare Wellness Visit. I appreciate your ongoing commitment to your health goals. Please review the following plan we discussed and let me know if I can assist you in the future.   These are the goals we discussed: Goals    . Exercise 150 min/wk Moderate Activity       This is a list of the screening recommended for you and due dates:  Health Maintenance  Topic Date Due  . Flu Shot  05/03/2018  . Colon Cancer Screening  05/30/2018  . Mammogram  07/19/2018  . Tetanus Vaccine  01/27/2027  . DEXA scan (bone density measurement)  Completed  .  Hepatitis C: One time screening is recommended by Center for Disease Control  (CDC) for  adults born from [birth year] through 1964.   Completed  . Pneumonia vaccines  Completed

## 2018-01-31 NOTE — Progress Notes (Addendum)
Subjective:   KIMIAH HIBNER is a 72 y.o. female who presents for Medicare Annual (Subsequent) preventive examination. She currently lives in [place] at [facility] apartments by herself. Her sister also lives in [place] a few apartments down from her. She is currently disabled but worked in [employer] all her life. [employer] is where she worked the longest. She enjoys working in her flowers and socializing with all her neighbors. She attends church on a regular basis and walks around the apartment complex daily for exercise. She has 5 children, 2 boys and 3 girls. She states that all her children are wonderful and treat her so well. She has oxygen at home but rarely has to use it now. At one time she was having to use it every night but she has improved greatly and only uses it occasionally.   Review of Systems:   Cardiac Risk Factors include: advanced age (>34men, >11 women);dyslipidemia;hypertension;smoking/ tobacco exposure     Objective:     Vitals: BP 133/84   Pulse 73   Temp 98.5 F (36.9 C) (Oral)   Ht 5\' 5"  (1.651 m)   Wt 172 lb (78 kg)   BMI 28.62 kg/m   Body mass index is 28.62 kg/m.  Advanced Directives 01/31/2018 11/24/2017 11/20/2017 11/19/2017 10/19/2016 10/16/2016 09/16/2016  Does Patient Have a Medical Advance Directive? Yes No No No No No No  Type of Advance Directive Living will - - - - - -  Does patient want to make changes to medical advance directive? No - Patient declined - - - - - -  Would patient like information on creating a medical advance directive? - No - Patient declined No - Patient declined No - Patient declined No - Patient declined No - Patient declined No - Patient declined  Pre-existing out of facility DNR order (yellow form or pink MOST form) - - - - - - -   Patient states that she has a living will but it has never been notarized and its not official. She states that she is not sure she will follow through with having it done properly because her  children are aware of her wishes.   Tobacco Social History   Tobacco Use  Smoking Status Current Every Day Smoker  . Packs/day: 1.00  . Years: 50.00  . Pack years: 50.00  . Types: Cigarettes  Smokeless Tobacco Never Used  Tobacco Comment   since teenager     Ready to quit: Not Answered Counseling given: Not Answered Comment: since teenager Patient is currently a smoker and showed no interest in quitting  Clinical Intake:  Pre-visit preparation completed: Yes  Pain : 0-10 Pain Score: 4  Pain Type: Chronic pain Pain Location: Knee Pain Descriptors / Indicators: Constant Pain Onset: More than a month ago Pain Frequency: Constant Pain Relieving Factors: Pain medication daily  Pain Relieving Factors: Pain medication daily  BMI - recorded: 28.62 Nutritional Status: BMI 25 -29 Overweight Nutritional Risks: None Diabetes: No  How often do you need to have someone help you when you read instructions, pamphlets, or other written materials from your doctor or pharmacy?: 1 - Never What is the last grade level you completed in school?: 9th grade  Interpreter Needed?: No     Past Medical History:  Diagnosis Date  . Angina    with indigestion  . Anxiety   . Arthritis    all over esp. in joints  . Complication of anesthesia   . COPD (  chronic obstructive pulmonary disease) ([place])   . Depression   . Family history of adverse reaction to anesthesia   . GERD (gastroesophageal reflux disease)   . Headache(784.0)    normal h/a  . HPV in female   . HTN (hypertension)   . Hypercholesteremia   . Hypercholesterolemia   . IBS (irritable bowel syndrome)   . Pneumonia 2007  . PONV (postoperative nausea and vomiting)   . S/P colonoscopy 05/30/2008   Dr. Deatra Ina: (records reviewed by AS, NP) findings normal to cecum, scattered diverticula in sigmoid colon   . S/P endoscopy 05/15/2008   Dr. Deatra Ina: (records reviewed by AS,NP): [facility] dilator for stricture distal esophagus, also  noted duodenal stricture, op notes state unable to pass 53mm scope through pylorus  . Shortness of breath    with exertion  . Stomach ulcer    Past Surgical History:  Procedure Laterality Date  . APPENDECTOMY     72 years old  . BACK SURGERY     X 2  . BIOPSY  08/11/2016   Procedure: BIOPSY;  Surgeon: Daneil Dolin, MD;  [facility];  Service: Endoscopy;;  gastric bx's  . BREAST SURGERY     biopsy only  . CARPAL TUNNEL RELEASE     right hand  . CERVICAL BIOPSY  W/ LOOP ELECTRODE EXCISION  02/2011   CIN-2 with focal ectocervical margin involvement. With CIN-1 ECC negative  . CHOLECYSTECTOMY  1984  . ESOPHAGOGASTRODUODENOSCOPY  12/01/2011   Hiatal hernia/Mild fibrotic pyloric stenosis status post dilation with passage of  the scope.  The previously noted gastric ulcer completely healed  The remainder of the gastric mucosa appeared normal  . ESOPHAGOGASTRODUODENOSCOPY (EGD) WITH PROPOFOL N/A 08/11/2016   Procedure: ESOPHAGOGASTRODUODENOSCOPY (EGD) WITH PROPOFOL;  Surgeon: Daneil Dolin, MD;  [facility];  Service: Endoscopy;  Laterality: N/A;  2:15 PM  . FRACTURE SURGERY  10/18/2016   left knee in 3 places  . MALONEY DILATION  07/28/2011   Schatzki's ring s/p 59F with small UES tear as well, small hh, 1cm pyloric channel ulcer, bx benign without H.Pylori. Mobic/goody's at time.  Marland Kitchen MALONEY DILATION N/A 08/11/2016   Procedure: Venia Minks DILATION;  Surgeon: Daneil Dolin, MD;  [facility];  Service: Endoscopy;  Laterality: N/A;  . ORIF PATELLA Left 10/19/2016   Procedure: OPEN REDUCTION INTERNAL (ORIF) FIXATION PATELLA;  Surgeon: Carole Civil, MD;  Location: [facility];  Service: Orthopedics;  Laterality: Left;  . SPINE SURGERY  2009   cervical disc  . SPINE SURGERY  2009   lumbar  . TUBAL LIGATION  1976  . tumor removed from arm     Family History  Problem Relation Age of Onset  . Heart disease Father        CHF  . Heart disease Mother   . COPD  Sister   . Diabetes Sister   . Neuropathy Sister   . COPD Sister   . COPD Sister   . Cancer Sister   . Stroke Brother   . Heart disease Brother   . Colon cancer Neg Hx   . Anesthesia problems Neg Hx   . Hypotension Neg Hx   . Malignant hyperthermia Neg Hx   . Pseudochol deficiency Neg Hx   . Colon polyps Neg Hx    Social History   Socioeconomic History  . Marital status: Widowed    Spouse name: Not on file  . Number of children: 5  .  Years of education: Not on file  . Highest education level: 9th grade  Occupational History  . Occupation: disability    Employer: [employer]    Employer: [employer]  . Financial resource strain: Not hard at all  . Food insecurity:    Worry: Never true    Inability: Never true  . Transportation needs:    Medical: No    Non-medical: No  Tobacco Use  . Smoking status: Current Every Day Smoker    Packs/day: 1.00    Years: 50.00    Pack years: 50.00    Types: Cigarettes  . Smokeless tobacco: Never Used  . Tobacco comment: since teenager  Substance and Sexual Activity  . Alcohol use: No    Comment: hx of ETOH abuse in past, none in 10 years  . Drug use: No  . Sexual activity: Not Currently    Birth control/protection: Surgical  Lifestyle  . Physical activity:    Days per week: 5 days    Minutes per session: 20 min  . Stress: Not at all  Relationships  . Social connections:    Talks on phone: More than three times a week    Gets together: More than three times a week    Attends religious service: More than 4 times per year    Active member of club or organization: No    Attends meetings of clubs or organizations: Never    Relationship status: Widowed  Other Topics Concern  . Not on file  Social History Narrative   Husband passed away 05/02/15 from lung cancer/leukemia.     Outpatient Encounter Medications as of 01/31/2018  Medication Sig  . albuterol (PROAIR HFA) 108 (90 Base) MCG/ACT inhaler Inhale 2  puffs by mouth every 6 hours as needed for wheezing or shortness of breath  . ALPRAZolam (XANAX) 0.5 MG tablet Take 1 tablet (0.5 mg total) by mouth 3 (three) times daily as needed for anxiety.  . Aspirin-Acetaminophen-Caffeine (GOODYS EXTRA STRENGTH) 500-325-65 MG PACK Take 1 packet by mouth every 8 (eight) hours as needed (for pain.).  Marland Kitchen aspirin-sod bicarb-citric acid (ALKA-SELTZER) 325 MG TBEF tablet Take 325 mg by mouth every 6 (six) hours as needed (cold/congestion).  . calcium carbonate (OS-CAL) 600 MG TABS Take 600 mg by mouth daily.   . Cholecalciferol (VITAMIN D3) 1000 UNITS CAPS Take 3,000 mg by mouth daily.   Marland Kitchen dicyclomine (BENTYL) 10 MG capsule Take 1 capsule (10 mg total) by mouth 4 (four) times daily as needed for spasms.  . diphenhydrAMINE (BENADRYL) 25 MG tablet Take 25 mg by mouth 2 (two) times daily as needed. For Allergies  . fluticasone (FLONASE) 50 MCG/ACT nasal spray Place 2 sprays into both nostrils daily.  Marland Kitchen HYDROcodone-acetaminophen (NORCO) 10-325 MG tablet Take 1 tablet by mouth 3 (three) times daily.  Derrill Memo ON 02/11/2018] HYDROcodone-acetaminophen (NORCO) 10-325 MG tablet Take 1 tablet by mouth every 8 (eight) hours as needed.  Derrill Memo ON 03/13/2018] HYDROcodone-acetaminophen (NORCO) 10-325 MG tablet Take 1 tablet by mouth every 8 (eight) hours as needed.  Marland Kitchen ipratropium (ATROVENT) 0.02 % nebulizer solution Take 2.5 mLs (0.5 mg total) by nebulization every 6 (six) hours as needed for wheezing or shortness of breath.  . levalbuterol (XOPENEX) 1.25 MG/0.5ML nebulizer solution Take 1.25 mg by nebulization every 6 (six) hours as needed for wheezing or shortness of breath.  . lisinopril-hydrochlorothiazide (PRINZIDE,ZESTORETIC) 10-12.5 MG tablet Take 1 tablet by mouth daily.  . Multiple Vitamin (  MULITIVITAMIN WITH MINERALS) TABS Take 1 tablet by mouth daily.  . Nebulizers (COMPRESSOR/NEBULIZER) MISC Please use with nebulizer solution every 6 hours as needed for shortness of  breath or wheezing.  . pantoprazole (PROTONIX) 40 MG tablet Take 1 tablet (40 mg total) by mouth 2 (two) times daily.  Marland Kitchen PARoxetine (PAXIL) 20 MG tablet Take 1 tablet (20 mg total) by mouth daily.  . simvastatin (ZOCOR) 40 MG tablet Take 1 tablet (40 mg total) by mouth at bedtime.  . vitamin C (ASCORBIC ACID) 500 MG tablet Take 1,000 mg by mouth daily.    No facility-administered encounter medications on file as of 01/31/2018.     Activities of Daily Living In your present state of health, do you have any difficulty performing the following activities: 01/31/2018 11/20/2017  Hearing? Y N  Comment Slight loss of hearing -  Vision? Y N  Comment Wears glasses all the time -  Difficulty concentrating or making decisions? N N  Walking or climbing stairs? N N  Dressing or bathing? N N  Doing errands, shopping? N N  Preparing Food and eating ? N -  Using the Toilet? N -  In the past six months, have you accidently leaked urine? N -  Do you have problems with loss of bowel control? N -  Managing your Medications? N -  Managing your Finances? N -  Housekeeping or managing your Housekeeping? N -  Some recent data might be hidden    Patient Care Team: Chevis Pretty, FNP as PCP - General (Nurse Practitioner) Daneil Dolin, MD (Gastroenterology)    Assessment:   This is a routine wellness examination for [place].  Exercise Activities and Dietary recommendations Current Exercise Habits: The patient does not participate in regular exercise at present, Exercise limited by: respiratory conditions(s)  Goals    . Exercise 150 min/wk Moderate Activity       Fall Risk Fall Risk  01/31/2018 01/12/2018 11/30/2017 10/16/2017 07/12/2017  Falls in the past year? No No No No Yes  Number falls in past yr: - - - - 1  Injury with Fall? - - - - No  Comment - - - - -   Is the patient's home free of loose throw rugs in walkways, pet beds, electrical cords, etc?   yes      Grab bars in the  bathroom? no      Handrails on the stairs?   yes      Adequate lighting?   yes   Depression Screen PHQ 2/9 Scores 01/31/2018 01/12/2018 11/30/2017 10/16/2017  PHQ - 2 Score 0 0 0 0  PHQ- 9 Score - - - -  Exception Documentation - - - -     Cognitive Function MMSE - Mini Mental State Exam 01/31/2018 01/26/2017  Orientation to time 5 5  Orientation to Place 5 5  Registration 3 3  Attention/ Calculation 5 0  Recall 3 1  Language- name 2 objects 2 2  Language- repeat 1 1  Language- follow 3 step command 3 3  Language- read & follow direction 1 1  Write a sentence 1 1  Copy design 1 1  Total score 30 23        Immunization History  Administered Date(s) Administered  . Influenza Split 11/27/2012  . Influenza, High Dose Seasonal PF 07/01/2014, 08/12/2015, 07/12/2017  . Influenza,inj,Quad PF,6+ Mos 06/24/2013  . Pneumococcal Conjugate-13 06/24/2014  . Pneumococcal Polysaccharide-23 11/27/2012, 08/12/2015  . Tdap 01/26/2017  Qualifies for Shingles Vaccine?  Patient qualifies but refuses vaccine at this time due to cost  Screening Tests Health Maintenance  Topic Date Due  . INFLUENZA VACCINE  05/03/2018  . COLONOSCOPY  05/30/2018  . MAMMOGRAM  07/19/2018  . TETANUS/TDAP  01/27/2027  . DEXA SCAN  Completed  . Hepatitis C Screening  Completed  . PNA vac Low Risk Adult  Completed    Cancer Screenings: Lung: Low Dose CT Chest recommended if Age 50-80 years, 30 pack-year currently smoking OR have quit w/in 15years. Patient does not qualify. She had a CT of Chest on 08/02/2017. She is due to have it rechecked again this Oct.   Breast:  Up to date on Mammogram? No  Patient has an upcoming appt for a mammogram. She states she has these done at [facility] to date of Bone Density/Dexa? No  Patient will have this done at her follow up appt in July with PCP  Colorectal: Patient last had this done in 2009 and is due the end of this year  Additional Screenings:  Hepatitis C  Screening:  Completed on 12/25/2015 with a negative result     Plan:     I have personally reviewed and noted the following in the patient's chart:   . Medical and social history . Use of alcohol, tobacco or illicit drugs  . Current medications and supplements . Functional ability and status . Nutritional status . Physical activity . Advanced directives . List of other physicians . Hospitalizations, surgeries, and ER visits in previous 12 months . Vitals . Screenings to include cognitive, depression, and falls . Referrals and appointments  In addition, I have reviewed and discussed with patient certain preventive protocols, quality metrics, and best practice recommendations. A written personalized care plan for preventive services as well as general preventive health recommendations were provided to patient.     Rolena Infante, LPN  01/03/8767   I have reviewed and agree with the above AWV documentation.   Assunta Found, MD [facility] 02/03/2018, 1:09 PM

## 2018-02-20 DIAGNOSIS — J441 Chronic obstructive pulmonary disease with (acute) exacerbation: Secondary | ICD-10-CM | POA: Diagnosis not present

## 2018-03-15 ENCOUNTER — Other Ambulatory Visit: Payer: Self-pay | Admitting: Nurse Practitioner

## 2018-03-15 DIAGNOSIS — G8929 Other chronic pain: Secondary | ICD-10-CM

## 2018-03-15 DIAGNOSIS — M545 Low back pain: Principal | ICD-10-CM

## 2018-03-15 MED ORDER — HYDROCODONE-ACETAMINOPHEN 10-325 MG PO TABS: 1.0000 | ORAL_TABLET | Freq: Three times a day (TID) | ORAL | 0 refills | Status: DC | PRN

## 2018-03-20 ENCOUNTER — Ambulatory Visit: Payer: Medicare Other | Admitting: Nurse Practitioner

## 2018-03-23 DIAGNOSIS — J441 Chronic obstructive pulmonary disease with (acute) exacerbation: Secondary | ICD-10-CM | POA: Diagnosis not present

## 2018-03-27 ENCOUNTER — Telehealth: Payer: Self-pay | Admitting: Nurse Practitioner

## 2018-03-27 DIAGNOSIS — M545 Low back pain, unspecified: Secondary | ICD-10-CM

## 2018-03-27 DIAGNOSIS — G8929 Other chronic pain: Secondary | ICD-10-CM

## 2018-03-27 MED ORDER — HYDROCODONE-ACETAMINOPHEN 10-325 MG PO TABS: 1.0000 | ORAL_TABLET | Freq: Three times a day (TID) | ORAL | 0 refills | Status: DC

## 2018-03-27 NOTE — Telephone Encounter (Signed)
Pt aware refill sent to pharmacy 

## 2018-03-27 NOTE — Telephone Encounter (Signed)
rx sent in should last you till mid july

## 2018-04-10 DIAGNOSIS — Z1231 Encounter for screening mammogram for malignant neoplasm of breast: Secondary | ICD-10-CM | POA: Diagnosis not present

## 2018-04-13 ENCOUNTER — Ambulatory Visit (INDEPENDENT_AMBULATORY_CARE_PROVIDER_SITE_OTHER): Payer: Medicare Other | Admitting: Nurse Practitioner

## 2018-04-13 ENCOUNTER — Encounter: Payer: Self-pay | Admitting: Nurse Practitioner

## 2018-04-13 VITALS — BP 142/76 | HR 70 | Temp 98.2°F | Ht 65.0 in | Wt 176.0 lb

## 2018-04-13 DIAGNOSIS — E876 Hypokalemia: Secondary | ICD-10-CM | POA: Diagnosis not present

## 2018-04-13 DIAGNOSIS — I10 Essential (primary) hypertension: Secondary | ICD-10-CM

## 2018-04-13 DIAGNOSIS — F3342 Major depressive disorder, recurrent, in full remission: Secondary | ICD-10-CM | POA: Diagnosis not present

## 2018-04-13 DIAGNOSIS — G8929 Other chronic pain: Secondary | ICD-10-CM | POA: Diagnosis not present

## 2018-04-13 DIAGNOSIS — M545 Low back pain: Secondary | ICD-10-CM

## 2018-04-13 DIAGNOSIS — J449 Chronic obstructive pulmonary disease, unspecified: Secondary | ICD-10-CM

## 2018-04-13 DIAGNOSIS — Z6832 Body mass index (BMI) 32.0-32.9, adult: Secondary | ICD-10-CM

## 2018-04-13 DIAGNOSIS — F419 Anxiety disorder, unspecified: Secondary | ICD-10-CM

## 2018-04-13 DIAGNOSIS — F17200 Nicotine dependence, unspecified, uncomplicated: Secondary | ICD-10-CM | POA: Diagnosis not present

## 2018-04-13 DIAGNOSIS — J4489 Other specified chronic obstructive pulmonary disease: Secondary | ICD-10-CM

## 2018-04-13 DIAGNOSIS — K219 Gastro-esophageal reflux disease without esophagitis: Secondary | ICD-10-CM

## 2018-04-13 DIAGNOSIS — E785 Hyperlipidemia, unspecified: Secondary | ICD-10-CM

## 2018-04-13 MED ORDER — HYDROCODONE-ACETAMINOPHEN 10-325 MG PO TABS: 1.0000 | ORAL_TABLET | Freq: Three times a day (TID) | ORAL | 0 refills | Status: DC | PRN

## 2018-04-13 MED ORDER — ALPRAZOLAM 0.5 MG PO TABS: 0.5000 mg | ORAL_TABLET | Freq: Three times a day (TID) | ORAL | 2 refills | Status: DC | PRN

## 2018-04-13 MED ORDER — HYDROCODONE-ACETAMINOPHEN 10-325 MG PO TABS: 1.0000 | ORAL_TABLET | Freq: Three times a day (TID) | ORAL | 0 refills | Status: DC

## 2018-04-13 MED ORDER — HYDROCODONE-ACETAMINOPHEN 10-325 MG PO TABS
1.0000 | ORAL_TABLET | Freq: Three times a day (TID) | ORAL | 0 refills | Status: DC
Start: 2018-04-20 — End: 2018-05-20

## 2018-04-13 NOTE — Patient Instructions (Signed)
Stress and Stress Management Stress is a normal reaction to life events. It is what you feel when life demands more than you are used to or more than you can handle. Some stress can be useful. For example, the stress reaction can help you catch the last bus of the day, study for a test, or meet a deadline at work. But stress that occurs too often or for too long can cause problems. It can affect your emotional health and interfere with relationships and normal daily activities. Too much stress can weaken your immune system and increase your risk for physical illness. If you already have a medical problem, stress can make it worse. What are the causes? All sorts of life events may cause stress. An event that causes stress for one person may not be stressful for another person. Major life events commonly cause stress. These may be positive or negative. Examples include losing your job, moving into a new home, getting married, having a baby, or losing a loved one. Less obvious life events may also cause stress, especially if they occur day after day or in combination. Examples include working long hours, driving in traffic, caring for children, being in debt, or being in a difficult relationship. What are the signs or symptoms? Stress may cause emotional symptoms including, the following:  Anxiety. This is feeling worried, afraid, on edge, overwhelmed, or out of control.  Anger. This is feeling irritated or impatient.  Depression. This is feeling sad, down, helpless, or guilty.  Difficulty focusing, remembering, or making decisions.  Stress may cause physical symptoms, including the following:  Aches and pains. These may affect your head, neck, back, stomach, or other areas of your body.  Tight muscles or clenched jaw.  Low energy or trouble sleeping.  Stress may cause unhealthy behaviors, including the following:  Eating to feel better (overeating) or skipping meals.  Sleeping too little,  too much, or both.  Working too much or putting off tasks (procrastination).  Smoking, drinking alcohol, or using drugs to feel better.  How is this diagnosed? Stress is diagnosed through an assessment by your health care provider. Your health care provider will ask questions about your symptoms and any stressful life events.Your health care provider will also ask about your medical history and may order blood tests or other tests. Certain medical conditions and medicine can cause physical symptoms similar to stress. Mental illness can cause emotional symptoms and unhealthy behaviors similar to stress. Your health care provider may refer you to a mental health professional for further evaluation. How is this treated? Stress management is the recommended treatment for stress.The goals of stress management are reducing stressful life events and coping with stress in healthy ways. Techniques for reducing stressful life events include the following:  Stress identification. Self-monitor for stress and identify what causes stress for you. These skills may help you to avoid some stressful events.  Time management. Set your priorities, keep a calendar of events, and learn to say "no." These tools can help you avoid making too many commitments.  Techniques for coping with stress include the following:  Rethinking the problem. Try to think realistically about stressful events rather than ignoring them or overreacting. Try to find the positives in a stressful situation rather than focusing on the negatives.  Exercise. Physical exercise can release both physical and emotional tension. The key is to find a form of exercise you enjoy and do it regularly.  Relaxation techniques. These relax the body and  mind. Examples include yoga, meditation, tai chi, biofeedback, deep breathing, progressive muscle relaxation, listening to music, being out in nature, journaling, and other hobbies. Again, the key is to find  one or more that you enjoy and can do regularly.  Healthy lifestyle. Eat a balanced diet, get plenty of sleep, and do not smoke. Avoid using alcohol or drugs to relax.  Strong support network. Spend time with family, friends, or other people you enjoy being around.Express your feelings and talk things over with someone you trust.  Counseling or talktherapy with a mental health professional may be helpful if you are having difficulty managing stress on your own. Medicine is typically not recommended for the treatment of stress.Talk to your health care provider if you think you need medicine for symptoms of stress. Follow these instructions at home:  Keep all follow-up visits as directed by your health care provider.  Take all medicines as directed by your health care provider. Contact a health care provider if:  Your symptoms get worse or you start having new symptoms.  You feel overwhelmed by your problems and can no longer manage them on your own. Get help right away if:  You feel like hurting yourself or someone else. This information is not intended to replace advice given to you by your health care provider. Make sure you discuss any questions you have with your health care provider. Document Released: 03/15/2001 Document Revised: 02/25/2016 Document Reviewed: 05/14/2013 Elsevier Interactive Patient Education  2017 [employer] Inc[employer]  

## 2018-04-13 NOTE — Progress Notes (Signed)
Subjective:    Patient ID: [name], female    DOB: 1946-03-21, 72 y.o.   MRN: [id number]   Chief Complaint: Medical Management of Chronic Issues   HPI:  1. Benign hypertension  No c/o chest pain, sob or headache. Does not check blood pressure at home. BP Readings from Last 3 Encounters:  04/13/18 (!) 142/76  01/31/18 133/84  01/12/18 (!) 147/83     2. COPD (chronic obstructive pulmonary disease) with chronic bronchitis (H[facility])  Has cough and is still smoking  3. Gastroesophageal reflux disease without esophagitis  Takes protonix daily- keeps systems under control  4. Smoker smokes about 1 pack per day- has no will power to quit  5. Hypokalemia no c/o lower ext cramping  6. Hyperlipidemia with target LDL less than 100  Does not watch diet very well. Walks 3-4 x a week  7. Recurrent major depressive disorder, in full remission ([place])  Takes paxil daily. Says she is doing well. Depression screen [facility] 2/9 04/13/2018 01/31/2018 01/12/2018  Decreased Interest 0 0 0  Down, Depressed, Hopeless 0 0 0  PHQ - 2 Score 0 0 0  Altered sleeping - - -  Tired, decreased energy - - -  Change in appetite - - -  Feeling bad or failure about yourself  - - -  Trouble concentrating - - -  Moving slowly or fidgety/restless - - -  Suicidal thoughts - - -  PHQ-9 Score - - -  Difficult doing work/chores - - -     8. Chronic midline low back pain without sciatica  Pain assessment: Cause of pain- low back pain Pain location- lower back Pain on scale of 1-10- 6/10 today Frequency- daily What increases pain-lots of activity What makes pain Better-rest Effects on ADL - is able to get done what she needs to get done Any change in general medical condition-none  Current medications- norco 10/325 bid Effectiveness of current meds-helps but odes not completely relieve pain Adverse reactions form pain meds-none Morphine equivalent 30  Pill count performed-No Urine drug screen- No Was  the [place] reviewed- yes  If yes were their any concerning findings? - none     9. BMI 32.0-32.9,adult  Weight up 4lbs  10. Anxiety  stays anxious. I have tried to get her off xanax because esh eis on pain meds but have not been able to do.    Outpatient Encounter Medications as of 04/13/2018  Medication Sig  . albuterol (PROAIR HFA) 108 (90 Base) MCG/ACT inhaler Inhale 2 puffs by mouth every 6 hours as needed for wheezing or shortness of breath  . ALPRAZolam (XANAX) 0.5 MG tablet Take 1 tablet (0.5 mg total) by mouth 3 (three) times daily as needed for anxiety.  . Aspirin-Acetaminophen-Caffeine (GOODYS EXTRA STRENGTH) 500-325-65 MG PACK Take 1 packet by mouth every 8 (eight) hours as needed (for pain.).  Marland Kitchen aspirin-sod bicarb-citric acid (ALKA-SELTZER) 325 MG TBEF tablet Take 325 mg by mouth every 6 (six) hours as needed (cold/congestion).  . calcium carbonate (OS-CAL) 600 MG TABS Take 600 mg by mouth daily.   . Cholecalciferol (VITAMIN D3) 1000 UNITS CAPS Take 3,000 mg by mouth daily.   Marland Kitchen dicyclomine (BENTYL) 10 MG capsule Take 1 capsule (10 mg total) by mouth 4 (four) times daily as needed for spasms.  . diphenhydrAMINE (BENADRYL) 25 MG tablet Take 25 mg by mouth 2 (two) times daily as needed. For Allergies  . fluticasone (FLONASE) 50 MCG/ACT nasal spray Place 2 sprays  into both nostrils daily.  Marland Kitchen HYDROcodone-acetaminophen (NORCO) 10-325 MG tablet Take 1 tablet by mouth 3 (three) times daily for 20 days.  Marland Kitchen ipratropium (ATROVENT) 0.02 % nebulizer solution Take 2.5 mLs (0.5 mg total) by nebulization every 6 (six) hours as needed for wheezing or shortness of breath.  . levalbuterol (XOPENEX) 1.25 MG/0.5ML nebulizer solution Take 1.25 mg by nebulization every 6 (six) hours as needed for wheezing or shortness of breath.  . lisinopril-hydrochlorothiazide (PRINZIDE,ZESTORETIC) 10-12.5 MG tablet Take 1 tablet by mouth daily.  . Multiple Vitamin (MULITIVITAMIN WITH MINERALS) TABS Take 1 tablet by  mouth daily.  . Nebulizers (COMPRESSOR/NEBULIZER) MISC Please use with nebulizer solution every 6 hours as needed for shortness of breath or wheezing.  . pantoprazole (PROTONIX) 40 MG tablet Take 1 tablet (40 mg total) by mouth 2 (two) times daily.  Marland Kitchen PARoxetine (PAXIL) 20 MG tablet Take 1 tablet (20 mg total) by mouth daily.  . simvastatin (ZOCOR) 40 MG tablet Take 1 tablet (40 mg total) by mouth at bedtime.  . vitamin C (ASCORBIC ACID) 500 MG tablet Take 1,000 mg by mouth daily.        New complaints: None today  Social history: Lives by herself but family checks on her daily   Review of Systems  Constitutional: Negative for activity change and appetite change.  HENT: Negative.   Eyes: Negative for pain.  Respiratory: Negative for shortness of breath.   Cardiovascular: Negative for chest pain, palpitations and leg swelling.  Gastrointestinal: Negative for abdominal pain.  Endocrine: Negative for polydipsia.  Genitourinary: Negative.   Skin: Negative for rash.  Neurological: Negative for dizziness, weakness and headaches.  Hematological: Does not bruise/bleed easily.  Psychiatric/Behavioral: Negative.   All other systems reviewed and are negative.      Objective:   Physical Exam  Constitutional: She is oriented to person, place, and time. She appears well-developed and well-nourished. No distress.  HENT:  Head: Normocephalic.  Nose: Nose normal.  Mouth/Throat: Oropharynx is clear and moist.  Eyes: Pupils are equal, round, and reactive to light. EOM are normal.  Neck: Normal range of motion. Neck supple. No JVD present. Carotid bruit is not present.  Cardiovascular: Normal rate, regular rhythm, normal heart sounds and intact distal pulses.  Pulmonary/Chest: Effort normal and breath sounds normal. No respiratory distress. She has no wheezes. She has no rales. She exhibits no tenderness.  Abdominal: Soft. Normal appearance, normal aorta and bowel sounds are normal. She  exhibits no distension, no abdominal bruit, no pulsatile midline mass and no mass. There is no splenomegaly or hepatomegaly. There is no tenderness.  Musculoskeletal: Normal range of motion. She exhibits no edema.  Lymphadenopathy:    She has no cervical adenopathy.  Neurological: She is alert and oriented to person, place, and time. She has normal reflexes.  Skin: Skin is warm and dry.  Psychiatric: She has a normal mood and affect. Her behavior is normal. Judgment and thought content normal.  Nursing note and vitals reviewed.  BP (!) 142/76   Pulse 70   Temp 98.2 F (36.8 C) (Oral)   Ht 5\' 5"  (1.651 m)   Wt 176 lb (79.8 kg)   BMI 29.29 kg/m       Assessment & Plan:  NEIDA ELLEGOOD comes in today with chief complaint of Medical Management of Chronic Issues   Diagnosis and orders addressed:  1. Benign hypertension Low sodium diet  2. COPD (chronic obstructive pulmonary disease) with chronic bronchitis ([place])  3.  Gastroesophageal reflux disease without esophagitis Avoid spicy foods Do not eat 2 hours prior to bedtime  4. Smoker Smoking cessation discussed  5. Hypokalemia  6. Hyperlipidemia with target LDL less than 100 Low fat diet  7. Recurrent major depressive disorder, in full remission ([place]) Stress management  8. Chronic midline low back pain without sciatica Meds ordered this encounter  Medications  . HYDROcodone-acetaminophen (NORCO) 10-325 MG tablet    Sig: Take 1 tablet by mouth 3 (three) times daily.    Dispense:  90 tablet    Refill:  0    Order Specific Question:   Supervising Provider    Answer:   VINCENT, CAROL L [[id number]]  . HYDROcodone-acetaminophen (NORCO) 10-325 MG tablet    Sig: Take 1 tablet by mouth 3 (three) times daily.    Dispense:  90 tablet    Refill:  0    Order Specific Question:   Supervising Provider    Answer:   VINCENT, CAROL L [[id number]]  . HYDROcodone-acetaminophen (NORCO) 10-325 MG tablet    Sig: Take 1 tablet by mouth every 8  (eight) hours as needed.    Dispense:  90 tablet    Refill:  0    Order Specific Question:   Supervising Provider    Answer:   VINCENT, CAROL L [[id number]]    9. BMI 32.0-32.9,adult Discussed diet and exercise for person with BMI >25 Will recheck weight in 3-6 months  10. Anxiety - ALPRAZolam (XANAX) 0.5 MG tablet; Take 1 tablet (0.5 mg total) by mouth 3 (three) times daily as needed for anxiety.  Dispense: 90 tablet; Refill: 2   Labs pending Health Maintenance reviewed Diet and exercise encouraged  Follow up plan: 3 months   [name], FNP

## 2018-04-22 DIAGNOSIS — J441 Chronic obstructive pulmonary disease with (acute) exacerbation: Secondary | ICD-10-CM | POA: Diagnosis not present

## 2018-05-06 ENCOUNTER — Other Ambulatory Visit: Payer: Self-pay | Admitting: Nurse Practitioner

## 2018-05-06 DIAGNOSIS — J209 Acute bronchitis, unspecified: Secondary | ICD-10-CM

## 2018-05-10 ENCOUNTER — Other Ambulatory Visit: Payer: Self-pay

## 2018-05-10 DIAGNOSIS — Z9289 Personal history of other medical treatment: Secondary | ICD-10-CM

## 2018-05-23 DIAGNOSIS — J441 Chronic obstructive pulmonary disease with (acute) exacerbation: Secondary | ICD-10-CM | POA: Diagnosis not present

## 2018-06-23 DIAGNOSIS — J441 Chronic obstructive pulmonary disease with (acute) exacerbation: Secondary | ICD-10-CM | POA: Diagnosis not present

## 2018-07-04 ENCOUNTER — Other Ambulatory Visit: Payer: Self-pay | Admitting: Nurse Practitioner

## 2018-07-04 DIAGNOSIS — F419 Anxiety disorder, unspecified: Secondary | ICD-10-CM

## 2018-07-06 ENCOUNTER — Other Ambulatory Visit: Payer: Self-pay | Admitting: Nurse Practitioner

## 2018-07-06 DIAGNOSIS — F419 Anxiety disorder, unspecified: Secondary | ICD-10-CM

## 2018-07-09 ENCOUNTER — Telehealth: Payer: Self-pay | Admitting: Nurse Practitioner

## 2018-07-09 DIAGNOSIS — F419 Anxiety disorder, unspecified: Secondary | ICD-10-CM

## 2018-07-09 MED ORDER — ALPRAZOLAM 0.5 MG PO TABS: 0.5000 mg | ORAL_TABLET | Freq: Three times a day (TID) | ORAL | 2 refills | Status: DC | PRN

## 2018-07-09 NOTE — Telephone Encounter (Signed)
What is the name of the medication? Alprazolam 0.5 mg Patient states it was denied and she has appt 10-10 with MMM for three month appt.  Have you contacted your pharmacy to request a refill? YES  Which pharmacy would you like this sent to? [facility]   Patient notified that their request is being sent to the clinical staff for review and that they should receive a call once it is complete. If they do not receive a call within 24 hours they can check with their pharmacy or our office.

## 2018-07-12 ENCOUNTER — Ambulatory Visit (INDEPENDENT_AMBULATORY_CARE_PROVIDER_SITE_OTHER): Payer: Medicare Other | Admitting: Nurse Practitioner

## 2018-07-12 ENCOUNTER — Encounter: Payer: Self-pay | Admitting: Nurse Practitioner

## 2018-07-12 VITALS — BP 124/79 | HR 72 | Temp 98.8°F | Ht 65.0 in | Wt 178.0 lb

## 2018-07-12 DIAGNOSIS — F3342 Major depressive disorder, recurrent, in full remission: Secondary | ICD-10-CM

## 2018-07-12 DIAGNOSIS — J449 Chronic obstructive pulmonary disease, unspecified: Secondary | ICD-10-CM

## 2018-07-12 DIAGNOSIS — I10 Essential (primary) hypertension: Secondary | ICD-10-CM

## 2018-07-12 DIAGNOSIS — E785 Hyperlipidemia, unspecified: Secondary | ICD-10-CM

## 2018-07-12 DIAGNOSIS — E876 Hypokalemia: Secondary | ICD-10-CM

## 2018-07-12 DIAGNOSIS — I700 Atherosclerosis of aorta: Secondary | ICD-10-CM | POA: Diagnosis not present

## 2018-07-12 DIAGNOSIS — F17200 Nicotine dependence, unspecified, uncomplicated: Secondary | ICD-10-CM

## 2018-07-12 DIAGNOSIS — G8929 Other chronic pain: Secondary | ICD-10-CM

## 2018-07-12 DIAGNOSIS — K219 Gastro-esophageal reflux disease without esophagitis: Secondary | ICD-10-CM

## 2018-07-12 DIAGNOSIS — F419 Anxiety disorder, unspecified: Secondary | ICD-10-CM

## 2018-07-12 DIAGNOSIS — M545 Low back pain: Secondary | ICD-10-CM

## 2018-07-12 DIAGNOSIS — Z6832 Body mass index (BMI) 32.0-32.9, adult: Secondary | ICD-10-CM

## 2018-07-12 DIAGNOSIS — Z23 Encounter for immunization: Secondary | ICD-10-CM

## 2018-07-12 MED ORDER — SIMVASTATIN 40 MG PO TABS: 40.0000 mg | ORAL_TABLET | Freq: Every day | ORAL | 1 refills | Status: DC

## 2018-07-12 MED ORDER — HYDROCODONE-ACETAMINOPHEN 10-325 MG PO TABS: 1.0000 | ORAL_TABLET | Freq: Three times a day (TID) | ORAL | 0 refills | Status: DC | PRN

## 2018-07-12 MED ORDER — PANTOPRAZOLE SODIUM 40 MG PO TBEC: 40.0000 mg | DELAYED_RELEASE_TABLET | Freq: Two times a day (BID) | ORAL | 1 refills | Status: DC

## 2018-07-12 MED ORDER — PAROXETINE HCL 20 MG PO TABS: 20.0000 mg | ORAL_TABLET | Freq: Every day | ORAL | 1 refills | Status: DC

## 2018-07-12 MED ORDER — LISINOPRIL-HYDROCHLOROTHIAZIDE 10-12.5 MG PO TABS: 1.0000 | ORAL_TABLET | Freq: Every day | ORAL | 1 refills | Status: DC

## 2018-07-12 NOTE — Patient Instructions (Signed)
Steps to Quit Smoking Smoking tobacco can be bad for your health. It can also affect almost every organ in your body. Smoking puts you and people around you at risk for many serious long-lasting (chronic) diseases. Quitting smoking is hard, but it is one of the best things that you can do for your health. It is never too late to quit. What are the benefits of quitting smoking? When you quit smoking, you lower your risk for getting serious diseases and conditions. They can include:  Lung cancer or lung disease.  Heart disease.  Stroke.  Heart attack.  Not being able to have children (infertility).  Weak bones (osteoporosis) and broken bones (fractures).  If you have coughing, wheezing, and shortness of breath, those symptoms may get better when you quit. You may also get sick less often. If you are pregnant, quitting smoking can help to lower your chances of having a baby of low birth weight. What can I do to help me quit smoking? Talk with your doctor about what can help you quit smoking. Some things you can do (strategies) include:  Quitting smoking totally, instead of slowly cutting back how much you smoke over a period of time.  Going to in-person counseling. You are more likely to quit if you go to many counseling sessions.  Using resources and support systems, such as: ? Online chats with a counselor. ? Phone quitlines. ? Printed self-help materials. ? Support groups or group counseling. ? Text messaging programs. ? Mobile phone apps or applications.  Taking medicines. Some of these medicines may have nicotine in them. If you are pregnant or breastfeeding, do not take any medicines to quit smoking unless your doctor says it is okay. Talk with your doctor about counseling or other things that can help you.  Talk with your doctor about using more than one strategy at the same time, such as taking medicines while you are also going to in-person counseling. This can help make  quitting easier. What things can I do to make it easier to quit? Quitting smoking might feel very hard at first, but there is a lot that you can do to make it easier. Take these steps:  Talk to your family and friends. Ask them to support and encourage you.  Call phone quitlines, reach out to support groups, or work with a counselor.  Ask people who smoke to not smoke around you.  Avoid places that make you want (trigger) to smoke, such as: ? Bars. ? Parties. ? Smoke-break areas at work.  Spend time with people who do not smoke.  Lower the stress in your life. Stress can make you want to smoke. Try these things to help your stress: ? Getting regular exercise. ? Deep-breathing exercises. ? Yoga. ? Meditating. ? Doing a body scan. To do this, close your eyes, focus on one area of your body at a time from head to toe, and notice which parts of your body are tense. Try to relax the muscles in those areas.  Download or buy apps on your mobile phone or tablet that can help you stick to your quit plan. There are many free apps, such as QuitGuide from the CDC (Centers for Disease Control and Prevention). You can find more support from smokefree.gov and other websites.  This information is not intended to replace advice given to you by your health care provider. Make sure you discuss any questions you have with your health care provider. Document Released: 07/16/2009 Document   Revised: 05/17/2016 Document Reviewed: 02/03/2015 Elsevier Interactive Patient Education  2018 Elsevier Inc.  

## 2018-07-12 NOTE — Progress Notes (Signed)
Subjective:    Patient ID: [name], female    DOB: 08/19/1946, 72 y.o.   MRN: [id number]   Chief Complaint: Medical Management of Chronic Issues   HPI:  1. Aortic atherosclerosis ([place])  Was seen on chest xray- monioring  2. Benign hypertension  No c/o chest pain, sob or headache. Does not check blood pressure at home. BP Readings from Last 3 Encounters:  04/13/18 (!) 142/76  01/31/18 133/84  01/12/18 (!) 147/83     3. COPD (chronic obstructive pulmonary disease) with chronic bronchitis (HCC) Has occasional cough. Still smokes , has not needed xopenex lately  4. Gastroesophageal reflux disease without esophagitis  Takes protonix daily. Works well to keep symptoms under control  5. Anxiety  Takes xanax tid- keeps very anxious when she does not take. I hav etried to wean her down with no success.  6. Chronic midline low back pain without sciatica  Pain assessment: Cause of pain- DDD Pain location- low back with occasional sciatica Pain on scale of 1-10- 2/10 Frequency- daily What increases pain-with to much activity What makes pain Better-rest and pain meds Effects on ADL - makes herself do what she needs to Any change in general medical condition-none  Current medications- norco 10/325 Effectiveness of current meds-helps Adverse reactions form pain meds-none Morphine equivalent- 30  Pill count performed-No Urine drug screen- No Was the [place] reviewed- yes  If yes were their any concerning findings? - no  Pain contract signed on:07/12/18   7. Hypokalemia  No c/o lower ext cramping. Is no on potassium supplement.  8. Recurrent major depressive disorder, in full remission ([place])  Has been on paxil for several years.  Is doing well. Depression screen [facility] 2/9 07/12/2018 04/13/2018 01/31/2018  Decreased Interest 0 0 0  Down, Depressed, Hopeless 0 0 0  PHQ - 2 Score 0 0 0  Altered sleeping - - -  Tired, decreased energy - - -  Change in appetite - - -    Feeling bad or failure about yourself  - - -  Trouble concentrating - - -  Moving slowly or fidgety/restless - - -  Suicidal thoughts - - -  PHQ-9 Score - - -  Difficult doing work/chores - - -  Some recent data might be hidden     9. Hyperlipidemia with target LDL less than 100  Does not watch diet at all. Does no dedicated exercise.  10. BMI 32.0-32.9,adult  No recent weight changes  11. Smoker  Smokes over 1 1/2 packs a day    Outpatient Encounter Medications as of 07/12/2018  Medication Sig  . albuterol (PROAIR HFA) 108 (90 Base) MCG/ACT inhaler Inhale 2 puffs by mouth every 6 hours as needed for wheezing or shortness of breath  . ALPRAZolam (XANAX) 0.5 MG tablet Take 1 tablet (0.5 mg total) by mouth 3 (three) times daily as needed for anxiety.  . Aspirin-Acetaminophen-Caffeine (GOODYS EXTRA STRENGTH) 500-325-65 MG PACK Take 1 packet by mouth every 8 (eight) hours as needed (for pain.).  Marland Kitchen aspirin-sod bicarb-citric acid (ALKA-SELTZER) 325 MG TBEF tablet Take 325 mg by mouth every 6 (six) hours as needed (cold/congestion).  . calcium carbonate (OS-CAL) 600 MG TABS Take 600 mg by mouth daily.   . Cholecalciferol (VITAMIN D3) 1000 UNITS CAPS Take 3,000 mg by mouth daily.   Marland Kitchen dicyclomine (BENTYL) 10 MG capsule Take 1 capsule (10 mg total) by mouth 4 (four) times daily as needed for spasms.  . diphenhydrAMINE (BENADRYL) 25  MG tablet Take 25 mg by mouth 2 (two) times daily as needed. For Allergies  . fluticasone (FLONASE) 50 MCG/ACT nasal spray PLACE 2 SPRAYS INTO BOTH NOSTRILS DAILY.  Marland Kitchen HYDROcodone-acetaminophen (NORCO) 10-325 MG tablet Take 1 tablet by mouth every 8 (eight) hours as needed.  Marland Kitchen ipratropium (ATROVENT) 0.02 % nebulizer solution Take 2.5 mLs (0.5 mg total) by nebulization every 6 (six) hours as needed for wheezing or shortness of breath.  . levalbuterol (XOPENEX) 1.25 MG/0.5ML nebulizer solution Take 1.25 mg by nebulization every 6 (six) hours as needed for wheezing or  shortness of breath.  . lisinopril-hydrochlorothiazide (PRINZIDE,ZESTORETIC) 10-12.5 MG tablet Take 1 tablet by mouth daily.  . Multiple Vitamin (MULITIVITAMIN WITH MINERALS) TABS Take 1 tablet by mouth daily.  . Nebulizers (COMPRESSOR/NEBULIZER) MISC Please use with nebulizer solution every 6 hours as needed for shortness of breath or wheezing.  . pantoprazole (PROTONIX) 40 MG tablet Take 1 tablet (40 mg total) by mouth 2 (two) times daily.  Marland Kitchen PARoxetine (PAXIL) 20 MG tablet Take 1 tablet (20 mg total) by mouth daily.  . simvastatin (ZOCOR) 40 MG tablet Take 1 tablet (40 mg total) by mouth at bedtime.  . vitamin C (ASCORBIC ACID) 500 MG tablet Take 1,000 mg by mouth daily.       New complaints: Apartment that patient lives in has bed bugs in th ecomplex  Social history: Lives alone since her husband passed away.   Review of Systems  Constitutional: Negative for activity change and appetite change.  HENT: Negative.   Eyes: Negative for pain.  Respiratory: Negative for shortness of breath.   Cardiovascular: Negative for chest pain, palpitations and leg swelling.  Gastrointestinal: Negative for abdominal pain.  Endocrine: Negative for polydipsia.  Genitourinary: Negative.   Skin: Negative for rash.  Neurological: Negative for dizziness, weakness and headaches.  Hematological: Does not bruise/bleed easily.  Psychiatric/Behavioral: Negative.   All other systems reviewed and are negative.      Objective:   Physical Exam  Constitutional: She is oriented to person, place, and time. She appears well-developed and well-nourished. No distress.  HENT:  Head: Normocephalic.  Nose: Nose normal.  Mouth/Throat: Oropharynx is clear and moist.  Eyes: Pupils are equal, round, and reactive to light. EOM are normal.  Neck: Normal range of motion. Neck supple. No JVD present. Carotid bruit is not present.  Cardiovascular: Normal rate, regular rhythm, normal heart sounds and intact distal  pulses.  Pulmonary/Chest: Effort normal and breath sounds normal. No respiratory distress. She has no wheezes. She has no rales. She exhibits no tenderness.  Abdominal: Soft. Normal appearance, normal aorta and bowel sounds are normal. She exhibits no distension, no abdominal bruit, no pulsatile midline mass and no mass. There is no splenomegaly or hepatomegaly. There is no tenderness.  Musculoskeletal: Normal range of motion. She exhibits no edema.  Lymphadenopathy:    She has no cervical adenopathy.  Neurological: She is alert and oriented to person, place, and time. She has normal reflexes.  Skin: Skin is warm and dry.  Psychiatric: She has a normal mood and affect. Her behavior is normal. Judgment and thought content normal.  Nursing note and vitals reviewed.  BP 124/79   Pulse 72   Temp 98.8 F (37.1 C) (Oral)   Ht '5\' 5"'$  (1.651 m)   Wt 178 lb (80.7 kg)   BMI 29.62 kg/m       Assessment & Plan:  [name] comes in today with chief complaint of Medical  Management of Chronic Issues   Diagnosis and orders addressed:  1. Aortic atherosclerosis ([place])  2. Benign hypertension Low sodium diet - CMP14+EGFR - lisinopril-hydrochlorothiazide (PRINZIDE,ZESTORETIC) 10-12.5 MG tablet; Take 1 tablet by mouth daily.  Dispense: 90 tablet; Refill: 1  3. COPD (chronic obstructive pulmonary disease) with chronic bronchitis (HCC) Use oxygen wen needed  4. Gastroesophageal reflux disease without esophagitis Avoid spicy foods Do not eat 2 hours prior to bedtime - pantoprazole (PROTONIX) 40 MG tablet; Take 1 tablet (40 mg total) by mouth 2 (two) times daily.  Dispense: 180 tablet; Refill: 1  5. Anxiety Stress management  6. Chronic midline low back pain without sciatica Back stretches - HYDROcodone-acetaminophen (NORCO) 10-325 MG tablet; Take 1 tablet by mouth every 8 (eight) hours as needed.  Dispense: 90 tablet; Refill: 0 - HYDROcodone-acetaminophen (NORCO) 10-325 MG tablet;  Take 1 tablet by mouth every 8 (eight) hours as needed.  Dispense: 90 tablet; Refill: 0 - HYDROcodone-acetaminophen (NORCO) 10-325 MG tablet; Take 1 tablet by mouth every 8 (eight) hours as needed.  Dispense: 90 tablet; Refill: 0  7. Hypokalemia  8. Recurrent major depressive disorder, in full remission ([place]) Stress management - PARoxetine (PAXIL) 20 MG tablet; Take 1 tablet (20 mg total) by mouth daily.  Dispense: 90 tablet; Refill: 1  9. Hyperlipidemia with target LDL less than 100 Low fat diet - Lipid panel - simvastatin (ZOCOR) 40 MG tablet; Take 1 tablet (40 mg total) by mouth at bedtime.  Dispense: 90 tablet; Refill: 1  10. BMI 32.0-32.9,adult Discussed diet and exercise for person with BMI >25 Will recheck weight in 3-6 months  11. Smoker smoking cessation discussed   Labs pending Health Maintenance reviewed Diet and exercise encouraged  Follow up plan: 3 months   [name], FNP

## 2018-07-13 LAB — CMP14+EGFR
A/G RATIO: 2.2 (ref 1.2–2.2)
ALT: 13 IU/L (ref 0–32)
AST: 14 IU/L (ref 0–40)
Albumin: 4.2 g/dL (ref 3.5–4.8)
Alkaline Phosphatase: 76 IU/L (ref 39–117)
BUN/Creatinine Ratio: 12 (ref 12–28)
BUN: 7 mg/dL — ABNORMAL LOW (ref 8–27)
CHLORIDE: 98 mmol/L (ref 96–106)
CO2: 28 mmol/L (ref 20–29)
Calcium: 9.7 mg/dL (ref 8.7–10.3)
Creatinine, Ser: 0.57 mg/dL (ref 0.57–1.00)
GFR calc Af Amer: 109 mL/min/{1.73_m2} (ref >59)
GFR calc non Af Amer: 94 mL/min/{1.73_m2} (ref >59)
GLUCOSE: 90 mg/dL (ref 65–99)
Globulin, Total: 1.9 g/dL (ref 1.5–4.5)
POTASSIUM: 4 mmol/L (ref 3.5–5.2)
Sodium: 140 mmol/L (ref 134–144)
Total Protein: 6.1 g/dL (ref 6.0–8.5)

## 2018-07-13 LAB — LIPID PANEL
Chol/HDL Ratio: 1.9 ratio (ref 0.0–4.4)
Cholesterol, Total: 139 mg/dL (ref 100–199)
HDL: 75 mg/dL (ref >39)
LDL Calculated: 49 mg/dL (ref 0–99)
TRIGLYCERIDES: 75 mg/dL (ref 0–149)
VLDL Cholesterol Cal: 15 mg/dL (ref 5–40)

## 2018-07-23 DIAGNOSIS — J441 Chronic obstructive pulmonary disease with (acute) exacerbation: Secondary | ICD-10-CM | POA: Diagnosis not present

## 2018-08-23 DIAGNOSIS — J441 Chronic obstructive pulmonary disease with (acute) exacerbation: Secondary | ICD-10-CM | POA: Diagnosis not present

## 2018-09-06 ENCOUNTER — Other Ambulatory Visit: Payer: Self-pay | Admitting: *Deleted

## 2018-09-06 DIAGNOSIS — J209 Acute bronchitis, unspecified: Secondary | ICD-10-CM

## 2018-09-06 MED ORDER — ALBUTEROL SULFATE HFA 108 (90 BASE) MCG/ACT IN AERS: INHALATION_SPRAY | RESPIRATORY_TRACT | 5 refills | Status: DC

## 2018-09-07 ENCOUNTER — Telehealth: Payer: Self-pay | Admitting: Nurse Practitioner

## 2018-09-22 DIAGNOSIS — J441 Chronic obstructive pulmonary disease with (acute) exacerbation: Secondary | ICD-10-CM | POA: Diagnosis not present

## 2018-10-01 ENCOUNTER — Other Ambulatory Visit: Payer: Self-pay | Admitting: Nurse Practitioner

## 2018-10-01 ENCOUNTER — Telehealth: Payer: Self-pay | Admitting: Nurse Practitioner

## 2018-10-01 DIAGNOSIS — F419 Anxiety disorder, unspecified: Secondary | ICD-10-CM

## 2018-10-01 NOTE — Telephone Encounter (Signed)
Last seen 07/12/18  MMM

## 2018-10-01 NOTE — Telephone Encounter (Signed)
[id number] 12/30

## 2018-10-01 NOTE — Telephone Encounter (Signed)
Last picked up Rx 12/17, has appt with MMM 1/14, should have enough to make it to the appt. Needs to keep appointment.

## 2018-10-01 NOTE — Telephone Encounter (Signed)
Pt states she will be out of medication on 10/06/18. She has an appt with you 1/14, can you send in refill for her. She is aware you are out of office till 1/2.

## 2018-10-04 MED ORDER — ALPRAZOLAM 0.5 MG PO TABS: 0.5000 mg | ORAL_TABLET | Freq: Three times a day (TID) | ORAL | 2 refills | Status: DC | PRN

## 2018-10-04 NOTE — Telephone Encounter (Signed)
Xanax rx sent to pharamcy

## 2018-10-04 NOTE — Telephone Encounter (Signed)
Pt aware rx sent.  

## 2018-10-16 ENCOUNTER — Encounter: Payer: Self-pay | Admitting: Nurse Practitioner

## 2018-10-16 ENCOUNTER — Ambulatory Visit (INDEPENDENT_AMBULATORY_CARE_PROVIDER_SITE_OTHER): Payer: Medicare Other | Admitting: Nurse Practitioner

## 2018-10-16 VITALS — BP 124/70 | HR 85 | Temp 97.4°F | Ht 65.0 in | Wt 176.0 lb

## 2018-10-16 DIAGNOSIS — I10 Essential (primary) hypertension: Secondary | ICD-10-CM | POA: Diagnosis not present

## 2018-10-16 DIAGNOSIS — M545 Low back pain, unspecified: Secondary | ICD-10-CM

## 2018-10-16 DIAGNOSIS — F17200 Nicotine dependence, unspecified, uncomplicated: Secondary | ICD-10-CM

## 2018-10-16 DIAGNOSIS — J449 Chronic obstructive pulmonary disease, unspecified: Secondary | ICD-10-CM | POA: Diagnosis not present

## 2018-10-16 DIAGNOSIS — G8929 Other chronic pain: Secondary | ICD-10-CM

## 2018-10-16 DIAGNOSIS — F3342 Major depressive disorder, recurrent, in full remission: Secondary | ICD-10-CM

## 2018-10-16 DIAGNOSIS — F419 Anxiety disorder, unspecified: Secondary | ICD-10-CM

## 2018-10-16 DIAGNOSIS — K219 Gastro-esophageal reflux disease without esophagitis: Secondary | ICD-10-CM | POA: Diagnosis not present

## 2018-10-16 DIAGNOSIS — K529 Noninfective gastroenteritis and colitis, unspecified: Secondary | ICD-10-CM

## 2018-10-16 DIAGNOSIS — J4489 Other specified chronic obstructive pulmonary disease: Secondary | ICD-10-CM

## 2018-10-16 DIAGNOSIS — I700 Atherosclerosis of aorta: Secondary | ICD-10-CM

## 2018-10-16 DIAGNOSIS — E785 Hyperlipidemia, unspecified: Secondary | ICD-10-CM

## 2018-10-16 DIAGNOSIS — E876 Hypokalemia: Secondary | ICD-10-CM

## 2018-10-16 DIAGNOSIS — Z6832 Body mass index (BMI) 32.0-32.9, adult: Secondary | ICD-10-CM

## 2018-10-16 MED ORDER — SIMVASTATIN 40 MG PO TABS: 40.0000 mg | ORAL_TABLET | Freq: Every day | ORAL | 1 refills | Status: DC

## 2018-10-16 MED ORDER — HYDROCODONE-ACETAMINOPHEN 10-325 MG PO TABS: 1.0000 | ORAL_TABLET | Freq: Three times a day (TID) | ORAL | 0 refills | Status: DC | PRN

## 2018-10-16 MED ORDER — PANTOPRAZOLE SODIUM 40 MG PO TBEC: 40.0000 mg | DELAYED_RELEASE_TABLET | Freq: Two times a day (BID) | ORAL | 1 refills | Status: DC

## 2018-10-16 MED ORDER — DICYCLOMINE HCL 10 MG PO CAPS: 10.0000 mg | ORAL_CAPSULE | Freq: Four times a day (QID) | ORAL | 5 refills | Status: DC | PRN

## 2018-10-16 MED ORDER — PAROXETINE HCL 20 MG PO TABS: 20.0000 mg | ORAL_TABLET | Freq: Every day | ORAL | 1 refills | Status: DC

## 2018-10-16 MED ORDER — LISINOPRIL-HYDROCHLOROTHIAZIDE 10-12.5 MG PO TABS: 1.0000 | ORAL_TABLET | Freq: Every day | ORAL | 1 refills | Status: DC

## 2018-10-16 NOTE — Patient Instructions (Signed)
Steps to Quit Smoking    Smoking tobacco can be bad for your health. It can also affect almost every organ in your body. Smoking puts you and people around you at risk for many serious long-lasting (chronic) diseases. Quitting smoking is hard, but it is one of the best things that you can do for your health. It is never too late to quit.  What are the benefits of quitting smoking?  When you quit smoking, you lower your risk for getting serious diseases and conditions. They can include:  · Lung cancer or lung disease.  · Heart disease.  · Stroke.  · Heart attack.  · Not being able to have children (infertility).  · Weak bones (osteoporosis) and broken bones (fractures).  If you have coughing, wheezing, and shortness of breath, those symptoms may get better when you quit. You may also get sick less often. If you are pregnant, quitting smoking can help to lower your chances of having a baby of low birth weight.  What can I do to help me quit smoking?  Talk with your doctor about what can help you quit smoking. Some things you can do (strategies) include:  · Quitting smoking totally, instead of slowly cutting back how much you smoke over a period of time.  · Going to in-person counseling. You are more likely to quit if you go to many counseling sessions.  · Using resources and support systems, such as:  ? Online chats with a counselor.  ? Phone quitlines.  ? Printed self-help materials.  ? Support groups or group counseling.  ? Text messaging programs.  ? Mobile phone apps or applications.  · Taking medicines. Some of these medicines may have nicotine in them. If you are pregnant or breastfeeding, do not take any medicines to quit smoking unless your doctor says it is okay. Talk with your doctor about counseling or other things that can help you.  Talk with your doctor about using more than one strategy at the same time, such as taking medicines while you are also going to in-person counseling. This can help make  quitting easier.  What things can I do to make it easier to quit?  Quitting smoking might feel very hard at first, but there is a lot that you can do to make it easier. Take these steps:  · Talk to your family and friends. Ask them to support and encourage you.  · Call phone quitlines, reach out to support groups, or work with a counselor.  · Ask people who smoke to not smoke around you.  · Avoid places that make you want (trigger) to smoke, such as:  ? Bars.  ? Parties.  ? Smoke-break areas at work.  · Spend time with people who do not smoke.  · Lower the stress in your life. Stress can make you want to smoke. Try these things to help your stress:  ? Getting regular exercise.  ? Deep-breathing exercises.  ? Yoga.  ? Meditating.  ? Doing a body scan. To do this, close your eyes, focus on one area of your body at a time from head to toe, and notice which parts of your body are tense. Try to relax the muscles in those areas.  · Download or buy apps on your mobile phone or tablet that can help you stick to your quit plan. There are many free apps, such as QuitGuide from the CDC (Centers for Disease Control and Prevention). You can find more   support from smokefree.gov and other websites.  This information is not intended to replace advice given to you by your health care provider. Make sure you discuss any questions you have with your health care provider.  Document Released: 07/16/2009 Document Revised: 05/17/2016 Document Reviewed: 02/03/2015  Elsevier Interactive Patient Education © 2019 Elsevier Inc.

## 2018-10-16 NOTE — Progress Notes (Addendum)
Subjective:    Patient ID: [name], female    DOB: 18-Sep-1946, 73 y.o.   MRN: [id number]  HPI  Chief Complaint: medical management of chronic isues  HPI:  1. Benign hypertension  No c/o chest pain, sob or headache. Does not check blood pressure at home. BP Readings from Last 3 Encounters:  07/12/18 124/79  04/13/18 (!) 142/76  01/31/18 133/84     2. Aortic atherosclerosis ([place])  Seen on chest xray- currently just monitoring  3. COPD (chronic obstructive pulmonary disease) with chronic bronchitis (HCC)  Uses atrovent neb when she is sick. Patient is on oxygen at home at 2L via nasal cannulla. She usually only uses it at night and in evenings  4. Gastroesophageal reflux disease without esophagitis  Takes daily protonix daily- works well as long as she does not eat a lot of spicy foods  5. Smoker  Just does not have the desire to quit smoking  6. Hypokalemia  No c/o lower ext cramping  7. Hyperlipidemia with target LDL less than 100  Does not watch diet at all and does no exercise  8. Recurrent major depressive disorder, in full remission ([place])  sheis on paxil and has ben for years. Works well for her. Depression screen [facility] 2/9 07/12/2018 04/13/2018 01/31/2018  Decreased Interest 0 0 0  Down, Depressed, Hopeless 0 0 0  PHQ - 2 Score 0 0 0  Altered sleeping - - -  Tired, decreased energy - - -  Change in appetite - - -  Feeling bad or failure about yourself  - - -  Trouble concentrating - - -  Moving slowly or fidgety/restless - - -  Suicidal thoughts - - -  PHQ-9 Score - - -  Difficult doing work/chores - - -  Some recent data might be hidden     9. Anxiety  She has been on xanax for many years she takes 0.5mg  3x a day. I have tried weaning her down because she is on so much pain meds but have not been successful  10. Chronic midline low back pain without sciatica  Pain assessment: Cause of pain- DDD Pain location- lower back Pain on scale of 1-10- 6/10  currently Frequency- daily What increases pain-lots of moving around and walking or standing What makes pain Better-pain meds help Effects on ADL - she is still ale to do what she needs to do Any change in general medical condition-none  Current medications- lortab  10/325 3x a day Effectiveness of current meds-helps make pain tolerable but does not completely relieve pain Adverse reactions form pain meds-none Morphine equivalent- >30  Pill count performed-No Urine drug screen- No Was the [place] reviewed- yes  If yes were their any concerning findings? - none  Pain contract signed on:   11. BMI 32.0-32.9,adult  Weight is down 2 lbs from previous.    Outpatient Encounter Medications as of 10/16/2018  Medication Sig  . albuterol (PROAIR HFA) 108 (90 Base) MCG/ACT inhaler Inhale 2 puffs by mouth every 6 hours as needed for wheezing or shortness of breath  . ALPRAZolam (XANAX) 0.5 MG tablet Take 1 tablet (0.5 mg total) by mouth 3 (three) times daily as needed for anxiety.  . Aspirin-Acetaminophen-Caffeine (GOODYS EXTRA STRENGTH) 500-325-65 MG PACK Take 1 packet by mouth every 8 (eight) hours as needed (for pain.).  Marland Kitchen aspirin-sod bicarb-citric acid (ALKA-SELTZER) 325 MG TBEF tablet Take 325 mg by mouth every 6 (six) hours as needed (cold/congestion).  Marland Kitchen  calcium carbonate (OS-CAL) 600 MG TABS Take 600 mg by mouth daily.   . Cholecalciferol (VITAMIN D3) 1000 UNITS CAPS Take 3,000 mg by mouth daily.   Marland Kitchen dicyclomine (BENTYL) 10 MG capsule Take 1 capsule (10 mg total) by mouth 4 (four) times daily as needed for spasms.  . diphenhydrAMINE (BENADRYL) 25 MG tablet Take 25 mg by mouth 2 (two) times daily as needed. For Allergies  . fluticasone (FLONASE) 50 MCG/ACT nasal spray PLACE 2 SPRAYS INTO BOTH NOSTRILS DAILY.  Marland Kitchen HYDROcodone-acetaminophen (NORCO) 10-325 MG tablet Take 1 tablet by mouth every 8 (eight) hours as needed.  Marland Kitchen HYDROcodone-acetaminophen (NORCO) 10-325 MG tablet Take 1 tablet by  mouth every 8 (eight) hours as needed.  Marland Kitchen HYDROcodone-acetaminophen (NORCO) 10-325 MG tablet Take 1 tablet by mouth every 8 (eight) hours as needed.  Marland Kitchen ipratropium (ATROVENT) 0.02 % nebulizer solution Take 2.5 mLs (0.5 mg total) by nebulization every 6 (six) hours as needed for wheezing or shortness of breath.  . levalbuterol (XOPENEX) 1.25 MG/0.5ML nebulizer solution Take 1.25 mg by nebulization every 6 (six) hours as needed for wheezing or shortness of breath.  . lisinopril-hydrochlorothiazide (PRINZIDE,ZESTORETIC) 10-12.5 MG tablet Take 1 tablet by mouth daily.  . Multiple Vitamin (MULITIVITAMIN WITH MINERALS) TABS Take 1 tablet by mouth daily.  . Nebulizers (COMPRESSOR/NEBULIZER) MISC Please use with nebulizer solution every 6 hours as needed for shortness of breath or wheezing.  . pantoprazole (PROTONIX) 40 MG tablet Take 1 tablet (40 mg total) by mouth 2 (two) times daily.  Marland Kitchen PARoxetine (PAXIL) 20 MG tablet Take 1 tablet (20 mg total) by mouth daily.  . simvastatin (ZOCOR) 40 MG tablet Take 1 tablet (40 mg total) by mouth at bedtime.  . vitamin C (ASCORBIC ACID) 500 MG tablet Take 1,000 mg by mouth daily.        New complaints: None today  Social history: Lives by herself since husband died    Review of Systems  Constitutional: Negative for activity change and appetite change.  HENT: Negative.   Eyes: Negative for pain.  Respiratory: Negative for shortness of breath.   Cardiovascular: Negative for chest pain, palpitations and leg swelling.  Gastrointestinal: Negative for abdominal pain.  Endocrine: Negative for polydipsia.  Genitourinary: Negative.   Skin: Negative for rash.  Neurological: Negative for dizziness, weakness and headaches.  Hematological: Does not bruise/bleed easily.  Psychiatric/Behavioral: Negative.   All other systems reviewed and are negative.      Objective:   Physical Exam Vitals signs and nursing note reviewed.  Constitutional:      General:  She is not in acute distress.    Appearance: Normal appearance. She is well-developed.  HENT:     Head: Normocephalic.     Nose: Nose normal.  Eyes:     Pupils: Pupils are equal, round, and reactive to light.  Neck:     Musculoskeletal: Normal range of motion and neck supple.     Vascular: No carotid bruit or JVD.  Cardiovascular:     Rate and Rhythm: Normal rate and regular rhythm.     Heart sounds: Normal heart sounds.  Pulmonary:     Effort: Pulmonary effort is normal. No respiratory distress.     Breath sounds: Normal breath sounds. No wheezing or rales.  Chest:     Chest wall: No tenderness.  Abdominal:     General: Bowel sounds are normal. There is no distension or abdominal bruit.     Palpations: Abdomen is soft. There is no  hepatomegaly, splenomegaly, mass or pulsatile mass.     Tenderness: There is no abdominal tenderness.  Musculoskeletal: Normal range of motion.  Lymphadenopathy:     Cervical: No cervical adenopathy.  Skin:    General: Skin is warm and dry.  Neurological:     Mental Status: She is alert and oriented to person, place, and time.     Deep Tendon Reflexes: Reflexes are normal and symmetric.  Psychiatric:        Mood and Affect: Mood normal.        Behavior: Behavior normal.        Thought Content: Thought content normal.        Judgment: Judgment normal.    BP 124/70   Pulse 85   Temp (!) 97.4 F (36.3 C) (Oral)   Ht 5\' 5"  (1.651 m)   Wt 176 lb (79.8 kg)   BMI 29.29 kg/m        Assessment & Plan:  KATHERLEEN FOLKES comes in today with chief complaint of Medical Management of Chronic Issues   Diagnosis and orders addressed:  1. Benign hypertension Low sodium diet - lisinopril-hydrochlorothiazide (PRINZIDE,ZESTORETIC) 10-12.5 MG tablet; Take 1 tablet by mouth daily.  Dispense: 90 tablet; Refill: 1  2. Aortic atherosclerosis ([place])  3. COPD (chronic obstructive pulmonary disease) with chronic bronchitis (HCC) Continue nebs as  needed Continue o2 via nasal cannulla  4. Gastroesophageal reflux disease without esophagitis Avoid spicy foods Do not eat 2 hours prior to bedtime - pantoprazole (PROTONIX) 40 MG tablet; Take 1 tablet (40 mg total) by mouth 2 (two) times daily.  Dispense: 180 tablet; Refill: 1  5. Smoker Encouraged to stop smoking  6. Hypokalemia  7. Hyperlipidemia with target LDL less than 100 Low fat diet - simvastatin (ZOCOR) 40 MG tablet; Take 1 tablet (40 mg total) by mouth at bedtime.  Dispense: 90 tablet; Refill: 1  8. Recurrent major depressive disorder, in full remission ([place]) Stress management - PARoxetine (PAXIL) 20 MG tablet; Take 1 tablet (20 mg total) by mouth daily.  Dispense: 90 tablet; Refill: 1  9. Anxiety  10. Chronic midline low back pain without sciatica Back stretches - HYDROcodone-acetaminophen (NORCO) 10-325 MG tablet; Take 1 tablet by mouth every 8 (eight) hours as needed for up to 30 days.  Dispense: 90 tablet; Refill: 0 - HYDROcodone-acetaminophen (NORCO) 10-325 MG tablet; Take 1 tablet by mouth every 8 (eight) hours as needed for up to 30 days.  Dispense: 90 tablet; Refill: 0 - HYDROcodone-acetaminophen (NORCO) 10-325 MG tablet; Take 1 tablet by mouth every 8 (eight) hours as needed for up to 30 days.  Dispense: 90 tablet; Refill: 0  11. BMI 32.0-32.9,adult Discussed diet and exercise for person with BMI >25 Will recheck weight in 3-6 months  12. Chronic diarrhea - dicyclomine (BENTYL) 10 MG capsule; Take 1 capsule (10 mg total) by mouth 4 (four) times daily as needed for spasms.  Dispense: 120 capsule; Refill: 5   Labs pending Health Maintenance reviewed Diet and exercise encouraged  Follow up plan: 3 months   [name], FNP

## 2018-10-16 NOTE — Progress Notes (Signed)
Chief Complaint: medical management of chronic isues  HPI:  1. Benign hypertension  No c/o chest pain, sob or headache. Does not check blood pressure at home. BP Readings from Last 3 Encounters:  07/12/18 124/79  04/13/18 (!) 142/76  01/31/18 133/84     2. Aortic atherosclerosis ([place])  Seen on chest xray- currently just monitoring  3. COPD (chronic obstructive pulmonary disease) with chronic bronchitis (HCC)  Uses atrovent neb when she is sick.  4. Gastroesophageal reflux disease without esophagitis  Takes daily protonix daily- works well as long as she does not eat a lot of spicy foods  5. Smoker  Just does not have the desire to quit smoking  6. Hypokalemia  No c/o lower ext cramping  7. Hyperlipidemia with target LDL less than 100  Does not watch diet at all and does no exercise  8. Recurrent major depressive disorder, in full remission ([place])  sheis on paxil and has ben for years. Works well for her.  9. Anxiety  She has been on xanax for many years she takes 0.5mg  3x a day. I have tried weaning her down because she is on so much pain meds but have not been successful  10. Chronic midline low back pain without sciatica  Pain assessment: Cause of pain- DDD Pain location- lower back Pain on scale of 1-10- 6/10 currently Frequency- daily What increases pain-lots of moving around and walking or standing What makes pain Better-pain meds help Effects on ADL - she is still ale to do what she needs to do Any change in general medical condition-none  Current medications- lortab  10/325 3x a day Effectiveness of current meds-helps make pain tolerable but does not completely relieve pain Adverse reactions form pain meds-none Morphine equivalent- >30  Pill count performed-No Urine drug screen- No Was the [place] reviewed- yes  If yes were their any concerning findings? - none  Pain contract signed on:   11. BMI 32.0-32.9,adult     Outpatient Encounter Medications as of  10/16/2018  Medication Sig  . albuterol (PROAIR HFA) 108 (90 Base) MCG/ACT inhaler Inhale 2 puffs by mouth every 6 hours as needed for wheezing or shortness of breath  . ALPRAZolam (XANAX) 0.5 MG tablet Take 1 tablet (0.5 mg total) by mouth 3 (three) times daily as needed for anxiety.  . Aspirin-Acetaminophen-Caffeine (GOODYS EXTRA STRENGTH) 500-325-65 MG PACK Take 1 packet by mouth every 8 (eight) hours as needed (for pain.).  Marland Kitchen aspirin-sod bicarb-citric acid (ALKA-SELTZER) 325 MG TBEF tablet Take 325 mg by mouth every 6 (six) hours as needed (cold/congestion).  . calcium carbonate (OS-CAL) 600 MG TABS Take 600 mg by mouth daily.   . Cholecalciferol (VITAMIN D3) 1000 UNITS CAPS Take 3,000 mg by mouth daily.   Marland Kitchen dicyclomine (BENTYL) 10 MG capsule Take 1 capsule (10 mg total) by mouth 4 (four) times daily as needed for spasms.  . diphenhydrAMINE (BENADRYL) 25 MG tablet Take 25 mg by mouth 2 (two) times daily as needed. For Allergies  . fluticasone (FLONASE) 50 MCG/ACT nasal spray PLACE 2 SPRAYS INTO BOTH NOSTRILS DAILY.  Marland Kitchen HYDROcodone-acetaminophen (NORCO) 10-325 MG tablet Take 1 tablet by mouth every 8 (eight) hours as needed.  Marland Kitchen HYDROcodone-acetaminophen (NORCO) 10-325 MG tablet Take 1 tablet by mouth every 8 (eight) hours as needed.  Marland Kitchen HYDROcodone-acetaminophen (NORCO) 10-325 MG tablet Take 1 tablet by mouth every 8 (eight) hours as needed.  Marland Kitchen ipratropium (ATROVENT) 0.02 % nebulizer solution Take 2.5 mLs (0.5 mg total) by  nebulization every 6 (six) hours as needed for wheezing or shortness of breath.  . levalbuterol (XOPENEX) 1.25 MG/0.5ML nebulizer solution Take 1.25 mg by nebulization every 6 (six) hours as needed for wheezing or shortness of breath.  . lisinopril-hydrochlorothiazide (PRINZIDE,ZESTORETIC) 10-12.5 MG tablet Take 1 tablet by mouth daily.  . Multiple Vitamin (MULITIVITAMIN WITH MINERALS) TABS Take 1 tablet by mouth daily.  . Nebulizers (COMPRESSOR/NEBULIZER) MISC Please use with  nebulizer solution every 6 hours as needed for shortness of breath or wheezing.  . pantoprazole (PROTONIX) 40 MG tablet Take 1 tablet (40 mg total) by mouth 2 (two) times daily.  Marland Kitchen PARoxetine (PAXIL) 20 MG tablet Take 1 tablet (20 mg total) by mouth daily.  . simvastatin (ZOCOR) 40 MG tablet Take 1 tablet (40 mg total) by mouth at bedtime.  . vitamin C (ASCORBIC ACID) 500 MG tablet Take 1,000 mg by mouth daily.        New complaints: None   Social history:

## 2018-10-17 LAB — LIPID PANEL
CHOL/HDL RATIO: 2.2 ratio (ref 0.0–4.4)
Cholesterol, Total: 146 mg/dL (ref 100–199)
HDL: 67 mg/dL (ref >39)
LDL Calculated: 58 mg/dL (ref 0–99)
Triglycerides: 104 mg/dL (ref 0–149)
VLDL Cholesterol Cal: 21 mg/dL (ref 5–40)

## 2018-10-17 LAB — CMP14+EGFR
ALT: 10 IU/L (ref 0–32)
AST: 14 IU/L (ref 0–40)
Albumin/Globulin Ratio: 1.9 (ref 1.2–2.2)
Albumin: 4.2 g/dL (ref 3.5–4.8)
Alkaline Phosphatase: 94 IU/L (ref 39–117)
BUN/Creatinine Ratio: 9 — ABNORMAL LOW (ref 12–28)
BUN: 5 mg/dL — ABNORMAL LOW (ref 8–27)
Bilirubin Total: 0.4 mg/dL (ref 0.0–1.2)
CO2: 27 mmol/L (ref 20–29)
Calcium: 9.9 mg/dL (ref 8.7–10.3)
Chloride: 95 mmol/L — ABNORMAL LOW (ref 96–106)
Creatinine, Ser: 0.58 mg/dL (ref 0.57–1.00)
GFR calc Af Amer: 107 mL/min/{1.73_m2} (ref >59)
GFR, EST NON AFRICAN AMERICAN: 93 mL/min/{1.73_m2} (ref >59)
Globulin, Total: 2.2 g/dL (ref 1.5–4.5)
Glucose: 96 mg/dL (ref 65–99)
POTASSIUM: 3.8 mmol/L (ref 3.5–5.2)
Sodium: 135 mmol/L (ref 134–144)
Total Protein: 6.4 g/dL (ref 6.0–8.5)

## 2018-10-18 ENCOUNTER — Telehealth: Payer: Self-pay | Admitting: Nurse Practitioner

## 2018-10-18 DIAGNOSIS — G8929 Other chronic pain: Secondary | ICD-10-CM

## 2018-10-18 DIAGNOSIS — M545 Low back pain: Principal | ICD-10-CM

## 2018-10-18 NOTE — Telephone Encounter (Signed)
Please address

## 2018-10-18 NOTE — Telephone Encounter (Signed)
Patient walked into night clinic to see if this had been done yet she is wanting this done ASAP she was told mmm was out of the office she would have to wait till tomorrow when provider was back in. Patient verbalized understanding.

## 2018-10-19 MED ORDER — HYDROCODONE-ACETAMINOPHEN 10-325 MG PO TABS: 1.0000 | ORAL_TABLET | Freq: Three times a day (TID) | ORAL | 0 refills | Status: DC | PRN

## 2018-10-19 NOTE — Telephone Encounter (Signed)
Please call [facility] about this

## 2018-10-19 NOTE — Telephone Encounter (Signed)
Spoke with pharmacist and he said the manufacture is out of Norco 10 and not sure when they will have it back in.

## 2018-10-23 DIAGNOSIS — J441 Chronic obstructive pulmonary disease with (acute) exacerbation: Secondary | ICD-10-CM | POA: Diagnosis not present

## 2018-11-23 DIAGNOSIS — J441 Chronic obstructive pulmonary disease with (acute) exacerbation: Secondary | ICD-10-CM | POA: Diagnosis not present

## 2018-12-21 ENCOUNTER — Other Ambulatory Visit: Payer: Self-pay | Admitting: Nurse Practitioner

## 2018-12-21 DIAGNOSIS — F419 Anxiety disorder, unspecified: Secondary | ICD-10-CM

## 2018-12-22 DIAGNOSIS — J441 Chronic obstructive pulmonary disease with (acute) exacerbation: Secondary | ICD-10-CM | POA: Diagnosis not present

## 2019-01-15 ENCOUNTER — Other Ambulatory Visit: Payer: Self-pay

## 2019-01-15 ENCOUNTER — Encounter: Payer: Self-pay | Admitting: Nurse Practitioner

## 2019-01-15 ENCOUNTER — Ambulatory Visit (INDEPENDENT_AMBULATORY_CARE_PROVIDER_SITE_OTHER): Payer: Medicare Other | Admitting: Nurse Practitioner

## 2019-01-15 DIAGNOSIS — Z6832 Body mass index (BMI) 32.0-32.9, adult: Secondary | ICD-10-CM

## 2019-01-15 DIAGNOSIS — M545 Low back pain, unspecified: Secondary | ICD-10-CM

## 2019-01-15 DIAGNOSIS — E785 Hyperlipidemia, unspecified: Secondary | ICD-10-CM | POA: Diagnosis not present

## 2019-01-15 DIAGNOSIS — K219 Gastro-esophageal reflux disease without esophagitis: Secondary | ICD-10-CM | POA: Diagnosis not present

## 2019-01-15 DIAGNOSIS — I700 Atherosclerosis of aorta: Secondary | ICD-10-CM | POA: Diagnosis not present

## 2019-01-15 DIAGNOSIS — E876 Hypokalemia: Secondary | ICD-10-CM

## 2019-01-15 DIAGNOSIS — G8929 Other chronic pain: Secondary | ICD-10-CM

## 2019-01-15 DIAGNOSIS — F419 Anxiety disorder, unspecified: Secondary | ICD-10-CM

## 2019-01-15 DIAGNOSIS — I10 Essential (primary) hypertension: Secondary | ICD-10-CM | POA: Diagnosis not present

## 2019-01-15 DIAGNOSIS — F17200 Nicotine dependence, unspecified, uncomplicated: Secondary | ICD-10-CM

## 2019-01-15 DIAGNOSIS — F3342 Major depressive disorder, recurrent, in full remission: Secondary | ICD-10-CM

## 2019-01-15 MED ORDER — HYDROCODONE-ACETAMINOPHEN 10-325 MG PO TABS: 1.0000 | ORAL_TABLET | Freq: Three times a day (TID) | ORAL | 0 refills | Status: DC | PRN

## 2019-01-15 NOTE — Progress Notes (Signed)
Patient ID: MEHA [name], female   DOB: Oct 09, 1945, 73 y.o.   MRN: [id number]    Virtual Visit via telephone Note  I connected with [name] on 01/15/19 at 2:50 PM by telephone and verified that I am speaking with the correct person using two identifiers. [name] is currently located at home and non one is currently with her during visit. The provider, [name], FNP is located in their home office at time of visit.  I discussed the limitations, risks, security and privacy concerns of performing an evaluation and management service by telephone and the availability of in person appointments. I also discussed with the patient that there may be a patient responsible charge related to this service. The patient expressed understanding and agreed to proceed.   History and Present Illness:   Chief Complaint: Medical Management of Chronic Issues    HPI:  1. Benign hypertension no c/o chest pain, sob or headache. Does not check blood pressure at home. BP Readings from Last 3 Encounters:  10/16/18 124/70  07/12/18 124/79  04/13/18 (!) 142/76     2. Aortic atherosclerosis ([place]) Has not seen cardiology in several years. Does not want to go.  3. Hyperlipidemia with target LDL less than 100 Does not watch diet and does very little exercise  4. Hypokalemia No c/o of lower ext cramoing  5. Gastroesophageal reflux disease without esophagitis Is on protonix daily and is doing well  6. Anxiety Is on xanax TID. We have tried to wean her down but have not been able to.  7. Recurrent major depressive disorder, in full remission [facility]) Is on paxil denies depression Depression screen [facility] 2/9 01/15/2019 07/12/2018 04/13/2018  Decreased Interest 1 0 0  Down, Depressed, Hopeless 1 0 0  PHQ - 2 Score 2 0 0  Altered sleeping - - -  Tired, decreased energy - - -  Change in appetite - - -  Feeling bad or failure about yourself  - - -  Trouble concentrating - -  -  Moving slowly or fidgety/restless - - -  Suicidal thoughts - - -  PHQ-9 Score - - -  Difficult doing work/chores - - -  Some recent data might be hidden     8. Smoker Stll smoking more then a pack a day. Has gotten worse since she is cooped up in the house  9. Chronic midline low back pain without sciatica Pain assessment: Cause of pain- DDD Pain location- lower lumbar Pain on scale of 1-10- 2/10 currently Frequency- daily What increases pain-to much activity What makes pain Better-rest Effects on ADL - is able to get done what she needs to do Any change in general medical condition-none  Current opioids rx- norco 10/325 TID # meds rx- 90 Effectiveness of current meds-brings pain doen to 2-4/10 Adverse reactions form pain meds-none Morphine equivalent- 30mg  MEDD  Pill count performed-No Last drug screen - need to do at next visit ( high risk q26m, moderate risk q72m, low risk yearly ) Urine drug screen today- No Was the [place] reviewed- yes  If yes were their any concerning findings? - none   10. BMI 32.0-32.9,adult No recent weight changes    Outpatient Encounter Medications as of 01/15/2019  Medication Sig  . albuterol (PROAIR HFA) 108 (90 Base) MCG/ACT inhaler Inhale 2 puffs by mouth every 6 hours as needed for wheezing or shortness of breath  . ALPRAZolam (XANAX) 0.5 MG tablet TAKE 1 TABLET (0.5 MG TOTAL)  BY MOUTH 3 (THREE) TIMES DAILY AS NEEDED FOR ANXIETY.  . Aspirin-Acetaminophen-Caffeine (GOODYS EXTRA STRENGTH) 500-325-65 MG PACK Take 1 packet by mouth every 8 (eight) hours as needed (for pain.).  Marland Kitchen aspirin-sod bicarb-citric acid (ALKA-SELTZER) 325 MG TBEF tablet Take 325 mg by mouth every 6 (six) hours as needed (cold/congestion).  . calcium carbonate (OS-CAL) 600 MG TABS Take 600 mg by mouth daily.   . Cholecalciferol (VITAMIN D3) 1000 UNITS CAPS Take 3,000 mg by mouth daily.   Marland Kitchen dicyclomine (BENTYL) 10 MG capsule Take 1 capsule (10 mg total) by mouth 4  (four) times daily as needed for spasms.  . diphenhydrAMINE (BENADRYL) 25 MG tablet Take 25 mg by mouth 2 (two) times daily as needed. For Allergies  . fluticasone (FLONASE) 50 MCG/ACT nasal spray PLACE 2 SPRAYS INTO BOTH NOSTRILS DAILY.  Marland Kitchen HYDROcodone-acetaminophen (NORCO) 10-325 MG tablet Take 1 tablet by mouth every 8 (eight) hours as needed for up to 30 days.  Marland Kitchen HYDROcodone-acetaminophen (NORCO) 10-325 MG tablet Take 1 tablet by mouth every 8 (eight) hours as needed for up to 30 days.  Marland Kitchen HYDROcodone-acetaminophen (NORCO) 10-325 MG tablet Take 1 tablet by mouth every 8 (eight) hours as needed for up to 30 days.  Marland Kitchen ipratropium (ATROVENT) 0.02 % nebulizer solution Take 2.5 mLs (0.5 mg total) by nebulization every 6 (six) hours as needed for wheezing or shortness of breath.  . levalbuterol (XOPENEX) 1.25 MG/0.5ML nebulizer solution Take 1.25 mg by nebulization every 6 (six) hours as needed for wheezing or shortness of breath.  . lisinopril-hydrochlorothiazide (PRINZIDE,ZESTORETIC) 10-12.5 MG tablet Take 1 tablet by mouth daily.  . Multiple Vitamin (MULITIVITAMIN WITH MINERALS) TABS Take 1 tablet by mouth daily.  . Nebulizers (COMPRESSOR/NEBULIZER) MISC Please use with nebulizer solution every 6 hours as needed for shortness of breath or wheezing.  . pantoprazole (PROTONIX) 40 MG tablet Take 1 tablet (40 mg total) by mouth 2 (two) times daily.  Marland Kitchen PARoxetine (PAXIL) 20 MG tablet Take 1 tablet (20 mg total) by mouth daily.  . simvastatin (ZOCOR) 40 MG tablet Take 1 tablet (40 mg total) by mouth at bedtime.  . vitamin C (ASCORBIC ACID) 500 MG tablet Take 1,000 mg by mouth daily.       New complaints: None today  Social history: Lives by herself       Review of Systems  Constitutional: Negative.  Negative for diaphoresis and weight loss.  Eyes: Negative for blurred vision, double vision and pain.  Respiratory: Negative for shortness of breath.   Cardiovascular: Negative for chest pain,  palpitations, orthopnea and leg swelling.  Gastrointestinal: Negative for abdominal pain.  Musculoskeletal: Positive for back pain.  Skin: Negative for rash.  Neurological: Negative for dizziness, sensory change, loss of consciousness, weakness and headaches.  Endo/Heme/Allergies: Negative for polydipsia. Does not bruise/bleed easily.  Psychiatric/Behavioral: Negative for memory loss. The patient does not have insomnia.   All other systems reviewed and are negative.    Observations/Objective: Alert and oriented- answers all questions apporpriately No distress noted  Assessment and Plan: BRICE POTTEIGER comes in today with chief complaint of Medical Management of Chronic Issues   Diagnosis and orders addressed:  1. Benign hypertension Low sodium diet  2. Aortic atherosclerosis ([place])  3. Hyperlipidemia with target LDL less than 100 Low fat diet  4. Hypokalemia  5. Gastroesophageal reflux disease without esophagitis Avoid spicy foods Do not eat 2 hours prior to bedtime  6. Anxiety Stress management  7. Recurrent major depressive disorder,  in full remission ([place])  8. Smoker Try to decrease smoking  9. Chronic midline low back pain without sciatica Back stretches - HYDROcodone-acetaminophen (NORCO) 10-325 MG tablet; Take 1 tablet by mouth every 8 (eight) hours as needed for up to 30 days.  Dispense: 90 tablet; Refill: 0 - HYDROcodone-acetaminophen (NORCO) 10-325 MG tablet; Take 1 tablet by mouth every 8 (eight) hours as needed for up to 30 days.  Dispense: 90 tablet; Refill: 0 - HYDROcodone-acetaminophen (NORCO) 10-325 MG tablet; Take 1 tablet by mouth every 8 (eight) hours as needed for up to 30 days.  Dispense: 90 tablet; Refill: 0  10. BMI 32.0-32.9,adult Discussed diet and exercise for person with BMI >25 Will recheck weight in 3-6 months   Previous labs reviewed Health Maintenance reviewed Diet and exercise encouraged  Follow up plan: 3 months     I  discussed the assessment and treatment plan with the patient. The patient was provided an opportunity to ask questions and all were answered. The patient agreed with the plan and demonstrated an understanding of the instructions.   The patient was advised to call back or seek an in-person evaluation if the symptoms worsen or if the condition fails to improve as anticipated.  The above assessment and management plan was discussed with the patient. The patient verbalized understanding of and has agreed to the management plan. Patient is aware to call the clinic if symptoms persist or worsen. Patient is aware when to return to the clinic for a follow-up visit. Patient educated on when it is appropriate to go to the emergency department.    I provided 20 minutes of non-face-to-face time during this encounter.    [name], FNP

## 2019-01-22 DIAGNOSIS — J441 Chronic obstructive pulmonary disease with (acute) exacerbation: Secondary | ICD-10-CM | POA: Diagnosis not present

## 2019-02-21 DIAGNOSIS — J441 Chronic obstructive pulmonary disease with (acute) exacerbation: Secondary | ICD-10-CM | POA: Diagnosis not present

## 2019-03-08 ENCOUNTER — Other Ambulatory Visit: Payer: Self-pay | Admitting: Nurse Practitioner

## 2019-03-08 DIAGNOSIS — F419 Anxiety disorder, unspecified: Secondary | ICD-10-CM

## 2019-03-12 ENCOUNTER — Other Ambulatory Visit: Payer: Self-pay | Admitting: Nurse Practitioner

## 2019-03-12 DIAGNOSIS — F419 Anxiety disorder, unspecified: Secondary | ICD-10-CM

## 2019-03-22 ENCOUNTER — Other Ambulatory Visit: Payer: Self-pay | Admitting: Nurse Practitioner

## 2019-03-22 DIAGNOSIS — J209 Acute bronchitis, unspecified: Secondary | ICD-10-CM

## 2019-03-24 DIAGNOSIS — J441 Chronic obstructive pulmonary disease with (acute) exacerbation: Secondary | ICD-10-CM | POA: Diagnosis not present

## 2019-04-11 ENCOUNTER — Telehealth: Payer: Self-pay | Admitting: Nurse Practitioner

## 2019-04-15 ENCOUNTER — Ambulatory Visit (INDEPENDENT_AMBULATORY_CARE_PROVIDER_SITE_OTHER): Payer: Medicare Other | Admitting: Nurse Practitioner

## 2019-04-15 ENCOUNTER — Encounter: Payer: Self-pay | Admitting: Nurse Practitioner

## 2019-04-15 DIAGNOSIS — G8929 Other chronic pain: Secondary | ICD-10-CM

## 2019-04-15 DIAGNOSIS — F419 Anxiety disorder, unspecified: Secondary | ICD-10-CM

## 2019-04-15 DIAGNOSIS — K219 Gastro-esophageal reflux disease without esophagitis: Secondary | ICD-10-CM | POA: Diagnosis not present

## 2019-04-15 DIAGNOSIS — F17200 Nicotine dependence, unspecified, uncomplicated: Secondary | ICD-10-CM

## 2019-04-15 DIAGNOSIS — I700 Atherosclerosis of aorta: Secondary | ICD-10-CM

## 2019-04-15 DIAGNOSIS — E785 Hyperlipidemia, unspecified: Secondary | ICD-10-CM

## 2019-04-15 DIAGNOSIS — I10 Essential (primary) hypertension: Secondary | ICD-10-CM | POA: Diagnosis not present

## 2019-04-15 DIAGNOSIS — E876 Hypokalemia: Secondary | ICD-10-CM

## 2019-04-15 DIAGNOSIS — M545 Low back pain: Secondary | ICD-10-CM

## 2019-04-15 DIAGNOSIS — J449 Chronic obstructive pulmonary disease, unspecified: Secondary | ICD-10-CM

## 2019-04-15 DIAGNOSIS — Z6832 Body mass index (BMI) 32.0-32.9, adult: Secondary | ICD-10-CM

## 2019-04-15 DIAGNOSIS — F3342 Major depressive disorder, recurrent, in full remission: Secondary | ICD-10-CM

## 2019-04-15 MED ORDER — PAROXETINE HCL 20 MG PO TABS: 20.0000 mg | ORAL_TABLET | Freq: Every day | ORAL | 1 refills | Status: DC

## 2019-04-15 MED ORDER — BREO ELLIPTA 200-25 MCG/INH IN AEPB: 1.0000 | INHALATION_SPRAY | Freq: Every day | RESPIRATORY_TRACT | 5 refills | Status: DC

## 2019-04-15 MED ORDER — LISINOPRIL-HYDROCHLOROTHIAZIDE 10-12.5 MG PO TABS: 1.0000 | ORAL_TABLET | Freq: Every day | ORAL | 1 refills | Status: DC

## 2019-04-15 MED ORDER — ALPRAZOLAM 0.5 MG PO TABS: 0.5000 mg | ORAL_TABLET | Freq: Three times a day (TID) | ORAL | 2 refills | Status: DC | PRN

## 2019-04-15 MED ORDER — IPRATROPIUM BROMIDE 0.02 % IN SOLN: 0.5000 mg | Freq: Four times a day (QID) | RESPIRATORY_TRACT | 12 refills | Status: DC | PRN

## 2019-04-15 MED ORDER — SIMVASTATIN 40 MG PO TABS: 40.0000 mg | ORAL_TABLET | Freq: Every day | ORAL | 1 refills | Status: DC

## 2019-04-15 MED ORDER — HYDROCODONE-ACETAMINOPHEN 10-325 MG PO TABS: 1.0000 | ORAL_TABLET | Freq: Three times a day (TID) | ORAL | 0 refills | Status: DC | PRN

## 2019-04-15 MED ORDER — PANTOPRAZOLE SODIUM 40 MG PO TBEC: 40.0000 mg | DELAYED_RELEASE_TABLET | Freq: Two times a day (BID) | ORAL | 1 refills | Status: DC

## 2019-04-15 NOTE — Progress Notes (Signed)
Patient ID: [name], female   DOB: [birth year], 73 y.o.   MRN: [id number]    Virtual Visit via telephone Note  I connected with [name] on 04/15/19 at 9:20 by telephone and verified that I am speaking with the correct person using two identifiers. [name] is currently located at home and no one is currently with her during visit. The provider, [name], FNP is located in their  Home office at time of visit.  I discussed the limitations, risks, security and privacy concerns of performing an evaluation and management service by telephone and the availability of in person appointments. I also discussed with the patient that there may be a patient responsible charge related to this service. The patient expressed understanding and agreed to proceed.   History and Present Illness:   Chief Complaint: Medical Management of Chronic Issues    HPI:  1. Benign hypertension No c/o chest pain, sob or headache. Does not check blood pressure at home. BP Readings from Last 3 Encounters:  10/16/18 124/70  07/12/18 124/79  04/13/18 (!) 142/76     2. Aortic atherosclerosis ([place]) Has never seen cardiology. Says that she doe snot want to right now.  3. COPD (chronic obstructive pulmonary disease) with chronic bronchitis ([place]) She continues to have a cough. deneis any SOB. She is currently not on any regular inhalers. Uses xopenex daily .   4. Smoker smokes over a pack a day  5. Gastroesophageal reflux disease without esophagitis Is on protonix daily and works well for her.  6. Hypokalemia Has cramping at least every other day  7. Anxiety Stays anxious, she is on xanax 0.'5mg'$  BID. She says she needs to go back up to '1mg'$  but she is on pain meds and we cannot increase.  8. Recurrent major depressive disorder, in full remission [facility]) She is currently on paxil and has been taking that for some time. Depression screen [name] 2/9 04/15/2019 01/15/2019  07/12/2018  Decreased Interest 1 1 0  Down, Depressed, Hopeless 1 1 0  PHQ - 2 Score 2 2 0  Altered sleeping 1 - -  Tired, decreased energy 2 - -  Change in appetite 0 - -  Feeling bad or failure about yourself  1 - -  Trouble concentrating 1 - -  Moving slowly or fidgety/restless 0 - -  Suicidal thoughts 0 - -  PHQ-9 Score 7 - -  Difficult doing work/chores Somewhat difficult - -  Some recent data might be hidden     9. Hyperlipidemia with target LDL less than 100 She has not been watching diet and doing no exercise.  10. Chronic midline low back pain without sciatica Pain assessment: Cause of pain- DDD Pain location- low back pain Pain on scale of 1-10- 7/10 Frequency- daily What increases pain-to much activity What makes pain Better-rest Effects on ADL - some days she is not able to do very much Any change in general medical condition-none  Current opioids rx- norco 10/325 3x a day # meds rx- 90 Effectiveness of current meds-helps Adverse reactions form pain meds-none Morphine equivalent- 30 MEDD  Pill count performed-No Last drug screen - needs to have done last done 2017 ( high risk q110m moderate risk q688mlow risk yearly ) Urine drug screen today- Yes [place]  If yes were their any concerning findings? - no    Pain contract signed on: 07/13/18   11. BMI 32.0-32.9,adult No recent weight changes  Outpatient Encounter Medications as of 04/15/2019  Medication Sig  . albuterol (PROAIR HFA) 108 (90 Base) MCG/ACT inhaler Inhale 2 puffs by mouth every 6 hours as needed for wheezing or shortness of breath  . ALPRAZolam (XANAX) 0.5 MG tablet TAKE 1 TABLET (0.5 MG TOTAL) BY MOUTH 3 (THREE) TIMES DAILY AS NEEDED FOR ANXIETY.  . Aspirin-Acetaminophen-Caffeine (GOODYS EXTRA STRENGTH) 500-325-65 MG PACK Take 1 packet by mouth every 8 (eight) hours as needed (for pain.).  Marland Kitchen aspirin-sod bicarb-citric acid (ALKA-SELTZER) 325 MG TBEF tablet Take 325  mg by mouth every 6 (six) hours as needed (cold/congestion).  . calcium carbonate (OS-CAL) 600 MG TABS Take 600 mg by mouth daily.   . Cholecalciferol (VITAMIN D3) 1000 UNITS CAPS Take 3,000 mg by mouth daily.   Marland Kitchen dicyclomine (BENTYL) 10 MG capsule Take 1 capsule (10 mg total) by mouth 4 (four) times daily as needed for spasms.  . diphenhydrAMINE (BENADRYL) 25 MG tablet Take 25 mg by mouth 2 (two) times daily as needed. For Allergies  . fluticasone (FLONASE) 50 MCG/ACT nasal spray SPRAY 2 SPRAYS INTO EACH NOSTRIL EVERY DAY  . HYDROcodone-acetaminophen (NORCO) 10-325 MG tablet Take 1 tablet by mouth every 8 (eight) hours as needed for up to 30 days.  Marland Kitchen HYDROcodone-acetaminophen (NORCO) 10-325 MG tablet Take 1 tablet by mouth every 8 (eight) hours as needed for up to 30 days.  Marland Kitchen HYDROcodone-acetaminophen (NORCO) 10-325 MG tablet Take 1 tablet by mouth every 8 (eight) hours as needed for up to 30 days.  Marland Kitchen ipratropium (ATROVENT) 0.02 % nebulizer solution Take 2.5 mLs (0.5 mg total) by nebulization every 6 (six) hours as needed for wheezing or shortness of breath.  . levalbuterol (XOPENEX) 1.25 MG/0.5ML nebulizer solution Take 1.25 mg by nebulization every 6 (six) hours as needed for wheezing or shortness of breath.  . lisinopril-hydrochlorothiazide (PRINZIDE,ZESTORETIC) 10-12.5 MG tablet Take 1 tablet by mouth daily.  . Multiple Vitamin (MULITIVITAMIN WITH MINERALS) TABS Take 1 tablet by mouth daily.  . Nebulizers (COMPRESSOR/NEBULIZER) MISC Please use with nebulizer solution every 6 hours as needed for shortness of breath or wheezing.  . pantoprazole (PROTONIX) 40 MG tablet Take 1 tablet (40 mg total) by mouth 2 (two) times daily.  Marland Kitchen PARoxetine (PAXIL) 20 MG tablet Take 1 tablet (20 mg total) by mouth daily.  . simvastatin (ZOCOR) 40 MG tablet Take 1 tablet (40 mg total) by mouth at bedtime.  . vitamin C (ASCORBIC ACID) 500 MG tablet Take 1,000 mg by mouth daily.      Past Surgical History:   Procedure Laterality Date  . APPENDECTOMY     73 years old  . BACK SURGERY     X 2  . BIOPSY  08/11/2016   Procedure: BIOPSY;  Surgeon: Daneil Dolin, MD;  [facility];  Service: Endoscopy;;  gastric bx's  . BREAST SURGERY     biopsy only  . CARPAL TUNNEL RELEASE     right hand  . CERVICAL BIOPSY  W/ LOOP ELECTRODE EXCISION  02/2011   CIN-2 with focal ectocervical margin involvement. With CIN-1 ECC negative  . CHOLECYSTECTOMY  1984  . ESOPHAGOGASTRODUODENOSCOPY  12/01/2011   Hiatal hernia/Mild fibrotic pyloric stenosis status post dilation with passage of  the scope.  The previously noted gastric ulcer completely healed  The remainder of the gastric mucosa appeared normal  . ESOPHAGOGASTRODUODENOSCOPY (EGD) WITH PROPOFOL N/A 08/11/2016   Procedure: ESOPHAGOGASTRODUODENOSCOPY (EGD) WITH PROPOFOL;  Surgeon: Daneil Dolin, MD;  [facility];  Service: Endoscopy;  Laterality: N/A;  2:15 PM  . FRACTURE SURGERY  10/18/2016   left knee in 3 places  . MALONEY DILATION  07/28/2011   Schatzki's ring s/p 23F with small UES tear as well, small hh, 1cm pyloric channel ulcer, bx benign without H.Pylori. Mobic/goody's at time.  Marland Kitchen MALONEY DILATION N/A 08/11/2016   Procedure: Venia Minks DILATION;  Surgeon: Daneil Dolin, MD;  [facility];  Service: Endoscopy;  Laterality: N/A;  . ORIF PATELLA Left 10/19/2016   Procedure: OPEN REDUCTION INTERNAL (ORIF) FIXATION PATELLA;  Surgeon: Carole Civil, MD;  Location: [facility];  Service: Orthopedics;  Laterality: Left;  . SPINE SURGERY  2009   cervical disc  . SPINE SURGERY  2009   lumbar  . TUBAL LIGATION  1976  . tumor removed from arm      Family History  Problem Relation Age of Onset  . Heart disease Father        CHF  . Heart disease Mother   . COPD Sister   . Diabetes Sister   . Neuropathy Sister   . COPD Sister   . COPD Sister   . Cancer Sister   . Stroke Brother   . Heart disease Brother   . Colon cancer  Neg Hx   . Anesthesia problems Neg Hx   . Hypotension Neg Hx   . Malignant hyperthermia Neg Hx   . Pseudochol deficiency Neg Hx   . Colon polyps Neg Hx     New complaints: Thinks may have UTI- slight dysuria  Social history: Lives alone and family checks on her frequently     Review of Systems  Constitutional: Negative for diaphoresis and weight loss.  Eyes: Negative for blurred vision, double vision and pain.  Respiratory: Positive for cough and shortness of breath (occasionally).   Cardiovascular: Positive for leg swelling. Negative for chest pain, palpitations and orthopnea.  Gastrointestinal: Negative for abdominal pain.  Musculoskeletal: Positive for back pain.  Skin: Negative for rash.  Neurological: Negative for dizziness, sensory change, loss of consciousness, weakness and headaches.  Endo/Heme/Allergies: Negative for polydipsia. Does not bruise/bleed easily.  Psychiatric/Behavioral: Positive for depression. Negative for memory loss. The patient does not have insomnia.   All other systems reviewed and are negative.    Observations/Objective: Alert and oriented- answers all questions appropriately No distress today  Assessment and Plan: ALONDRA VANDEVEN comes in today with chief complaint of Medical Management of Chronic Issues   Diagnosis and orders addressed:  1. Benign hypertension Low sodium diet - lisinopril-hydrochlorothiazide (ZESTORETIC) 10-12.5 MG tablet; Take 1 tablet by mouth daily.  Dispense: 90 tablet; Refill: 1 - CMP14+EGFR; Future  2. Aortic atherosclerosis ([place])   3. COPD (chronic obstructive pulmonary disease) with chronic bronchitis ([facility]) Added BREO to meds Try to not use albuterol daily - fluticasone furoate-vilanterol (BREO ELLIPTA) 200-25 MCG/INH AEPB; Inhale 1 puff into the lungs daily.  Dispense: 1 each; Refill: 5  4. Smoker Smoking cessation encouraged  5. Gastroesophageal reflux disease without esophagitis Avoid spicy foods  Do not eat 2 hours prior to bedtime - pantoprazole (PROTONIX) 40 MG tablet; Take 1 tablet (40 mg total) by mouth 2 (two) times daily.  Dispense: 180 tablet; Refill: 1  6. Hypokalemia Will wait on lab results before adding potasssium  7. Anxiety Stress management - ALPRAZolam (XANAX) 0.5 MG tablet; Take 1 tablet (0.5 mg total) by mouth 3 (three) times daily as needed for anxiety.  Dispense: 90 tablet; Refill:  2  8. Recurrent major depressive disorder, in full remission ([place]) - PARoxetine (PAXIL) 20 MG tablet; Take 1 tablet (20 mg total) by mouth daily.  Dispense: 90 tablet; Refill: 1  9. Hyperlipidemia with target LDL less than 100 Low fat diet - simvastatin (ZOCOR) 40 MG tablet; Take 1 tablet (40 mg total) by mouth at bedtime.  Dispense: 90 tablet; Refill: 1 - Lipid panel; Future  10. Chronic midline low back pain without sciatica Back stretches - HYDROcodone-acetaminophen (NORCO) 10-325 MG tablet; Take 1 tablet by mouth every 8 (eight) hours as needed.  Dispense: 90 tablet; Refill: 0 - HYDROcodone-acetaminophen (NORCO) 10-325 MG tablet; Take 1 tablet by mouth every 8 (eight) hours as needed.  Dispense: 90 tablet; Refill: 0 - HYDROcodone-acetaminophen (NORCO) 10-325 MG tablet; Take 1 tablet by mouth every 8 (eight) hours as needed.  Dispense: 90 tablet; Refill: 0 - Urinalysis, Complete; Future - ToxASSURE Select 13 (MW), Urine; Future  11. BMI 32.0-32.9,adult Discussed diet and exercise for person with BMI >25 Will recheck weight in 3-6 months   Labs pending- patinet coming in tomorrow for labs Health Maintenance reviewed Diet and exercise encouraged  Follow up plan: 3 months     I discussed the assessment and treatment plan with the patient. The patient was provided an opportunity to ask questions and all were answered. The patient agreed with the plan and demonstrated an understanding of the instructions.   The patient was advised to call back or seek an in-person  evaluation if the symptoms worsen or if the condition fails to improve as anticipated.  The above assessment and management plan was discussed with the patient. The patient verbalized understanding of and has agreed to the management plan. Patient is aware to call the clinic if symptoms persist or worsen. Patient is aware when to return to the clinic for a follow-up visit. Patient educated on when it is appropriate to go to the emergency department.   Time call ended:  9:37  I provided 17 min minutes of non-face-to-face time during this encounter.    [name], FNP

## 2019-04-16 ENCOUNTER — Other Ambulatory Visit: Payer: Medicare Other

## 2019-04-16 ENCOUNTER — Other Ambulatory Visit: Payer: Self-pay

## 2019-04-16 DIAGNOSIS — E785 Hyperlipidemia, unspecified: Secondary | ICD-10-CM | POA: Diagnosis not present

## 2019-04-16 DIAGNOSIS — G8929 Other chronic pain: Secondary | ICD-10-CM | POA: Diagnosis not present

## 2019-04-16 DIAGNOSIS — I10 Essential (primary) hypertension: Secondary | ICD-10-CM

## 2019-04-16 DIAGNOSIS — M545 Low back pain, unspecified: Secondary | ICD-10-CM

## 2019-04-16 LAB — URINALYSIS, COMPLETE
Bilirubin, UA: NEGATIVE
Glucose, UA: NEGATIVE
Nitrite, UA: POSITIVE — AB
Protein,UA: NEGATIVE
RBC, UA: NEGATIVE
Specific Gravity, UA: 1.02 (ref 1.005–1.030)
Urobilinogen, Ur: 0.2 mg/dL (ref 0.2–1.0)
pH, UA: 5.5 (ref 5.0–7.5)

## 2019-04-16 LAB — MICROSCOPIC EXAMINATION

## 2019-04-17 ENCOUNTER — Ambulatory Visit: Payer: Medicare Other | Admitting: Nurse Practitioner

## 2019-04-17 LAB — LIPID PANEL
Chol/HDL Ratio: 2.2 ratio (ref 0.0–4.4)
Cholesterol, Total: 164 mg/dL (ref 100–199)
HDL: 76 mg/dL (ref >39)
LDL Calculated: 67 mg/dL (ref 0–99)
Triglycerides: 104 mg/dL (ref 0–149)
VLDL Cholesterol Cal: 21 mg/dL (ref 5–40)

## 2019-04-17 LAB — CMP14+EGFR
ALT: 13 IU/L (ref 0–32)
AST: 15 IU/L (ref 0–40)
Albumin/Globulin Ratio: 2.4 — ABNORMAL HIGH (ref 1.2–2.2)
Albumin: 4.4 g/dL (ref 3.7–4.7)
Alkaline Phosphatase: 76 IU/L (ref 39–117)
BUN/Creatinine Ratio: 10 — ABNORMAL LOW (ref 12–28)
BUN: 6 mg/dL — ABNORMAL LOW (ref 8–27)
Bilirubin Total: 0.5 mg/dL (ref 0.0–1.2)
CO2: 26 mmol/L (ref 20–29)
Calcium: 9.9 mg/dL (ref 8.7–10.3)
Chloride: 97 mmol/L (ref 96–106)
Creatinine, Ser: 0.59 mg/dL (ref 0.57–1.00)
GFR calc Af Amer: 107 mL/min/{1.73_m2} (ref >59)
GFR calc non Af Amer: 93 mL/min/{1.73_m2} (ref >59)
Globulin, Total: 1.8 g/dL (ref 1.5–4.5)
Glucose: 108 mg/dL — ABNORMAL HIGH (ref 65–99)
Potassium: 4.4 mmol/L (ref 3.5–5.2)
Sodium: 139 mmol/L (ref 134–144)
Total Protein: 6.2 g/dL (ref 6.0–8.5)

## 2019-04-17 MED ORDER — CIPROFLOXACIN HCL 500 MG PO TABS: 500.0000 mg | ORAL_TABLET | Freq: Two times a day (BID) | ORAL | 0 refills | Status: DC

## 2019-04-18 LAB — TOXASSURE SELECT 13 (MW), URINE

## 2019-04-23 DIAGNOSIS — J441 Chronic obstructive pulmonary disease with (acute) exacerbation: Secondary | ICD-10-CM | POA: Diagnosis not present

## 2019-05-09 ENCOUNTER — Ambulatory Visit: Payer: Medicare Other | Admitting: Nurse Practitioner

## 2019-05-24 DIAGNOSIS — J441 Chronic obstructive pulmonary disease with (acute) exacerbation: Secondary | ICD-10-CM | POA: Diagnosis not present

## 2019-05-30 ENCOUNTER — Ambulatory Visit (INDEPENDENT_AMBULATORY_CARE_PROVIDER_SITE_OTHER): Payer: Medicare Other | Admitting: Nurse Practitioner

## 2019-05-30 ENCOUNTER — Encounter: Payer: Self-pay | Admitting: Nurse Practitioner

## 2019-05-30 DIAGNOSIS — N3000 Acute cystitis without hematuria: Secondary | ICD-10-CM

## 2019-05-30 MED ORDER — CEPHALEXIN 500 MG PO CAPS: 500.0000 mg | ORAL_CAPSULE | Freq: Two times a day (BID) | ORAL | 0 refills | Status: DC

## 2019-05-30 NOTE — Progress Notes (Signed)
Virtual Visit via telephone Note Due to COVID-19 pandemic this visit was conducted virtually. This visit type was conducted due to national recommendations for restrictions regarding the COVID-19 Pandemic (e.g. social distancing, sheltering in place) in an effort to limit this patient's exposure and mitigate transmission in our community. All issues noted in this document were discussed and addressed.  A physical exam was not performed with this format.  I connected with [name] on 05/30/19 at 8:00 by telephone and verified that I am speaking with the correct person using two identifiers. [name] is currently located at home and no one  is currently with her during visit. The provider, [name], FNP is located in their office at time of visit.  I discussed the limitations, risks, security and privacy concerns of performing an evaluation and management service by telephone and the availability of in person appointments. I also discussed with the patient that there may be a patient responsible charge related to this service. The patient expressed understanding and agreed to proceed.   History and Present Illness:   Chief Complaint: Urinary Tract Infection   HPI Patient calls in today c/o of dysuria and urinary frequency started a bout 1 week ago. She has been trying to drink a lot of liquids and avoiding soft drinks. She has been taking AZO for the last 3 days which has helped with symptoms.    Review of Systems  Constitutional: Negative for diaphoresis and weight loss.  Eyes: Negative for blurred vision, double vision and pain.  Respiratory: Negative for shortness of breath.   Cardiovascular: Negative for chest pain, palpitations, orthopnea and leg swelling.  Gastrointestinal: Negative for abdominal pain, nausea and vomiting.  Genitourinary: Positive for dysuria, frequency and urgency. Negative for flank pain and hematuria.  Skin: Negative for rash.   Neurological: Negative for dizziness, sensory change, loss of consciousness, weakness and headaches.  Endo/Heme/Allergies: Negative for polydipsia. Does not bruise/bleed easily.  Psychiatric/Behavioral: Negative for memory loss. The patient does not have insomnia.   All other systems reviewed and are negative.    Observations/Objective: Alert and oriented- answers all questions appropriately No distress  Assessment and Plan: [name] in today with chief complaint of Urinary Tract Infection   1. Acute cystitis without hematuria Take medication as prescribe Cotton underwear Take shower not bath Cranberry juice, yogurt Force fluids AZO over the counter X2 days RTO prn  - cephALEXin (KEFLEX) 500 MG capsule; Take 1 capsule (500 mg total) by mouth 2 (two) times daily.  Dispense: 14 capsule; Refill: 0   Follow Up Instructions: prn    I discussed the assessment and treatment plan with the patient. The patient was provided an opportunity to ask questions and all were answered. The patient agreed with the plan and demonstrated an understanding of the instructions.   The patient was advised to call back or seek an in-person evaluation if the symptoms worsen or if the condition fails to improve as anticipated.  The above assessment and management plan was discussed with the patient. The patient verbalized understanding of and has agreed to the management plan. Patient is aware to call the clinic if symptoms persist or worsen. Patient is aware when to return to the clinic for a follow-up visit. Patient educated on when it is appropriate to go to the emergency department.   Time call ended:  8:12  I provided 12 minutes of non-face-to-face time during this encounter.    [name], FNP

## 2019-07-11 ENCOUNTER — Encounter: Payer: Self-pay | Admitting: Nurse Practitioner

## 2019-07-11 ENCOUNTER — Ambulatory Visit (INDEPENDENT_AMBULATORY_CARE_PROVIDER_SITE_OTHER): Payer: Medicare Other | Admitting: Nurse Practitioner

## 2019-07-11 ENCOUNTER — Ambulatory Visit: Payer: Medicare Other | Admitting: Nurse Practitioner

## 2019-07-11 ENCOUNTER — Other Ambulatory Visit: Payer: Self-pay

## 2019-07-11 VITALS — BP 137/78 | HR 72 | Temp 97.3°F | Ht 65.0 in | Wt 184.8 lb

## 2019-07-11 DIAGNOSIS — M545 Low back pain, unspecified: Secondary | ICD-10-CM

## 2019-07-11 DIAGNOSIS — F419 Anxiety disorder, unspecified: Secondary | ICD-10-CM

## 2019-07-11 DIAGNOSIS — I10 Essential (primary) hypertension: Secondary | ICD-10-CM

## 2019-07-11 DIAGNOSIS — F3342 Major depressive disorder, recurrent, in full remission: Secondary | ICD-10-CM

## 2019-07-11 DIAGNOSIS — K529 Noninfective gastroenteritis and colitis, unspecified: Secondary | ICD-10-CM

## 2019-07-11 DIAGNOSIS — E876 Hypokalemia: Secondary | ICD-10-CM

## 2019-07-11 DIAGNOSIS — J449 Chronic obstructive pulmonary disease, unspecified: Secondary | ICD-10-CM

## 2019-07-11 DIAGNOSIS — F17200 Nicotine dependence, unspecified, uncomplicated: Secondary | ICD-10-CM

## 2019-07-11 DIAGNOSIS — G8929 Other chronic pain: Secondary | ICD-10-CM

## 2019-07-11 DIAGNOSIS — E785 Hyperlipidemia, unspecified: Secondary | ICD-10-CM | POA: Diagnosis not present

## 2019-07-11 DIAGNOSIS — J4489 Other specified chronic obstructive pulmonary disease: Secondary | ICD-10-CM

## 2019-07-11 DIAGNOSIS — K219 Gastro-esophageal reflux disease without esophagitis: Secondary | ICD-10-CM

## 2019-07-11 DIAGNOSIS — Z6832 Body mass index (BMI) 32.0-32.9, adult: Secondary | ICD-10-CM

## 2019-07-11 MED ORDER — HYDROCODONE-ACETAMINOPHEN 10-325 MG PO TABS: 1.0000 | ORAL_TABLET | Freq: Three times a day (TID) | ORAL | 0 refills | Status: DC | PRN

## 2019-07-11 MED ORDER — ALPRAZOLAM 0.5 MG PO TABS: 0.5000 mg | ORAL_TABLET | Freq: Three times a day (TID) | ORAL | 2 refills | Status: DC | PRN

## 2019-07-11 NOTE — Addendum Note (Signed)
Addended by: Chevis Pretty on: 07/11/2019 03:59 PM   Modules accepted: Orders

## 2019-07-11 NOTE — Patient Instructions (Signed)
Steps to Quit Smoking Smoking tobacco is the leading cause of preventable death. It can affect almost every organ in the body. Smoking puts you and people around you at risk for many serious, long-lasting (chronic) diseases. Quitting smoking can be hard, but it is one of the best things that you can do for your health. It is never too late to quit. How do I get ready to quit? When you decide to quit smoking, make a plan to help you succeed. Before you quit:  Pick a date to quit. Set a date within the next 2 weeks to give you time to prepare.  Write down the reasons why you are quitting. Keep this list in places where you will see it often.  Tell your family, friends, and co-workers that you are quitting. Their support is important.  Talk with your doctor about the choices that may help you quit.  Find out if your health insurance will pay for these treatments.  Know the people, places, things, and activities that make you want to smoke (triggers). Avoid them. What first steps can I take to quit smoking?  Throw away all cigarettes at home, at work, and in your car.  Throw away the things that you use when you smoke, such as ashtrays and lighters.  Clean your car. Make sure to empty the ashtray.  Clean your home, including curtains and carpets. What can I do to help me quit smoking? Talk with your doctor about taking medicines and seeing a counselor at the same time. You are more likely to succeed when you do both.  If you are pregnant or breastfeeding, talk with your doctor about counseling or other ways to quit smoking. Do not take medicine to help you quit smoking unless your doctor tells you to do so. To quit smoking: Quit right away  Quit smoking totally, instead of slowly cutting back on how much you smoke over a period of time.  Go to counseling. You are more likely to quit if you go to counseling sessions regularly. Take medicine You may take medicines to help you quit. Some  medicines need a prescription, and some you can buy over-the-counter. Some medicines may contain a drug called nicotine to replace the nicotine in cigarettes. Medicines may:  Help you to stop having the desire to smoke (cravings).  Help to stop the problems that come when you stop smoking (withdrawal symptoms). Your doctor may ask you to use:  Nicotine patches, gum, or lozenges.  Nicotine inhalers or sprays.  Non-nicotine medicine that is taken by mouth. Find resources Find resources and other ways to help you quit smoking and remain smoke-free after you quit. These resources are most helpful when you use them often. They include:  Online chats with a counselor.  Phone quitlines.  Printed self-help materials.  Support groups or group counseling.  Text messaging programs.  Mobile phone apps. Use apps on your mobile phone or tablet that can help you stick to your quit plan. There are many free apps for mobile phones and tablets as well as websites. Examples include Quit Guide from the CDC and smokefree.gov  What things can I do to make it easier to quit?   Talk to your family and friends. Ask them to support and encourage you.  Call a phone quitline (1-800-QUIT-NOW), reach out to support groups, or work with a counselor.  Ask people who smoke to not smoke around you.  Avoid places that make you want to smoke,   such as: ? Bars. ? Parties. ? Smoke-break areas at work.  Spend time with people who do not smoke.  Lower the stress in your life. Stress can make you want to smoke. Try these things to help your stress: ? Getting regular exercise. ? Doing deep-breathing exercises. ? Doing yoga. ? Meditating. ? Doing a body scan. To do this, close your eyes, focus on one area of your body at a time from head to toe. Notice which parts of your body are tense. Try to relax the muscles in those areas. How will I feel when I quit smoking? Day 1 to 3 weeks Within the first 24 hours,  you may start to have some problems that come from quitting tobacco. These problems are very bad 2-3 days after you quit, but they do not often last for more than 2-3 weeks. You may get these symptoms:  Mood swings.  Feeling restless, nervous, angry, or annoyed.  Trouble concentrating.  Dizziness.  Strong desire for high-sugar foods and nicotine.  Weight gain.  Trouble pooping (constipation).  Feeling like you may vomit (nausea).  Coughing or a sore throat.  Changes in how the medicines that you take for other issues work in your body.  Depression.  Trouble sleeping (insomnia). Week 3 and afterward After the first 2-3 weeks of quitting, you may start to notice more positive results, such as:  Better sense of smell and taste.  Less coughing and sore throat.  Slower heart rate.  Lower blood pressure.  Clearer skin.  Better breathing.  Fewer sick days. Quitting smoking can be hard. Do not give up if you fail the first time. Some people need to try a few times before they succeed. Do your best to stick to your quit plan, and talk with your doctor if you have any questions or concerns. Summary  Smoking tobacco is the leading cause of preventable death. Quitting smoking can be hard, but it is one of the best things that you can do for your health.  When you decide to quit smoking, make a plan to help you succeed.  Quit smoking right away, not slowly over a period of time.  When you start quitting, seek help from your doctor, family, or friends. This information is not intended to replace advice given to you by your health care provider. Make sure you discuss any questions you have with your health care provider. Document Released: 07/16/2009 Document Revised: 12/07/2018 Document Reviewed: 12/08/2018 Elsevier Patient Education  2020 [employer]  

## 2019-07-11 NOTE — Progress Notes (Signed)
Subjective:    Patient ID: [name], female    DOB: 11-24-1945, 73 y.o.   MRN: [id number]   Chief Complaint: Medical Management of Chronic Issues    HPI:  1. Chronic midline low back pain without sciatica Pain assessment: Cause of pain- DDD Pain location- low back Pain on scale of 1-10- 8/10 Frequency- daily What increases pain-to much activity What makes pain Better-rest Effects on ADL - none Any change in general medical condition-none  Current opioids rx- norce 10/325 TID # meds rx- 90 Effectiveness of current meds-helps Adverse reactions form pain meds-none Morphine equivalent- 30 MEDD  Pill count performed-No Last drug screen - 04/16/19 ( high risk q56m, moderate risk q35m, low risk yearly ) Urine drug screen today- No Was the [place] reviewed- yes  If yes were their any concerning findings? - none   Pain contract signed on:07/11/19   2. Anxiety about the same. She is on xanax 0.5mg  tid- I have tried weaning er down because of her pain meds but she starts having panic attacks without.  3. Hyperlipidemia with target LDL less than 100 Does not watch diet and does no exercise. Lab Results  Component Value Date   CHOL 164 04/16/2019   HDL 76 04/16/2019   LDLCALC 67 04/16/2019   TRIG 104 04/16/2019   CHOLHDL 2.2 04/16/2019     4. Hypokalemia *no lower ext cramping Lab Results  Component Value Date   K 4.4 04/16/2019     5. Benign hypertension No c/o chest pain, sob or headache. Does not check blood pressure at home. BP Readings from Last 3 Encounters:  07/11/19 137/78  10/16/18 124/70  07/12/18 124/79     6. Chronic diarrhea Is some better. Is loose but not as runny.  7. Gastroesophageal reflux disease without esophagitis Is on protonix daily and works well to keep symptoms under control.  8. Recurrent major depressive disorder, in full remission ([place]) Is on paxil daily and is doing well. Depression screen [facility] 2/9 07/11/2019  04/15/2019 01/15/2019  Decreased Interest 0 1 1  Down, Depressed, Hopeless 0 1 1  PHQ - 2 Score 0 2 2  Altered sleeping - 1 -  Tired, decreased energy - 2 -  Change in appetite - 0 -  Feeling bad or failure about yourself  - 1 -  Trouble concentrating - 1 -  Moving slowly or fidgety/restless - 0 -  Suicidal thoughts - 0 -  PHQ-9 Score - 7 -  Difficult doing work/chores - Somewhat difficult -  Some recent data might be hidden     9. Smoker Smoke over a pck a day. Says she just cant quit  10. COPD (chronic obstructive pulmonary disease) with chronic bronchitis (HCC) **is on BREO daily and atrovent as needed. She says she does well most days. doesnot use her oxygen during th eday. Only uses it at night.  11. BMI 32.0-32.9,adult No recent weight changes Wt Readings from Last 3 Encounters:  07/11/19 184 lb 12.8 oz (83.8 kg)  10/16/18 176 lb (79.8 kg)  07/12/18 178 lb (80.7 kg)   BMI Readings from Last 3 Encounters:  07/11/19 30.75 kg/m  10/16/18 29.29 kg/m  07/12/18 29.62 kg/m       Outpatient Encounter Medications as of 07/11/2019  Medication Sig   albuterol (PROAIR HFA) 108 (90 Base) MCG/ACT inhaler Inhale 2 puffs by mouth every 6 hours as needed for wheezing or shortness of breath   ALPRAZolam (XANAX) 0.5 MG tablet Take  1 tablet (0.5 mg total) by mouth 3 (three) times daily as needed for anxiety.   Aspirin-Acetaminophen-Caffeine (GOODYS EXTRA STRENGTH) 500-325-65 MG PACK Take 1 packet by mouth every 8 (eight) hours as needed (for pain.).   aspirin-sod bicarb-citric acid (ALKA-SELTZER) 325 MG TBEF tablet Take 325 mg by mouth every 6 (six) hours as needed (cold/congestion).   calcium carbonate (OS-CAL) 600 MG TABS Take 600 mg by mouth daily.    Cholecalciferol (VITAMIN D3) 1000 UNITS CAPS Take 3,000 mg by mouth daily.    dicyclomine (BENTYL) 10 MG capsule Take 1 capsule (10 mg total) by mouth 4 (four) times daily as needed for spasms.   diphenhydrAMINE (BENADRYL)  25 MG tablet Take 25 mg by mouth 2 (two) times daily as needed. For Allergies   fluticasone (FLONASE) 50 MCG/ACT nasal spray SPRAY 2 SPRAYS INTO EACH NOSTRIL EVERY DAY   fluticasone furoate-vilanterol (BREO ELLIPTA) 200-25 MCG/INH AEPB Inhale 1 puff into the lungs daily.   HYDROcodone-acetaminophen (NORCO) 10-325 MG tablet Take 1 tablet by mouth every 8 (eight) hours as needed.   ipratropium (ATROVENT) 0.02 % nebulizer solution Take 2.5 mLs (0.5 mg total) by nebulization every 6 (six) hours as needed for wheezing or shortness of breath.   levalbuterol (XOPENEX) 1.25 MG/0.5ML nebulizer solution Take 1.25 mg by nebulization every 6 (six) hours as needed for wheezing or shortness of breath.   lisinopril-hydrochlorothiazide (ZESTORETIC) 10-12.5 MG tablet Take 1 tablet by mouth daily.   Multiple Vitamin (MULITIVITAMIN WITH MINERALS) TABS Take 1 tablet by mouth daily.   Nebulizers (COMPRESSOR/NEBULIZER) MISC Please use with nebulizer solution every 6 hours as needed for shortness of breath or wheezing.   pantoprazole (PROTONIX) 40 MG tablet Take 1 tablet (40 mg total) by mouth 2 (two) times daily.   PARoxetine (PAXIL) 20 MG tablet Take 1 tablet (20 mg total) by mouth daily.   simvastatin (ZOCOR) 40 MG tablet Take 1 tablet (40 mg total) by mouth at bedtime.   vitamin C (ASCORBIC ACID) 500 MG tablet Take 1,000 mg by mouth daily.    HYDROcodone-acetaminophen (NORCO) 10-325 MG tablet Take 1 tablet by mouth every 8 (eight) hours as needed.   HYDROcodone-acetaminophen (NORCO) 10-325 MG tablet Take 1 tablet by mouth every 8 (eight) hours as needed.     Past Surgical History:  Procedure Laterality Date   APPENDECTOMY     73 years old   BACK SURGERY     X 2   BIOPSY  08/11/2016   Procedure: BIOPSY;  Surgeon: Daneil Dolin, MD;  [facility];  Service: Endoscopy;;  gastric bx's   BREAST SURGERY     biopsy only   CARPAL TUNNEL RELEASE     right hand   CERVICAL BIOPSY  W/  LOOP ELECTRODE EXCISION  02/2011   CIN-2 with focal ectocervical margin involvement. With CIN-1 ECC negative   CHOLECYSTECTOMY  1984   ESOPHAGOGASTRODUODENOSCOPY  12/01/2011   Hiatal hernia/Mild fibrotic pyloric stenosis status post dilation with passage of  the scope.  The previously noted gastric ulcer completely healed  The remainder of the gastric mucosa appeared normal   ESOPHAGOGASTRODUODENOSCOPY (EGD) WITH PROPOFOL N/A 08/11/2016   Procedure: ESOPHAGOGASTRODUODENOSCOPY (EGD) WITH PROPOFOL;  Surgeon: Daneil Dolin, MD;  Location: [facility];  Service: Endoscopy;  Laterality: N/A;  2:15 PM   FRACTURE SURGERY  10/18/2016   left knee in 3 places   [place]  07/28/2011   Schatzki's ring s/p 83F with small UES tear as well, small  hh, 1cm pyloric channel ulcer, bx benign without H.Pylori. Mobic/goody's at time.   MALONEY DILATION N/A 08/11/2016   Procedure: Venia Minks DILATION;  Surgeon: Daneil Dolin, MD;  Location: [facility];  Service: Endoscopy;  Laterality: N/A;   ORIF PATELLA Left 10/19/2016   Procedure: OPEN REDUCTION INTERNAL (ORIF) FIXATION PATELLA;  Surgeon: Carole Civil, MD;  Location: [facility];  Service: Orthopedics;  Laterality: Left;   SPINE SURGERY  2009   cervical disc   SPINE SURGERY  2009   lumbar   TUBAL LIGATION  1976   tumor removed from arm      Family History  Problem Relation Age of Onset   Heart disease Father        CHF   Heart disease Mother    COPD Sister    Diabetes Sister    Neuropathy Sister    COPD Sister    COPD Sister    Cancer Sister    Stroke Brother    Heart disease Brother    Colon cancer Neg Hx    Anesthesia problems Neg Hx    Hypotension Neg Hx    Malignant hyperthermia Neg Hx    Pseudochol deficiency Neg Hx    Colon polyps Neg Hx     New complaints: none today  Social history: Lives alone  Controlled substance contract: 07/11/19    Review of Systems  Constitutional: Negative for  activity change and appetite change.  HENT: Negative.   Eyes: Negative for pain.  Respiratory: Positive for cough and shortness of breath (occasional).   Cardiovascular: Negative for chest pain, palpitations and leg swelling.  Gastrointestinal: Negative for abdominal pain.  Endocrine: Negative for polydipsia.  Genitourinary: Negative.   Skin: Negative for rash.  Neurological: Negative for dizziness, weakness and headaches.  Hematological: Does not bruise/bleed easily.  Psychiatric/Behavioral: Negative.   All other systems reviewed and are negative.      Objective:   Physical Exam Vitals signs and nursing note reviewed.  Constitutional:      General: She is not in acute distress.    Appearance: Normal appearance. She is well-developed.  HENT:     Head: Normocephalic.     Nose: Nose normal.  Eyes:     Pupils: Pupils are equal, round, and reactive to light.  Neck:     Musculoskeletal: Normal range of motion and neck supple.     Vascular: No carotid bruit or JVD.  Cardiovascular:     Rate and Rhythm: Normal rate and regular rhythm.     Heart sounds: Normal heart sounds.  Pulmonary:     Effort: Pulmonary effort is normal. No respiratory distress.     Breath sounds: Rhonchi (bil) present. No wheezing or rales.  Chest:     Chest wall: No tenderness.  Abdominal:     General: Bowel sounds are normal. There is no distension or abdominal bruit.     Palpations: Abdomen is soft. There is no hepatomegaly, splenomegaly, mass or pulsatile mass.     Tenderness: There is no abdominal tenderness.  Musculoskeletal: Normal range of motion.  Lymphadenopathy:     Cervical: No cervical adenopathy.  Skin:    General: Skin is warm and dry.  Neurological:     Mental Status: She is alert and oriented to person, place, and time.     Deep Tendon Reflexes: Reflexes are normal and symmetric.  Psychiatric:        Behavior: Behavior normal.        Thought  Content: Thought content normal.         Judgment: Judgment normal.    BP 137/78    Pulse 72    Temp (!) 97.3 F (36.3 C) (Temporal)    Ht 5\' 5"  (1.651 m)    Wt 184 lb 12.8 oz (83.8 kg)    SpO2 93%    BMI 30.75 kg/m         Assessment & Plan:  NASTACIA KROUSE comes in today with chief complaint of Medical Management of Chronic Issues   Diagnosis and orders addressed:  1. Chronic midline low back pain without sciatica Moist heat Rest No heavy lifting - HYDROcodone-acetaminophen (NORCO) 10-325 MG tablet; Take 1 tablet by mouth every 8 (eight) hours as needed.  Dispense: 90 tablet; Refill: 0 - HYDROcodone-acetaminophen (NORCO) 10-325 MG tablet; Take 1 tablet by mouth every 8 (eight) hours as needed.  Dispense: 90 tablet; Refill: 0 - HYDROcodone-acetaminophen (NORCO) 10-325 MG tablet; Take 1 tablet by mouth every 8 (eight) hours as needed.  Dispense: 90 tablet; Refill: 0  2. Anxiety Stress management - ALPRAZolam (XANAX) 0.5 MG tablet; Take 1 tablet (0.5 mg total) by mouth 3 (three) times daily as needed for anxiety.  Dispense: 90 tablet; Refill: 2  3. Hyperlipidemia with target LDL less than 100 Low fat diet  4. Hypokalemia  5. Benign hypertension Low sodium diet  6. Chronic diarrhea Continue bentyl  7. Gastroesophageal reflux disease without esophagitis Avoid spicy foods Do not eat 2 hours prior to bedtime  8. Recurrent major depressive disorder, in full remission ([place]) Tress management Continue axil as rx   9. Smoker Smoking cessation encouraged  10. COPD (chronic obstructive pulmonary disease) with chronic bronchitis (HCC) Continue BREO daily  11. BMI 32.0-32.9,adult Discussed diet and exercise for person with BMI >25 Will recheck weight in 3-6 months   Labs pending Health Maintenance reviewed Diet and exercise encouraged  Follow up plan: 3 months   [name], FNP

## 2019-07-12 ENCOUNTER — Telehealth: Payer: Self-pay | Admitting: Nurse Practitioner

## 2019-07-12 LAB — CMP14+EGFR
ALT: 39 IU/L — ABNORMAL HIGH (ref 0–32)
AST: 116 IU/L — ABNORMAL HIGH (ref 0–40)
Albumin/Globulin Ratio: 2 (ref 1.2–2.2)
Albumin: 4.1 g/dL (ref 3.7–4.7)
Alkaline Phosphatase: 108 IU/L (ref 39–117)
BUN/Creatinine Ratio: 8 — ABNORMAL LOW (ref 12–28)
BUN: 5 mg/dL — ABNORMAL LOW (ref 8–27)
Bilirubin Total: 0.4 mg/dL (ref 0.0–1.2)
CO2: 29 mmol/L (ref 20–29)
Calcium: 9.9 mg/dL (ref 8.7–10.3)
Chloride: 98 mmol/L (ref 96–106)
Creatinine, Ser: 0.62 mg/dL (ref 0.57–1.00)
GFR calc Af Amer: 105 mL/min/{1.73_m2} (ref >59)
GFR calc non Af Amer: 91 mL/min/{1.73_m2} (ref >59)
Globulin, Total: 2.1 g/dL (ref 1.5–4.5)
Glucose: 87 mg/dL (ref 65–99)
Potassium: 4.4 mmol/L (ref 3.5–5.2)
Sodium: 141 mmol/L (ref 134–144)
Total Protein: 6.2 g/dL (ref 6.0–8.5)

## 2019-07-12 LAB — LIPID PANEL
Chol/HDL Ratio: 1.9 ratio (ref 0.0–4.4)
Cholesterol, Total: 149 mg/dL (ref 100–199)
HDL: 77 mg/dL (ref >39)
LDL Chol Calc (NIH): 56 mg/dL (ref 0–99)
Triglycerides: 84 mg/dL (ref 0–149)
VLDL Cholesterol Cal: 16 mg/dL (ref 5–40)

## 2019-07-16 LAB — HEPATITIS PANEL, ACUTE
Hep A IgM: NEGATIVE
Hep B C IgM: NEGATIVE
Hep C Virus Ab: 0.1 s/co ratio (ref 0.0–0.9)
Hepatitis B Surface Ag: NEGATIVE

## 2019-07-16 LAB — SPECIMEN STATUS REPORT

## 2019-07-18 ENCOUNTER — Other Ambulatory Visit: Payer: Self-pay

## 2019-07-18 ENCOUNTER — Ambulatory Visit (INDEPENDENT_AMBULATORY_CARE_PROVIDER_SITE_OTHER): Payer: Medicare Other | Admitting: *Deleted

## 2019-07-18 DIAGNOSIS — Z0000 Encounter for general adult medical examination without abnormal findings: Secondary | ICD-10-CM

## 2019-07-18 NOTE — Patient Instructions (Signed)
Preventive Care 72 Years and Older, Female Preventive care refers to lifestyle choices and visits with your health care provider that can promote health and wellness. This includes:  A yearly physical exam. This is also called an annual well check.  Regular dental and eye exams.  Immunizations.  Screening for certain conditions.  Healthy lifestyle choices, such as diet and exercise. What can I expect for my preventive care visit? Physical exam Your health care provider will check:  Height and weight. These may be used to calculate body mass index (BMI), which is a measurement that tells if you are at a healthy weight.  Heart rate and blood pressure.  Your skin for abnormal spots. Counseling Your health care provider may ask you questions about:  Alcohol, tobacco, and drug use.  Emotional well-being.  Home and relationship well-being.  Sexual activity.  Eating habits.  History of falls.  Memory and ability to understand (cognition).  Work and work Statistician.  Pregnancy and menstrual history. What immunizations do I need?  Influenza (flu) vaccine  This is recommended every year. Tetanus, diphtheria, and pertussis (Tdap) vaccine  You may need a Td booster every 10 years. Varicella (chickenpox) vaccine  You may need this vaccine if you have not already been vaccinated. Zoster (shingles) vaccine  You may need this after age 72. Pneumococcal conjugate (PCV13) vaccine  One dose is recommended after age 72. Pneumococcal polysaccharide (PPSV23) vaccine  One dose is recommended after age 72. Measles, mumps, and rubella (MMR) vaccine  You may need at least one dose of MMR if you were born in 1957 or later. You may also need a second dose. Meningococcal conjugate (MenACWY) vaccine  You may need this if you have certain conditions. Hepatitis A vaccine  You may need this if you have certain conditions or if you travel or work in places where you may be exposed  to hepatitis A. Hepatitis B vaccine  You may need this if you have certain conditions or if you travel or work in places where you may be exposed to hepatitis B. Haemophilus influenzae type b (Hib) vaccine  You may need this if you have certain conditions. You may receive vaccines as individual doses or as more than one vaccine together in one shot (combination vaccines). Talk with your health care provider about the risks and benefits of combination vaccines. What tests do I need? Blood tests  Lipid and cholesterol levels. These may be checked every 5 years, or more frequently depending on your overall health.  Hepatitis C test.  Hepatitis B test. Screening  Lung cancer screening. You may have this screening every year starting at age 72 if you have a 30-pack-year history of smoking and currently smoke or have quit within the past 15 years.  Colorectal cancer screening. All adults should have this screening starting at age 72 and continuing until age 15. Your health care provider may recommend screening at age 23 if you are at increased risk. You will have tests every 1-10 years, depending on your results and the type of screening test.  Diabetes screening. This is done by checking your blood sugar (glucose) after you have not eaten for a while (fasting). You may have this done every 1-3 years.  Mammogram. This may be done every 1-2 years. Talk with your health care provider about how often you should have regular mammograms.  BRCA-related cancer screening. This may be done if you have a family history of breast, ovarian, tubal, or peritoneal cancers.  Other tests  Sexually transmitted disease (STD) testing.  Bone density scan. This is done to screen for osteoporosis. You may have this done starting at age 72. Follow these instructions at home: Eating and drinking  Eat a diet that includes fresh fruits and vegetables, whole grains, lean protein, and low-fat dairy products. Limit  your intake of foods with high amounts of sugar, saturated fats, and salt.  Take vitamin and mineral supplements as recommended by your health care provider.  Do not drink alcohol if your health care provider tells you not to drink.  If you drink alcohol: ? Limit how much you have to 0-1 drink a day. ? Be aware of how much alcohol is in your drink. In the [place] one drink equals one 12 oz bottle of beer (355 mL), one 5 oz glass of wine (148 mL), or one 1 oz glass of hard liquor (44 mL). Lifestyle  Take daily care of your teeth and gums.  Stay active. Exercise for at least 30 minutes on 5 or more days each week.  Do not use any products that contain nicotine or tobacco, such as cigarettes, e-cigarettes, and chewing tobacco. If you need help quitting, ask your health care provider.  If you are sexually active, practice safe sex. Use a condom or other form of protection in order to prevent STIs (sexually transmitted infections).  Talk with your health care provider about taking a low-dose aspirin or statin. What's next?  Go to your health care provider once a year for a well check visit.  Ask your health care provider how often you should have your eyes and teeth checked.  Stay up to date on all vaccines. This information is not intended to replace advice given to you by your health care provider. Make sure you discuss any questions you have with your health care provider. Document Released: 10/16/2015 Document Revised: 09/13/2018 Document Reviewed: 09/13/2018 Elsevier Patient Education  2020 [employer].

## 2019-07-18 NOTE — Progress Notes (Signed)
MEDICARE ANNUAL WELLNESS VISIT  07/18/2019  Telephone Visit Disclaimer This Medicare AWV was conducted by telephone due to national recommendations for restrictions regarding the COVID-19 Pandemic (e.g. social distancing).  I verified, using two identifiers, that I am speaking with [name] or their authorized healthcare agent. I discussed the limitations, risks, security, and privacy concerns of performing an evaluation and management service by telephone and the potential availability of an in-person appointment in the future. The patient expressed understanding and agreed to proceed.   Subjective:  [name] is a 73 y.o. female patient of [name], [place] who had a Medicare Annual Wellness Visit today via telephone. [name] is Retired and lives alone. she has 5 children. she reports that she is socially active and does interact with friends/family regularly. she is minimally physically active and enjoys working in her flowers, watching TV, reading and spending time with her children and grandchildren.  Patient Care Team: Sharon Pretty, FNP as PCP - General (Nurse Practitioner) Daneil Dolin, MD (Gastroenterology)  Advanced Directives 07/18/2019 01/31/2018 11/24/2017 11/20/2017 11/19/2017 10/19/2016 10/16/2016  Does Patient Have a Medical Advance Directive? No Yes No No No No No  Type of Advance Directive - Living will - - - - -  Does patient want to make changes to medical advance directive? - No - Patient declined - - - - -  Would patient like information on creating a medical advance directive? No - Patient declined - No - Patient declined No - Patient declined No - Patient declined No - Patient declined No - Patient declined  Pre-existing out of facility DNR order (yellow form or pink MOST form) - - - - - - -    Hospital Utilization Over the Past 12 Months: # of hospitalizations or ER visits: 0 # of surgeries: 0  Review of Systems    Patient  reports that her overall health is worse compared to last year.  History obtained from chart review  Patient Reported Readings (BP, Pulse, CBG, Weight, etc) none  Pain Assessment Pain : No/denies pain     Current Medications & Allergies (verified) Allergies as of 07/18/2019      Reactions   Meloxicam Other (See Comments)   Stomach ulcers      Medication List       Accurate as of July 18, 2019  2:16 PM. If you have any questions, ask your nurse or doctor.        albuterol 108 (90 Base) MCG/ACT inhaler Commonly known as: ProAir HFA Inhale 2 puffs by mouth every 6 hours as needed for wheezing or shortness of breath   ALPRAZolam 0.5 MG tablet Commonly known as: XANAX Take 1 tablet (0.5 mg total) by mouth 3 (three) times daily as needed for anxiety.   aspirin-sod bicarb-citric acid 325 MG Tbef tablet Commonly known as: ALKA-SELTZER Take 325 mg by mouth every 6 (six) hours as needed (cold/congestion).   Breo Ellipta 200-25 MCG/INH Aepb Generic drug: fluticasone furoate-vilanterol Inhale 1 puff into the lungs daily.   calcium carbonate 600 MG Tabs tablet Commonly known as: OS-CAL Take 600 mg by mouth daily.   Compressor/Nebulizer Misc Please use with nebulizer solution every 6 hours as needed for shortness of breath or wheezing.   dicyclomine 10 MG capsule Commonly known as: BENTYL Take 1 capsule (10 mg total) by mouth 4 (four) times daily as needed for spasms.   diphenhydrAMINE 25 MG tablet Commonly known as: BENADRYL Take 25 mg by  mouth 2 (two) times daily as needed. For Allergies   fluticasone 50 MCG/ACT nasal spray Commonly known as: FLONASE SPRAY 2 SPRAYS INTO EACH NOSTRIL EVERY DAY   Goodys Extra Strength 500-325-65 MG Pack Generic drug: Aspirin-Acetaminophen-Caffeine Take 1 packet by mouth every 8 (eight) hours as needed (for pain.).   HYDROcodone-acetaminophen 10-325 MG tablet Commonly known as: NORCO Take 1 tablet by mouth every 8 (eight) hours  as needed.   HYDROcodone-acetaminophen 10-325 MG tablet Commonly known as: NORCO Take 1 tablet by mouth every 8 (eight) hours as needed. Start taking on: August 14, 2019   HYDROcodone-acetaminophen 10-325 MG tablet Commonly known as: NORCO Take 1 tablet by mouth every 8 (eight) hours as needed. Start taking on: September 13, 2019   ipratropium 0.02 % nebulizer solution Commonly known as: ATROVENT Take 2.5 mLs (0.5 mg total) by nebulization every 6 (six) hours as needed for wheezing or shortness of breath.   levalbuterol 1.25 MG/0.5ML nebulizer solution Commonly known as: XOPENEX Take 1.25 mg by nebulization every 6 (six) hours as needed for wheezing or shortness of breath.   lisinopril-hydrochlorothiazide 10-12.5 MG tablet Commonly known as: ZESTORETIC Take 1 tablet by mouth daily.   multivitamin with minerals Tabs tablet Take 1 tablet by mouth daily.   pantoprazole 40 MG tablet Commonly known as: PROTONIX Take 1 tablet (40 mg total) by mouth 2 (two) times daily.   PARoxetine 20 MG tablet Commonly known as: PAXIL Take 1 tablet (20 mg total) by mouth daily.   simvastatin 40 MG tablet Commonly known as: ZOCOR Take 1 tablet (40 mg total) by mouth at bedtime.   vitamin C 500 MG tablet Commonly known as: ASCORBIC ACID Take 1,000 mg by mouth daily.   Vitamin D3 25 MCG (1000 UT) Caps Take 3,000 mg by mouth daily.       History (reviewed): Past Medical History:  Diagnosis Date  . Angina    with indigestion  . Anxiety   . Arthritis    all over esp. in joints  . Complication of anesthesia   . COPD (chronic obstructive pulmonary disease) ([place])   . Depression   . Family history of adverse reaction to anesthesia   . GERD (gastroesophageal reflux disease)   . Headache(784.0)    normal h/a  . HPV in female   . HTN (hypertension)   . Hypercholesteremia   . Hypercholesterolemia   . IBS (irritable bowel syndrome)   . Pneumonia 2007  . PONV (postoperative nausea  and vomiting)   . S/P colonoscopy 05/30/2008   Dr. Deatra Ina: (records reviewed by AS, NP) findings normal to cecum, scattered diverticula in sigmoid colon   . S/P endoscopy 05/15/2008   Dr. Deatra Ina: (records reviewed by AS,NP): [facility] dilator for stricture distal esophagus, also noted duodenal stricture, op notes state unable to pass 9mm scope through pylorus  . Shortness of breath    with exertion  . Stomach ulcer    Past Surgical History:  Procedure Laterality Date  . APPENDECTOMY     73 years old  . BACK SURGERY     X 2  . BIOPSY  08/11/2016   Procedure: BIOPSY;  Surgeon: Daneil Dolin, MD;  [facility];  Service: Endoscopy;;  gastric bx's  . BREAST SURGERY     biopsy only  . CARPAL TUNNEL RELEASE     right hand  . CERVICAL BIOPSY  W/ LOOP ELECTRODE EXCISION  02/2011   CIN-2 with focal ectocervical margin involvement. With CIN-1 ECC  negative  . CHOLECYSTECTOMY  1984  . ESOPHAGOGASTRODUODENOSCOPY  12/01/2011   Hiatal hernia/Mild fibrotic pyloric stenosis status post dilation with passage of  the scope.  The previously noted gastric ulcer completely healed  The remainder of the gastric mucosa appeared normal  . ESOPHAGOGASTRODUODENOSCOPY (EGD) WITH PROPOFOL N/A 08/11/2016   Procedure: ESOPHAGOGASTRODUODENOSCOPY (EGD) WITH PROPOFOL;  Surgeon: Daneil Dolin, MD;  [facility];  Service: Endoscopy;  Laterality: N/A;  2:15 PM  . FRACTURE SURGERY  10/18/2016   left knee in 3 places  . MALONEY DILATION  07/28/2011   Schatzki's ring s/p 36F with small UES tear as well, small hh, 1cm pyloric channel ulcer, bx benign without H.Pylori. Mobic/goody's at time.  Marland Kitchen MALONEY DILATION N/A 08/11/2016   Procedure: Venia Minks DILATION;  Surgeon: Daneil Dolin, MD;  [facility];  Service: Endoscopy;  Laterality: N/A;  . ORIF PATELLA Left 10/19/2016   Procedure: OPEN REDUCTION INTERNAL (ORIF) FIXATION PATELLA;  Surgeon: Carole Civil, MD;  Location: [facility];  Service:  Orthopedics;  Laterality: Left;  . SPINE SURGERY  2009   cervical disc  . SPINE SURGERY  2009   lumbar  . TUBAL LIGATION  1976  . tumor removed from arm     Family History  Problem Relation Age of Onset  . Heart disease Father        CHF  . Heart disease Mother   . COPD Sister   . Diabetes Sister   . Neuropathy Sister   . COPD Sister   . COPD Sister   . Cancer Sister   . Stroke Brother   . Heart disease Brother   . Colon cancer Neg Hx   . Anesthesia problems Neg Hx   . Hypotension Neg Hx   . Malignant hyperthermia Neg Hx   . Pseudochol deficiency Neg Hx   . Colon polyps Neg Hx    Social History   Socioeconomic History  . Marital status: Widowed    Spouse name: Not on file  . Number of children: 5  . Years of education: 9  . Highest education level: 9th grade  Occupational History  . Occupation: disability    Employer: DISABILITY    Employer: [employer]  . Financial resource strain: Not hard at all  . Food insecurity    Worry: Never true    Inability: Never true  . Transportation needs    Medical: No    Non-medical: No  Tobacco Use  . Smoking status: Current Every Day Smoker    Packs/day: 1.00    Years: 50.00    Pack years: 50.00    Types: Cigarettes  . Smokeless tobacco: Never Used  . Tobacco comment: since teenager  Substance and Sexual Activity  . Alcohol use: No    Comment: hx of ETOH abuse in past, none in 10 years  . Drug use: No  . Sexual activity: Not Currently    Birth control/protection: Surgical  Lifestyle  . Physical activity    Days per week: 0 days    Minutes per session: 0 min  . Stress: To some extent  Relationships  . Social connections    Talks on phone: More than three times a week    Gets together: More than three times a week    Attends religious service: More than 4 times per year    Active member of club or organization: No    Attends meetings of clubs or organizations:  Never    Relationship status:  Widowed  Other Topics Concern  . Not on file  Social History Narrative   Husband passed away 2015/05/12 from lung cancer/leukemia.     Activities of Daily Living In your present state of health, do you have any difficulty performing the following activities: 07/18/2019  Hearing? Y  Comment due to working in [employer] most of her life  Vision? N  Comment wears glasses-gets yearly eye exam  Difficulty concentrating or making decisions? N  Walking or climbing stairs? N  Comment pt does fine on level ground, and is ok with stairs as long as there are hand rails  Dressing or bathing? N  Doing errands, shopping? N  Preparing Food and eating ? N  Using the Toilet? N  In the past six months, have you accidently leaked urine? N  Do you have problems with loss of bowel control? N  Managing your Medications? N  Managing your Finances? N  Housekeeping or managing your Housekeeping? N  Some recent data might be hidden    Patient Education/ Literacy How often do you need to have someone help you when you read instructions, pamphlets, or other written materials from your doctor or pharmacy?: 1 - Never What is the last grade level you completed in school?: 9th grade  Exercise Current Exercise Habits: The patient does not participate in regular exercise at present, Exercise limited by: respiratory conditions(s);orthopedic condition(s)  Diet Patient reports consuming 2 meals a day and 3 snack(s) a day Patient reports that her primary diet is: Regular Patient reports that she does have regular access to food.   Depression Screen PHQ 2/9 Scores 07/18/2019 07/11/2019 12-May-2019 01/15/2019 07/12/2018 04/13/2018 01/31/2018  PHQ - 2 Score 2 0 2 2 0 0 0  PHQ- 9 Score 4 - 7 - - - -  Exception Documentation - - - - - - -     Fall Risk Fall Risk  07/18/2019 07/11/2019 07/12/2018 04/13/2018 01/31/2018  Falls in the past year? 0 0 No No No  Number falls in past yr: 0 - - - -  Injury with Fall? 0 - - - -   Comment - - - - -  Follow up Falls prevention discussed - - - -  Comment Get rid of all throw rugs in the house, adequate lighting in the walkways and grab bars in the bathroom - - - -     Objective:  [name] seemed alert and oriented and she participated appropriately during our telephone visit.  Blood Pressure Weight BMI  BP Readings from Last 3 Encounters:  07/11/19 137/78  10/16/18 124/70  07/12/18 124/79   Wt Readings from Last 3 Encounters:  07/11/19 184 lb 12.8 oz (83.8 kg)  10/16/18 176 lb (79.8 kg)  07/12/18 178 lb (80.7 kg)   BMI Readings from Last 1 Encounters:  07/11/19 30.75 kg/m    *Unable to obtain current vital signs, weight, and BMI due to telephone visit type  Hearing/Vision  . [place] did not seem to have difficulty with hearing/understanding during the telephone conversation . Reports that she has not had a formal eye exam by an eye care professional within the past year . Reports that she has not had a formal hearing evaluation within the past year *Unable to fully assess hearing and vision during telephone visit type  Cognitive Function: 6CIT Screen 07/18/2019  What Year? 4 points  What month? 0 points  What time? 0 points  Count back from 20 0 points  Months in reverse 2 points  Repeat phrase 0 points  Total Score 6   (Normal:0-7, Significant for Dysfunction: >8)  Normal Cognitive Function Screening: Yes   Immunization & Health Maintenance Record Immunization History  Administered Date(s) Administered  . Influenza Split 11/27/2012  . Influenza, High Dose Seasonal PF 07/01/2014, 08/12/2015, 07/12/2017, 07/12/2018  . Influenza,inj,Quad PF,6+ Mos 06/24/2013  . Influenza-Unspecified 07/29/2009, 07/22/2010, 09/04/2015  . Pneumococcal Conjugate-13 06/24/2014, 09/04/2015  . Pneumococcal Polysaccharide-23 11/27/2012, 08/12/2015  . Tdap 01/26/2017    Health Maintenance  Topic Date Due  . COLONOSCOPY  05/30/2018  . INFLUENZA VACCINE   01/01/2020 (Originally 05/04/2019)  . MAMMOGRAM  04/10/2020  . TETANUS/TDAP  01/27/2027  . DEXA SCAN  Completed  . Hepatitis C Screening  Completed  . PNA vac Low Risk Adult  Completed       Assessment  This is a routine wellness examination for [employer].  Health Maintenance: Due or Overdue Health Maintenance Due  Topic Date Due  . COLONOSCOPY  05/30/2018    [name] does not need a referral for Community Assistance: Care Management:   no Social Work:    no Prescription Assistance:  no Nutrition/Diabetes Education:  no   Plan:  Personalized Goals Goals Addressed            This Visit's Progress   . DIET - INCREASE WATER INTAKE       Try to drink 6-8 glasses of water daily.      Personalized Health Maintenance & Screening Recommendations  Influenza vaccine Colorectal cancer screening Shingles vaccine Pt declines referral for GI to have colonoscopy  Lung Cancer Screening Recommended: yes-pt declined at this time (Low Dose CT Chest recommended if Age 75-80 years, 30 pack-year currently smoking OR have quit w/in past 15 years) Hepatitis C Screening recommended: no HIV Screening recommended: no  Advanced Directives: Written information was not prepared per patient's request.  Referrals & Orders No orders of the defined types were placed in this encounter.   Follow-up Plan . Follow-up with Sharon Pretty, FNP as planned . Consider Flu and Shingles vaccines at your next visit with your PCP   I have personally reviewed and noted the following in the patient's chart:   . Medical and social history . Use of alcohol, tobacco or illicit drugs  . Current medications and supplements . Functional ability and status . Nutritional status . Physical activity . Advanced directives . List of other physicians . Hospitalizations, surgeries, and ER visits in previous 12 months . Vitals . Screenings to include cognitive, depression, and falls .  Referrals and appointments  In addition, I have reviewed and discussed with [name] certain preventive protocols, quality metrics, and best practice recommendations. A written personalized care plan for preventive services as well as general preventive health recommendations is available and can be mailed to the patient at her request.      Milas Hock, LPN  [id number]

## 2019-07-23 NOTE — Telephone Encounter (Signed)
Patient aware of results on 07/16/2019.

## 2019-08-27 ENCOUNTER — Encounter: Payer: Self-pay | Admitting: Nurse Practitioner

## 2019-08-27 ENCOUNTER — Ambulatory Visit (INDEPENDENT_AMBULATORY_CARE_PROVIDER_SITE_OTHER): Payer: Medicare Other | Admitting: Nurse Practitioner

## 2019-08-27 ENCOUNTER — Other Ambulatory Visit: Payer: Self-pay | Admitting: *Deleted

## 2019-08-27 DIAGNOSIS — J441 Chronic obstructive pulmonary disease with (acute) exacerbation: Secondary | ICD-10-CM

## 2019-08-27 DIAGNOSIS — F17200 Nicotine dependence, unspecified, uncomplicated: Secondary | ICD-10-CM | POA: Diagnosis not present

## 2019-08-27 DIAGNOSIS — Z20822 Contact with and (suspected) exposure to covid-19: Secondary | ICD-10-CM

## 2019-08-27 MED ORDER — BENZONATATE 100 MG PO CAPS: 100.0000 mg | ORAL_CAPSULE | Freq: Three times a day (TID) | ORAL | 0 refills | Status: DC | PRN

## 2019-08-27 MED ORDER — PREDNISONE 20 MG PO TABS: ORAL_TABLET | ORAL | 0 refills | Status: DC

## 2019-08-27 NOTE — Progress Notes (Signed)
   Virtual Visit via telephone Note Due to COVID-19 pandemic this visit was conducted virtually. This visit type was conducted due to national recommendations for restrictions regarding the COVID-19 Pandemic (e.g. social distancing, sheltering in place) in an effort to limit this patient's exposure and mitigate transmission in our community. All issues noted in this document were discussed and addressed.  A physical exam was not performed with this format.  I connected with [name] on 08/27/19 at 7:55 by telephone and verified that I am speaking with the correct person using two identifiers. [name] is currently located at home and  No one is currently with her during visit. The provider, [name], FNP is located in their office at time of visit.  I discussed the limitations, risks, security and privacy concerns of performing an evaluation and management service by telephone and the availability of in person appointments. I also discussed with the patient that there may be a patient responsible charge related to this service. The patient expressed understanding and agreed to proceed.   History and Present Illness:    Chief Complaint: Cough and COPD   HPI Patient calls in c/o constant cough that has gotten really bad the last 2 days. She has severe COPD and is still a smoker. She has slight congestion. She denies SOB, fever or body aches.  Review of Systems  Constitutional: Negative.  Negative for chills and fever.  HENT: Negative.   Respiratory: Positive for cough and shortness of breath (on exertion).   Musculoskeletal: Negative for myalgias.  Skin: Negative.   Neurological: Negative for headaches.  Psychiatric/Behavioral: Negative.   All other systems reviewed and are negative.    Observations/Objective: Alert and oriented- answers all questions appropriately No distress Deep wet cough 02 sat running 95% room air  Assessment and Plan: [name] in today with chief complaint of Cough and COPD   1. Smoker Smoking cessation encourgaed  2. COPD exacerbation ([place]) Use your oxygen as needed - predniSONE (DELTASONE) 20 MG tablet; 2 po at sametime daily for 5 days  Dispense: 10 tablet; Refill: 0 - benzonatate (TESSALON PERLES) 100 MG capsule; Take 1 capsule (100 mg total) by mouth 3 (three) times daily as needed for cough.  Dispense: 20 capsule; Refill: 0 If worsens will need to be tested for coivd  Follow Up Instructions: As scheduled    I discussed the assessment and treatment plan with the patient. The patient was provided an opportunity to ask questions and all were answered. The patient agreed with the plan and demonstrated an understanding of the instructions.   The patient was advised to call back or seek an in-person evaluation if the symptoms worsen or if the condition fails to improve as anticipated.  The above assessment and management plan was discussed with the patient. The patient verbalized understanding of and has agreed to the management plan. Patient is aware to call the clinic if symptoms persist or worsen. Patient is aware when to return to the clinic for a follow-up visit. Patient educated on when it is appropriate to go to the emergency department.   Time call ended:  8:10  I provided 15 minutes of non-face-to-face time during this encounter.    [name], FNP

## 2019-08-28 ENCOUNTER — Telehealth: Payer: Self-pay | Admitting: *Deleted

## 2019-08-28 LAB — NOVEL CORONAVIRUS, NAA: SARS-CoV-2, NAA: NOT DETECTED

## 2019-08-28 NOTE — Telephone Encounter (Signed)
Patient called and was given negative covid results . 

## 2019-09-12 ENCOUNTER — Other Ambulatory Visit: Payer: Self-pay

## 2019-09-12 NOTE — Patient Outreach (Signed)
[facility]) Care Management  09/12/2019  [name] May 30, 1946 [id number]   Medication Adherence call to [name] [place] Identifiers Verify spoke with patient she is past due on Simvastatin 40 mg,patient explain doctor ask patient to stop the medication,patient will have blood work done on her next appointment and will know if she need to keeping taking it. [name] is showing past due under [place].  [facility] Direct Dial [phone]  Fax [phone] [name]

## 2019-10-03 ENCOUNTER — Other Ambulatory Visit: Payer: Self-pay | Admitting: Nurse Practitioner

## 2019-10-03 DIAGNOSIS — F419 Anxiety disorder, unspecified: Secondary | ICD-10-CM

## 2019-10-07 ENCOUNTER — Other Ambulatory Visit: Payer: Self-pay | Admitting: Nurse Practitioner

## 2019-10-07 ENCOUNTER — Other Ambulatory Visit: Payer: Medicare Other

## 2019-10-07 ENCOUNTER — Other Ambulatory Visit: Payer: Self-pay

## 2019-10-07 DIAGNOSIS — K219 Gastro-esophageal reflux disease without esophagitis: Secondary | ICD-10-CM

## 2019-10-07 DIAGNOSIS — F419 Anxiety disorder, unspecified: Secondary | ICD-10-CM

## 2019-10-07 DIAGNOSIS — E785 Hyperlipidemia, unspecified: Secondary | ICD-10-CM

## 2019-10-08 ENCOUNTER — Telehealth: Payer: Self-pay | Admitting: Nurse Practitioner

## 2019-10-08 DIAGNOSIS — F419 Anxiety disorder, unspecified: Secondary | ICD-10-CM

## 2019-10-08 LAB — HEPATIC FUNCTION PANEL
ALT: 15 IU/L (ref 0–32)
AST: 33 IU/L (ref 0–40)
Albumin: 4.3 g/dL (ref 3.7–4.7)
Alkaline Phosphatase: 97 IU/L (ref 39–117)
Bilirubin Total: 0.3 mg/dL (ref 0.0–1.2)
Bilirubin, Direct: 0.13 mg/dL (ref 0.00–0.40)
Total Protein: 6.3 g/dL (ref 6.0–8.5)

## 2019-10-08 MED ORDER — ALPRAZOLAM 0.5 MG PO TABS: 0.5000 mg | ORAL_TABLET | Freq: Three times a day (TID) | ORAL | 0 refills | Status: DC | PRN

## 2019-10-08 NOTE — Telephone Encounter (Signed)
Patient has appointment Thursday. Runs out of xanax today. She states she needs them called now

## 2019-10-10 ENCOUNTER — Ambulatory Visit (INDEPENDENT_AMBULATORY_CARE_PROVIDER_SITE_OTHER): Payer: Medicare Other | Admitting: Nurse Practitioner

## 2019-10-10 ENCOUNTER — Other Ambulatory Visit: Payer: Self-pay

## 2019-10-10 ENCOUNTER — Encounter: Payer: Self-pay | Admitting: Nurse Practitioner

## 2019-10-10 VITALS — BP 141/76 | HR 87 | Temp 98.0°F | Resp 20 | Ht 65.0 in | Wt 187.0 lb

## 2019-10-10 DIAGNOSIS — F3342 Major depressive disorder, recurrent, in full remission: Secondary | ICD-10-CM

## 2019-10-10 DIAGNOSIS — G8929 Other chronic pain: Secondary | ICD-10-CM

## 2019-10-10 DIAGNOSIS — F17200 Nicotine dependence, unspecified, uncomplicated: Secondary | ICD-10-CM

## 2019-10-10 DIAGNOSIS — F419 Anxiety disorder, unspecified: Secondary | ICD-10-CM

## 2019-10-10 DIAGNOSIS — I700 Atherosclerosis of aorta: Secondary | ICD-10-CM

## 2019-10-10 DIAGNOSIS — Z6832 Body mass index (BMI) 32.0-32.9, adult: Secondary | ICD-10-CM

## 2019-10-10 DIAGNOSIS — K219 Gastro-esophageal reflux disease without esophagitis: Secondary | ICD-10-CM

## 2019-10-10 DIAGNOSIS — E876 Hypokalemia: Secondary | ICD-10-CM

## 2019-10-10 DIAGNOSIS — I10 Essential (primary) hypertension: Secondary | ICD-10-CM

## 2019-10-10 DIAGNOSIS — M545 Low back pain: Secondary | ICD-10-CM

## 2019-10-10 DIAGNOSIS — E785 Hyperlipidemia, unspecified: Secondary | ICD-10-CM

## 2019-10-10 DIAGNOSIS — Z23 Encounter for immunization: Secondary | ICD-10-CM | POA: Diagnosis not present

## 2019-10-10 DIAGNOSIS — J449 Chronic obstructive pulmonary disease, unspecified: Secondary | ICD-10-CM

## 2019-10-10 MED ORDER — PANTOPRAZOLE SODIUM 40 MG PO TBEC: 40.0000 mg | DELAYED_RELEASE_TABLET | Freq: Two times a day (BID) | ORAL | 1 refills | Status: DC

## 2019-10-10 MED ORDER — HYDROCODONE-ACETAMINOPHEN 10-325 MG PO TABS: 1.0000 | ORAL_TABLET | Freq: Three times a day (TID) | ORAL | 0 refills | Status: DC | PRN

## 2019-10-10 MED ORDER — LISINOPRIL-HYDROCHLOROTHIAZIDE 10-12.5 MG PO TABS: 1.0000 | ORAL_TABLET | Freq: Every day | ORAL | 1 refills | Status: DC

## 2019-10-10 MED ORDER — PAROXETINE HCL 20 MG PO TABS: 20.0000 mg | ORAL_TABLET | Freq: Every day | ORAL | 1 refills | Status: DC

## 2019-10-10 MED ORDER — BREO ELLIPTA 200-25 MCG/INH IN AEPB: 1.0000 | INHALATION_SPRAY | Freq: Every day | RESPIRATORY_TRACT | 5 refills | Status: DC

## 2019-10-10 MED ORDER — ALPRAZOLAM 0.5 MG PO TABS: 0.5000 mg | ORAL_TABLET | Freq: Three times a day (TID) | ORAL | 2 refills | Status: DC | PRN

## 2019-10-10 NOTE — Progress Notes (Signed)
Subjective:    Patient ID: [name], female    DOB: 07/29/1946, 74 y.o.   MRN: [id number]   Chief Complaint: medical management of chronic issues   HPI:  1. Benign hypertension No c/o chest pain, sob, or headche. Does not check blood pressure at home. BP Readings from Last 3 Encounters:  07/11/19 137/78  10/16/18 124/70  07/12/18 124/79     2. Hyperlipidemia with target LDL less than 100 Does not watch diet and does very little if any exercise. Lab Results  Component Value Date   CHOL 149 07/11/2019   HDL 77 07/11/2019   LDLCALC 56 07/11/2019   TRIG 84 07/11/2019   CHOLHDL 1.9 07/11/2019      3. Hypokalemia No c/o lower ext cramping. Is on daily potassium supplement Lab Results  Component Value Date   K 4.4 07/11/2019     4. Aortic atherosclerosis ([place]) seenon chest xray- does not want to see cardiology  5. COPD (chronic obstructive pulmonary disease) with chronic bronchitis (HCC) Has chronic cough nad uses inhalers daily. Has not needed albuterol as of late.  6. Smoker Still smokes over apack an day and refuses to stop.  7. Gastroesophageal reflux disease without esophagitis Is on daily protonix to keep symptoms from flaring up.  8. Anxiety She is a very anxious person. He is on xnx TID and I have tried weaning her off of meds to no avail. She has been taking this for more then 20 years. GAD 7 : Generalized Anxiety Score 10/10/2019 07/11/2019 04/01/2016  Nervous, Anxious, on Edge 2 1 1   Control/stop worrying 2 1 0  Worry too much - different things 2 1 0  Trouble relaxing 1 0 0  Restless 1 0 0  Easily annoyed or irritable 0 0 1  Afraid - awful might happen 0 0 0  Total GAD 7 Score 8 3 2   Anxiety Difficulty Not difficult at all Somewhat difficult Not difficult at all     9. Recurrent major depressive disorder, in full remission ([place]) Take slexapro dialy and says she has good days and bad days, she say scovid has not helped any because she  stays cooped up in her house for days.  Depression screen [facility] 2/9 10/10/2019 07/18/2019 07/11/2019  Decreased Interest 0 0 0  Down, Depressed, Hopeless 0 2 0  PHQ - 2 Score 0 2 0  Altered sleeping - 1 -  Tired, decreased energy - 1 -  Change in appetite - 0 -  Feeling bad or failure about yourself  - 0 -  Trouble concentrating - 0 -  Moving slowly or fidgety/restless - 0 -  Suicidal thoughts - 0 -  PHQ-9 Score - 4 -  Difficult doing work/chores - Somewhat difficult -  Some recent data might be hidden     10. Chronic midline low back pain without sciatica Pain assessment: Cause of pain- back surgery X2 Pain location- low back  Pain on scale of 1-10- 5/10 currenlty Frequency- daily What increases pain-nothing What makes pain Better-pain meds help Effects on ADL - none Any change in general medical condition-none  Current opioids rx- norco 10/325 TID # meds rx- 90 Effectiveness of current meds-helps make pain tolerable Adverse reactions form pain meds-none Morphine equivalent- 30MEDD  Pill count performed-No Last drug screen - 04/16/19 ( high risk q58m, moderate risk q77m, low risk yearly ) Urine drug screen today- No Was the [place] reviewed- yes  If yes were their any concerning  findings? - no    Pain contract signed on: 07/15/19   11. BMI 32.0-32.9,adult No recent weight changes Wt Readings from Last 3 Encounters:  07/11/19 184 lb 12.8 oz (83.8 kg)  10/16/18 176 lb (79.8 kg)  07/12/18 178 lb (80.7 kg)   BMI Readings from Last 3 Encounters:  07/11/19 30.75 kg/m  10/16/18 29.29 kg/m  07/12/18 29.62 kg/m       Outpatient Encounter Medications as of 10/10/2019  Medication Sig  . albuterol (PROAIR HFA) 108 (90 Base) MCG/ACT inhaler Inhale 2 puffs by mouth every 6 hours as needed for wheezing or shortness of breath  . ALPRAZolam (XANAX) 0.5 MG tablet Take 1 tablet (0.5 mg total) by mouth 3 (three) times daily as needed for anxiety.  .  Aspirin-Acetaminophen-Caffeine (GOODYS EXTRA STRENGTH) 500-325-65 MG PACK Take 1 packet by mouth every 8 (eight) hours as needed (for pain.).  Marland Kitchen aspirin-sod bicarb-citric acid (ALKA-SELTZER) 325 MG TBEF tablet Take 325 mg by mouth every 6 (six) hours as needed (cold/congestion).  . benzonatate (TESSALON PERLES) 100 MG capsule Take 1 capsule (100 mg total) by mouth 3 (three) times daily as needed for cough.  . calcium carbonate (OS-CAL) 600 MG TABS Take 600 mg by mouth daily.   . Cholecalciferol (VITAMIN D3) 1000 UNITS CAPS Take 3,000 mg by mouth daily.   Marland Kitchen dicyclomine (BENTYL) 10 MG capsule Take 1 capsule (10 mg total) by mouth 4 (four) times daily as needed for spasms.  . diphenhydrAMINE (BENADRYL) 25 MG tablet Take 25 mg by mouth 2 (two) times daily as needed. For Allergies  . fluticasone (FLONASE) 50 MCG/ACT nasal spray SPRAY 2 SPRAYS INTO EACH NOSTRIL EVERY DAY  . fluticasone furoate-vilanterol (BREO ELLIPTA) 200-25 MCG/INH AEPB Inhale 1 puff into the lungs daily.  Marland Kitchen HYDROcodone-acetaminophen (NORCO) 10-325 MG tablet Take 1 tablet by mouth every 8 (eight) hours as needed.  Marland Kitchen HYDROcodone-acetaminophen (NORCO) 10-325 MG tablet Take 1 tablet by mouth every 8 (eight) hours as needed.  Marland Kitchen HYDROcodone-acetaminophen (NORCO) 10-325 MG tablet Take 1 tablet by mouth every 8 (eight) hours as needed.  Marland Kitchen ipratropium (ATROVENT) 0.02 % nebulizer solution Take 2.5 mLs (0.5 mg total) by nebulization every 6 (six) hours as needed for wheezing or shortness of breath.  . levalbuterol (XOPENEX) 1.25 MG/0.5ML nebulizer solution Take 1.25 mg by nebulization every 6 (six) hours as needed for wheezing or shortness of breath.  . lisinopril-hydrochlorothiazide (ZESTORETIC) 10-12.5 MG tablet Take 1 tablet by mouth daily.  . Multiple Vitamin (MULITIVITAMIN WITH MINERALS) TABS Take 1 tablet by mouth daily.  . Nebulizers (COMPRESSOR/NEBULIZER) MISC Please use with nebulizer solution every 6 hours as needed for shortness of  breath or wheezing.  . pantoprazole (PROTONIX) 40 MG tablet TAKE 1 TABLET BY MOUTH TWICE A DAY  . PARoxetine (PAXIL) 20 MG tablet Take 1 tablet (20 mg total) by mouth daily.  . predniSONE (DELTASONE) 20 MG tablet 2 po at sametime daily for 5 days  . simvastatin (ZOCOR) 40 MG tablet Take 1 tablet (40 mg total) by mouth at bedtime. (Patient not taking: Reported on 07/18/2019)  . vitamin C (ASCORBIC ACID) 500 MG tablet Take 1,000 mg by mouth daily.     Past Surgical History:  Procedure Laterality Date  . APPENDECTOMY     74 years old  . BACK SURGERY     X 2  . BIOPSY  08/11/2016   Procedure: BIOPSY;  Surgeon: Daneil Dolin, MD;  Location: [facility];  Service: Endoscopy;;  gastric  bx's  . BREAST SURGERY     biopsy only  . CARPAL TUNNEL RELEASE     right hand  . CERVICAL BIOPSY  W/ LOOP ELECTRODE EXCISION  02/2011   CIN-2 with focal ectocervical margin involvement. With CIN-1 ECC negative  . CHOLECYSTECTOMY  1984  . ESOPHAGOGASTRODUODENOSCOPY  12/01/2011   Hiatal hernia/Mild fibrotic pyloric stenosis status post dilation with passage of  the scope.  The previously noted gastric ulcer completely healed  The remainder of the gastric mucosa appeared normal  . ESOPHAGOGASTRODUODENOSCOPY (EGD) WITH PROPOFOL N/A 08/11/2016   Procedure: ESOPHAGOGASTRODUODENOSCOPY (EGD) WITH PROPOFOL;  Surgeon: Daneil Dolin, MD;  Location: [facility];  Service: Endoscopy;  Laterality: N/A;  2:15 PM  . FRACTURE SURGERY  10/18/2016   left knee in 3 places  . MALONEY DILATION  07/28/2011   Schatzki's ring s/p 66F with small UES tear as well, small hh, 1cm pyloric channel ulcer, bx benign without H.Pylori. Mobic/goody's at time.  Marland Kitchen MALONEY DILATION N/A 08/11/2016   Procedure: Venia Minks DILATION;  Surgeon: Daneil Dolin, MD;  Location: [facility];  Service: Endoscopy;  Laterality: N/A;  . ORIF PATELLA Left 10/19/2016   Procedure: OPEN REDUCTION INTERNAL (ORIF) FIXATION PATELLA;  Surgeon: Carole Civil,  MD;  Location: [facility];  Service: Orthopedics;  Laterality: Left;  . SPINE SURGERY  2009   cervical disc  . SPINE SURGERY  2009   lumbar  . TUBAL LIGATION  1976  . tumor removed from arm      Family History  Problem Relation Age of Onset  . Heart disease Father        CHF  . Heart disease Mother   . COPD Sister   . Diabetes Sister   . Neuropathy Sister   . COPD Sister   . COPD Sister   . Cancer Sister   . Stroke Brother   . Heart disease Brother   . Colon cancer Neg Hx   . Anesthesia problems Neg Hx   . Hypotension Neg Hx   . Malignant hyperthermia Neg Hx   . Pseudochol deficiency Neg Hx   . Colon polyps Neg Hx     New complaints: None today  Social history: Lives by herself      Review of Systems  Constitutional: Negative for diaphoresis.  Eyes: Negative for pain.  Respiratory: Negative for shortness of breath.   Cardiovascular: Negative for chest pain, palpitations and leg swelling.  Gastrointestinal: Negative for abdominal pain.  Endocrine: Negative for polydipsia.  Skin: Negative for rash.  Neurological: Negative for dizziness, weakness and headaches.  Hematological: Does not bruise/bleed easily.  All other systems reviewed and are negative.      Objective:   Physical Exam Vitals and nursing note reviewed.  Constitutional:      General: She is not in acute distress.    Appearance: Normal appearance. She is well-developed.  HENT:     Head: Normocephalic.     Nose: Nose normal.  Eyes:     Pupils: Pupils are equal, round, and reactive to light.  Neck:     Vascular: No carotid bruit or JVD.  Cardiovascular:     Rate and Rhythm: Normal rate and regular rhythm.     Heart sounds: Normal heart sounds.  Pulmonary:     Effort: Pulmonary effort is normal. No respiratory distress.     Breath sounds: Normal breath sounds. No wheezing or rales.  Chest:     Chest wall: No tenderness.  Abdominal:     General: Bowel sounds are normal. There is no  distension or abdominal bruit.     Palpations: Abdomen is soft. There is no hepatomegaly, splenomegaly, mass or pulsatile mass.     Tenderness: There is no abdominal tenderness.  Musculoskeletal:        General: Normal range of motion.     Cervical back: Normal range of motion and neck supple.     Comments: FROM of lumbar spine with pain on flexion and extension (-) SLR bil Motor strength and sensation intact bil  Lymphadenopathy:     Cervical: No cervical adenopathy.  Skin:    General: Skin is warm and dry.  Neurological:     Mental Status: She is alert and oriented to person, place, and time.     Deep Tendon Reflexes: Reflexes are normal and symmetric.  Psychiatric:        Behavior: Behavior normal.        Thought Content: Thought content normal.        Judgment: Judgment normal.     BP (!) 141/76   Pulse 87   Temp 98 F (36.7 C) (Temporal)   Resp 20   Ht 5\' 5"  (1.651 m)   Wt 187 lb (84.8 kg)   SpO2 91%   BMI 31.12 kg/m        Assessment & Plan:  [name] comes in today with chief complaint of Medical Management of Chronic Issues   Diagnosis and orders addressed:  1. Benign hypertension Low sodium diet - lisinopril-hydrochlorothiazide (ZESTORETIC) 10-12.5 MG tablet; Take 1 tablet by mouth daily.  Dispense: 90 tablet; Refill: 1  2. Hyperlipidemia with target LDL less than 100 Low fat diet  3. Hypokalemia Continue potassium supplements  4. Aortic atherosclerosis ([place])  5. COPD (chronic obstructive pulmonary disease) with chronic bronchitis (HCC) - fluticasone furoate-vilanterol (BREO ELLIPTA) 200-25 MCG/INH AEPB; Inhale 1 puff into the lungs daily.  Dispense: 1 each; Refill: 5  6. Smoker Smoking cessation encouraged  7. Gastroesophageal reflux disease without esophagitis Avoid spicy foods Do not eat 2 hours prior to bedtime - pantoprazole (PROTONIX) 40 MG tablet; Take 1 tablet (40 mg total) by mouth 2 (two) times daily.  Dispense: 180 tablet;  Refill: 1  8. Anxiety Stress management Please try to wean down to only BID - ALPRAZolam (XANAX) 0.5 MG tablet; Take 1 tablet (0.5 mg total) by mouth 3 (three) times daily as needed for anxiety.  Dispense: 90 tablet; Refill: 2  9. Recurrent major depressive disorder, in full remission ([place]) - PARoxetine (PAXIL) 20 MG tablet; Take 1 tablet (20 mg total) by mouth daily.  Dispense: 90 tablet; Refill: 1  10. Chronic midline low back pain without sciatica - HYDROcodone-acetaminophen (NORCO) 10-325 MG tablet; Take 1 tablet by mouth every 8 (eight) hours as needed.  Dispense: 90 tablet; Refill: 0 - HYDROcodone-acetaminophen (NORCO) 10-325 MG tablet; Take 1 tablet by mouth every 8 (eight) hours as needed.  Dispense: 90 tablet; Refill: 0 - HYDROcodone-acetaminophen (NORCO) 10-325 MG tablet; Take 1 tablet by mouth every 8 (eight) hours as needed.  Dispense: 90 tablet; Refill: 0  11. BMI 32.0-32.9,adult Discussed diet and exercise for person with BMI >25 Will recheck weight in 3-6 months   Labs pending Health Maintenance reviewed Diet and exercise encouraged  Follow up plan: 3 months   [name], FNP

## 2019-10-10 NOTE — Patient Instructions (Signed)

## 2019-10-14 IMAGING — CT CT CHEST LUNG CANCER SCREENING LOW DOSE W/O CM
2 of 4 series · 15 of 40 positions shown, 18 images · non-contrast
Comparison: None.

CLINICAL DATA: Current smoker, 50 pack-year history, lung cancer
screening.

EXAM:
CT CHEST WITHOUT CONTRAST LOW-DOSE FOR LUNG CANCER SCREENING
TECHNIQUE: Multidetector CT imaging of the chest was performed following the
standard protocol without IV contrast.

[Series 2: axial st · axial · 0.66mm/px · z∈[+1114,+1379]mm · 12 of 63 slices shown, 15 images]
[im 5/63  mediastinal]
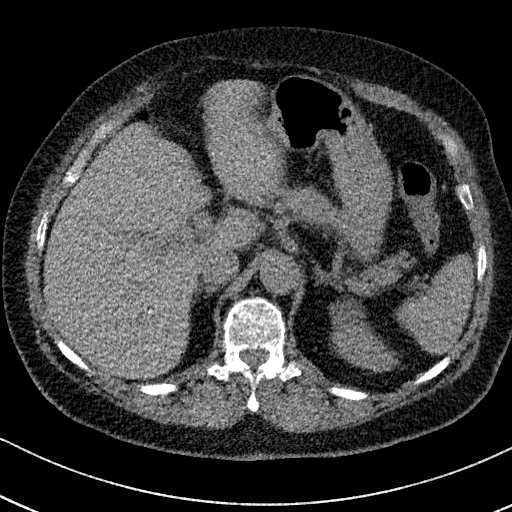
[im 5/63  lung]
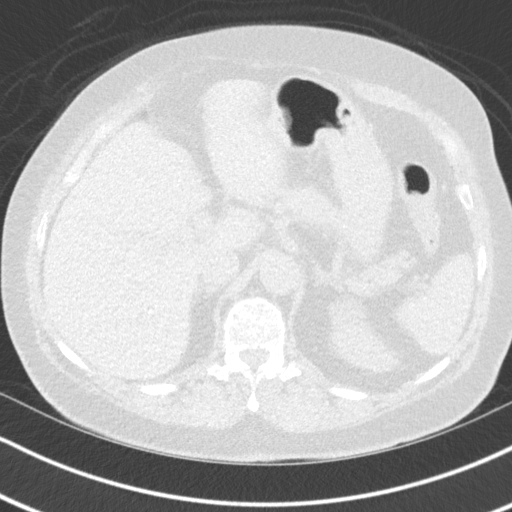
[im 10/63  lung]
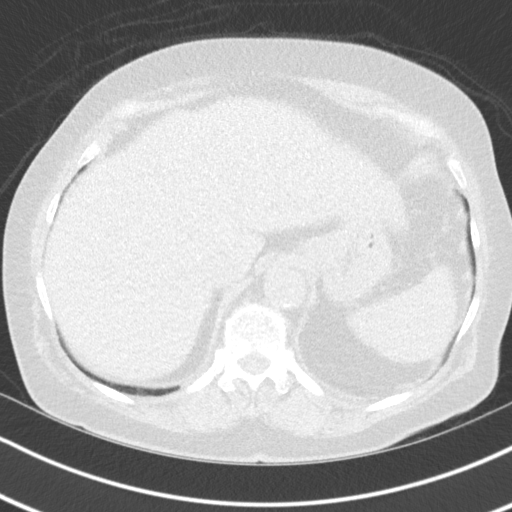
[im 15/63  lung]
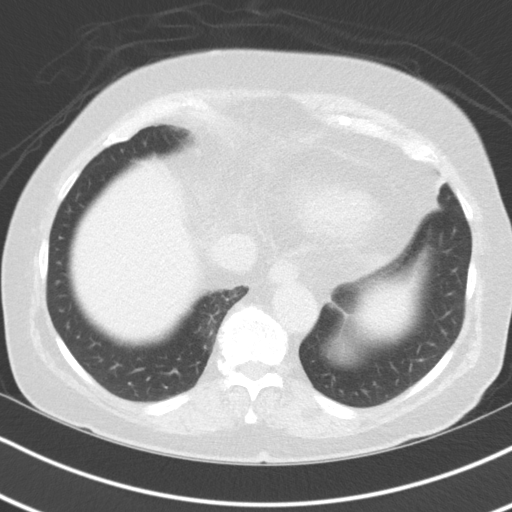
[im 20/63  lung]
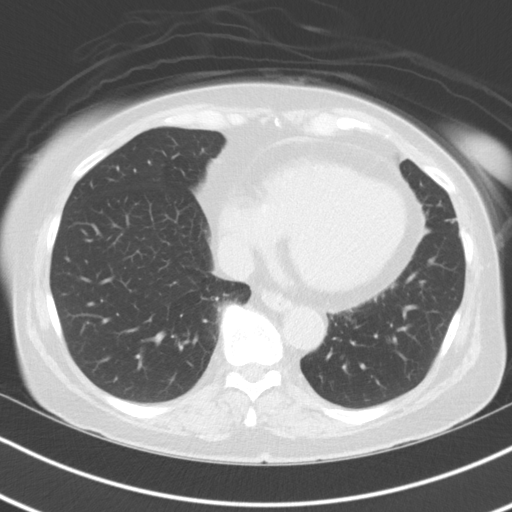
[im 24/63  mediastinal]
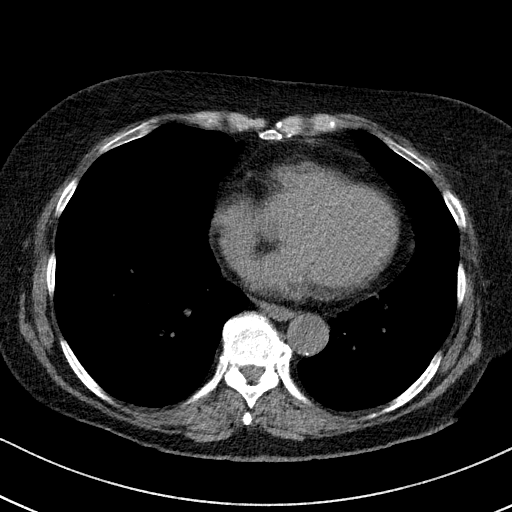
[im 24/63  lung]
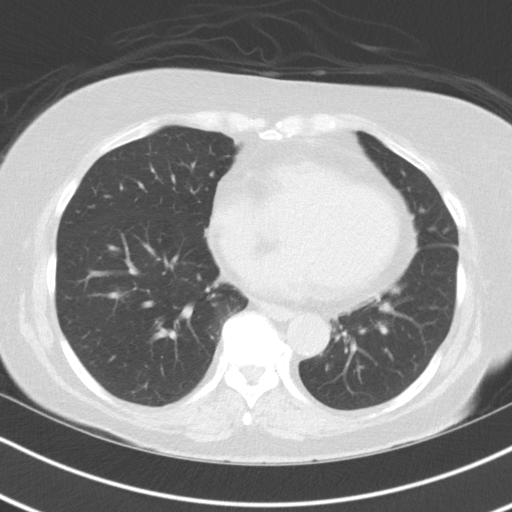
[im 29/63  lung]
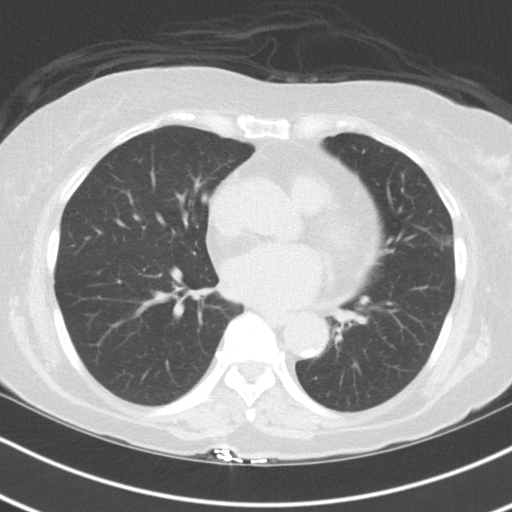
[im 34/63  lung]
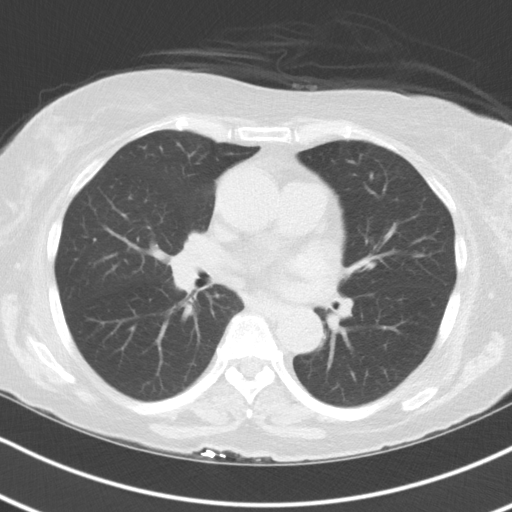
[im 39/63  lung]
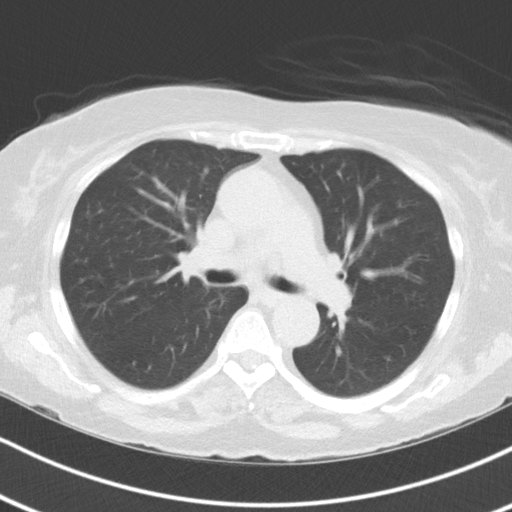
[im 43/63  mediastinal]
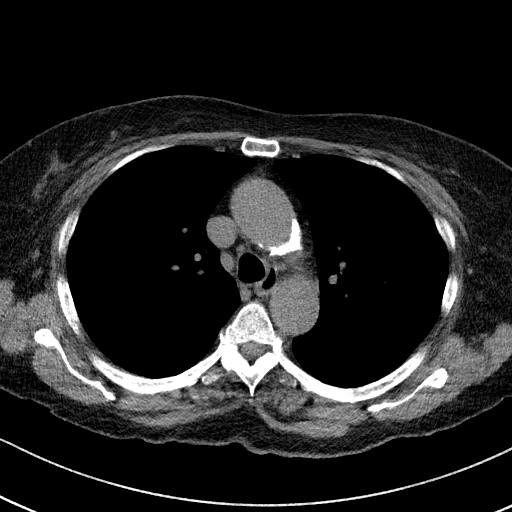
[im 43/63  lung]
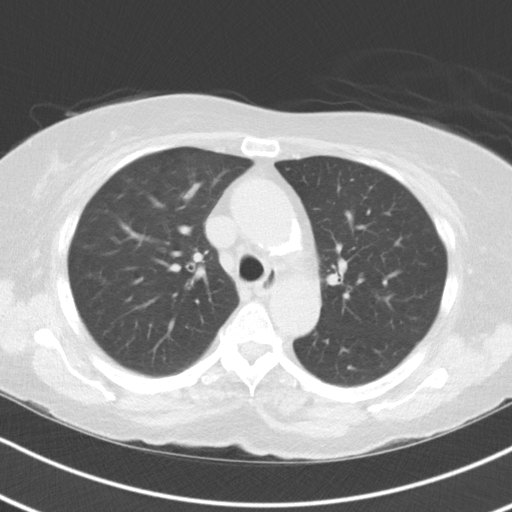
[im 48/63  lung]
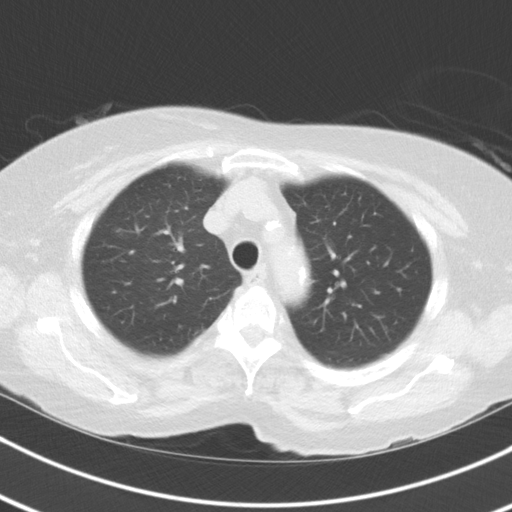
[im 53/63  lung]
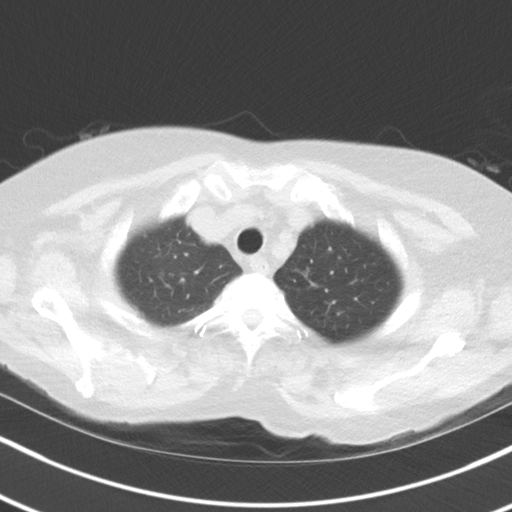
[im 58/63  lung]
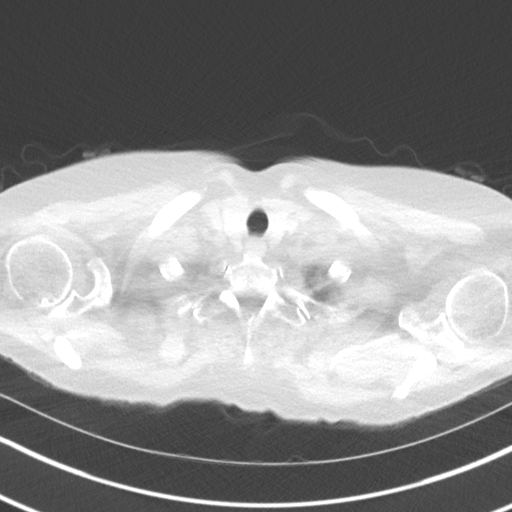

[Series 5: coronal · coronal · 0.61mm/px · 3 of 296 slices shown]
[im 60/296  lung]
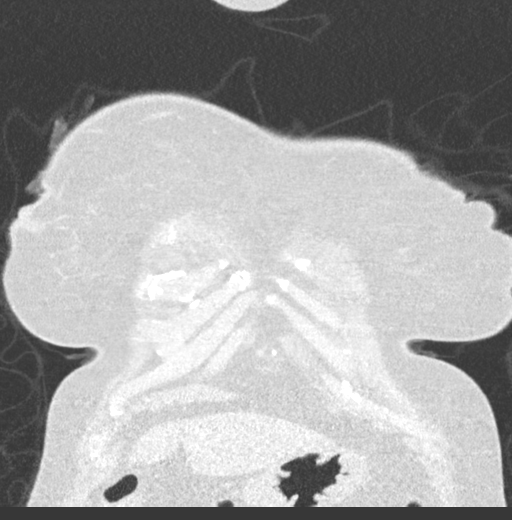
[im 119/296  lung]
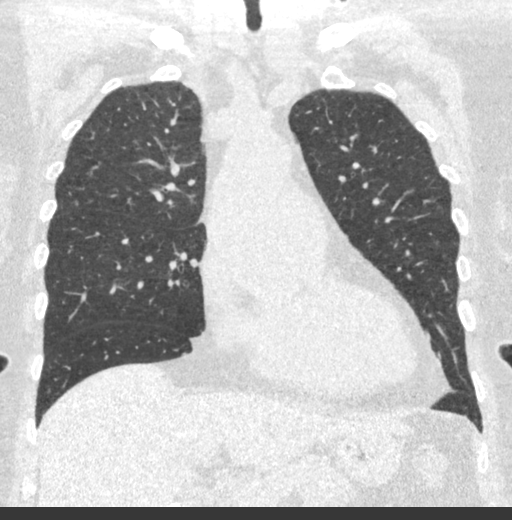
[im 178/296  lung]
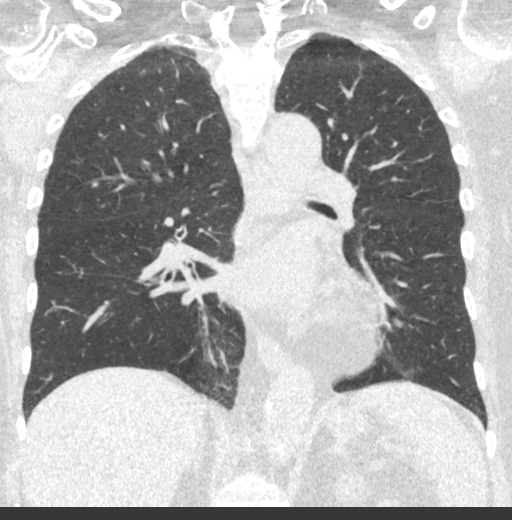

[15 of 40 positions shown; findings below may reference images not displayed]

FINDINGS: Cardiovascular: Atherosclerotic calcification of the arterial
vasculature. Heart is at the upper limits of normal in size to
mildly enlarged. No pericardial effusion.

Mediastinum/Nodes: No pathologically enlarged mediastinal or
axillary lymph nodes. Hilar regions are difficult to definitively
evaluate without IV contrast but appear grossly unremarkable.
Esophagus is unremarkable.

Lungs/Pleura: 4.7 mm right lower lobe nodule. Lungs are otherwise
clear. No pleural fluid.

Upper Abdomen: Visualized portions of the liver and adrenal glands
are unremarkable. 2.9 cm fluid density lesion off the upper pole
left kidney, likely a cyst but difficult to definitively
characterize without post-contrast imaging. Visualized portions of
the spleen, pancreas, stomach and bowel are grossly unremarkable.
Cholecystectomy.

Musculoskeletal: Degenerative changes in the spine. No worrisome
lytic or sclerotic lesions.
IMPRESSION: 1. Lung-RADS 2, benign appearance or behavior. Continue annual
screening with low-dose chest CT without contrast in 12 months.
2.  Aortic atherosclerosis ([id number]-170.0).

## 2019-11-24 DIAGNOSIS — J441 Chronic obstructive pulmonary disease with (acute) exacerbation: Secondary | ICD-10-CM | POA: Diagnosis not present

## 2019-12-22 DIAGNOSIS — J441 Chronic obstructive pulmonary disease with (acute) exacerbation: Secondary | ICD-10-CM | POA: Diagnosis not present

## 2019-12-26 ENCOUNTER — Telehealth: Payer: Self-pay | Admitting: Nurse Practitioner

## 2019-12-26 NOTE — Chronic Care Management (AMB) (Signed)
  Chronic Care Management   Note  12/26/2019 Name: [name] MRN: [id number] DOB: Feb 24, 1946  [name] is a 74 y.o. year old female who is a primary care patient of Chevis Pretty, FNP. I reached out to [name] by phone today in response to a referral sent by [name]'s health plan.     [name] was given information about Chronic Care Management services today including:  1. CCM service includes personalized support from designated clinical staff supervised by her physician, including individualized plan of care and coordination with other care providers 2. 24/7 contact phone numbers for assistance for urgent and routine care needs. 3. Service will only be billed when office clinical staff spend 20 minutes or more in a month to coordinate care. 4. Only one practitioner may furnish and bill the service in a calendar month. 5. The patient may stop CCM services at any time (effective at the end of the month) by phone call to the office staff. 6. The patient will be responsible for cost sharing (co-pay) of up to 20% of the service fee (after annual deductible is met).  Patient agreed to services and verbal consent obtained.   Follow up plan: Telephone appointment with care management team member scheduled for:07/16/2020  Noreene Larsson, [place], [place], [place] [address] Direct Dial: [phone] [email or web address]: [email or web address]

## 2019-12-31 ENCOUNTER — Other Ambulatory Visit: Payer: Self-pay | Admitting: Nurse Practitioner

## 2019-12-31 DIAGNOSIS — K529 Noninfective gastroenteritis and colitis, unspecified: Secondary | ICD-10-CM

## 2020-01-09 ENCOUNTER — Encounter: Payer: Self-pay | Admitting: Nurse Practitioner

## 2020-01-09 ENCOUNTER — Ambulatory Visit (INDEPENDENT_AMBULATORY_CARE_PROVIDER_SITE_OTHER): Payer: Medicare Other | Admitting: Nurse Practitioner

## 2020-01-09 DIAGNOSIS — M545 Low back pain, unspecified: Secondary | ICD-10-CM

## 2020-01-09 DIAGNOSIS — F3342 Major depressive disorder, recurrent, in full remission: Secondary | ICD-10-CM

## 2020-01-09 DIAGNOSIS — E785 Hyperlipidemia, unspecified: Secondary | ICD-10-CM

## 2020-01-09 DIAGNOSIS — Z6832 Body mass index (BMI) 32.0-32.9, adult: Secondary | ICD-10-CM

## 2020-01-09 DIAGNOSIS — I700 Atherosclerosis of aorta: Secondary | ICD-10-CM

## 2020-01-09 DIAGNOSIS — I10 Essential (primary) hypertension: Secondary | ICD-10-CM

## 2020-01-09 DIAGNOSIS — K219 Gastro-esophageal reflux disease without esophagitis: Secondary | ICD-10-CM

## 2020-01-09 DIAGNOSIS — F17200 Nicotine dependence, unspecified, uncomplicated: Secondary | ICD-10-CM

## 2020-01-09 DIAGNOSIS — J449 Chronic obstructive pulmonary disease, unspecified: Secondary | ICD-10-CM

## 2020-01-09 DIAGNOSIS — G8929 Other chronic pain: Secondary | ICD-10-CM

## 2020-01-09 DIAGNOSIS — K529 Noninfective gastroenteritis and colitis, unspecified: Secondary | ICD-10-CM

## 2020-01-09 DIAGNOSIS — F419 Anxiety disorder, unspecified: Secondary | ICD-10-CM

## 2020-01-09 DIAGNOSIS — E876 Hypokalemia: Secondary | ICD-10-CM

## 2020-01-09 MED ORDER — HYDROCODONE-ACETAMINOPHEN 10-325 MG PO TABS: 1.0000 | ORAL_TABLET | Freq: Three times a day (TID) | ORAL | 0 refills | Status: DC | PRN

## 2020-01-09 MED ORDER — PANTOPRAZOLE SODIUM 40 MG PO TBEC: 40.0000 mg | DELAYED_RELEASE_TABLET | Freq: Two times a day (BID) | ORAL | 1 refills | Status: DC

## 2020-01-09 MED ORDER — BREO ELLIPTA 200-25 MCG/INH IN AEPB: 1.0000 | INHALATION_SPRAY | Freq: Every day | RESPIRATORY_TRACT | 5 refills | Status: DC

## 2020-01-09 MED ORDER — ALPRAZOLAM 0.5 MG PO TABS: 0.5000 mg | ORAL_TABLET | Freq: Three times a day (TID) | ORAL | 2 refills | Status: DC | PRN

## 2020-01-09 MED ORDER — PAROXETINE HCL 20 MG PO TABS: 20.0000 mg | ORAL_TABLET | Freq: Every day | ORAL | 1 refills | Status: DC

## 2020-01-09 MED ORDER — LISINOPRIL-HYDROCHLOROTHIAZIDE 10-12.5 MG PO TABS: 1.0000 | ORAL_TABLET | Freq: Every day | ORAL | 1 refills | Status: DC

## 2020-01-09 MED ORDER — DICYCLOMINE HCL 10 MG PO CAPS: 10.0000 mg | ORAL_CAPSULE | Freq: Four times a day (QID) | ORAL | 0 refills | Status: DC | PRN

## 2020-01-09 NOTE — Progress Notes (Signed)
Virtual Visit via telephone Note Due to COVID-19 pandemic this visit was conducted virtually. This visit type was conducted due to national recommendations for restrictions regarding the COVID-19 Pandemic (e.g. social distancing, sheltering in place) in an effort to limit this patient's exposure and mitigate transmission in our community. All issues noted in this document were discussed and addressed.  A physical exam was not performed with this format.  I connected with [name] on 01/09/20 at 1:55 by telephone and verified that I am speaking with the correct person using two identifiers. [name] is currently located at home and no one is currently with  her during visit. The provider, [name], FNP is located in their office at time of visit.  I discussed the limitations, risks, security and privacy concerns of performing an evaluation and management service by telephone and the availability of in person appointments. I also discussed with the patient that there may be a patient responsible charge related to this service. The patient expressed understanding and agreed to proceed.   History and Present Illness:   Chief Complaint: Medical Management of Chronic Issues    HPI:  1. Benign hypertension No c/o chest pain, sob or headache. Does nt check blood pressure at home. BP Readings from Last 3 Encounters:  10/10/19 (!) 141/76  07/11/19 137/78  10/16/18 124/70     2. Aortic atherosclerosis ([place]) Seen on recent chest xray. Patient does not feel the need to see cardiology.  3. COPD (chronic obstructive pulmonary disease) with chronic bronchitis ([place]) She has a chronic cough. She is on BREO and duoneb. She says she has been dong well and has not needed to use albuterol.  4. Gastroesophageal reflux disease without esophagitis Patient is on protonix daily and seems to be working well to keep symptoms under control.  5. Hyperlipidemia with target LDL  less than 100 She does not watch diet and does very little exercise. Lab Results  Component Value Date   CHOL 149 07/11/2019   HDL 77 07/11/2019   LDLCALC 56 07/11/2019   TRIG 84 07/11/2019   CHOLHDL 1.9 07/11/2019     6. Hypokalemia Denies any lower ext cramping Lab Results  Component Value Date   K 4.4 07/11/2019     7. Recurrent major depressive disorder, in full remission [facility]) She is paxil and says she thinks her depression is doing well.   8. Anxiety Is on xanax 0.5 TID. she is a very anxious person and worries abot evrything. Xanax helps to calm her down. She stays anxious and gets shaky if she does not have her meds. She was on 1mg  and we have been able to cut her back to 0.5mg  TID. GAD 7 : Generalized Anxiety Score 01/09/2020 10/10/2019 07/11/2019 04/01/2016  Nervous, Anxious, on Edge 3 2 1 1   Control/stop worrying 2 2 1  0  Worry too much - different things 2 2 1  0  Trouble relaxing 1 1 0 0  Restless 1 1 0 0  Easily annoyed or irritable 0 0 0 1  Afraid - awful might happen - 0 0 0  Total GAD 7 Score - 8 3 2   Anxiety Difficulty Somewhat difficult Not difficult at all Somewhat difficult Not difficult at all      9. Smoker Refuses to quit smoking, despite the fact that this contributed to the death of her husband  54. Chronic midline low back pain without sciatica Pain assessment: Cause of pain- arthritis and DDD Pain  location- norco 10/325  Pain on scale of 1-10- 4//10 currently Frequency- daily What increases pain-tomuch activity What makes pain Better-rest Effects on ADL - some days she is not able to do anything Any change in general medical condition-none  Current opioids rx- norco 10/325 # meds rx- 90 Effectiveness of current meds-helps bring pain down to a 2/10 Adverse reactions form pain meds-none Morphine equivalent- 30 MEDD  Pill count performed-No Last drug screen - 04/16/19 ( high risk q100m, moderate risk q57m, low risk yearly ) Urine drug  screen today- No Was the [place] reviewed- yes  If yes were their any concerning findings? - no   Overdose risk: 1   Pain contract signed on: 07/15/19   11. BMI 32.0-32.9,adult No recent weight changes Wt Readings from Last 3 Encounters:  10/10/19 187 lb (84.8 kg)  07/11/19 184 lb 12.8 oz (83.8 kg)  10/16/18 176 lb (79.8 kg)   BMI Readings from Last 3 Encounters:  10/10/19 31.12 kg/m  07/11/19 30.75 kg/m  10/16/18 29.29 kg/m       Outpatient Encounter Medications as of 01/09/2020  Medication Sig  . albuterol (PROAIR HFA) 108 (90 Base) MCG/ACT inhaler Inhale 2 puffs by mouth every 6 hours as needed for wheezing or shortness of breath  . ALPRAZolam (XANAX) 0.5 MG tablet Take 1 tablet (0.5 mg total) by mouth 3 (three) times daily as needed for anxiety.  . Aspirin-Acetaminophen-Caffeine (GOODYS EXTRA STRENGTH) 500-325-65 MG PACK Take 1 packet by mouth every 8 (eight) hours as needed (for pain.).  Marland Kitchen calcium carbonate (OS-CAL) 600 MG TABS Take 600 mg by mouth daily.   . Cholecalciferol (VITAMIN D3) 1000 UNITS CAPS Take 3,000 mg by mouth daily.   Marland Kitchen dicyclomine (BENTYL) 10 MG capsule TAKE 1 CAPSULE (10 MG TOTAL) BY MOUTH 4 (FOUR) TIMES DAILY AS NEEDED FOR SPASMS.  Marland Kitchen diphenhydrAMINE (BENADRYL) 25 MG tablet Take 25 mg by mouth 2 (two) times daily as needed. For Allergies  . fluticasone (FLONASE) 50 MCG/ACT nasal spray SPRAY 2 SPRAYS INTO EACH NOSTRIL EVERY DAY  . fluticasone furoate-vilanterol (BREO ELLIPTA) 200-25 MCG/INH AEPB Inhale 1 puff into the lungs daily.  Marland Kitchen HYDROcodone-acetaminophen (NORCO) 10-325 MG tablet Take 1 tablet by mouth every 8 (eight) hours as needed.  Marland Kitchen HYDROcodone-acetaminophen (NORCO) 10-325 MG tablet Take 1 tablet by mouth every 8 (eight) hours as needed.  Marland Kitchen HYDROcodone-acetaminophen (NORCO) 10-325 MG tablet Take 1 tablet by mouth every 8 (eight) hours as needed.  Marland Kitchen ipratropium (ATROVENT) 0.02 % nebulizer solution Take 2.5 mLs (0.5 mg total) by nebulization every  6 (six) hours as needed for wheezing or shortness of breath.  . levalbuterol (XOPENEX) 1.25 MG/0.5ML nebulizer solution Take 1.25 mg by nebulization every 6 (six) hours as needed for wheezing or shortness of breath.  . lisinopril-hydrochlorothiazide (ZESTORETIC) 10-12.5 MG tablet Take 1 tablet by mouth daily.  . Multiple Vitamin (MULITIVITAMIN WITH MINERALS) TABS Take 1 tablet by mouth daily.  . Nebulizers (COMPRESSOR/NEBULIZER) MISC Please use with nebulizer solution every 6 hours as needed for shortness of breath or wheezing.  . pantoprazole (PROTONIX) 40 MG tablet Take 1 tablet (40 mg total) by mouth 2 (two) times daily.  Marland Kitchen PARoxetine (PAXIL) 20 MG tablet Take 1 tablet (20 mg total) by mouth daily.  . vitamin C (ASCORBIC ACID) 500 MG tablet Take 1,000 mg by mouth daily.      Past Surgical History:  Procedure Laterality Date  . APPENDECTOMY     74 years old  . BACK SURGERY  X 2  . BIOPSY  08/11/2016   Procedure: BIOPSY;  Surgeon: Daneil Dolin, MD;  Location: [facility];  Service: Endoscopy;;  gastric bx's  . BREAST SURGERY     biopsy only  . CARPAL TUNNEL RELEASE     right hand  . CERVICAL BIOPSY  W/ LOOP ELECTRODE EXCISION  02/2011   CIN-2 with focal ectocervical margin involvement. With CIN-1 ECC negative  . CHOLECYSTECTOMY  1984  . ESOPHAGOGASTRODUODENOSCOPY  12/01/2011   Hiatal hernia/Mild fibrotic pyloric stenosis status post dilation with passage of  the scope.  The previously noted gastric ulcer completely healed  The remainder of the gastric mucosa appeared normal  . ESOPHAGOGASTRODUODENOSCOPY (EGD) WITH PROPOFOL N/A 08/11/2016   Procedure: ESOPHAGOGASTRODUODENOSCOPY (EGD) WITH PROPOFOL;  Surgeon: Daneil Dolin, MD;  Location: [facility];  Service: Endoscopy;  Laterality: N/A;  2:15 PM  . FRACTURE SURGERY  10/18/2016   left knee in 3 places  . MALONEY DILATION  07/28/2011   Schatzki's ring s/p 57F with small UES tear as well, small hh, 1cm pyloric channel ulcer, bx  benign without H.Pylori. Mobic/goody's at time.  Marland Kitchen MALONEY DILATION N/A 08/11/2016   Procedure: Venia Minks DILATION;  Surgeon: Daneil Dolin, MD;  Location: [facility];  Service: Endoscopy;  Laterality: N/A;  . ORIF PATELLA Left 10/19/2016   Procedure: OPEN REDUCTION INTERNAL (ORIF) FIXATION PATELLA;  Surgeon: Carole Civil, MD;  Location: [facility];  Service: Orthopedics;  Laterality: Left;  . SPINE SURGERY  2009   cervical disc  . SPINE SURGERY  2009   lumbar  . TUBAL LIGATION  1976  . tumor removed from arm      Family History  Problem Relation Age of Onset  . Heart disease Father        CHF  . Heart disease Mother   . COPD Sister   . Diabetes Sister   . Neuropathy Sister   . COPD Sister   . COPD Sister   . Cancer Sister   . Stroke Brother   . Heart disease Brother   . Colon cancer Neg Hx   . Anesthesia problems Neg Hx   . Hypotension Neg Hx   . Malignant hyperthermia Neg Hx   . Pseudochol deficiency Neg Hx   . Colon polyps Neg Hx     New complaints: None today  Social history: Lives by herself and family does not check on her often.    Review of Systems  Constitutional: Negative for diaphoresis and weight loss.  Eyes: Negative for blurred vision, double vision and pain.  Respiratory: Negative for shortness of breath.   Cardiovascular: Negative for chest pain, palpitations, orthopnea and leg swelling.  Gastrointestinal: Negative for abdominal pain.  Skin: Negative for rash.  Neurological: Negative for dizziness, sensory change, loss of consciousness, weakness and headaches.  Endo/Heme/Allergies: Negative for polydipsia. Does not bruise/bleed easily.  Psychiatric/Behavioral: Negative for memory loss. The patient does not have insomnia.   All other systems reviewed and are negative.    Observations/Objective: Alert and oriented- answers all questions appropriately No distress Very anxious about cutting back on xanax.   Assessment and Plan: [name] comes in today with chief complaint of Medical Management of Chronic Issues   Diagnosis and orders addressed:  1. Benign hypertension Low sodium diet - lisinopril-hydrochlorothiazide (ZESTORETIC) 10-12.5 MG tablet; Take 1 tablet by mouth daily.  Dispense: 90 tablet; Refill: 1  2. Aortic atherosclerosis ([place]) Will recheck at next appointmnet  3. COPD (chronic obstructive pulmonary disease) with chronic bronchitis ([facility]) - fluticasone furoate-vilanterol (BREO ELLIPTA) 200-25 MCG/INH AEPB; Inhale 1 puff into the lungs daily.  Dispense: 1 each; Refill: 5  4. Gastroesophageal reflux disease without esophagitis Avoid spicy foods Do not eat 2 hours prior to bedtime  - pantoprazole (PROTONIX) 40 MG tablet; Take 1 tablet (40 mg total) by mouth 2 (two) times daily.  Dispense: 180 tablet; Refill: 1  5. Hyperlipidemia with target LDL less than 100 Low fat diet Will check labs before starting statin  6. Hypokalemia  7. Recurrent major depressive disorder, in full remission ([facility]) Stress managent - PARoxetine (PAXIL) 20 MG tablet; Take 1 tablet (20 mg total) by mouth daily.  Dispense: 90 tablet; Refill: 1  8. Anxiety Will cut back to xanax BID at next visit patient aware - ALPRAZolam (XANAX) 0.5 MG tablet; Take 1 tablet (0.5 mg total) by mouth 3 (three) times daily as needed for anxiety.  Dispense: 90 tablet; Refill: 2  9. Smoker Refuses to stop smoking  10. Chronic midline low back pain without sciatica Pain says she cannot do with out pain medication - HYDROcodone-acetaminophen (NORCO) 10-325 MG tablet; Take 1 tablet by mouth every 8 (eight) hours as needed.  Dispense: 90 tablet; Refill: 0 - HYDROcodone-acetaminophen (NORCO) 10-325 MG tablet; Take 1 tablet by mouth every 8 (eight) hours as needed.  Dispense: 90 tablet; Refill: 0 - HYDROcodone-acetaminophen (NORCO) 10-325 MG tablet; Take 1 tablet by mouth every 8 (eight) hours as needed.  Dispense: 90 tablet; Refill: 0  11. BMI  32.0-32.9,adult Discussed diet and exercise for person with BMI >25 Will recheck weight in 3-6 months  12. Chronic diarrhea - dicyclomine (BENTYL) 10 MG capsule; Take 1 capsule (10 mg total) by mouth 4 (four) times daily as needed for spasms.  Dispense: 360 capsule; Refill: 0   Labs pending Health Maintenance reviewed Diet and exercise encouraged  Follow Up Instructions: 3 months    I discussed the assessment and treatment plan with the patient. The patient was provided an opportunity to ask questions and all were answered. The patient agreed with the plan and demonstrated an understanding of the instructions.   The patient was advised to call back or seek an in-person evaluation if the symptoms worsen or if the condition fails to improve as anticipated.  The above assessment and management plan was discussed with the patient. The patient verbalized understanding of and has agreed to the management plan. Patient is aware to call the clinic if symptoms persist or worsen. Patient is aware when to return to the clinic for a follow-up visit. Patient educated on when it is appropriate to go to the emergency department.   Time call ended:  2:15  I provided 20 minutes of non-face-to-face time during this encounter.    [name], FNP

## 2020-01-21 ENCOUNTER — Other Ambulatory Visit: Payer: Self-pay

## 2020-01-21 ENCOUNTER — Other Ambulatory Visit: Payer: Medicare Other

## 2020-01-21 DIAGNOSIS — E785 Hyperlipidemia, unspecified: Secondary | ICD-10-CM | POA: Diagnosis not present

## 2020-01-21 DIAGNOSIS — I10 Essential (primary) hypertension: Secondary | ICD-10-CM

## 2020-01-22 DIAGNOSIS — J441 Chronic obstructive pulmonary disease with (acute) exacerbation: Secondary | ICD-10-CM | POA: Diagnosis not present

## 2020-01-22 LAB — CBC WITH DIFFERENTIAL/PLATELET
Basophils Absolute: 0.1 10*3/uL (ref 0.0–0.2)
Basos: 1 %
EOS (ABSOLUTE): 0.2 10*3/uL (ref 0.0–0.4)
Eos: 3 %
Hematocrit: 46.6 % (ref 34.0–46.6)
Hemoglobin: 15.1 g/dL (ref 11.1–15.9)
Immature Grans (Abs): 0 10*3/uL (ref 0.0–0.1)
Immature Granulocytes: 0 %
Lymphocytes Absolute: 3.2 10*3/uL — ABNORMAL HIGH (ref 0.7–3.1)
Lymphs: 33 %
MCH: 29.2 pg (ref 26.6–33.0)
MCHC: 32.4 g/dL (ref 31.5–35.7)
MCV: 90 fL (ref 79–97)
Monocytes Absolute: 0.6 10*3/uL (ref 0.1–0.9)
Monocytes: 6 %
Neutrophils Absolute: 5.5 10*3/uL (ref 1.4–7.0)
Neutrophils: 57 %
Platelets: 254 10*3/uL (ref 150–450)
RBC: 5.17 x10E6/uL (ref 3.77–5.28)
RDW: 12.6 % (ref 11.7–15.4)
WBC: 9.7 10*3/uL (ref 3.4–10.8)

## 2020-01-22 LAB — LIPID PANEL
Chol/HDL Ratio: 2.8 ratio (ref 0.0–4.4)
Cholesterol, Total: 199 mg/dL (ref 100–199)
HDL: 72 mg/dL (ref >39)
LDL Chol Calc (NIH): 106 mg/dL — ABNORMAL HIGH (ref 0–99)
Triglycerides: 122 mg/dL (ref 0–149)
VLDL Cholesterol Cal: 21 mg/dL (ref 5–40)

## 2020-01-22 LAB — CMP14+EGFR
ALT: 8 IU/L (ref 0–32)
AST: 14 IU/L (ref 0–40)
Albumin/Globulin Ratio: 2.4 — ABNORMAL HIGH (ref 1.2–2.2)
Albumin: 4.3 g/dL (ref 3.7–4.7)
Alkaline Phosphatase: 77 IU/L (ref 39–117)
BUN/Creatinine Ratio: 11 — ABNORMAL LOW (ref 12–28)
BUN: 6 mg/dL — ABNORMAL LOW (ref 8–27)
Bilirubin Total: 0.4 mg/dL (ref 0.0–1.2)
CO2: 27 mmol/L (ref 20–29)
Calcium: 9.4 mg/dL (ref 8.7–10.3)
Chloride: 99 mmol/L (ref 96–106)
Creatinine, Ser: 0.56 mg/dL — ABNORMAL LOW (ref 0.57–1.00)
GFR calc Af Amer: 108 mL/min/{1.73_m2} (ref >59)
GFR calc non Af Amer: 93 mL/min/{1.73_m2} (ref >59)
Globulin, Total: 1.8 g/dL (ref 1.5–4.5)
Glucose: 107 mg/dL — ABNORMAL HIGH (ref 65–99)
Potassium: 4.1 mmol/L (ref 3.5–5.2)
Sodium: 140 mmol/L (ref 134–144)
Total Protein: 6.1 g/dL (ref 6.0–8.5)

## 2020-01-28 ENCOUNTER — Other Ambulatory Visit: Payer: Self-pay | Admitting: Nurse Practitioner

## 2020-01-28 DIAGNOSIS — F419 Anxiety disorder, unspecified: Secondary | ICD-10-CM

## 2020-02-21 DIAGNOSIS — J441 Chronic obstructive pulmonary disease with (acute) exacerbation: Secondary | ICD-10-CM | POA: Diagnosis not present

## 2020-03-23 DIAGNOSIS — J441 Chronic obstructive pulmonary disease with (acute) exacerbation: Secondary | ICD-10-CM | POA: Diagnosis not present

## 2020-04-07 ENCOUNTER — Ambulatory Visit (INDEPENDENT_AMBULATORY_CARE_PROVIDER_SITE_OTHER): Payer: Medicare Other | Admitting: Nurse Practitioner

## 2020-04-07 ENCOUNTER — Encounter: Payer: Self-pay | Admitting: Nurse Practitioner

## 2020-04-07 ENCOUNTER — Other Ambulatory Visit: Payer: Self-pay

## 2020-04-07 ENCOUNTER — Ambulatory Visit (INDEPENDENT_AMBULATORY_CARE_PROVIDER_SITE_OTHER): Payer: Medicare Other

## 2020-04-07 VITALS — BP 121/77 | HR 99 | Temp 98.1°F | Resp 20 | Ht 65.0 in | Wt 184.0 lb

## 2020-04-07 DIAGNOSIS — E876 Hypokalemia: Secondary | ICD-10-CM | POA: Diagnosis not present

## 2020-04-07 DIAGNOSIS — I700 Atherosclerosis of aorta: Secondary | ICD-10-CM

## 2020-04-07 DIAGNOSIS — J449 Chronic obstructive pulmonary disease, unspecified: Secondary | ICD-10-CM

## 2020-04-07 DIAGNOSIS — F3342 Major depressive disorder, recurrent, in full remission: Secondary | ICD-10-CM

## 2020-04-07 DIAGNOSIS — Z6832 Body mass index (BMI) 32.0-32.9, adult: Secondary | ICD-10-CM

## 2020-04-07 DIAGNOSIS — E785 Hyperlipidemia, unspecified: Secondary | ICD-10-CM | POA: Diagnosis not present

## 2020-04-07 DIAGNOSIS — M545 Low back pain: Secondary | ICD-10-CM

## 2020-04-07 DIAGNOSIS — G8929 Other chronic pain: Secondary | ICD-10-CM

## 2020-04-07 DIAGNOSIS — I10 Essential (primary) hypertension: Secondary | ICD-10-CM | POA: Diagnosis not present

## 2020-04-07 DIAGNOSIS — F419 Anxiety disorder, unspecified: Secondary | ICD-10-CM

## 2020-04-07 DIAGNOSIS — Z87891 Personal history of nicotine dependence: Secondary | ICD-10-CM | POA: Diagnosis not present

## 2020-04-07 DIAGNOSIS — K219 Gastro-esophageal reflux disease without esophagitis: Secondary | ICD-10-CM

## 2020-04-07 DIAGNOSIS — K529 Noninfective gastroenteritis and colitis, unspecified: Secondary | ICD-10-CM

## 2020-04-07 MED ORDER — HYDROCODONE-ACETAMINOPHEN 10-325 MG PO TABS: 1.0000 | ORAL_TABLET | Freq: Three times a day (TID) | ORAL | 0 refills | Status: DC | PRN

## 2020-04-07 MED ORDER — LISINOPRIL-HYDROCHLOROTHIAZIDE 10-12.5 MG PO TABS: 1.0000 | ORAL_TABLET | Freq: Every day | ORAL | 1 refills | Status: DC

## 2020-04-07 MED ORDER — PANTOPRAZOLE SODIUM 40 MG PO TBEC: 40.0000 mg | DELAYED_RELEASE_TABLET | Freq: Two times a day (BID) | ORAL | 1 refills | Status: DC

## 2020-04-07 MED ORDER — DICYCLOMINE HCL 10 MG PO CAPS: 10.0000 mg | ORAL_CAPSULE | Freq: Four times a day (QID) | ORAL | 0 refills | Status: DC | PRN

## 2020-04-07 MED ORDER — PAROXETINE HCL 20 MG PO TABS: 20.0000 mg | ORAL_TABLET | Freq: Every day | ORAL | 1 refills | Status: DC

## 2020-04-07 MED ORDER — ALPRAZOLAM 0.5 MG PO TABS: 0.5000 mg | ORAL_TABLET | Freq: Three times a day (TID) | ORAL | 2 refills | Status: DC | PRN

## 2020-04-07 NOTE — Progress Notes (Signed)
Subjective:    Patient ID: [name], female    DOB: 1945/11/25, 74 y.o.   MRN: [id number]   Chief Complaint: Medical Management of Chronic Issues    HPI:  1. Benign hypertension No c/o chest pain, sob or headache. Does not check blood pressure at home. BP Readings from Last 3 Encounters:  04/07/20 121/77  10/10/19 (!) 141/76  07/11/19 137/78     2. Hyperlipidemia with target LDL less than 100 Doe snot watch diet and does very little exercise. Cant take statin due to increase in liver enzymes. Lab Results  Component Value Date   CHOL 199 01/21/2020   HDL 72 01/21/2020   LDLCALC 106 (H) 01/21/2020   TRIG 122 01/21/2020   CHOLHDL 2.8 01/21/2020     3. Hypokalemia No c/o cramping  4. Aortic atherosclerosis (HCC) Seen on chest xray- no c/o chest pain  5. COPD (chronic obstructive pulmonary disease) with chronic bronchitis (HCC) Is on BREO dialy and is doing well. She does still smoke over a pack a day.  6. Gastroesophageal reflux disease without esophagitis Is on protonix daily. Is doing well.  7. Anxiety Is on xanax. I have weaned her down to 0.5mg  TID. She gets very upset if I take them from hr. Will do referral to psych. GAD 7 : Generalized Anxiety Score 04/07/2020 01/09/2020 10/10/2019 07/11/2019  Nervous, Anxious, on Edge 3 3 2 1   Control/stop worrying 3 2 2 1   Worry too much - different things 3 2 2 1   Trouble relaxing 3 1 1  0  Restless 0 1 1 0  Easily annoyed or irritable 0 0 0 0  Afraid - awful might happen 0 - 0 0  Total GAD 7 Score 12 - 8 3  Anxiety Difficulty Not difficult at all Somewhat difficult Not difficult at all Somewhat difficult     8. Recurrent major depressive disorder, in full remission ([place]) Is on paxil and is doing well. Depression screen [facility] 2/9 04/07/2020 01/09/2020 10/10/2019  Decreased Interest 0 1 0  Down, Depressed, Hopeless 0 1 0  PHQ - 2 Score 0 2 0  Altered sleeping - 1 -  Tired, decreased energy - 2 -  Change in appetite  - 0 -  Feeling bad or failure about yourself  - 0 -  Trouble concentrating - 0 -  Moving slowly or fidgety/restless - 0 -  Suicidal thoughts - 0 -  PHQ-9 Score - 5 -  Difficult doing work/chores - Somewhat difficult -  Some recent data might be hidden     9. Chronic midline low back pain without sciatica Pain assessment: Cause of pain- DDD Pain location- lower back- has had surgery on back years ago Pain on scale of 1-10- 5/10 currrently Frequency- daily What increases pain-to muh activity What makes pain Better-rest Effects on ADL - none Any change in general medical condition-none  Current opioids rx- norco 10/325 3x a day # meds rx- #90 Effectiveness of current meds-helps Adverse reactions form pain meds-none Morphine equivalent- 30 MEDD  Pill count performed-No Last drug screen - 04/16/19 ( high risk q72m, moderate risk q70m, low risk yearly ) Urine drug screen today- No Was the [place] reviewed- yes  If yes were their any concerning findings? - no   Overdose risk: 1  Pain contract signed on: 07/15/19   10. BMI 32.0-32.9,adult No recent weight changes Wt Readings from Last 3 Encounters:  04/07/20 184 lb (83.5 kg)  10/10/19 187 lb (84.8 kg)  07/11/19 184 lb 12.8 oz (83.8 kg)   BMI Readings from Last 3 Encounters:  04/07/20 30.62 kg/m  10/10/19 31.12 kg/m  07/11/19 30.75 kg/m    11. Chronic diarrhea The bentyl helps. She needs to see Dr. Buford Dresser   Outpatient Encounter Medications as of 04/07/2020  Medication Sig  . albuterol (PROAIR HFA) 108 (90 Base) MCG/ACT inhaler Inhale 2 puffs by mouth every 6 hours as needed for wheezing or shortness of breath  . ALPRAZolam (XANAX) 0.5 MG tablet Take 1 tablet (0.5 mg total) by mouth 3 (three) times daily as needed for anxiety.  . Aspirin-Acetaminophen-Caffeine (GOODYS EXTRA STRENGTH) 500-325-65 MG PACK Take 1 packet by mouth every 8 (eight) hours as needed (for pain.).  Marland Kitchen calcium carbonate (OS-CAL) 600 MG TABS Take 600  mg by mouth daily.   . Cholecalciferol (VITAMIN D3) 1000 UNITS CAPS Take 3,000 mg by mouth daily.   Marland Kitchen dicyclomine (BENTYL) 10 MG capsule Take 1 capsule (10 mg total) by mouth 4 (four) times daily as needed for spasms.  . diphenhydrAMINE (BENADRYL) 25 MG tablet Take 25 mg by mouth 2 (two) times daily as needed. For Allergies  . fluticasone (FLONASE) 50 MCG/ACT nasal spray SPRAY 2 SPRAYS INTO EACH NOSTRIL EVERY DAY  . fluticasone furoate-vilanterol (BREO ELLIPTA) 200-25 MCG/INH AEPB Inhale 1 puff into the lungs daily.  Marland Kitchen HYDROcodone-acetaminophen (NORCO) 10-325 MG tablet Take 1 tablet by mouth every 8 (eight) hours as needed.  Marland Kitchen ipratropium (ATROVENT) 0.02 % nebulizer solution Take 2.5 mLs (0.5 mg total) by nebulization every 6 (six) hours as needed for wheezing or shortness of breath.  . levalbuterol (XOPENEX) 1.25 MG/0.5ML nebulizer solution Take 1.25 mg by nebulization every 6 (six) hours as needed for wheezing or shortness of breath.  . lisinopril-hydrochlorothiazide (ZESTORETIC) 10-12.5 MG tablet Take 1 tablet by mouth daily.  . Multiple Vitamin (MULITIVITAMIN WITH MINERALS) TABS Take 1 tablet by mouth daily.  . Nebulizers (COMPRESSOR/NEBULIZER) MISC Please use with nebulizer solution every 6 hours as needed for shortness of breath or wheezing.  . pantoprazole (PROTONIX) 40 MG tablet Take 1 tablet (40 mg total) by mouth 2 (two) times daily.  Marland Kitchen PARoxetine (PAXIL) 20 MG tablet Take 1 tablet (20 mg total) by mouth daily.  . vitamin C (ASCORBIC ACID) 500 MG tablet Take 1,000 mg by mouth daily.   Marland Kitchen HYDROcodone-acetaminophen (NORCO) 10-325 MG tablet Take 1 tablet by mouth every 8 (eight) hours as needed.  Marland Kitchen HYDROcodone-acetaminophen (NORCO) 10-325 MG tablet Take 1 tablet by mouth every 8 (eight) hours as needed.     Past Surgical History:  Procedure Laterality Date  . APPENDECTOMY     74 years old  . BACK SURGERY     X 2  . BIOPSY  08/11/2016   Procedure: BIOPSY;  Surgeon: Daneil Dolin, MD;   Location: [facility];  Service: Endoscopy;;  gastric bx's  . BREAST SURGERY     biopsy only  . CARPAL TUNNEL RELEASE     right hand  . CERVICAL BIOPSY  W/ LOOP ELECTRODE EXCISION  02/2011   CIN-2 with focal ectocervical margin involvement. With CIN-1 ECC negative  . CHOLECYSTECTOMY  1984  . ESOPHAGOGASTRODUODENOSCOPY  12/01/2011   Hiatal hernia/Mild fibrotic pyloric stenosis status post dilation with passage of  the scope.  The previously noted gastric ulcer completely healed  The remainder of the gastric mucosa appeared normal  . ESOPHAGOGASTRODUODENOSCOPY (EGD) WITH PROPOFOL N/A 08/11/2016   Procedure: ESOPHAGOGASTRODUODENOSCOPY (EGD) WITH PROPOFOL;  Surgeon: Cristopher Estimable  Rourk, MD;  Location: [facility];  Service: Endoscopy;  Laterality: N/A;  2:15 PM  . FRACTURE SURGERY  10/18/2016   left knee in 3 places  . MALONEY DILATION  07/28/2011   Schatzki's ring s/p 24F with small UES tear as well, small hh, 1cm pyloric channel ulcer, bx benign without H.Pylori. Mobic/goody's at time.  Marland Kitchen MALONEY DILATION N/A 08/11/2016   Procedure: Venia Minks DILATION;  Surgeon: Daneil Dolin, MD;  Location: [facility];  Service: Endoscopy;  Laterality: N/A;  . ORIF PATELLA Left 10/19/2016   Procedure: OPEN REDUCTION INTERNAL (ORIF) FIXATION PATELLA;  Surgeon: Carole Civil, MD;  Location: [facility];  Service: Orthopedics;  Laterality: Left;  . SPINE SURGERY  2009   cervical disc  . SPINE SURGERY  2009   lumbar  . TUBAL LIGATION  1976  . tumor removed from arm      Family History  Problem Relation Age of Onset  . Heart disease Father        CHF  . Heart disease Mother   . COPD Sister   . Diabetes Sister   . Neuropathy Sister   . COPD Sister   . COPD Sister   . Cancer Sister   . Stroke Brother   . Heart disease Brother   . Colon cancer Neg Hx   . Anesthesia problems Neg Hx   . Hypotension Neg Hx   . Malignant hyperthermia Neg Hx   . Pseudochol deficiency Neg Hx   . Colon polyps Neg Hx      New complaints: None today  Social history: Lives by herself- family checks on her occasionally     Review of Systems  Constitutional: Negative for diaphoresis.  Eyes: Negative for pain.  Respiratory: Negative for shortness of breath.   Cardiovascular: Negative for chest pain, palpitations and leg swelling.  Gastrointestinal: Negative for abdominal pain.  Endocrine: Negative for polydipsia.  Musculoskeletal: Positive for back pain.  Skin: Negative for rash.  Neurological: Negative for dizziness, weakness and headaches.  Hematological: Does not bruise/bleed easily.  Psychiatric/Behavioral: The patient is nervous/anxious.   All other systems reviewed and are negative.      Objective:   Physical Exam Vitals and nursing note reviewed.  Constitutional:      General: She is not in acute distress.    Appearance: Normal appearance. She is well-developed.  HENT:     Head: Normocephalic.     Nose: Nose normal.  Eyes:     Pupils: Pupils are equal, round, and reactive to light.  Neck:     Vascular: No carotid bruit or JVD.  Cardiovascular:     Rate and Rhythm: Normal rate and regular rhythm.     Heart sounds: Normal heart sounds.  Pulmonary:     Effort: Pulmonary effort is normal. No respiratory distress.     Breath sounds: Wheezing (exp throughout) present. No rales.  Chest:     Chest wall: No tenderness.  Abdominal:     General: Bowel sounds are normal. There is no distension or abdominal bruit.     Palpations: Abdomen is soft. There is no hepatomegaly, splenomegaly, mass or pulsatile mass.     Tenderness: There is no abdominal tenderness.  Musculoskeletal:        General: Normal range of motion.     Cervical back: Normal range of motion and neck supple.     Comments: Rises slowly form sitting to standing (-) SLR bil Motor strength and sensation distally intact  Lymphadenopathy:     Cervical: No cervical adenopathy.  Skin:    General: Skin is warm and dry.   Neurological:     Mental Status: She is alert and oriented to person, place, and time.     Deep Tendon Reflexes: Reflexes are normal and symmetric.  Psychiatric:        Behavior: Behavior normal.        Thought Content: Thought content normal.        Judgment: Judgment normal.    BP 121/77   Pulse 99   Temp 98.1 F (36.7 C) (Temporal)   Resp 20   Ht 5\' 5"  (1.651 m)   Wt 184 lb (83.5 kg)   SpO2 90%   BMI 30.62 kg/m   EKG- NSR-[name], FNP Chest x ray- chronic bronchitic changes-Preliminary reading by Ronnald Collum, FNP  [facility]       Assessment & Plan:  [name] comes in today with chief complaint of Medical Management of Chronic Issues   Diagnosis and orders addressed:  1. Benign hypertension Low sodium diet - lisinopril-hydrochlorothiazide (ZESTORETIC) 10-12.5 MG tablet; Take 1 tablet by mouth daily.  Dispense: 90 tablet; Refill: 1  2. Hyperlipidemia with target LDL less than 100 Low fat diet  3. Hypokalemia Labs pending  4. Aortic atherosclerosis ([place])  5. COPD (chronic obstructive pulmonary disease) with chronic bronchitis (HCC) Continue inhalers  6. Gastroesophageal reflux disease without esophagitis Avoid spicy foods Do not eat 2 hours prior to bedtime  - pantoprazole (PROTONIX) 40 MG tablet; Take 1 tablet (40 mg total) by mouth 2 (two) times daily.  Dispense: 180 tablet; Refill: 1  7. Anxiety Stress management Decreased xanax to BID - Ambulatory referral to Psychiatry - ALPRAZolam (XANAX) 0.5 MG tablet; Take 1 tablet (0.5 mg total) by mouth 3 (three) times daily as needed for anxiety.  Dispense: 60 tablet; Refill: 2  8. Recurrent major depressive disorder, in full remission ([place]) Stress management - PARoxetine (PAXIL) 20 MG tablet; Take 1 tablet (20 mg total) by mouth daily.  Dispense: 90 tablet; Refill: 1  9. Chronic midline low back pain without sciatica Moist heat rest - HYDROcodone-acetaminophen (NORCO) 10-325 MG tablet;  Take 1 tablet by mouth every 8 (eight) hours as needed.  Dispense: 90 tablet; Refill: 0 - HYDROcodone-acetaminophen (NORCO) 10-325 MG tablet; Take 1 tablet by mouth every 8 (eight) hours as needed.  Dispense: 90 tablet; Refill: 0 - HYDROcodone-acetaminophen (NORCO) 10-325 MG tablet; Take 1 tablet by mouth every 8 (eight) hours as needed.  Dispense: 90 tablet; Refill: 0  10. BMI 32.0-32.9,adult Discussed diet and exercise for person with BMI >25 Will recheck weight in 3-6 months   11. Chronic diarrhea - dicyclomine (BENTYL) 10 MG capsule; Take 1 capsule (10 mg total) by mouth 4 (four) times daily as needed for spasms.  Dispense: 360 capsule; Refill: 0 - Ambulatory referral to Gastroenterology   Labs pending Health Maintenance reviewed Diet and exercise encouraged  Follow up plan: 3 months   [name], FNP

## 2020-04-07 NOTE — Patient Instructions (Signed)

## 2020-04-13 ENCOUNTER — Encounter: Payer: Self-pay | Admitting: Internal Medicine

## 2020-04-22 ENCOUNTER — Other Ambulatory Visit: Payer: Self-pay | Admitting: Nurse Practitioner

## 2020-04-22 DIAGNOSIS — J441 Chronic obstructive pulmonary disease with (acute) exacerbation: Secondary | ICD-10-CM | POA: Diagnosis not present

## 2020-04-22 DIAGNOSIS — F419 Anxiety disorder, unspecified: Secondary | ICD-10-CM

## 2020-04-24 ENCOUNTER — Telehealth: Payer: Self-pay | Admitting: Nurse Practitioner

## 2020-04-24 NOTE — Telephone Encounter (Signed)
Spoke to [facility] and they do have the rx but it is too early to fill as she had it filled 03/29/20 and can't get the next one till 04/26/20. Pt aware.

## 2020-04-24 NOTE — Telephone Encounter (Signed)
Pt says that [facility] does not have her rx for ALPRAZolam (XANAX) 0.5 MG tablet. I told her the rx should be there. Please call in again.

## 2020-05-05 ENCOUNTER — Telehealth (HOSPITAL_COMMUNITY): Payer: Self-pay | Admitting: *Deleted

## 2020-05-05 ENCOUNTER — Telehealth: Payer: Self-pay | Admitting: Nurse Practitioner

## 2020-05-05 NOTE — Telephone Encounter (Signed)
New Referral from [place] received. Staff spoke with patient and asked if she is interest in scheduling appt and patient stated she does not want to. New Referral will have to be sent if she changes her mind by 06-05-2020. Spoke with Loma Sousa to inform them with what pt stated.

## 2020-05-21 ENCOUNTER — Telehealth (HOSPITAL_COMMUNITY): Payer: Self-pay | Admitting: *Deleted

## 2020-05-21 NOTE — Telephone Encounter (Signed)
Referral received to sch pt a new pt appt. Provider reviewed referral and stated it was a go to schedule patient but staff needed to let patient know that Xanax will not be continued before appt is made. Staff spoke with patient and informed her with that provider stated and patient declined in scheduling appt. Staff informed patient if she changes her mind later on a new referral will need to be sent for another review. Patient verbalized understanding and agreed.

## 2020-05-23 ENCOUNTER — Other Ambulatory Visit: Payer: Self-pay | Admitting: Nurse Practitioner

## 2020-05-23 DIAGNOSIS — J209 Acute bronchitis, unspecified: Secondary | ICD-10-CM

## 2020-05-23 DIAGNOSIS — J441 Chronic obstructive pulmonary disease with (acute) exacerbation: Secondary | ICD-10-CM | POA: Diagnosis not present

## 2020-05-24 ENCOUNTER — Other Ambulatory Visit: Payer: Self-pay

## 2020-05-24 ENCOUNTER — Inpatient Hospital Stay (HOSPITAL_COMMUNITY)
Admission: EM | Admit: 2020-05-24 | Discharge: 2020-05-27 | DRG: 193 | Disposition: A | Discharge status: Home / Self Care | Payer: Medicare Other | Attending: Internal Medicine | Admitting: Internal Medicine

## 2020-05-24 ENCOUNTER — Encounter (HOSPITAL_COMMUNITY): Payer: Self-pay | Admitting: *Deleted

## 2020-05-24 ENCOUNTER — Emergency Department (HOSPITAL_COMMUNITY): Payer: Medicare Other

## 2020-05-24 DIAGNOSIS — J9621 Acute and chronic respiratory failure with hypoxia: Secondary | ICD-10-CM | POA: Diagnosis not present

## 2020-05-24 DIAGNOSIS — Z66 Do not resuscitate: Secondary | ICD-10-CM | POA: Diagnosis present

## 2020-05-24 DIAGNOSIS — R7989 Other specified abnormal findings of blood chemistry: Secondary | ICD-10-CM | POA: Diagnosis present

## 2020-05-24 DIAGNOSIS — G8929 Other chronic pain: Secondary | ICD-10-CM | POA: Diagnosis not present

## 2020-05-24 DIAGNOSIS — Z7951 Long term (current) use of inhaled steroids: Secondary | ICD-10-CM | POA: Diagnosis not present

## 2020-05-24 DIAGNOSIS — J189 Pneumonia, unspecified organism: Secondary | ICD-10-CM | POA: Diagnosis not present

## 2020-05-24 DIAGNOSIS — Z9981 Dependence on supplemental oxygen: Secondary | ICD-10-CM | POA: Diagnosis not present

## 2020-05-24 DIAGNOSIS — I10 Essential (primary) hypertension: Secondary | ICD-10-CM | POA: Diagnosis present

## 2020-05-24 DIAGNOSIS — J811 Chronic pulmonary edema: Secondary | ICD-10-CM | POA: Diagnosis not present

## 2020-05-24 DIAGNOSIS — Z825 Family history of asthma and other chronic lower respiratory diseases: Secondary | ICD-10-CM

## 2020-05-24 DIAGNOSIS — F17210 Nicotine dependence, cigarettes, uncomplicated: Secondary | ICD-10-CM | POA: Diagnosis not present

## 2020-05-24 DIAGNOSIS — R404 Transient alteration of awareness: Secondary | ICD-10-CM | POA: Diagnosis not present

## 2020-05-24 DIAGNOSIS — E785 Hyperlipidemia, unspecified: Secondary | ICD-10-CM | POA: Diagnosis not present

## 2020-05-24 DIAGNOSIS — K580 Irritable bowel syndrome with diarrhea: Secondary | ICD-10-CM | POA: Diagnosis not present

## 2020-05-24 DIAGNOSIS — Z823 Family history of stroke: Secondary | ICD-10-CM

## 2020-05-24 DIAGNOSIS — J441 Chronic obstructive pulmonary disease with (acute) exacerbation: Secondary | ICD-10-CM | POA: Diagnosis not present

## 2020-05-24 DIAGNOSIS — K219 Gastro-esophageal reflux disease without esophagitis: Secondary | ICD-10-CM | POA: Diagnosis not present

## 2020-05-24 DIAGNOSIS — R739 Hyperglycemia, unspecified: Secondary | ICD-10-CM | POA: Diagnosis not present

## 2020-05-24 DIAGNOSIS — M1990 Unspecified osteoarthritis, unspecified site: Secondary | ICD-10-CM | POA: Diagnosis present

## 2020-05-24 DIAGNOSIS — Z743 Need for continuous supervision: Secondary | ICD-10-CM | POA: Diagnosis not present

## 2020-05-24 DIAGNOSIS — Z8249 Family history of ischemic heart disease and other diseases of the circulatory system: Secondary | ICD-10-CM

## 2020-05-24 DIAGNOSIS — Z79899 Other long term (current) drug therapy: Secondary | ICD-10-CM | POA: Diagnosis not present

## 2020-05-24 DIAGNOSIS — E876 Hypokalemia: Secondary | ICD-10-CM | POA: Diagnosis present

## 2020-05-24 DIAGNOSIS — J449 Chronic obstructive pulmonary disease, unspecified: Secondary | ICD-10-CM | POA: Diagnosis present

## 2020-05-24 DIAGNOSIS — Z833 Family history of diabetes mellitus: Secondary | ICD-10-CM

## 2020-05-24 DIAGNOSIS — M545 Low back pain, unspecified: Secondary | ICD-10-CM | POA: Diagnosis present

## 2020-05-24 DIAGNOSIS — K529 Noninfective gastroenteritis and colitis, unspecified: Secondary | ICD-10-CM | POA: Diagnosis not present

## 2020-05-24 DIAGNOSIS — K589 Irritable bowel syndrome without diarrhea: Secondary | ICD-10-CM | POA: Diagnosis present

## 2020-05-24 DIAGNOSIS — R05 Cough: Secondary | ICD-10-CM | POA: Diagnosis not present

## 2020-05-24 DIAGNOSIS — J440 Chronic obstructive pulmonary disease with acute lower respiratory infection: Secondary | ICD-10-CM | POA: Diagnosis not present

## 2020-05-24 DIAGNOSIS — I517 Cardiomegaly: Secondary | ICD-10-CM | POA: Diagnosis not present

## 2020-05-24 DIAGNOSIS — Z20822 Contact with and (suspected) exposure to covid-19: Secondary | ICD-10-CM | POA: Diagnosis present

## 2020-05-24 DIAGNOSIS — F419 Anxiety disorder, unspecified: Secondary | ICD-10-CM | POA: Diagnosis present

## 2020-05-24 DIAGNOSIS — F329 Major depressive disorder, single episode, unspecified: Secondary | ICD-10-CM | POA: Diagnosis present

## 2020-05-24 DIAGNOSIS — I700 Atherosclerosis of aorta: Secondary | ICD-10-CM | POA: Diagnosis not present

## 2020-05-24 DIAGNOSIS — F17200 Nicotine dependence, unspecified, uncomplicated: Secondary | ICD-10-CM

## 2020-05-24 DIAGNOSIS — R0602 Shortness of breath: Secondary | ICD-10-CM | POA: Diagnosis not present

## 2020-05-24 LAB — CBG MONITORING, ED: Glucose-Capillary: 149 mg/dL — ABNORMAL HIGH (ref 70–99)

## 2020-05-24 LAB — COMPREHENSIVE METABOLIC PANEL
ALT: 56 U/L — ABNORMAL HIGH (ref 0–44)
AST: 41 U/L (ref 15–41)
Albumin: 3.5 g/dL (ref 3.5–5.0)
Alkaline Phosphatase: 151 U/L — ABNORMAL HIGH (ref 38–126)
Anion gap: 10 (ref 5–15)
BUN: 5 mg/dL — ABNORMAL LOW (ref 8–23)
CO2: 30 mmol/L (ref 22–32)
Calcium: 9 mg/dL (ref 8.9–10.3)
Chloride: 95 mmol/L — ABNORMAL LOW (ref 98–111)
Creatinine, Ser: 0.45 mg/dL (ref 0.44–1.00)
GFR calc Af Amer: >60 mL/min (ref >60)
GFR calc non Af Amer: >60 mL/min (ref >60)
Glucose, Bld: 109 mg/dL — ABNORMAL HIGH (ref 70–99)
Potassium: 3.2 mmol/L — ABNORMAL LOW (ref 3.5–5.1)
Sodium: 135 mmol/L (ref 135–145)
Total Bilirubin: 0.4 mg/dL (ref 0.3–1.2)
Total Protein: 6.4 g/dL — ABNORMAL LOW (ref 6.5–8.1)

## 2020-05-24 LAB — BLOOD GAS, VENOUS
Acid-Base Excess: 10.3 mmol/L — ABNORMAL HIGH (ref 0.0–2.0)
Bicarbonate: 30.7 mmol/L — ABNORMAL HIGH (ref 20.0–28.0)
FIO2: 28
O2 Saturation: 45.4 %
Patient temperature: 36.9
pCO2, Ven: 67.4 mmHg — ABNORMAL HIGH (ref 44.0–60.0)
pH, Ven: 7.349 (ref 7.250–7.430)
pO2, Ven: <31 mmHg — CL (ref 32.0–45.0)

## 2020-05-24 LAB — CBC WITH DIFFERENTIAL/PLATELET
Abs Immature Granulocytes: 0.03 10*3/uL (ref 0.00–0.07)
Basophils Absolute: 0.1 10*3/uL (ref 0.0–0.1)
Basophils Relative: 1 %
Eosinophils Absolute: 0.1 10*3/uL (ref 0.0–0.5)
Eosinophils Relative: 2 %
HCT: 41.7 % (ref 36.0–46.0)
Hemoglobin: 13.3 g/dL (ref 12.0–15.0)
Immature Granulocytes: 0 %
Lymphocytes Relative: 16 %
Lymphs Abs: 1.1 10*3/uL (ref 0.7–4.0)
MCH: 28.9 pg (ref 26.0–34.0)
MCHC: 31.9 g/dL (ref 30.0–36.0)
MCV: 90.5 fL (ref 80.0–100.0)
Monocytes Absolute: 0.6 10*3/uL (ref 0.1–1.0)
Monocytes Relative: 9 %
Neutro Abs: 5 10*3/uL (ref 1.7–7.7)
Neutrophils Relative %: 72 %
Platelets: 175 10*3/uL (ref 150–400)
RBC: 4.61 MIL/uL (ref 3.87–5.11)
RDW: 14.1 % (ref 11.5–15.5)
WBC: 6.9 10*3/uL (ref 4.0–10.5)
nRBC: 0 % (ref 0.0–0.2)

## 2020-05-24 LAB — SARS CORONAVIRUS 2 BY RT PCR (HOSPITAL ORDER, PERFORMED IN ~~LOC~~ HOSPITAL LAB): SARS Coronavirus 2: NEGATIVE

## 2020-05-24 LAB — HEMOGLOBIN A1C
Hgb A1c MFr Bld: 5.7 % — ABNORMAL HIGH (ref 4.8–5.6)
Mean Plasma Glucose: 116.89 mg/dL

## 2020-05-24 MED ORDER — ALBUTEROL SULFATE (2.5 MG/3ML) 0.083% IN NEBU
5.0000 mg | INHALATION_SOLUTION | Freq: Once | RESPIRATORY_TRACT | Status: DC
  Filled 2020-05-24: qty 6

## 2020-05-24 MED ORDER — LISINOPRIL 10 MG PO TABS
10.0000 mg | ORAL_TABLET | Freq: Every day | ORAL | Status: DC
  Administered 2020-05-24 – 2020-05-27 (×4): 10 mg via ORAL
  Filled 2020-05-24 (×4): qty 1

## 2020-05-24 MED ORDER — SODIUM CHLORIDE 0.9 % IV SOLN
1.0000 g | INTRAVENOUS | Status: DC
  Administered 2020-05-25 – 2020-05-27 (×3): 1 g via INTRAVENOUS
  Filled 2020-05-24 (×3): qty 10

## 2020-05-24 MED ORDER — ADULT MULTIVITAMIN W/MINERALS CH
1.0000 | ORAL_TABLET | Freq: Every day | ORAL | Status: DC
  Administered 2020-05-24 – 2020-05-27 (×4): 1 via ORAL
  Filled 2020-05-24 (×4): qty 1

## 2020-05-24 MED ORDER — ALBUTEROL SULFATE HFA 108 (90 BASE) MCG/ACT IN AERS
2.0000 | INHALATION_SPRAY | Freq: Once | RESPIRATORY_TRACT | Status: AC
  Administered 2020-05-24: 2 via RESPIRATORY_TRACT
  Filled 2020-05-24: qty 6.7

## 2020-05-24 MED ORDER — INSULIN ASPART 100 UNIT/ML ~~LOC~~ SOLN: 0.0000 [IU] | Freq: Every day | SUBCUTANEOUS | Status: DC

## 2020-05-24 MED ORDER — PAROXETINE HCL 20 MG PO TABS
20.0000 mg | ORAL_TABLET | Freq: Every day | ORAL | Status: DC
  Administered 2020-05-24 – 2020-05-27 (×4): 20 mg via ORAL
  Filled 2020-05-24 (×5): qty 1

## 2020-05-24 MED ORDER — PREDNISONE 20 MG PO TABS
40.0000 mg | ORAL_TABLET | Freq: Every day | ORAL | Status: DC
  Administered 2020-05-25: 40 mg via ORAL
  Filled 2020-05-24: qty 2

## 2020-05-24 MED ORDER — HYDROCODONE-ACETAMINOPHEN 10-325 MG PO TABS
1.0000 | ORAL_TABLET | Freq: Three times a day (TID) | ORAL | Status: DC | PRN
  Administered 2020-05-24 – 2020-05-27 (×9): 1 via ORAL
  Filled 2020-05-24 (×10): qty 1

## 2020-05-24 MED ORDER — ENOXAPARIN SODIUM 40 MG/0.4ML ~~LOC~~ SOLN
40.0000 mg | SUBCUTANEOUS | Status: DC
  Administered 2020-05-24 – 2020-05-26 (×3): 40 mg via SUBCUTANEOUS
  Filled 2020-05-24 (×3): qty 0.4

## 2020-05-24 MED ORDER — HYDROCHLOROTHIAZIDE 12.5 MG PO CAPS
12.5000 mg | ORAL_CAPSULE | Freq: Every day | ORAL | Status: DC
  Administered 2020-05-24 – 2020-05-27 (×4): 12.5 mg via ORAL
  Filled 2020-05-24 (×4): qty 1

## 2020-05-24 MED ORDER — ASCORBIC ACID 500 MG PO TABS
1000.0000 mg | ORAL_TABLET | Freq: Every day | ORAL | Status: DC
  Administered 2020-05-24 – 2020-05-27 (×4): 1000 mg via ORAL
  Filled 2020-05-24 (×4): qty 2

## 2020-05-24 MED ORDER — FLUTICASONE PROPIONATE 50 MCG/ACT NA SUSP
2.0000 | Freq: Every day | NASAL | Status: DC | PRN
  Filled 2020-05-24: qty 16

## 2020-05-24 MED ORDER — SODIUM CHLORIDE 0.9 % IV SOLN
1.0000 g | Freq: Once | INTRAVENOUS | Status: AC
  Administered 2020-05-24: 1 g via INTRAVENOUS
  Filled 2020-05-24: qty 10

## 2020-05-24 MED ORDER — PANTOPRAZOLE SODIUM 40 MG PO TBEC
40.0000 mg | DELAYED_RELEASE_TABLET | Freq: Two times a day (BID) | ORAL | Status: DC
  Administered 2020-05-24 – 2020-05-27 (×6): 40 mg via ORAL
  Filled 2020-05-24 (×6): qty 1

## 2020-05-24 MED ORDER — IPRATROPIUM-ALBUTEROL 0.5-2.5 (3) MG/3ML IN SOLN
3.0000 mL | Freq: Four times a day (QID) | RESPIRATORY_TRACT | Status: DC
  Administered 2020-05-24 – 2020-05-27 (×11): 3 mL via RESPIRATORY_TRACT
  Filled 2020-05-24 (×12): qty 3

## 2020-05-24 MED ORDER — AZITHROMYCIN 250 MG PO TABS
500.0000 mg | ORAL_TABLET | Freq: Once | ORAL | Status: AC
  Administered 2020-05-24: 500 mg via ORAL
  Filled 2020-05-24: qty 2

## 2020-05-24 MED ORDER — LORATADINE 10 MG PO TABS: 10.0000 mg | ORAL_TABLET | Freq: Every day | ORAL | Status: DC | PRN

## 2020-05-24 MED ORDER — METHYLPREDNISOLONE SODIUM SUCC 125 MG IJ SOLR
125.0000 mg | Freq: Once | INTRAMUSCULAR | Status: AC
  Administered 2020-05-24: 125 mg via INTRAVENOUS
  Filled 2020-05-24: qty 2

## 2020-05-24 MED ORDER — FLUTICASONE FUROATE-VILANTEROL 200-25 MCG/INH IN AEPB
1.0000 | INHALATION_SPRAY | Freq: Every day | RESPIRATORY_TRACT | Status: DC
  Administered 2020-05-25 – 2020-05-27 (×3): 1 via RESPIRATORY_TRACT
  Filled 2020-05-24: qty 28

## 2020-05-24 MED ORDER — INSULIN ASPART 100 UNIT/ML ~~LOC~~ SOLN
0.0000 [IU] | Freq: Three times a day (TID) | SUBCUTANEOUS | Status: DC
  Administered 2020-05-24: 2 [IU] via SUBCUTANEOUS

## 2020-05-24 MED ORDER — ALBUTEROL SULFATE (2.5 MG/3ML) 0.083% IN NEBU
5.0000 mg | INHALATION_SOLUTION | Freq: Once | RESPIRATORY_TRACT | Status: AC
  Administered 2020-05-24: 5 mg via RESPIRATORY_TRACT

## 2020-05-24 MED ORDER — ALPRAZOLAM 0.5 MG PO TABS
0.5000 mg | ORAL_TABLET | Freq: Three times a day (TID) | ORAL | Status: DC | PRN
  Administered 2020-05-24 – 2020-05-27 (×7): 0.5 mg via ORAL
  Filled 2020-05-24 (×7): qty 1

## 2020-05-24 MED ORDER — CALCIUM CARBONATE 1250 (500 CA) MG PO TABS
625.0000 mg | ORAL_TABLET | Freq: Every day | ORAL | Status: DC
  Filled 2020-05-24 (×2): qty 0.5

## 2020-05-24 MED ORDER — CALCIUM CARBONATE 1250 (500 CA) MG PO TABS
1.0000 | ORAL_TABLET | Freq: Every day | ORAL | Status: DC
  Administered 2020-05-24 – 2020-05-27 (×4): 500 mg via ORAL
  Filled 2020-05-24 (×5): qty 1

## 2020-05-24 MED ORDER — NICOTINE 21 MG/24HR TD PT24
21.0000 mg | MEDICATED_PATCH | Freq: Every day | TRANSDERMAL | Status: DC
  Administered 2020-05-24 – 2020-05-27 (×4): 21 mg via TRANSDERMAL
  Filled 2020-05-24 (×4): qty 1

## 2020-05-24 MED ORDER — INSULIN ASPART 100 UNIT/ML ~~LOC~~ SOLN
3.0000 [IU] | Freq: Three times a day (TID) | SUBCUTANEOUS | Status: DC
  Administered 2020-05-24 – 2020-05-27 (×4): 3 [IU] via SUBCUTANEOUS

## 2020-05-24 MED ORDER — POTASSIUM CHLORIDE CRYS ER 20 MEQ PO TBCR
60.0000 meq | EXTENDED_RELEASE_TABLET | Freq: Once | ORAL | Status: AC
  Administered 2020-05-24: 60 meq via ORAL
  Filled 2020-05-24: qty 3

## 2020-05-24 MED ORDER — ALBUTEROL SULFATE (2.5 MG/3ML) 0.083% IN NEBU
2.5000 mg | INHALATION_SOLUTION | RESPIRATORY_TRACT | Status: DC | PRN
  Administered 2020-05-24 (×2): 2.5 mg via RESPIRATORY_TRACT
  Filled 2020-05-24 (×2): qty 3

## 2020-05-24 MED ORDER — AZITHROMYCIN 250 MG PO TABS
500.0000 mg | ORAL_TABLET | Freq: Every day | ORAL | Status: DC
  Administered 2020-05-25 – 2020-05-27 (×3): 500 mg via ORAL
  Filled 2020-05-24 (×3): qty 2

## 2020-05-24 MED ORDER — DICYCLOMINE HCL 10 MG PO CAPS
10.0000 mg | ORAL_CAPSULE | Freq: Four times a day (QID) | ORAL | Status: DC | PRN
  Administered 2020-05-26 – 2020-05-27 (×2): 10 mg via ORAL
  Filled 2020-05-24 (×3): qty 1

## 2020-05-24 MED ORDER — LISINOPRIL-HYDROCHLOROTHIAZIDE 10-12.5 MG PO TABS: 1.0000 | ORAL_TABLET | Freq: Every day | ORAL | Status: DC

## 2020-05-24 NOTE — ED Provider Notes (Signed)
[facility] EMERGENCY DEPARTMENT Provider Note   CSN: [id number] Arrival date & time: 05/24/20  8657     History Chief Complaint  Patient presents with  . Shortness of Breath    [name] is a 74 y.o. female.  HPI   This patient is a 74 year old female, she has a history of COPD as well as a history of hypertension and hypercholesterolemia.  She is followed by Chevis Pretty, nurse practitioner.  She does not have a pulmonologist.  The patient is complaining today of having shortness of breath, increasing coughing with a slight increase in the amount of phlegm.  She is supposed to use oxygen at home but states that she only uses it as needed as she does not like to wear it.  Over the last week she has had gradual onset of increasing shortness of breath which is gradually worsening and has now become severe prompting her visit today.  She arrives by paramedic transport hypoxic to 85 to 87% on room air and required increased oxygen by nasal cannula.  She states that she uses her albuterol treatments occasionally at home, has not felt like they have been helping that much this week.  She is unvaccinated for COVID-19, she has not had fevers or chills and denies any swelling of her legs.  She lives by herself, she has not had anybody around her who has been sick.  I have personally reviewed the medical record.  The patient was seen most recently for hypertension in her family doctor's office about 6 weeks ago.  She continues to smoke cigarettes  Past Medical History:  Diagnosis Date  . Angina    with indigestion  . Anxiety   . Arthritis    all over esp. in joints  . Complication of anesthesia   . COPD (chronic obstructive pulmonary disease) ([place])   . Depression   . Family history of adverse reaction to anesthesia   . GERD (gastroesophageal reflux disease)   . Headache(784.0)    normal h/a  . HPV in female   . HTN (hypertension)   . Hypercholesteremia   .  Hypercholesterolemia   . IBS (irritable bowel syndrome)   . Pneumonia 2007  . PONV (postoperative nausea and vomiting)   . S/P colonoscopy 05/30/2008   Dr. Deatra Ina: (records reviewed by AS, NP) findings normal to cecum, scattered diverticula in sigmoid colon   . S/P endoscopy 05/15/2008   Dr. Deatra Ina: (records reviewed by AS,NP): [facility] dilator for stricture distal esophagus, also noted duodenal stricture, op notes state unable to pass 17mm scope through pylorus  . Shortness of breath    with exertion  . Stomach ulcer     Patient Active Problem List   Diagnosis Date Noted  . Aortic atherosclerosis ([place]) 08/03/2017  . Smoker 07/28/2016  . BMI 32.0-32.9,adult 06/23/2015  . Depression 02/23/2015  . Hyperlipidemia with target LDL less than 100 02/23/2015  . COPD (chronic obstructive pulmonary disease) with chronic bronchitis ([place]) 11/26/2012  . Anxiety 11/26/2012  . Hypokalemia 11/26/2012  . Benign hypertension 11/26/2012  . Chronic lower back pain 11/26/2012  . GERD (gastroesophageal reflux disease) 12/29/2011  . Chronic diarrhea 11/09/2011    Past Surgical History:  Procedure Laterality Date  . APPENDECTOMY     74 years old  . BACK SURGERY     X 2  . BIOPSY  08/11/2016   Procedure: BIOPSY;  Surgeon: Daneil Dolin, MD;  Location: [facility];  Service: Endoscopy;;  gastric  bx's  . BREAST SURGERY     biopsy only  . CARPAL TUNNEL RELEASE     right hand  . CERVICAL BIOPSY  W/ LOOP ELECTRODE EXCISION  02/2011   CIN-2 with focal ectocervical margin involvement. With CIN-1 ECC negative  . CHOLECYSTECTOMY  1984  . ESOPHAGOGASTRODUODENOSCOPY  12/01/2011   Hiatal hernia/Mild fibrotic pyloric stenosis status post dilation with passage of  the scope.  The previously noted gastric ulcer completely healed  The remainder of the gastric mucosa appeared normal  . ESOPHAGOGASTRODUODENOSCOPY (EGD) WITH PROPOFOL N/A 08/11/2016   Procedure: ESOPHAGOGASTRODUODENOSCOPY (EGD) WITH PROPOFOL;   Surgeon: Daneil Dolin, MD;  Location: [facility];  Service: Endoscopy;  Laterality: N/A;  2:15 PM  . FRACTURE SURGERY  10/18/2016   left knee in 3 places  . MALONEY DILATION  07/28/2011   Schatzki's ring s/p 46F with small UES tear as well, small hh, 1cm pyloric channel ulcer, bx benign without H.Pylori. Mobic/goody's at time.  Marland Kitchen MALONEY DILATION N/A 08/11/2016   Procedure: Venia Minks DILATION;  Surgeon: Daneil Dolin, MD;  Location: [facility];  Service: Endoscopy;  Laterality: N/A;  . ORIF PATELLA Left 10/19/2016   Procedure: OPEN REDUCTION INTERNAL (ORIF) FIXATION PATELLA;  Surgeon: Carole Civil, MD;  Location: [facility] ORS;  Service: Orthopedics;  Laterality: Left;  . SPINE SURGERY  2009   cervical disc  . SPINE SURGERY  2009   lumbar  . TUBAL LIGATION  1976  . tumor removed from arm       OB History    Gravida  4   Para  4   Term      Preterm      AB      Living  5     SAB      TAB      Ectopic      Multiple      Live Births              Family History  Problem Relation Age of Onset  . Heart disease Father        CHF  . Heart disease Mother   . COPD Sister   . Diabetes Sister   . Neuropathy Sister   . COPD Sister   . COPD Sister   . Cancer Sister   . Stroke Brother   . Heart disease Brother   . Colon cancer Neg Hx   . Anesthesia problems Neg Hx   . Hypotension Neg Hx   . Malignant hyperthermia Neg Hx   . Pseudochol deficiency Neg Hx   . Colon polyps Neg Hx     Social History   Tobacco Use  . Smoking status: Current Every Day Smoker    Packs/day: 1.00    Years: 50.00    Pack years: 50.00    Types: Cigarettes  . Smokeless tobacco: Never Used  . Tobacco comment: since teenager  Vaping Use  . Vaping Use: Never used  Substance Use Topics  . Alcohol use: No    Comment: hx of ETOH abuse in past, none in 10 years  . Drug use: No    Home Medications Prior to Admission medications   Medication Sig Start Date End Date Taking?  Authorizing Provider  albuterol (PROAIR HFA) 108 (90 Base) MCG/ACT inhaler Inhale 2 puffs by mouth every 6 hours as needed for wheezing or shortness of breath 09/06/18   Hassell Done, Mary-Margaret, FNP  ALPRAZolam Duanne Moron) 0.5 MG tablet Take 1 tablet (  0.5 mg total) by mouth 3 (three) times daily as needed for anxiety. 04/07/20   Hassell Done Mary-Margaret, FNP  Aspirin-Acetaminophen-Caffeine (GOODYS EXTRA STRENGTH) [phone] MG PACK Take 1 packet by mouth every 8 (eight) hours as needed (for pain.).    [provider]  calcium carbonate (OS-CAL) 600 MG TABS Take 600 mg by mouth daily.     [provider]  Cholecalciferol (VITAMIN D3) 1000 UNITS CAPS Take 3,000 mg by mouth daily.     [provider]  dicyclomine (BENTYL) 10 MG capsule Take 1 capsule (10 mg total) by mouth 4 (four) times daily as needed for spasms. 04/07/20   Hassell Done, Mary-Margaret, FNP  diphenhydrAMINE (BENADRYL) 25 MG tablet Take 25 mg by mouth 2 (two) times daily as needed. For Allergies    [provider]  fluticasone (FLONASE) 50 MCG/ACT nasal spray SPRAY 2 SPRAYS INTO EACH NOSTRIL EVERY DAY 03/22/19   Hassell Done, Mary-Margaret, FNP  fluticasone furoate-vilanterol (BREO ELLIPTA) 200-25 MCG/INH AEPB Inhale 1 puff into the lungs daily. 01/09/20   Hassell Done Mary-Margaret, FNP  HYDROcodone-acetaminophen (NORCO) 10-325 MG tablet Take 1 tablet by mouth every 8 (eight) hours as needed. 06/10/20 07/10/20  Hassell Done Mary-Margaret, FNP  HYDROcodone-acetaminophen (NORCO) 10-325 MG tablet Take 1 tablet by mouth every 8 (eight) hours as needed. 05/09/20 06/08/20  Hassell Done Mary-Margaret, FNP  HYDROcodone-acetaminophen (NORCO) 10-325 MG tablet Take 1 tablet by mouth every 8 (eight) hours as needed. 04/09/20 05/09/20  Hassell Done, Mary-Margaret, FNP  ipratropium (ATROVENT) 0.02 % nebulizer solution Take 2.5 mLs (0.5 mg total) by nebulization every 6 (six) hours as needed for wheezing or shortness of breath. 04/15/19   Hassell Done, Mary-Margaret, FNP    levalbuterol Penne Lash) 1.25 MG/0.5ML nebulizer solution Take 1.25 mg by nebulization every 6 (six) hours as needed for wheezing or shortness of breath. 01/12/18   Hassell Done, Mary-Margaret, FNP  lisinopril-hydrochlorothiazide (ZESTORETIC) 10-12.5 MG tablet Take 1 tablet by mouth daily. 04/07/20   Hassell Done Mary-Margaret, FNP  Multiple Vitamin (MULITIVITAMIN WITH MINERALS) TABS Take 1 tablet by mouth daily.    [provider]  Nebulizers (COMPRESSOR/NEBULIZER) MISC Please use with nebulizer solution every 6 hours as needed for shortness of breath or wheezing. 11/21/17   Manuella Ghazi, Pratik D, DO  pantoprazole (PROTONIX) 40 MG tablet Take 1 tablet (40 mg total) by mouth 2 (two) times daily. 04/07/20   Hassell Done, Mary-Margaret, FNP  PARoxetine (PAXIL) 20 MG tablet Take 1 tablet (20 mg total) by mouth daily. 04/07/20   Hassell Done Mary-Margaret, FNP  vitamin C (ASCORBIC ACID) 500 MG tablet Take 1,000 mg by mouth daily.     [provider]    Allergies    Meloxicam  Review of Systems   Review of Systems  All other systems reviewed and are negative.   Physical Exam Updated Vital Signs Ht 1.651 m (5\' 5" )   Wt 83.9 kg   BMI 30.79 kg/m   Physical Exam Vitals and nursing note reviewed.  Constitutional:      General: She is in acute distress.     Appearance: She is well-developed. She is ill-appearing.  HENT:     Head: Normocephalic and atraumatic.     Mouth/Throat:     Pharynx: No oropharyngeal exudate.  Eyes:     General: No scleral icterus.       Right eye: No discharge.        Left eye: No discharge.     Conjunctiva/sclera: Conjunctivae normal.     Pupils: Pupils are equal, round, and reactive to light.  Neck:     Thyroid: No thyromegaly.     Vascular: No JVD.     Comments: No JVD Cardiovascular:     Rate and Rhythm: Normal rate and regular rhythm.     Heart sounds: Normal heart sounds. No murmur heard.  No friction rub. No gallop.   Pulmonary:     Effort: Respiratory distress  present.     Breath sounds: Wheezing present. No rales.     Comments: The patient has slight increased work of breathing, she speaks in shortened sentences, she has diffuse expiratory wheezing in all lung fields without any rales.  She does not have any retractions. Abdominal:     General: Bowel sounds are normal. There is no distension.     Palpations: Abdomen is soft. There is no mass.     Tenderness: There is no abdominal tenderness.  Musculoskeletal:        General: No tenderness. Normal range of motion.     Cervical back: Normal range of motion and neck supple.     Comments: No peripheral edema  Lymphadenopathy:     Cervical: No cervical adenopathy.  Skin:    General: Skin is warm and dry.     Findings: No erythema or rash.  Neurological:     Mental Status: She is alert.     Coordination: Coordination normal.     Comments: Normal neurologic function to the left upper extremity  Psychiatric:        Behavior: Behavior normal.     ED Results / Procedures / Treatments   Labs (all labs ordered are listed, but only abnormal results are displayed) Labs Reviewed  SARS CORONAVIRUS 2 BY RT PCR (HOSPITAL ORDER, [facility])  CBC WITH DIFFERENTIAL/PLATELET  COMPREHENSIVE METABOLIC PANEL    EKG None  Radiology DG Chest Port 1 View  Result Date: 05/24/2020 CLINICAL DATA:  Cough and shortness of breath. EXAM: PORTABLE CHEST 1 VIEW COMPARISON:  April 07, 2020 FINDINGS: Stable cardiomegaly. The hila and mediastinum are unchanged. No pneumothorax. Increased interstitial markings. Mildly more focal opacity in the right base. No other acute abnormalities. IMPRESSION: 1. Increased interstitial markings bilaterally suggestive of bronchitic change or atypical infection. More focal infiltrate appears to be developing in the right base, possibly developing focal pneumonia. Recommend short-term follow-up imaging to ensure resolution. Electronically Signed   By: Dorise Bullion III M.D   On: 05/24/2020 08:26    Procedures Procedures (including critical care time)  Medications Ordered in ED Medications  methylPREDNISolone sodium succinate (SOLU-MEDROL) 125 mg/2 mL injection 125 mg (has no administration in time range)  albuterol (PROVENTIL) (2.5 MG/3ML) 0.083% nebulizer solution 5 mg (has no administration in time range)  albuterol (VENTOLIN HFA) 108 (90 Base) MCG/ACT inhaler 2 puff (has no administration in time range)    ED Course  I have reviewed the triage vital signs and the nursing notes.  Pertinent labs & imaging results that were available during my care of the patient were reviewed by me and considered in my medical decision making (see chart for details).    MDM Rules/Calculators/A&P                          This patient's symptoms certainly lead I to think that this is COPD.  She could have underlying pneumonia or COVID-19.  That being said with her diffuse expiratory wheezing hypoxia and increased work of breathing she may need to be  admitted to the hospital if she does not improve significantly with steroids and with wheezing.  She has an additional complaint of some left hand tingling and arm pain with neck pain, has had prior pinched nerves in her neck in the past and feels this is similar.  The steroid should treat this as well.  Functionally she has no weakness or numbness in that left hand.  This patient received multiple treatments of bronchodilator therapy in the emergency department, antibiotics for developing pneumonia, of note she had a metabolic panel which was reassuring, blood glass which showed some hypoxia but otherwise no significant acidosis, CBC without leukocytosis and a BNP of 73  Continued to be hypoxic throughout the stay requiring admission to the hospital, Covid negative, discussed with hospitalist for admission  GURPREET MARIANI was evaluated in Emergency Department on 05/24/2020 for the symptoms described in the  history of present illness. She was evaluated in the context of the global COVID-19 pandemic, which necessitated consideration that the patient might be at risk for infection with the SARS-CoV-2 virus that causes COVID-19. Institutional protocols and algorithms that pertain to the evaluation of patients at risk for COVID-19 are in a state of rapid change based on information released by regulatory bodies including the CDC and federal and state organizations. These policies and algorithms were followed during the patient's care in the ED.   Final Clinical Impression(s) / ED Diagnoses Final diagnoses:  CAP (community acquired pneumonia)      Noemi Chapel, MD 05/25/20 [phone]

## 2020-05-24 NOTE — ED Notes (Signed)
CRITICAL VALUE ALERT  Critical Value: po2 less than 32  Date & Time Notied:  0911 05/24/20  Provider Notified: Sabra Heck  Orders Received/Actions taken:

## 2020-05-24 NOTE — Progress Notes (Signed)
Blood glucose 168 mg/dL at 2103.

## 2020-05-24 NOTE — H&P (Addendum)
History and Physical  [place] YOK:[id number] DOB: January 23, 1946 DOA: 05/24/2020  PCP: Chevis Pretty, FNP  Patient coming from: Home   I have personally briefly reviewed patient's old medical records in [place]  Chief Complaint: shortness of breath   HPI: [name] is a 74 y.o. female with medical history significant for COPD on home oxygen, tobacco use smokes 1 ppd cigs, GERD, HTN, depression and anxiety presented by EMS with shortness of breath.  The patient reports having nonproductive cough and wheezing for the past week.  In the last 2 days she has had increasing shortness of breath.  She has home oxygen but only uses as needed.  EMS found patient to be very short of breath and was 85-87% on room air.  She was placed on 2L oxygen and her sats improved to 95%.  Pt reports that she is unvaccinated for Covid 19.   ED Course: afebrile, RR 21, HR 67, BP 136/98.  Venous blood gas: pH 7.349, pCO2 67, pO2<31.  Pt was coughing with mucus production.  K 3.2, glucose 109, Alk phos 151, ALT 56, WBC 6.9, Hg 13.3, Platelets 175, sars 2 coronavirus test negative, EKG with normal sinus rhythm, CXR with bronchitic changes and early right focal pneumonia.  Pt was started on IV antibiotics, nebulizers, steroids and monitored.  After several hours she was noted to have ongoing difficulty breathing, wheezing and coughing.  Admission was requested for further management.     Review of Systems: As per HPI otherwise 10 point review of systems negative.   Past Medical History:  Diagnosis Date   Angina    with indigestion   Anxiety    Arthritis    all over esp. in joints   Complication of anesthesia    COPD (chronic obstructive pulmonary disease) (HCC)    Depression    Family history of adverse reaction to anesthesia    GERD (gastroesophageal reflux disease)    Headache(784.0)    normal h/a   HPV in female    HTN (hypertension)     Hypercholesteremia    Hypercholesterolemia    IBS (irritable bowel syndrome)    Pneumonia 2007   PONV (postoperative nausea and vomiting)    S/P colonoscopy 05/30/2008   Dr. Deatra Ina: (records reviewed by AS, NP) findings normal to cecum, scattered diverticula in sigmoid colon    S/P endoscopy 05/15/2008   Dr. Deatra Ina: (records reviewed by AS,NP): [facility] dilator for stricture distal esophagus, also noted duodenal stricture, op notes state unable to pass 14m scope through pylorus   Shortness of breath    with exertion   Stomach ulcer     Past Surgical History:  Procedure Laterality Date   APPENDECTOMY     74years old   BACK SURGERY     X 2   BIOPSY  08/11/2016   Procedure: BIOPSY;  Surgeon: RDaneil Dolin MD;  [facility];  Service: Endoscopy;;  gastric bx's   BREAST SURGERY     biopsy only   CARPAL TUNNEL RELEASE     right hand   CERVICAL BIOPSY  W/ LOOP ELECTRODE EXCISION  02/2011   CIN-2 with focal ectocervical margin involvement. With CIN-1 ECC negative   CHOLECYSTECTOMY  1984   ESOPHAGOGASTRODUODENOSCOPY  12/01/2011   Hiatal hernia/Mild fibrotic pyloric stenosis status post dilation with passage of  the scope.  The previously noted gastric ulcer completely healed  The remainder of the gastric mucosa appeared  normal   ESOPHAGOGASTRODUODENOSCOPY (EGD) WITH PROPOFOL N/A 08/11/2016   Procedure: ESOPHAGOGASTRODUODENOSCOPY (EGD) WITH PROPOFOL;  Surgeon: Daneil Dolin, MD;  Location[facility];  Service: Endoscopy;  Laterality: N/A;  2:15 PM   FRACTURE SURGERY  10/18/2016   left knee in 3 places   [place]  07/28/2011   Schatzki's ring s/p 66F with small UES tear as well, small hh, 1cm pyloric channel ulcer, bx benign without H.Pylori. Mobic/goody's at time.   MALONEY DILATION N/A 08/11/2016   Procedure: Venia Minks DILATION;  Surgeon: Daneil Dolin, MD;  Location: [facility];  Service: Endoscopy;  Laterality: N/A;   ORIF PATELLA Left  10/19/2016   Procedure: OPEN REDUCTION INTERNAL (ORIF) FIXATION PATELLA;  Surgeon: Carole Civil, MD;  Location: [facility];  Service: Orthopedics;  Laterality: Left;   SPINE SURGERY  2009   cervical disc   SPINE SURGERY  2009   lumbar   TUBAL LIGATION  1976   tumor removed from arm       reports that she has been smoking cigarettes. She has a 50.00 pack-year smoking history. She has never used smokeless tobacco. She reports that she does not drink alcohol and does not use drugs.  Allergies  Allergen Reactions   Meloxicam Other (See Comments)    Stomach ulcers    Family History  Problem Relation Age of Onset   Heart disease Father        CHF   Heart disease Mother    COPD Sister    Diabetes Sister    Neuropathy Sister    COPD Sister    COPD Sister    Cancer Sister    Stroke Brother    Heart disease Brother    Colon cancer Neg Hx    Anesthesia problems Neg Hx    Hypotension Neg Hx    Malignant hyperthermia Neg Hx    Pseudochol deficiency Neg Hx    Colon polyps Neg Hx     Prior to Admission medications   Medication Sig Start Date End Date Taking? Authorizing Provider  albuterol (PROAIR HFA) 108 (90 Base) MCG/ACT inhaler Inhale 2 puffs by mouth every 6 hours as needed for wheezing or shortness of breath 09/06/18  Yes Hassell Done, Mary-Margaret, FNP  ALPRAZolam Duanne Moron) 0.5 MG tablet Take 1 tablet (0.5 mg total) by mouth 3 (three) times daily as needed for anxiety. 04/07/20  Yes Martin, Mary-Margaret, FNP  Aspirin-Acetaminophen-Caffeine (GOODYS EXTRA STRENGTH) [phone] MG PACK Take 1 packet by mouth every 8 (eight) hours as needed (for pain.).   Yes [provider]  calcium carbonate (OS-CAL) 600 MG TABS Take 600 mg by mouth daily.    Yes [provider]  Cholecalciferol (VITAMIN D3) 1000 UNITS CAPS Take 3,000 mg by mouth daily.    Yes [provider]  dicyclomine (BENTYL) 10 MG capsule Take 1 capsule (10 mg total) by mouth 4 (four)  times daily as needed for spasms. 04/07/20  Yes Martin, Mary-Margaret, FNP  diphenhydrAMINE (BENADRYL) 25 MG tablet Take 25 mg by mouth 2 (two) times daily as needed. For Allergies   Yes [provider]  fluticasone furoate-vilanterol (BREO ELLIPTA) 200-25 MCG/INH AEPB Inhale 1 puff into the lungs daily. 01/09/20  Yes Hassell Done, Mary-Margaret, FNP  HYDROcodone-acetaminophen (NORCO) 10-325 MG tablet Take 1 tablet by mouth every 8 (eight) hours as needed. 06/10/20 07/10/20 Yes Martin, Mary-Margaret, FNP  ipratropium (ATROVENT) 0.02 % nebulizer solution Take 2.5 mLs (0.5 mg total) by nebulization every 6 (six) hours as  needed for wheezing or shortness of breath. 04/15/19  Yes Hassell Done, Mary-Margaret, FNP  levalbuterol Penne Lash) 1.25 MG/0.5ML nebulizer solution Take 1.25 mg by nebulization every 6 (six) hours as needed for wheezing or shortness of breath. 01/12/18  Yes Martin, Mary-Margaret, FNP  lisinopril-hydrochlorothiazide (ZESTORETIC) 10-12.5 MG tablet Take 1 tablet by mouth daily. 04/07/20  Yes Hassell Done, Mary-Margaret, FNP  Multiple Vitamin (MULITIVITAMIN WITH MINERALS) TABS Take 1 tablet by mouth daily.   Yes [provider]  pantoprazole (PROTONIX) 40 MG tablet Take 1 tablet (40 mg total) by mouth 2 (two) times daily. 04/07/20  Yes Martin, Mary-Margaret, FNP  PARoxetine (PAXIL) 20 MG tablet Take 1 tablet (20 mg total) by mouth daily. 04/07/20  Yes Martin, Mary-Margaret, FNP  Phenazopyridine HCl (AZO DINE PO) Take 1-2 tablets by mouth.   Yes [provider]  vitamin C (ASCORBIC ACID) 500 MG tablet Take 1,000 mg by mouth daily.    Yes [provider]  fluticasone (FLONASE) 50 MCG/ACT nasal spray SPRAY 2 SPRAYS INTO EACH NOSTRIL EVERY DAY 03/22/19   Hassell Done, Mary-Margaret, FNP  HYDROcodone-acetaminophen (NORCO) 10-325 MG tablet Take 1 tablet by mouth every 8 (eight) hours as needed. 04/09/20 05/09/20  Chevis Pretty, FNP   Physical Exam: Vitals:   05/24/20 0738 05/24/20 0800 05/24/20  0900 05/24/20 0930  BP: (!) 142/77 (!) 136/98 (!) 157/74 (!) 144/86  Pulse: 77 67 68 65  Resp: 18 (!) _0 Temp: 98.4 F (36.9 C)     TempSrc: Oral     SpO2: (!) 86% 95% 94% 94%  Weight:      Height:       Constitutional: NAD, calm, lying supine on gurney.  Eyes: PERRL, lids and conjunctivae normal ENMT: Mucous membranes are moist. Posterior pharynx clear of any exudate or lesions.Normal dentition.  Neck: normal, supple, no masses, no thyromegaly Respiratory: mild tachypnea, rales RLL, diffuse expiratory wheezing, no crackles.  No accessory muscle use.  Cardiovascular: Regular rate and rhythm, no murmurs / rubs / gallops. No extremity edema. 2+ pedal pulses. No carotid bruits.  Abdomen: no tenderness, no masses palpated. No hepatosplenomegaly. Bowel sounds positive.  Musculoskeletal: no clubbing / cyanosis. No joint deformity upper and lower extremities. Good ROM, no contractures. Normal muscle tone.  Skin: no rashes, lesions, ulcers. No induration Neurologic: CN 2-12 grossly intact. Sensation intact, DTR normal. Strength 5/5 in all 4.  Psychiatric: Normal judgment and insight. Alert and oriented x 3. Normal mood.   Labs on Admission: I have personally reviewed following labs and imaging studies  CBC: Recent Labs  Lab 05/24/20 0810  WBC 6.9  NEUTROABS 5.0  HGB 13.3  HCT 41.7  MCV 90.5  PLT 511   Basic Metabolic Panel: Recent Labs  Lab 05/24/20 0810  NA 135  K 3.2*  CL 95*  CO2 30  GLUCOSE 109*  BUN 5*  CREATININE 0.45  CALCIUM 9.0   GFR: Estimated Creatinine Clearance: 68 mL/min (by C-G formula based on SCr of 0.45 mg/dL). Liver Function Tests: Recent Labs  Lab 05/24/20 0810  AST 41  ALT 56*  ALKPHOS 151*  BILITOT 0.4  PROT 6.4*  ALBUMIN 3.5   No results for input(s): LIPASE, AMYLASE in the last 168 hours. No results for input(s): AMMONIA in the last 168 hours. Coagulation Profile: No results for input(s): INR, PROTIME in the last 168  hours. Cardiac Enzymes: No results for input(s): CKTOTAL, CKMB, CKMBINDEX, TROPONINI in the last 168 hours. BNP (last 3 results) No results for input(s):  PROBNP in the last 8760 hours. HbA1C: No results for input(s): HGBA1C in the last 72 hours. CBG: No results for input(s): GLUCAP in the last 168 hours. Lipid Profile: No results for input(s): CHOL, HDL, LDLCALC, TRIG, CHOLHDL, LDLDIRECT in the last 72 hours. Thyroid Function Tests: No results for input(s): TSH, T4TOTAL, FREET4, T3FREE, THYROIDAB in the last 72 hours. Anemia Panel: No results for input(s): VITAMINB12, FOLATE, FERRITIN, TIBC, IRON, RETICCTPCT in the last 72 hours. Urine analysis:    Component Value Date/Time   COLORURINE YELLOW 11/26/2012 1211   APPEARANCEUR Clear 04/16/2019 1429   LABSPEC <1.005 (L) 11/26/2012 1211   PHURINE 5.5 11/26/2012 1211   GLUCOSEU Negative 04/16/2019 1429   HGBUR NEGATIVE 11/26/2012 1211   BILIRUBINUR Negative 04/16/2019 1429   KETONESUR NEGATIVE 11/26/2012 1211   PROTEINUR Negative 04/16/2019 1429   PROTEINUR NEGATIVE 11/26/2012 1211   UROBILINOGEN negative 08/09/2013 1050   UROBILINOGEN 0.2 11/26/2012 1211   NITRITE Positive (A) 04/16/2019 1429   NITRITE POSITIVE (A) 11/26/2012 1211   LEUKOCYTESUR Trace (A) 04/16/2019 1429    Radiological Exams on Admission: DG Chest Port 1 View  Result Date: 05/24/2020 CLINICAL DATA:  Cough and shortness of breath. EXAM: PORTABLE CHEST 1 VIEW COMPARISON:  April 07, 2020 FINDINGS: Stable cardiomegaly. The hila and mediastinum are unchanged. No pneumothorax. Increased interstitial markings. Mildly more focal opacity in the right base. No other acute abnormalities. IMPRESSION: 1. Increased interstitial markings bilaterally suggestive of bronchitic change or atypical infection. More focal infiltrate appears to be developing in the right base, possibly developing focal pneumonia. Recommend short-term follow-up imaging to ensure resolution. Electronically  Signed   By: Dorise Bullion III M.D   On: 05/24/2020 08:26   EKG: Independently reviewed. Normal sinus rhythm  Assessment/Plan Principal Problem:   Acute on chronic respiratory failure with hypoxia (HCC) Active Problems:   Chronic diarrhea   GERD (gastroesophageal reflux disease)   COPD (chronic obstructive pulmonary disease) with chronic bronchitis (HCC)   Anxiety   Hypokalemia   Benign hypertension   Chronic lower back pain   Hyperlipidemia with target LDL less than 100   Smoker   Aortic atherosclerosis (HCC)   DNR (do not resuscitate)   IBS (irritable bowel syndrome)   Elevated LFTs   Hyperglycemia   1. Acute respiratory failure with hypoxia - secondary to CAP and COPD exacerbation - treating with supplemental oxygen, IV steroids, IV antibiotics, scheduled neb treatments.  Respiratory therapy care.  2. COPD exacerbation - continue scheduled nebs, steroids, antibiotics and supplemental oxygen.   3. CAP - CXR with findings of early pneumonia.  Continue ceftriaxone and azithromycin.  4. GERD - protonix ordered for GI protection.  5. Hypokalemia - oral replacement, follow BMP and Magnesium.  6. Tobacco - counseled on cessation, ordered nicotine patch.  7. Chronic lower back pain with opioid dependence - resume home medication.  8. Hyperglycemia - monitor for steroid induced hyperglycemia and add insulin if necessary.  9. DNR - present on admission, continue for inpatient hospitalization.  10. Elevated LFTs - recheck in AM.  11. IBS - resume home GI meds.  12. Hypertension - resume home medications.    DVT prophylaxis: lovenox   Code Status: Full   Family Communication: none present at bedside, Pt says she preferred to call her daughter, I'll try to call daughter tomorrow with patient's permission   Disposition Plan: home   Consults called:   Admission status: INP    Irwin Brakeman MD [facility] How to contact  the [facility] Attending or Consulting provider [place] or  covering provider during after hours [place], for this patient?  1. Check the care team in [facility] and look for a) attending/consulting [facility] provider listed and b) the [facility] team listed 2. Log into [email or web address] and use [place]'s universal password to access. If you do not have the password, please contact the hospital operator. 3. Locate the [facility] provider you are looking for under [facility] and page to a number that you can be directly reached. 4. If you still have difficulty reaching the provider, please page the [facility] (Director on Call) for the Hospitalists listed on amion for assistance.   If 7PM-7AM, please contact night-coverage [email or web address] Password [facility]  05/24/2020, 10:15 AM

## 2020-05-24 NOTE — ED Triage Notes (Signed)
Pt brought in by [facility] for c/o sob x 2 days; pt was 85-87% on RA, ems arrived and placed pt on 2L O2 and pt increased to 95%; pt is on O2 at home but said she doesn't like to wear it unless she needs it; cbg 119; pt states she has been sick x one week with cough and wheezing and she is supposed to see her PCP tomorrow

## 2020-05-24 NOTE — ED Notes (Addendum)
ED TO INPATIENT HANDOFF REPORT  ED Nurse Name and Phone #:  Fabio Neighbors RN [phone]  S Name/Age/Gender [name] 74 y.o. female Room/Bed: APA01/APA01  Code Status   Code Status: DNR  Home/SNF/Other Home Patient oriented to: self, place, time and situation Is this baseline? Yes   Triage Complete: Triage complete  Chief Complaint Acute on chronic respiratory failure with hypoxia ([place]) [J96.21]  Triage Note Pt brought in by [facility] for c/o sob x 2 days; pt was 85-87% on RA, ems arrived and placed pt on 2L O2 and pt increased to 95%; pt is on O2 at home but said she doesn't like to wear it unless she needs it; cbg 119; pt states she has been sick x one week with cough and wheezing and she is supposed to see her PCP tomorrow    Allergies Allergies  Allergen Reactions  . Meloxicam Other (See Comments)    Stomach ulcers    Level of Care/Admitting Diagnosis ED Disposition    ED Disposition Condition Comment   Admit  Hospital Area: [facility] [[id number]]  Level of Care: Med-Surg [16]  Covid Evaluation: Confirmed COVID Negative  Diagnosis: Acute on chronic respiratory failure with hypoxia [facility]) [[id number]]  Admitting Physician: [place], [place]  Attending Physician: Murlean Iba [[id number]]  Estimated length of stay: past midnight tomorrow  Certification:: I certify this patient will need inpatient services for at least 2 midnights       B Medical/Surgery History Past Medical History:  Diagnosis Date  . Angina    with indigestion  . Anxiety   . Arthritis    all over esp. in joints  . Complication of anesthesia   . COPD (chronic obstructive pulmonary disease) ([place])   . Depression   . Family history of adverse reaction to anesthesia   . GERD (gastroesophageal reflux disease)   . Headache(784.0)    normal h/a  . HPV in female   . HTN (hypertension)   . Hypercholesteremia   . Hypercholesterolemia   . IBS (irritable bowel syndrome)    . Pneumonia 2007  . PONV (postoperative nausea and vomiting)   . S/P colonoscopy 05/30/2008   Dr. Deatra Ina: (records reviewed by AS, NP) findings normal to cecum, scattered diverticula in sigmoid colon   . S/P endoscopy 05/15/2008   Dr. Deatra Ina: (records reviewed by AS,NP): [facility] dilator for stricture distal esophagus, also noted duodenal stricture, op notes state unable to pass 29mm scope through pylorus  . Shortness of breath    with exertion  . Stomach ulcer    Past Surgical History:  Procedure Laterality Date  . APPENDECTOMY     74 years old  . BACK SURGERY     X 2  . BIOPSY  08/11/2016   Procedure: BIOPSY;  Surgeon: Daneil Dolin, MD;  Location: [facility];  Service: Endoscopy;;  gastric bx's  . BREAST SURGERY     biopsy only  . CARPAL TUNNEL RELEASE     right hand  . CERVICAL BIOPSY  W/ LOOP ELECTRODE EXCISION  02/2011   CIN-2 with focal ectocervical margin involvement. With CIN-1 ECC negative  . CHOLECYSTECTOMY  1984  . ESOPHAGOGASTRODUODENOSCOPY  12/01/2011   Hiatal hernia/Mild fibrotic pyloric stenosis status post dilation with passage of  the scope.  The previously noted gastric ulcer completely healed  The remainder of the gastric mucosa appeared normal  . ESOPHAGOGASTRODUODENOSCOPY (EGD) WITH PROPOFOL N/A 08/11/2016   Procedure: ESOPHAGOGASTRODUODENOSCOPY (EGD) WITH PROPOFOL;  Surgeon:  Daneil Dolin, MD;  Location: [facility];  Service: Endoscopy;  Laterality: N/A;  2:15 PM  . FRACTURE SURGERY  10/18/2016   left knee in 3 places  . MALONEY DILATION  07/28/2011   Schatzki's ring s/p 40F with small UES tear as well, small hh, 1cm pyloric channel ulcer, bx benign without H.Pylori. Mobic/goody's at time.  Marland Kitchen MALONEY DILATION N/A 08/11/2016   Procedure: Venia Minks DILATION;  Surgeon: Daneil Dolin, MD;  Location: [facility];  Service: Endoscopy;  Laterality: N/A;  . ORIF PATELLA Left 10/19/2016   Procedure: OPEN REDUCTION INTERNAL (ORIF) FIXATION PATELLA;  Surgeon:  Carole Civil, MD;  Location: [facility] ORS;  Service: Orthopedics;  Laterality: Left;  . SPINE SURGERY  2009   cervical disc  . SPINE SURGERY  2009   lumbar  . TUBAL LIGATION  1976  . tumor removed from arm       A IV Location/Drains/Wounds Patient Lines/Drains/Airways Status    Active Line/Drains/Airways    Name Placement date Placement time Site Days   Peripheral IV 05/24/20 Right Antecubital 05/24/20  0808  Antecubital  less than 1          Intake/Output Last 24 hours  Intake/Output Summary (Last 24 hours) at 05/24/2020 1536 Last data filed at 05/24/2020 1235 Gross per 24 hour  Intake 100 ml  Output --  Net 100 ml    Labs/Imaging Results for orders placed or performed during the hospital encounter of 05/24/20 (from the past 48 hour(s))  SARS Coronavirus 2 by RT PCR (hospital order, performed in [facility] hospital lab) Nasopharyngeal Nasopharyngeal Swab     Status: None   Collection Time: 05/24/20  7:54 AM   Specimen: Nasopharyngeal Swab  Result Value Ref Range   SARS Coronavirus 2 NEGATIVE NEGATIVE    Comment: (NOTE) SARS-CoV-2 target nucleic acids are NOT DETECTED.  The SARS-CoV-2 RNA is generally detectable in upper and lower respiratory specimens during the acute phase of infection. The lowest concentration of SARS-CoV-2 viral copies this assay can detect is 250 copies / mL. A negative result does not preclude SARS-CoV-2 infection and should not be used as the sole basis for treatment or other patient management decisions.  A negative result may occur with improper specimen collection / handling, submission of specimen other than nasopharyngeal swab, presence of viral mutation(s) within the areas targeted by this assay, and inadequate number of viral copies (<250 copies / mL). A negative result must be combined with clinical observations, patient history, and epidemiological information.  Fact Sheet for Patients:    StrictlyIdeas.no  Fact Sheet for Healthcare Providers: BankingDealers.co.za  This test is not yet approved or  cleared by the [place] FDA and has been authorized for detection and/or diagnosis of SARS-CoV-2 by FDA under an Emergency Use Authorization (EUA).  This EUA will remain in effect (meaning this test can be used) for the duration of the COVID-19 declaration under Section 564(b)(1) of the Act, 21 U.S.C. section 360bbb-3(b)(1), unless the authorization is terminated or revoked sooner.  Performed at [facility], [address] [place], [place] [address]   CBC with Differential/Platelet     Status: None   Collection Time: 05/24/20  8:10 AM  Result Value Ref Range   WBC 6.9 4.0 - 10.5 K/uL   RBC 4.61 3.87 - 5.11 MIL/uL   Hemoglobin 13.3 12.0 - 15.0 g/dL   HCT 41.7 36 - 46 %   MCV 90.5 80.0 - 100.0 fL   MCH  28.9 26.0 - 34.0 pg   MCHC 31.9 30.0 - 36.0 g/dL   RDW 14.1 11.5 - 15.5 %   Platelets 175 150 - 400 K/uL   nRBC 0.0 0.0 - 0.2 %   Neutrophils Relative % 72 %   Neutro Abs 5.0 1.7 - 7.7 K/uL   Lymphocytes Relative 16 %   Lymphs Abs 1.1 0.7 - 4.0 K/uL   Monocytes Relative 9 %   Monocytes Absolute 0.6 0 - 1 K/uL   Eosinophils Relative 2 %   Eosinophils Absolute 0.1 0 - 0 K/uL   Basophils Relative 1 %   Basophils Absolute 0.1 0 - 0 K/uL   Immature Granulocytes 0 %   Abs Immature Granulocytes 0.03 0.00 - 0.07 K/uL    Comment: Performed at [facility], [address], [place], [place] [address]  Comprehensive metabolic panel     Status: Abnormal   Collection Time: 05/24/20  8:10 AM  Result Value Ref Range   Sodium 135 135 - 145 mmol/L   Potassium 3.2 (L) 3.5 - 5.1 mmol/L   Chloride 95 (L) 98 - 111 mmol/L   CO2 30 22 - 32 mmol/L   Glucose, Bld 109 (H) 70 - 99 mg/dL    Comment: Glucose reference range applies only to samples taken after fasting for at least 8 hours.   BUN 5 (L) 8 - 23 mg/dL   Creatinine, Ser 0.45  0.44 - 1.00 mg/dL   Calcium 9.0 8.9 - 10.3 mg/dL   Total Protein 6.4 (L) 6.5 - 8.1 g/dL   Albumin 3.5 3.5 - 5.0 g/dL   AST 41 15 - 41 U/L   ALT 56 (H) 0 - 44 U/L   Alkaline Phosphatase 151 (H) 38 - 126 U/L   Total Bilirubin 0.4 0.3 - 1.2 mg/dL   GFR calc non Af Amer >60 >60 mL/min   GFR calc Af Amer >60 >60 mL/min   Anion gap 10 5 - 15    Comment: Performed at [facility], [address] [place], [place] [address]  Blood gas, venous (at [facility] and [facility], not at [facility])     Status: Abnormal   Collection Time: 05/24/20  8:46 AM  Result Value Ref Range   FIO2 28.00    pH, Ven 7.349 7.25 - 7.43   pCO2, Ven 67.4 (H) 44 - 60 mmHg   pO2, Ven <31.0 (LL) 32 - 45 mmHg    Comment: CRITICAL RESULT CALLED TO, READ BACK BY AND VERIFIED WITH:  ROBIN GENTRY,RN @0906  05/24/2020 KAY    Bicarbonate 30.7 (H) 20.0 - 28.0 mmol/L   Acid-Base Excess 10.3 (H) 0.0 - 2.0 mmol/L   O2 Saturation 45.4 %   Patient temperature 36.9     Comment: Performed at [facility], [address], [place], [place] [address]  CBG monitoring, ED     Status: Abnormal   Collection Time: 05/24/20 12:38 PM  Result Value Ref Range   Glucose-Capillary 149 (H) 70 - 99 mg/dL    Comment: Glucose reference range applies only to samples taken after fasting for at least 8 hours.   Comment 1 Notify RN    Comment 2 Document in Chart    DG Chest Port 1 View  Result Date: 05/24/2020 CLINICAL DATA:  Cough and shortness of breath. EXAM: PORTABLE CHEST 1 VIEW COMPARISON:  April 07, 2020 FINDINGS: Stable cardiomegaly. The hila and mediastinum are unchanged. No pneumothorax. Increased interstitial markings. Mildly more focal opacity in the right base. No  other acute abnormalities. IMPRESSION: 1. Increased interstitial markings bilaterally suggestive of bronchitic change or atypical infection. More focal infiltrate appears to be developing in the right base, possibly developing focal pneumonia. Recommend short-term follow-up imaging to ensure resolution.  Electronically Signed   By: Dorise Bullion III M.D   On: 05/24/2020 08:26    Pending Labs Unresulted Labs (From admission, onward)          Start     Ordered   05/31/20 0500  Creatinine, serum  (enoxaparin (LOVENOX)    CrCl >/= 30 ml/min)  Weekly,   R     Comments: while on enoxaparin therapy    05/24/20 1054   05/25/20 0500  Comprehensive metabolic panel  Daily,   R      05/24/20 1054   05/25/20 0500  Magnesium  Daily,   R      05/24/20 1054   05/25/20 0500  Brain natriuretic peptide  Daily,   R      05/24/20 1054   05/24/20 1055  HIV Antibody (routine testing w rflx)  (HIV Antibody (Routine testing w reflex) panel)  Once,   STAT        05/24/20 1054   05/24/20 1055  Hemoglobin A1c  Once,   STAT       Comments: To assess prior glycemic control    05/24/20 1054          Vitals/Pain Today's Vitals   05/24/20 1416 05/24/20 1430 05/24/20 1500 05/24/20 1515  BP:  127/65 133/81   Pulse:  71 73 69  Resp:  17 11 17   Temp:      TempSrc:      SpO2:  93% 94% 96%  Weight:      Height:      PainSc: Asleep       Isolation Precautions No active isolations  Medications Medications  albuterol (PROVENTIL) (2.5 MG/3ML) 0.083% nebulizer solution 5 mg (5 mg Nebulization Not Given 05/24/20 1011)  HYDROcodone-acetaminophen (NORCO) 10-325 MG per tablet 1 tablet (1 tablet Oral Given 05/24/20 1233)  ALPRAZolam (XANAX) tablet 0.5 mg (has no administration in time range)  PARoxetine (PAXIL) tablet 20 mg (20 mg Oral Given 05/24/20 1234)  dicyclomine (BENTYL) capsule 10 mg (has no administration in time range)  pantoprazole (PROTONIX) EC tablet 40 mg (40 mg Oral Not Given 05/24/20 1415)  multivitamin with minerals tablet 1 tablet (1 tablet Oral Given 05/24/20 1234)  ascorbic acid (VITAMIN C) tablet 1,000 mg (1,000 mg Oral Given 05/24/20 1234)  loratadine (CLARITIN) tablet 10 mg (has no administration in time range)  fluticasone (FLONASE) 50 MCG/ACT nasal spray 2 spray (has no administration in  time range)  fluticasone furoate-vilanterol (BREO ELLIPTA) 200-25 MCG/INH 1 puff (1 puff Inhalation Not Given 05/24/20 1309)  ipratropium-albuterol (DUONEB) 0.5-2.5 (3) MG/3ML nebulizer solution 3 mL (3 mLs Nebulization Given 05/24/20 1317)  albuterol (PROVENTIL) (2.5 MG/3ML) 0.083% nebulizer solution 2.5 mg (has no administration in time range)  enoxaparin (LOVENOX) injection 40 mg (has no administration in time range)  nicotine (NICODERM CQ - dosed in mg/24 hours) patch 21 mg (21 mg Transdermal Patch Applied 05/24/20 1239)  cefTRIAXone (ROCEPHIN) 1 g in sodium chloride 0.9 % 100 mL IVPB (has no administration in time range)  azithromycin (ZITHROMAX) tablet 500 mg (has no administration in time range)  predniSONE (DELTASONE) tablet 40 mg (has no administration in time range)  insulin aspart (novoLOG) injection 0-15 Units (2 Units Subcutaneous Given 05/24/20 1307)  insulin aspart (novoLOG) injection 0-5  Units (has no administration in time range)  insulin aspart (novoLOG) injection 3 Units (3 Units Subcutaneous Given 05/24/20 1307)  lisinopril (ZESTRIL) tablet 10 mg (10 mg Oral Given 05/24/20 1234)    And  hydrochlorothiazide (MICROZIDE) capsule 12.5 mg (12.5 mg Oral Given 05/24/20 1233)  calcium carbonate (OS-CAL - dosed in mg of elemental calcium) tablet 500 mg of elemental calcium (500 mg of elemental calcium Oral Given 05/24/20 1234)  methylPREDNISolone sodium succinate (SOLU-MEDROL) 125 mg/2 mL injection 125 mg (125 mg Intravenous Given 05/24/20 0813)  albuterol (VENTOLIN HFA) 108 (90 Base) MCG/ACT inhaler 2 puff (2 puffs Inhalation Given 05/24/20 0814)  albuterol (PROVENTIL) (2.5 MG/3ML) 0.083% nebulizer solution 5 mg (5 mg Nebulization Given 05/24/20 1010)  cefTRIAXone (ROCEPHIN) 1 g in sodium chloride 0.9 % 100 mL IVPB (0 g Intravenous Stopped 05/24/20 1235)  azithromycin (ZITHROMAX) tablet 500 mg (500 mg Oral Given 05/24/20 1056)  potassium chloride SA (KLOR-CON) CR tablet 60 mEq (60 mEq Oral Given  05/24/20 1055)    Mobility walks Low fall risk   Focused Assessments    R Recommendations: See Admitting Provider Note  Report given to:  Lattie Haw RN [phone]  Additional Notes:

## 2020-05-25 ENCOUNTER — Ambulatory Visit: Payer: Medicare Other | Admitting: Nurse Practitioner

## 2020-05-25 ENCOUNTER — Inpatient Hospital Stay (HOSPITAL_COMMUNITY): Payer: Medicare Other

## 2020-05-25 LAB — GLUCOSE, CAPILLARY
Glucose-Capillary: 145 mg/dL — ABNORMAL HIGH (ref 70–99)
Glucose-Capillary: 168 mg/dL — ABNORMAL HIGH (ref 70–99)
Glucose-Capillary: 117 mg/dL — ABNORMAL HIGH (ref 70–99)
Glucose-Capillary: 107 mg/dL — ABNORMAL HIGH (ref 70–99)
Glucose-Capillary: 110 mg/dL — ABNORMAL HIGH (ref 70–99)
Glucose-Capillary: 111 mg/dL — ABNORMAL HIGH (ref 70–99)
Glucose-Capillary: 125 mg/dL — ABNORMAL HIGH (ref 70–99)

## 2020-05-25 LAB — COMPREHENSIVE METABOLIC PANEL
ALT: 42 U/L (ref 0–44)
AST: 23 U/L (ref 15–41)
Albumin: 3.3 g/dL — ABNORMAL LOW (ref 3.5–5.0)
Alkaline Phosphatase: 121 U/L (ref 38–126)
Anion gap: 8 (ref 5–15)
BUN: 8 mg/dL (ref 8–23)
CO2: 29 mmol/L (ref 22–32)
Calcium: 9 mg/dL (ref 8.9–10.3)
Chloride: 96 mmol/L — ABNORMAL LOW (ref 98–111)
Creatinine, Ser: 0.39 mg/dL — ABNORMAL LOW (ref 0.44–1.00)
GFR calc Af Amer: >60 mL/min (ref >60)
GFR calc non Af Amer: >60 mL/min (ref >60)
Glucose, Bld: 116 mg/dL — ABNORMAL HIGH (ref 70–99)
Potassium: 3.6 mmol/L (ref 3.5–5.1)
Sodium: 133 mmol/L — ABNORMAL LOW (ref 135–145)
Total Bilirubin: 0.3 mg/dL (ref 0.3–1.2)
Total Protein: 6 g/dL — ABNORMAL LOW (ref 6.5–8.1)

## 2020-05-25 LAB — HIV ANTIBODY (ROUTINE TESTING W REFLEX): HIV Screen 4th Generation wRfx: NONREACTIVE

## 2020-05-25 LAB — BRAIN NATRIURETIC PEPTIDE: B Natriuretic Peptide: 73 pg/mL (ref 0.0–100.0)

## 2020-05-25 LAB — MAGNESIUM: Magnesium: 1.9 mg/dL (ref 1.7–2.4)

## 2020-05-25 MED ORDER — METHYLPREDNISOLONE SODIUM SUCC 125 MG IJ SOLR
60.0000 mg | Freq: Four times a day (QID) | INTRAMUSCULAR | Status: DC
  Administered 2020-05-25 – 2020-05-26 (×3): 60 mg via INTRAVENOUS
  Filled 2020-05-25 (×3): qty 2

## 2020-05-25 MED ORDER — GUAIFENESIN-DM 100-10 MG/5ML PO SYRP
5.0000 mL | ORAL_SOLUTION | ORAL | Status: DC | PRN
  Administered 2020-05-25 – 2020-05-26 (×7): 5 mL via ORAL
  Filled 2020-05-25 (×9): qty 5

## 2020-05-25 MED ORDER — AMLODIPINE BESYLATE 5 MG PO TABS
5.0000 mg | ORAL_TABLET | Freq: Every day | ORAL | Status: DC
  Administered 2020-05-25 – 2020-05-27 (×3): 5 mg via ORAL
  Filled 2020-05-25 (×3): qty 1

## 2020-05-25 NOTE — Progress Notes (Signed)
Initial Nutrition Assessment  DOCUMENTATION CODES:   Obesity unspecified  INTERVENTION:  Heart Healthy diet education   NUTRITION DIAGNOSIS:   Inadequate oral intake related to chronic illness, acute illness (COPD exacerbation/ CAP) as evidenced by per patient/family report.   GOAL:  Patient will meet greater than or equal to 90% of their needs    MONITOR:  PO intake, Skin, Weight trends, Labs    REASON FOR ASSESSMENT:   Consult Assessment of nutrition requirement/status  ASSESSMENT: Patient is an obese 74 yo female with hx of COPD with chronic bronchitis, Chronic diarrhea, GERD, HTN, HLD and tobacco smoker. She presents with acute respiratory failure, COPD exacerbation, CAP.   Meal intake 100%. Patient reports that she is eating well but is complaining about the salt restriction. Home diet is regular. She eats one main meal daily and snacks through the day. Encouraged her to adopt a regular healthy meal pattern vs 1 big meal and snacks.     Weight is up 3 kg compared to 6 weeks ago.   Medications reviewed and include: vitamin C, calcium carbonate, Insulin, methylprednisolone, MVI, Protonix. IV-rocephin   Labs: BMP Latest Ref Rng & Units 05/25/2020 05/24/2020 01/21/2020  Glucose 70 - 99 mg/dL 116(H) 109(H) 107(H)  BUN 8 - 23 mg/dL 8 5(L) 6(L)  Creatinine 0.44 - 1.00 mg/dL 0.39(L) 0.45 0.56(L)  BUN/Creat Ratio 12 - 28 - - 11(L)  Sodium 135 - 145 mmol/L 133(L) 135 140  Potassium 3.5 - 5.1 mmol/L 3.6 3.2(L) 4.1  Chloride 98 - 111 mmol/L 96(L) 95(L) 99  CO2 22 - 32 mmol/L 29 30 27   Calcium 8.9 - 10.3 mg/dL 9.0 9.0 9.4     NUTRITION - FOCUSED PHYSICAL EXAM:  Nutrition-Focused physical exam findings are orbital fat depletion, temporal muscle depletion, and no edema.     Diet Order:   Diet Order            Diet Heart Room service appropriate? Yes; Fluid consistency: Thin  Diet effective now                 EDUCATION NEEDS:  Education needs have been  addressed Skin:  Skin Assessment: Reviewed RN Assessment  Last BM:  8/22  Height:   Ht Readings from Last 1 Encounters:  05/24/20 5\' 5"  (1.651 m)    Weight:   Wt Readings from Last 1 Encounters:  05/24/20 86.6 kg    Ideal Body Weight:   57 kg  BMI:  Body mass index is 31.78 kg/m.  Estimated Nutritional Needs:   Kcal:  9323-5573  Protein:  90-97 gr  Fluid:  1.7-1.8 liters daily  Colman Cater MS,RD,CSG,LDN Pager: Shea Evans

## 2020-05-25 NOTE — Evaluation (Signed)
Physical Therapy Evaluation Patient Details Name: [name] MRN: [id number] DOB: Jul 17, 1946 Today's Date: 05/25/2020   History of Present Illness  [name] is a 74 y.o. female with medical history significant for COPD on home oxygen, tobacco use smokes 1 ppd cigs, GERD, HTN, depression and anxiety presented by EMS with shortness of breath.  The patient reports having nonproductive cough and wheezing for the past week.  In the last 2 days she has had increasing shortness of breath.  She has home oxygen but only uses as needed.  EMS found patient to be very short of breath and was 85-87% on room air.  She was placed on 2L oxygen and her sats improved to 95%.  Pt reports that she is unvaccinated for Covid 19.    Clinical Impression  Patient able to complete bed mobility and transfer to standing without physical assist but slightly slower. Patient minimally unsteady upon standing. Patient ambulates without loss of balance and no AD use on room air. Patient gradually becomes min/mod SOB while ambulating on room air. Upon returning to seated, patient O2 sat was at 91-92% which is what patient states is typical for her. Patient educated on following up with outpatient PT should any weakness persist upon discharge. Patient limited for functional mobility as stated below secondary to BLE weakness, fatigue and poor standing balance.  Patient will benefit from continued physical therapy in hospital and recommended venue below to increase strength, balance, endurance for safe ADLs and gait.     Follow Up Recommendations No PT follow up    Equipment Recommendations  None recommended by PT    Recommendations for Other Services       Precautions / Restrictions Precautions Precautions: Fall Restrictions Weight Bearing Restrictions: No      Mobility  Bed Mobility Overal bed mobility: Modified Independent             General bed mobility comments: slightly  slow  Transfers Overall transfer level: Modified independent Equipment used: None             General transfer comment: transfer to standing without AD  Ambulation/Gait Ambulation/Gait assistance: Modified independent (Device/Increase time) Gait Distance (Feet): 100 Feet Assistive device: None Gait Pattern/deviations: Step-through pattern;Decreased step length - right;Decreased step length - left;Decreased stride length Gait velocity: slightly decreased   General Gait Details: slightly slow cadence, without AD, no loss of balance, gradually increase in SOB while ambulating on room air  Stairs            Wheelchair Mobility    Modified Rankin (Stroke Patients Only)       Balance Overall balance assessment: Needs assistance Sitting-balance support: No upper extremity supported Sitting balance-Leahy Scale: Normal Sitting balance - Comments: seated EOB   Standing balance support: No upper extremity supported Standing balance-Leahy Scale: Good Standing balance comment: good/fair without AD                             Pertinent Vitals/Pain Pain Assessment: Faces Faces Pain Scale: Hurts little more Pain Location: back and legs Pain Intervention(s): Limited activity within patient's tolerance;Monitored during session;Repositioned    Home Living Family/patient expects to be discharged to:: Private residence Living Arrangements: Alone   Type of Home: Apartment Home Access: Level entry     Home Layout: One level Home Equipment: Walker - 2 wheels;Cane - single point;Shower seat      Prior Function Level of Independence: Independent  Comments: Patient states independent with ADL, short distance community ambulator, drives     Hand Dominance        Extremity/Trunk Assessment   Upper Extremity Assessment Upper Extremity Assessment: Overall WFL for tasks assessed    Lower Extremity Assessment Lower Extremity Assessment: Overall  WFL for tasks assessed    Cervical / Trunk Assessment Cervical / Trunk Assessment: Normal  Communication   Communication: No difficulties  Cognition Arousal/Alertness: Awake/alert Behavior During Therapy: WFL for tasks assessed/performed Overall Cognitive Status: Within Functional Limits for tasks assessed                                        General Comments      Exercises     Assessment/Plan    PT Assessment Patient needs continued PT services  PT Problem List Decreased strength;Decreased mobility;Decreased activity tolerance;Decreased balance;Cardiopulmonary status limiting activity       PT Treatment Interventions DME instruction;Therapeutic exercise;Gait training;Balance training;Stair training;Neuromuscular re-education;Functional mobility training;Therapeutic activities;Patient/family education    PT Goals (Current goals can be found in the Care Plan section)  Acute Rehab PT Goals Patient Stated Goal: Return home PT Goal Formulation: With patient Time For Goal Achievement: 06/01/20 Potential to Achieve Goals: Good    Frequency Min 3X/week   Barriers to discharge        Co-evaluation               AM-PAC PT "6 Clicks" Mobility  Outcome Measure Help needed turning from your back to your side while in a flat bed without using bedrails?: None Help needed moving from lying on your back to sitting on the side of a flat bed without using bedrails?: None Help needed moving to and from a bed to a chair (including a wheelchair)?: None Help needed standing up from a chair using your arms (e.g., wheelchair or bedside chair)?: None Help needed to walk in hospital room?: None Help needed climbing 3-5 steps with a railing? : A Little 6 Click Score: 23    End of Session Equipment Utilized During Treatment: Oxygen Activity Tolerance: Patient tolerated treatment well Patient left: in bed;with call bell/phone within reach Nurse Communication:  Mobility status PT Visit Diagnosis: Other abnormalities of gait and mobility (R26.89);Muscle weakness (generalized) (M62.81)    Time: 9242-6834 PT Time Calculation (min) (ACUTE ONLY): 14 min   Charges:   PT Evaluation $PT Eval Moderate Complexity: 1 Mod          3:57 PM, 05/25/20 Mearl Latin PT, DPT Physical Therapist at [facility]

## 2020-05-25 NOTE — Plan of Care (Signed)
  Problem: Acute Rehab PT Goals(only PT should resolve) Goal: Pt Will Ambulate Outcome: Progressing Flowsheets (Taken 05/25/2020 1558) Pt will Ambulate:  > 125 feet  with modified independence Goal: Pt/caregiver will Perform Home Exercise Program Outcome: Progressing Flowsheets (Taken 05/25/2020 1558) Pt/caregiver will Perform Home Exercise Program:  For increased strengthening  For improved balance  Independently  3:59 PM, 05/25/20 Mearl Latin PT, DPT Physical Therapist at [facility]

## 2020-05-25 NOTE — Progress Notes (Signed)
PROGRESS NOTE   [name]  JIR:[id number] DOB: [birth year] DOA: 05/24/2020 PCP: Chevis Pretty, FNP   Chief Complaint  Patient presents with  . Shortness of Breath    Brief Admission History:  74 y.o. female with medical history significant for COPD on home oxygen, tobacco use smokes 1 ppd cigs, GERD, HTN, depression and anxiety presented by EMS with shortness of breath.  The patient reports having nonproductive cough and wheezing for the past week.  In the last 2 days she has had increasing shortness of breath.  She has home oxygen but only uses as needed.  EMS found patient to be very short of breath and was 85-87% on room air.  She was placed on 2L oxygen and her sats improved to 95%.  Pt reports that she is unvaccinated for Covid 19.   Assessment & Plan:   Principal Problem:   Acute on chronic respiratory failure with hypoxia (HCC) Active Problems:   Chronic diarrhea   GERD (gastroesophageal reflux disease)   COPD (chronic obstructive pulmonary disease) with chronic bronchitis (HCC)   Anxiety   Hypokalemia   Benign hypertension   Chronic lower back pain   Hyperlipidemia with target LDL less than 100   Smoker   Aortic atherosclerosis (HCC)   DNR (do not resuscitate)   IBS (irritable bowel syndrome)   Elevated LFTs   Hyperglycemia  1. Acute respiratory failure with hypoxia - secondary to CAP and COPD exacerbation - treating with supplemental oxygen, IV steroids, IV antibiotics, scheduled neb treatments.  Respiratory therapy care.  I changed oral prednisone to resume IV solumedrol to begin 8/23.  2. COPD exacerbation - no significant improvement, pt remains deeply symptomatic, continue scheduled nebs, steroids, antibiotics and supplemental oxygen.   3. CAP - CXR with findings of early pneumonia.  Continue ceftriaxone and azithromycin.  4. GERD - protonix ordered for GI protection.  5. Hypokalemia - repleted by oral replacement, follow BMP and Magnesium.   6. Tobacco - counseled on cessation, ordered nicotine patch.  7. Chronic lower back pain with opioid dependence - resume home medication.  8. Hyperglycemia - monitor for steroid induced hyperglycemia and add insulin if necessary.  9. DNR - present on admission, continue for inpatient hospitalization.  10. Elevated LFTs - recheck in AM.  11. IBS - resume home GI meds.  12. Hypertension - resume home medications.   61. Unvaccinated for Covid 19 - Pt says that now she would consider taking the vaccine.  Hopefully she will take when more medically stable.    DVT prophylaxis: lovenox   Code Status: Full   Family Communication: Pt says she prefers to call her daughter, I'll try to call daughter tomorrow with patient's permission   Disposition Plan: home   Status is: Inpatient  Remains inpatient appropriate because:IV treatments appropriate due to intensity of illness or inability to take PO and Inpatient level of care appropriate due to severity of illness. Pt requires ongoing IV steroids and neb treatments for persistent respiratory failure.   Dispo:  Patient From: Home  Planned Disposition: Home  Expected discharge date: 05/26/20  Medically stable for discharge: No  Consultants:     Procedures:     Antimicrobials:  Azithromycin 8/22>> Ceftriaxone 8/22>>   Subjective: Pt reports ongoing shortness of breath and difficulty breathing, she is wheezing diffusely  Objective: Vitals:   05/25/20 0234 05/25/20 0542 05/25/20 0824 05/25/20 0830  BP:  (!) 160/81    Pulse:  73    Resp:  20    Temp:  97.7 F (36.5 C)    TempSrc:  Oral    SpO2: 98% 97% 92% 92%  Weight:      Height:        Intake/Output Summary (Last 24 hours) at 05/25/2020 1316 Last data filed at 05/24/2020 1844 Gross per 24 hour  Intake 240 ml  Output --  Net 240 ml   Filed Weights   05/24/20 0734 05/24/20 1625  Weight: 83.9 kg 86.6 kg   Examination:  General exam: sitting up in bed receiving neb  treatments, Appears calm and comfortable  Respiratory system: diffuse bilateral deep expiratory wheezing. Moderate increased work of breathing.  Cardiovascular system: normal S1 & S2 heard. No JVD, murmurs, rubs, gallops or clicks. No pedal edema. Gastrointestinal system: Abdomen is nondistended, soft and nontender. No organomegaly or masses felt. Normal bowel sounds heard. Central nervous system: Alert and oriented. No focal neurological deficits. Extremities: Symmetric 5 x 5 power. Skin: No rashes, lesions or ulcers Psychiatry: Judgement and insight appear normal. Mood & affect appropriate.   Data Reviewed: I have personally reviewed following labs and imaging studies  CBC: Recent Labs  Lab 05/24/20 0810  WBC 6.9  NEUTROABS 5.0  HGB 13.3  HCT 41.7  MCV 90.5  PLT 182    Basic Metabolic Panel: Recent Labs  Lab 05/24/20 0810 05/25/20 0554  NA 135 133*  K 3.2* 3.6  CL 95* 96*  CO2 30 29  GLUCOSE 109* 116*  BUN 5* 8  CREATININE 0.45 0.39*  CALCIUM 9.0 9.0  MG  --  1.9    GFR: Estimated Creatinine Clearance: 69 mL/min (A) (by C-G formula based on SCr of 0.39 mg/dL (L)).  Liver Function Tests: Recent Labs  Lab 05/24/20 0810 05/25/20 0554  AST 41 23  ALT 56* 42  ALKPHOS 151* 121  BILITOT 0.4 0.3  PROT 6.4* 6.0*  ALBUMIN 3.5 3.3*    CBG: Recent Labs  Lab 05/24/20 2057 05/25/20 0238 05/25/20 0749 05/25/20 1143 05/25/20 1211  GLUCAP 168* 117* 107* 111* 110*    Recent Results (from the past 240 hour(s))  SARS Coronavirus 2 by RT PCR (hospital order, performed in [facility] hospital lab) Nasopharyngeal Nasopharyngeal Swab     Status: None   Collection Time: 05/24/20  7:54 AM   Specimen: Nasopharyngeal Swab  Result Value Ref Range Status   SARS Coronavirus 2 NEGATIVE NEGATIVE Final    Comment: (NOTE) SARS-CoV-2 target nucleic acids are NOT DETECTED.  The SARS-CoV-2 RNA is generally detectable in upper and lower respiratory specimens during the acute  phase of infection. The lowest concentration of SARS-CoV-2 viral copies this assay can detect is 250 copies / mL. A negative result does not preclude SARS-CoV-2 infection and should not be used as the sole basis for treatment or other patient management decisions.  A negative result may occur with improper specimen collection / handling, submission of specimen other than nasopharyngeal swab, presence of viral mutation(s) within the areas targeted by this assay, and inadequate number of viral copies (<250 copies / mL). A negative result must be combined with clinical observations, patient history, and epidemiological information.  Fact Sheet for Patients:   StrictlyIdeas.no  Fact Sheet for Healthcare Providers: BankingDealers.co.za  This test is not yet approved or  cleared by the [place] FDA and has been authorized for detection and/or diagnosis of SARS-CoV-2 by FDA under an Emergency Use Authorization (EUA).  This EUA will remain in effect (meaning this test can  be used) for the duration of the COVID-19 declaration under Section 564(b)(1) of the Act, 21 U.S.C. section 360bbb-3(b)(1), unless the authorization is terminated or revoked sooner.  Performed at [facility], [address] [place], [place] [address]      Radiology Studies: DG Chest 2 View  Result Date: 05/25/2020 CLINICAL DATA:  Shortness of breath.  Hypertension and COPD. EXAM: CHEST - 2 VIEW COMPARISON:  05/24/2020 FINDINGS: Stable cardiac enlargement. Increase interstitial markings are identified bilaterally compatible with pulmonary vascular congestion. No focal airspace consolidation noted. No pleural effusion. Visualized osseous structures are unremarkable. Remote left posterolateral rib deformity. IMPRESSION: Cardiac enlargement and pulmonary vascular congestion. Electronically Signed   By: Kerby Moors M.D.   On: 05/25/2020 10:30   DG Chest Port 1  View  Result Date: 05/24/2020 CLINICAL DATA:  Cough and shortness of breath. EXAM: PORTABLE CHEST 1 VIEW COMPARISON:  April 07, 2020 FINDINGS: Stable cardiomegaly. The hila and mediastinum are unchanged. No pneumothorax. Increased interstitial markings. Mildly more focal opacity in the right base. No other acute abnormalities. IMPRESSION: 1. Increased interstitial markings bilaterally suggestive of bronchitic change or atypical infection. More focal infiltrate appears to be developing in the right base, possibly developing focal pneumonia. Recommend short-term follow-up imaging to ensure resolution. Electronically Signed   By: Dorise Bullion III M.D   On: 05/24/2020 08:26   Scheduled Meds: . albuterol  5 mg Nebulization Once  . amLODipine  5 mg Oral Daily  . vitamin C  1,000 mg Oral Daily  . azithromycin  500 mg Oral Daily  . calcium carbonate  1 tablet Oral Q breakfast  . enoxaparin (LOVENOX) injection  40 mg Subcutaneous Q24H  . fluticasone furoate-vilanterol  1 puff Inhalation Daily  . lisinopril  10 mg Oral Daily   And  . hydrochlorothiazide  12.5 mg Oral Daily  . insulin aspart  0-15 Units Subcutaneous TID WC  . insulin aspart  0-5 Units Subcutaneous QHS  . insulin aspart  3 Units Subcutaneous TID WC  . ipratropium-albuterol  3 mL Nebulization Q6H  . methylPREDNISolone (SOLU-MEDROL) injection  60 mg Intravenous Q6H  . multivitamin with minerals  1 tablet Oral Daily  . nicotine  21 mg Transdermal Daily  . pantoprazole  40 mg Oral BID  . PARoxetine  20 mg Oral Daily   Continuous Infusions: . cefTRIAXone (ROCEPHIN)  IV 1 g (05/25/20 0847)     LOS: 1 day   Time spent: 32 mins   Donne Robillard Wynetta Emery, MD How to contact the [facility] Attending or Consulting provider [place] or covering provider during after hours [place], for this patient?  1. Check the care team in [facility] and look for a) attending/consulting TR[facility] provider listed and b) the [facility] team listed 2. Log into [email or web address] and use [facility]'s universal password to access. If you do not have the password, please contact the hospital operator. 3. Locate the [facility] provider you are looking for under [facility] and page to a number that you can be directly reached. 4. If you still have difficulty reaching the provider, please page the [facility] (Director on Call) for the Hospitalists listed on amion for assistance.  05/25/2020, 1:16 PM

## 2020-05-26 LAB — GLUCOSE, CAPILLARY
Glucose-Capillary: 123 mg/dL — ABNORMAL HIGH (ref 70–99)
Glucose-Capillary: 117 mg/dL — ABNORMAL HIGH (ref 70–99)
Glucose-Capillary: 150 mg/dL — ABNORMAL HIGH (ref 70–99)
Glucose-Capillary: 138 mg/dL — ABNORMAL HIGH (ref 70–99)
Glucose-Capillary: 150 mg/dL — ABNORMAL HIGH (ref 70–99)

## 2020-05-26 LAB — COMPREHENSIVE METABOLIC PANEL
ALT: 40 U/L (ref 0–44)
AST: 36 U/L (ref 15–41)
Albumin: 3.7 g/dL (ref 3.5–5.0)
Alkaline Phosphatase: 118 U/L (ref 38–126)
Anion gap: 10 (ref 5–15)
BUN: 9 mg/dL (ref 8–23)
CO2: 31 mmol/L (ref 22–32)
Calcium: 9.3 mg/dL (ref 8.9–10.3)
Chloride: 94 mmol/L — ABNORMAL LOW (ref 98–111)
Creatinine, Ser: 0.44 mg/dL (ref 0.44–1.00)
GFR calc Af Amer: >60 mL/min (ref >60)
GFR calc non Af Amer: >60 mL/min (ref >60)
Glucose, Bld: 122 mg/dL — ABNORMAL HIGH (ref 70–99)
Potassium: 3.9 mmol/L (ref 3.5–5.1)
Sodium: 135 mmol/L (ref 135–145)
Total Bilirubin: 0.4 mg/dL (ref 0.3–1.2)
Total Protein: 6.9 g/dL (ref 6.5–8.1)

## 2020-05-26 LAB — MAGNESIUM: Magnesium: 1.9 mg/dL (ref 1.7–2.4)

## 2020-05-26 MED ORDER — SENNOSIDES-DOCUSATE SODIUM 8.6-50 MG PO TABS: 2.0000 | ORAL_TABLET | Freq: Every evening | ORAL | Status: DC | PRN

## 2020-05-26 MED ORDER — POLYETHYLENE GLYCOL 3350 17 G PO PACK: 17.0000 g | PACK | Freq: Every day | ORAL | Status: DC | PRN

## 2020-05-26 MED ORDER — FENTANYL CITRATE (PF) 100 MCG/2ML IJ SOLN: 12.5000 ug | Freq: Once | INTRAMUSCULAR | Status: DC

## 2020-05-26 MED ORDER — METHYLPREDNISOLONE SODIUM SUCC 40 MG IJ SOLR: 40.0000 mg | Freq: Three times a day (TID) | INTRAMUSCULAR | Status: DC

## 2020-05-26 MED ORDER — METHYLPREDNISOLONE SODIUM SUCC 40 MG IJ SOLR
40.0000 mg | Freq: Three times a day (TID) | INTRAMUSCULAR | Status: DC
  Administered 2020-05-26 – 2020-05-27 (×3): 40 mg via INTRAVENOUS
  Filled 2020-05-26 (×3): qty 1

## 2020-05-26 NOTE — Plan of Care (Signed)

## 2020-05-26 NOTE — Progress Notes (Signed)
PT Cancellation Note  Patient Details Name: FENIX RUPPE MRN: [id number] DOB: 01/31/1946   Cancelled Treatment:    Reason Eval/Treat Not Completed: Other (comment) (pt functioning at baseline). Per pt, she is able to get up and move around the room without assistance. RN reports pt has been ambulating around the floor without unsteadiness. Pt without concerns in mobility; therapy will sign off at this time. Please reconsult if needs arise.    Talbot Grumbling PT, DPT 05/26/20, 2:38 PM [phone]

## 2020-05-26 NOTE — Progress Notes (Signed)
PROGRESS NOTE    [name]  HYI:[id number] DOB: [birth year] DOA: 05/24/2020 PCP: Chevis Pretty, FNP   Brief Narrative:  74 y.o.femalewith medical history significantfor COPD on home oxygen, tobacco usesmokes 1 ppd cigs, GERD, HTN, depression and anxiety presented by EMS with shortness of breath. Patient diagnosed with COPD exacerbation/community-acquired pneumonia and admitted to the hospital.  Started on routine treatments including IV steroids, bronchodilators and antibiotics.   Assessment & Plan:   Principal Problem:   Acute on chronic respiratory failure with hypoxia (HCC) Active Problems:   Chronic diarrhea   GERD (gastroesophageal reflux disease)   COPD (chronic obstructive pulmonary disease) with chronic bronchitis (HCC)   Anxiety   Hypokalemia   Benign hypertension   Chronic lower back pain   Hyperlipidemia with target LDL less than 100   Smoker   Aortic atherosclerosis (HCC)   DNR (do not resuscitate)   IBS (irritable bowel syndrome)   Elevated LFTs   Hyperglycemia  Acute on chronic hypoxic respiratory failure, 3 L nasal cannula Acute COPD exacerbation with community-acquired pneumonia Bilateral wheezing -This morning even with ambulation she desaturates down to 80%.  Still has bilateral expiratory wheezing -Continue bronchodilators, IV steroids, I-S/flutter -Supportive care, supplemental oxygen  GERD -PPI  Daily tobacco use -Counseled to quit using.  She is interested in getting nicotine patch upon discharge  Chronic low back pain -Resume home meds  Essential hypertension -Home meds-Norvasc, hydrochlorothiazide     DVT prophylaxis: Lovenox Code Status: DNR Family Communication: Offered to call but patient states she will notify her home family  Status is: Inpatient   Dispo:  Patient From: Home  Planned Disposition: Home  Expected discharge date: 1-2 days  Medically stable for discharge: No.  Patient is still having  bilateral diffuse expiratory wheezing, desaturates down to 80% with ambulation.  Not safe for discharge at   Body mass index is 31.78 kg/m.      Subjective: Symptomatically patient states she feels slightly better but with ambulation she felt short of breath and became hypoxic at 80%.  Still having coughing and bilateral wheezing.  Review of Systems Otherwise negative except as per HPI, including: General: Denies fever, chills, night sweats or unintended weight loss. Resp: Denies hemoptysis Cardiac: Denies chest pain, palpitations, orthopnea, paroxysmal nocturnal dyspnea. GI: Denies abdominal pain, nausea, vomiting, diarrhea or constipation GU: Denies dysuria, frequency, hesitancy or incontinence MS: Denies muscle aches, joint pain or swelling Neuro: Denies headache, neurologic deficits (focal weakness, numbness, tingling), abnormal gait Psych: Denies anxiety, depression, SI/HI/AVH Skin: Denies new rashes or lesions ID: Denies sick contacts, exotic exposures, travel  Examination:  General exam: Appears calm and comfortable, 2 L nasal cannula Respiratory system: Diffuse bilateral expiratory wheezing Cardiovascular system: S1 & S2 heard, RRR. No JVD, murmurs, rubs, gallops or clicks. No pedal edema. Gastrointestinal system: Abdomen is nondistended, soft and nontender. No organomegaly or masses felt. Normal bowel sounds heard. Central nervous system: Alert and oriented. No focal neurological deficits. Extremities: Symmetric 5 x 5 power. Skin: No rashes, lesions or ulcers Psychiatry: Judgement and insight appear normal. Mood & affect appropriate.     Objective: Vitals:   05/25/20 2056 05/26/20 0210 05/26/20 0515 05/26/20 0907  BP:   (!) 148/65   Pulse:   76   Resp:   20   Temp:   98.4 F (36.9 C)   TempSrc:   Oral   SpO2: 91% 94% 93% 94%  Weight:      Height:  Intake/Output Summary (Last 24 hours) at 05/26/2020 1226 Last data filed at 05/25/2020 1500 Gross per 24  hour  Intake 100 ml  Output --  Net 100 ml   Filed Weights   05/24/20 0734 05/24/20 1625  Weight: 83.9 kg 86.6 kg     Data Reviewed:   CBC: Recent Labs  Lab 05/24/20 0810  WBC 6.9  NEUTROABS 5.0  HGB 13.3  HCT 41.7  MCV 90.5  PLT 270   Basic Metabolic Panel: Recent Labs  Lab 05/24/20 0810 05/25/20 0554 05/26/20 0730  NA 135 133* 135  K 3.2* 3.6 3.9  CL 95* 96* 94*  CO2 30 29 31   GLUCOSE 109* 116* 122*  BUN 5* 8 9  CREATININE 0.45 0.39* 0.44  CALCIUM 9.0 9.0 9.3  MG  --  1.9 1.9   GFR: Estimated Creatinine Clearance: 69 mL/min (by C-G formula based on SCr of 0.44 mg/dL). Liver Function Tests: Recent Labs  Lab 05/24/20 0810 05/25/20 0554 05/26/20 0730  AST 41 23 36  ALT 56* 42 40  ALKPHOS 151* 121 118  BILITOT 0.4 0.3 0.4  PROT 6.4* 6.0* 6.9  ALBUMIN 3.5 3.3* 3.7   No results for input(s): LIPASE, AMYLASE in the last 168 hours. No results for input(s): AMMONIA in the last 168 hours. Coagulation Profile: No results for input(s): INR, PROTIME in the last 168 hours. Cardiac Enzymes: No results for input(s): CKTOTAL, CKMB, CKMBINDEX, TROPONINI in the last 168 hours. BNP (last 3 results) No results for input(s): PROBNP in the last 8760 hours. HbA1C: Recent Labs    05/24/20 0810  HGBA1C 5.7*   CBG: Recent Labs  Lab 05/25/20 1211 05/25/20 2011 05/25/20 2224 05/26/20 0745 05/26/20 1216  GLUCAP 110* 123* 125* 117* 150*   Lipid Profile: No results for input(s): CHOL, HDL, LDLCALC, TRIG, CHOLHDL, LDLDIRECT in the last 72 hours. Thyroid Function Tests: No results for input(s): TSH, T4TOTAL, FREET4, T3FREE, THYROIDAB in the last 72 hours. Anemia Panel: No results for input(s): VITAMINB12, FOLATE, FERRITIN, TIBC, IRON, RETICCTPCT in the last 72 hours. Sepsis Labs: No results for input(s): PROCALCITON, LATICACIDVEN in the last 168 hours.  Recent Results (from the past 240 hour(s))  SARS Coronavirus 2 by RT PCR (hospital order, performed in [facility] hospital lab) Nasopharyngeal Nasopharyngeal Swab     Status: None   Collection Time: 05/24/20  7:54 AM   Specimen: Nasopharyngeal Swab  Result Value Ref Range Status   SARS Coronavirus 2 NEGATIVE NEGATIVE Final    Comment: (NOTE) SARS-CoV-2 target nucleic acids are NOT DETECTED.  The SARS-CoV-2 RNA is generally detectable in upper and lower respiratory specimens during the acute phase of infection. The lowest concentration of SARS-CoV-2 viral copies this assay can detect is 250 copies / mL. A negative result does not preclude SARS-CoV-2 infection and should not be used as the sole basis for treatment or other patient management decisions.  A negative result may occur with improper specimen collection / handling, submission of specimen other than nasopharyngeal swab, presence of viral mutation(s) within the areas targeted by this assay, and inadequate number of viral copies (<250 copies / mL). A negative result must be combined with clinical observations, patient history, and epidemiological information.  Fact Sheet for Patients:   StrictlyIdeas.no  Fact Sheet for Healthcare Providers: BankingDealers.co.za  This test is not yet approved or  cleared by the [place] FDA and has been authorized for detection and/or diagnosis of SARS-CoV-2 by FDA under an Emergency Use Authorization (EUA).  This EUA will remain in effect (meaning this test can be used) for the duration of the COVID-19 declaration under Section 564(b)(1) of the Act, 21 U.S.C. section 360bbb-3(b)(1), unless the authorization is terminated or revoked sooner.  Performed at [facility], [address], [place],  [address]          Radiology Studies: DG Chest 2 View  Result Date: 05/25/2020 CLINICAL DATA:  Shortness of breath.  Hypertension and COPD. EXAM: CHEST - 2 VIEW COMPARISON:  05/24/2020 FINDINGS: Stable cardiac enlargement. Increase  interstitial markings are identified bilaterally compatible with pulmonary vascular congestion. No focal airspace consolidation noted. No pleural effusion. Visualized osseous structures are unremarkable. Remote left posterolateral rib deformity. IMPRESSION: Cardiac enlargement and pulmonary vascular congestion. Electronically Signed   By: Kerby Moors M.D.   On: 05/25/2020 10:30        Scheduled Meds: . albuterol  5 mg Nebulization Once  . amLODipine  5 mg Oral Daily  . vitamin C  1,000 mg Oral Daily  . azithromycin  500 mg Oral Daily  . calcium carbonate  1 tablet Oral Q breakfast  . enoxaparin (LOVENOX) injection  40 mg Subcutaneous Q24H  . fluticasone furoate-vilanterol  1 puff Inhalation Daily  . lisinopril  10 mg Oral Daily   And  . hydrochlorothiazide  12.5 mg Oral Daily  . insulin aspart  0-15 Units Subcutaneous TID WC  . insulin aspart  0-5 Units Subcutaneous QHS  . insulin aspart  3 Units Subcutaneous TID WC  . ipratropium-albuterol  3 mL Nebulization Q6H  . methylPREDNISolone (SOLU-MEDROL) injection  40 mg Intravenous Q8H  . multivitamin with minerals  1 tablet Oral Daily  . nicotine  21 mg Transdermal Daily  . pantoprazole  40 mg Oral BID  . PARoxetine  20 mg Oral Daily   Continuous Infusions: . cefTRIAXone (ROCEPHIN)  IV 1 g (05/26/20 0839)     LOS: 2 days   Time spent= 35 mins    Rajveer Handler Arsenio Loader, MD [facility]  If 7PM-7AM, please contact night-coverage  05/26/2020, 12:26 PM

## 2020-05-26 NOTE — Progress Notes (Signed)
OT Screen Note  Patient Details Name: [name] MRN: [id number] DOB: 06-May-1946   Cancelled Treatment:    Reason Eval/Treat Not Completed: OT screened, no needs identified, will sign off    Ailene Ravel, OTR/L,CBIS  [phone]  05/26/2020, 1:44 PM

## 2020-05-27 DIAGNOSIS — J441 Chronic obstructive pulmonary disease with (acute) exacerbation: Secondary | ICD-10-CM

## 2020-05-27 DIAGNOSIS — J189 Pneumonia, unspecified organism: Principal | ICD-10-CM

## 2020-05-27 LAB — COMPREHENSIVE METABOLIC PANEL
ALT: 29 U/L (ref 0–44)
AST: 24 U/L (ref 15–41)
Albumin: 3.2 g/dL — ABNORMAL LOW (ref 3.5–5.0)
Alkaline Phosphatase: 88 U/L (ref 38–126)
Anion gap: 9 (ref 5–15)
BUN: 12 mg/dL (ref 8–23)
CO2: 31 mmol/L (ref 22–32)
Calcium: 9 mg/dL (ref 8.9–10.3)
Chloride: 93 mmol/L — ABNORMAL LOW (ref 98–111)
Creatinine, Ser: 0.43 mg/dL — ABNORMAL LOW (ref 0.44–1.00)
GFR calc Af Amer: >60 mL/min (ref >60)
GFR calc non Af Amer: >60 mL/min (ref >60)
Glucose, Bld: 120 mg/dL — ABNORMAL HIGH (ref 70–99)
Potassium: 3.7 mmol/L (ref 3.5–5.1)
Sodium: 133 mmol/L — ABNORMAL LOW (ref 135–145)
Total Bilirubin: 0.3 mg/dL (ref 0.3–1.2)
Total Protein: 6.1 g/dL — ABNORMAL LOW (ref 6.5–8.1)

## 2020-05-27 LAB — CBC
HCT: 41.5 % (ref 36.0–46.0)
Hemoglobin: 13.2 g/dL (ref 12.0–15.0)
MCH: 28.8 pg (ref 26.0–34.0)
MCHC: 31.8 g/dL (ref 30.0–36.0)
MCV: 90.6 fL (ref 80.0–100.0)
Platelets: 202 10*3/uL (ref 150–400)
RBC: 4.58 MIL/uL (ref 3.87–5.11)
RDW: 13.8 % (ref 11.5–15.5)
WBC: 9.2 10*3/uL (ref 4.0–10.5)
nRBC: 0 % (ref 0.0–0.2)

## 2020-05-27 LAB — GLUCOSE, CAPILLARY
Glucose-Capillary: 134 mg/dL — ABNORMAL HIGH (ref 70–99)
Glucose-Capillary: 119 mg/dL — ABNORMAL HIGH (ref 70–99)
Glucose-Capillary: 120 mg/dL — ABNORMAL HIGH (ref 70–99)

## 2020-05-27 LAB — MAGNESIUM: Magnesium: 2.1 mg/dL (ref 1.7–2.4)

## 2020-05-27 MED ORDER — GUAIFENESIN-DM 100-10 MG/5ML PO SYRP: 10.0000 mL | ORAL_SOLUTION | ORAL | 0 refills | Status: DC | PRN

## 2020-05-27 MED ORDER — CEFUROXIME AXETIL 500 MG PO TABS: 500.0000 mg | ORAL_TABLET | Freq: Two times a day (BID) | ORAL | 0 refills | Status: AC

## 2020-05-27 MED ORDER — PREDNISONE 20 MG PO TABS: 40.0000 mg | ORAL_TABLET | Freq: Every day | ORAL | 0 refills | Status: AC

## 2020-05-27 NOTE — Discharge Summary (Signed)
Physician Discharge Summary  [name] BHA:[id number] DOB: Mar 20, 1946 DOA: 05/24/2020  PCP: Chevis Pretty, FNP  Admit date: 05/24/2020 Discharge date: 05/27/2020  Admitted From: Home Disposition: Home  Recommendations for Outpatient Follow-up:  1. Follow up with PCP in 1 week with repeat CBC/BMP 2. Patient needs to stop smoking 3. Follow up in ED if symptoms worsen or new appear   Home Health: No Equipment/Devices: Continue supplemental oxygen at home via nasal cannula as needed  Discharge Condition: Guarded  CODE STATUS: Full Diet recommendation: Heart healthy  Brief/Interim Summary: 74 y.o.femalewith medical history significantfor COPD on home oxygen as needed, tobacco usesmokes 1 ppd cigs, GERD, HTN, depression and anxiety presented by EMS with shortness of breath.  She was admitted with COPD exacerbation and community-acquired pneumonia and started on IV antibiotics and steroids.  During the hospitalization, her condition gradually improved.  Her breathing has improved.  She wants to go home.  Her saturations are 87 to 89% on room air on ambulation; patient needs to continue home oxygen via nasal cannula as needed.  She needs to stop smoking.  She will be discharged home today on oral antibiotics and steroids.  Discharge Diagnoses:   Acute on chronic hypoxic respiratory failure COPD exacerbation Community-acquired pneumonia Ongoing tobacco abuse -Patient was admitted for COPD exacerbation and pneumonia.  Required up to 3 L oxygen via nasal cannula during the hospitalization.  Respiratory status is improving.  Afebrile. - She wants to go home.  Her saturations are 87 to 89% on room air on ambulation; patient needs to continue home oxygen via nasal cannula as needed.  She needs to stop smoking.   -Currently on Rocephin and Zithromax and IV Solu-Medrol.  Discharged home on oral Ceftin for 2 more days and prednisone 40 mg daily for 7 days.  Continue other home  regimen for COPD.  Outpatient follow-up with PCP  GERD -Continue PPI  Chronic low back pain -Continue home regimen.  Outpatient follow-up with PCP  Essential hypertension -Continue home regimen   Discharge Instructions  Discharge Instructions    Diet - low sodium heart healthy   Complete by: As directed    Increase activity slowly   Complete by: As directed      Allergies as of 05/27/2020      Reactions   Meloxicam Other (See Comments)   Stomach ulcers      Medication List    TAKE these medications   albuterol 108 (90 Base) MCG/ACT inhaler Commonly known as: ProAir HFA Inhale 2 puffs by mouth every 6 hours as needed for wheezing or shortness of breath   ALPRAZolam 0.5 MG tablet Commonly known as: XANAX Take 1 tablet (0.5 mg total) by mouth 3 (three) times daily as needed for anxiety.   AZO DINE PO Take 1-2 tablets by mouth.   Breo Ellipta 200-25 MCG/INH Aepb Generic drug: fluticasone furoate-vilanterol Inhale 1 puff into the lungs daily.   calcium carbonate 600 MG Tabs tablet Commonly known as: OS-CAL Take 600 mg by mouth daily.   cefUROXime 500 MG tablet Commonly known as: CEFTIN Take 1 tablet (500 mg total) by mouth 2 (two) times daily with a meal for 2 days.   dicyclomine 10 MG capsule Commonly known as: BENTYL Take 1 capsule (10 mg total) by mouth 4 (four) times daily as needed for spasms.   diphenhydrAMINE 25 MG tablet Commonly known as: BENADRYL Take 25 mg by mouth 2 (two) times daily as needed. For Allergies   fluticasone 50 MCG/ACT  nasal spray Commonly known as: FLONASE SPRAY 2 SPRAYS INTO EACH NOSTRIL EVERY DAY   Goodys Extra Strength 500-325-65 MG Pack Generic drug: Aspirin-Acetaminophen-Caffeine Take 1 packet by mouth every 8 (eight) hours as needed (for pain.).   guaiFENesin-dextromethorphan 100-10 MG/5ML syrup Commonly known as: ROBITUSSIN DM Take 10 mLs by mouth every 4 (four) hours as needed for cough.   HYDROcodone-acetaminophen  10-325 MG tablet Commonly known as: NORCO Take 1 tablet by mouth every 8 (eight) hours as needed. Start taking on: June 10, 2020 What changed: Another medication with the same name was removed. Continue taking this medication, and follow the directions you see here.   ipratropium 0.02 % nebulizer solution Commonly known as: ATROVENT Take 2.5 mLs (0.5 mg total) by nebulization every 6 (six) hours as needed for wheezing or shortness of breath.   levalbuterol 1.25 MG/0.5ML nebulizer solution Commonly known as: XOPENEX Take 1.25 mg by nebulization every 6 (six) hours as needed for wheezing or shortness of breath.   lisinopril-hydrochlorothiazide 10-12.5 MG tablet Commonly known as: ZESTORETIC Take 1 tablet by mouth daily.   multivitamin with minerals Tabs tablet Take 1 tablet by mouth daily.   pantoprazole 40 MG tablet Commonly known as: PROTONIX Take 1 tablet (40 mg total) by mouth 2 (two) times daily.   PARoxetine 20 MG tablet Commonly known as: PAXIL Take 1 tablet (20 mg total) by mouth daily.   predniSONE 20 MG tablet Commonly known as: Deltasone Take 2 tablets (40 mg total) by mouth daily with breakfast for 7 days.   vitamin C 500 MG tablet Commonly known as: ASCORBIC ACID Take 1,000 mg by mouth daily.   Vitamin D3 25 MCG (1000 UT) Caps Take 3,000 mg by mouth daily.       Follow-up Information    Chevis Pretty, FNP. Schedule an appointment as soon as possible for a visit in 1 week(s).   Specialty: Family Medicine Contact information: [address] [place] [address] [phone]              Allergies  Allergen Reactions  . Meloxicam Other (See Comments)    Stomach ulcers    Consultations:  None   Procedures/Studies: DG Chest 2 View  Result Date: 05/25/2020 CLINICAL DATA:  Shortness of breath.  Hypertension and COPD. EXAM: CHEST - 2 VIEW COMPARISON:  05/24/2020 FINDINGS: Stable cardiac enlargement. Increase interstitial  markings are identified bilaterally compatible with pulmonary vascular congestion. No focal airspace consolidation noted. No pleural effusion. Visualized osseous structures are unremarkable. Remote left posterolateral rib deformity. IMPRESSION: Cardiac enlargement and pulmonary vascular congestion. Electronically Signed   By: Kerby Moors M.D.   On: 05/25/2020 10:30   DG Chest Port 1 View  Result Date: 05/24/2020 CLINICAL DATA:  Cough and shortness of breath. EXAM: PORTABLE CHEST 1 VIEW COMPARISON:  April 07, 2020 FINDINGS: Stable cardiomegaly. The hila and mediastinum are unchanged. No pneumothorax. Increased interstitial markings. Mildly more focal opacity in the right base. No other acute abnormalities. IMPRESSION: 1. Increased interstitial markings bilaterally suggestive of bronchitic change or atypical infection. More focal infiltrate appears to be developing in the right base, possibly developing focal pneumonia. Recommend short-term follow-up imaging to ensure resolution. Electronically Signed   By: Dorise Bullion III M.D   On: 05/24/2020 08:26       Subjective: Patient seen and examined at bedside.  She feels that her breathing has improved and wants to go home today.  No overnight fever, vomiting, chest pain or worsening shortness of  breath reported.  Discharge Exam: Vitals:   05/27/20 0253 05/27/20 0529  BP:  (!) 151/79  Pulse:  71  Resp:  20  Temp:  98.8 F (37.1 C)  SpO2: 97% 97%    General: Pt is alert, awake, not in acute distress.  Looks chronically ill.  Poor historian. Cardiovascular: rate controlled, S1/S2 + Respiratory: bilateral decreased breath sounds at bases with some scattered crackles, very minimal wheezing Abdominal: Soft, NT, ND, bowel sounds + Extremities: Trace lower extremity edema; no cyanosis    The results of significant diagnostics from this hospitalization (including imaging, microbiology, ancillary and laboratory) are listed below for reference.      Microbiology: Recent Results (from the past 240 hour(s))  SARS Coronavirus 2 by RT PCR (hospital order, performed in [facility] hospital lab) Nasopharyngeal Nasopharyngeal Swab     Status: None   Collection Time: 05/24/20  7:54 AM   Specimen: Nasopharyngeal Swab  Result Value Ref Range Status   SARS Coronavirus 2 NEGATIVE NEGATIVE Final    Comment: (NOTE) SARS-CoV-2 target nucleic acids are NOT DETECTED.  The SARS-CoV-2 RNA is generally detectable in upper and lower respiratory specimens during the acute phase of infection. The lowest concentration of SARS-CoV-2 viral copies this assay can detect is 250 copies / mL. A negative result does not preclude SARS-CoV-2 infection and should not be used as the sole basis for treatment or other patient management decisions.  A negative result may occur with improper specimen collection / handling, submission of specimen other than nasopharyngeal swab, presence of viral mutation(s) within the areas targeted by this assay, and inadequate number of viral copies (<250 copies / mL). A negative result must be combined with clinical observations, patient history, and epidemiological information.  Fact Sheet for Patients:   StrictlyIdeas.no  Fact Sheet for Healthcare Providers: BankingDealers.co.za  This test is not yet approved or  cleared by the [place] FDA and has been authorized for detection and/or diagnosis of SARS-CoV-2 by FDA under an Emergency Use Authorization (EUA).  This EUA will remain in effect (meaning this test can be used) for the duration of the COVID-19 declaration under Section 564(b)(1) of the Act, 21 U.S.C. section 360bbb-3(b)(1), unless the authorization is terminated or revoked sooner.  Performed at [facility], [address], [place], [place] [address]      Labs: BNP (last 3 results) Recent Labs    05/25/20 0554  BNP 10.2   Basic Metabolic Panel: Recent  Labs  Lab 05/24/20 0810 05/25/20 0554 05/26/20 0730 05/27/20 0632  NA 135 133* 135 133*  K 3.2* 3.6 3.9 3.7  CL 95* 96* 94* 93*  CO2 30 29 31 31   GLUCOSE 109* 116* 122* 120*  BUN 5* 8 9 12   CREATININE 0.45 0.39* 0.44 0.43*  CALCIUM 9.0 9.0 9.3 9.0  MG  --  1.9 1.9 2.1   Liver Function Tests: Recent Labs  Lab 05/24/20 0810 05/25/20 0554 05/26/20 0730 05/27/20 0632  AST 41 23 36 24  ALT 56* 42 40 29  ALKPHOS 151* 121 118 88  BILITOT 0.4 0.3 0.4 0.3  PROT 6.4* 6.0* 6.9 6.1*  ALBUMIN 3.5 3.3* 3.7 3.2*   No results for input(s): LIPASE, AMYLASE in the last 168 hours. No results for input(s): AMMONIA in the last 168 hours. CBC: Recent Labs  Lab 05/24/20 0810 05/27/20 0632  WBC 6.9 9.2  NEUTROABS 5.0  --   HGB 13.3 13.2  HCT 41.7 41.5  MCV 90.5 90.6  PLT  175 202   Cardiac Enzymes: No results for input(s): CKTOTAL, CKMB, CKMBINDEX, TROPONINI in the last 168 hours. BNP: Invalid input(s): POCBNP CBG: Recent Labs  Lab 05/26/20 1216 05/26/20 1654 05/26/20 2146 05/27/20 0332 05/27/20 0737  GLUCAP 150* 138* 150* 134* 119*   D-Dimer No results for input(s): DDIMER in the last 72 hours. Hgb A1c No results for input(s): HGBA1C in the last 72 hours. Lipid Profile No results for input(s): CHOL, HDL, LDLCALC, TRIG, CHOLHDL, LDLDIRECT in the last 72 hours. Thyroid function studies No results for input(s): TSH, T4TOTAL, T3FREE, THYROIDAB in the last 72 hours.  Invalid input(s): FREET3 Anemia work up No results for input(s): VITAMINB12, FOLATE, FERRITIN, TIBC, IRON, RETICCTPCT in the last 72 hours. Urinalysis    Component Value Date/Time   COLORURINE YELLOW 11/26/2012 1211   APPEARANCEUR Clear 04/16/2019 1429   LABSPEC <1.005 (L) 11/26/2012 1211   PHURINE 5.5 11/26/2012 1211   GLUCOSEU Negative 04/16/2019 1429   HGBUR NEGATIVE 11/26/2012 1211   BILIRUBINUR Negative 04/16/2019 1429   KETONESUR NEGATIVE 11/26/2012 1211   PROTEINUR Negative 04/16/2019 1429    PROTEINUR NEGATIVE 11/26/2012 1211   UROBILINOGEN negative 08/09/2013 1050   UROBILINOGEN 0.2 11/26/2012 1211   NITRITE Positive (A) 04/16/2019 1429   NITRITE POSITIVE (A) 11/26/2012 1211   LEUKOCYTESUR Trace (A) 04/16/2019 1429   Sepsis Labs Invalid input(s): PROCALCITONIN,  WBC,  LACTICIDVEN Microbiology Recent Results (from the past 240 hour(s))  SARS Coronavirus 2 by RT PCR (hospital order, performed in [facility] hospital lab) Nasopharyngeal Nasopharyngeal Swab     Status: None   Collection Time: 05/24/20  7:54 AM   Specimen: Nasopharyngeal Swab  Result Value Ref Range Status   SARS Coronavirus 2 NEGATIVE NEGATIVE Final    Comment: (NOTE) SARS-CoV-2 target nucleic acids are NOT DETECTED.  The SARS-CoV-2 RNA is generally detectable in upper and lower respiratory specimens during the acute phase of infection. The lowest concentration of SARS-CoV-2 viral copies this assay can detect is 250 copies / mL. A negative result does not preclude SARS-CoV-2 infection and should not be used as the sole basis for treatment or other patient management decisions.  A negative result may occur with improper specimen collection / handling, submission of specimen other than nasopharyngeal swab, presence of viral mutation(s) within the areas targeted by this assay, and inadequate number of viral copies (<250 copies / mL). A negative result must be combined with clinical observations, patient history, and epidemiological information.  Fact Sheet for Patients:   StrictlyIdeas.no  Fact Sheet for Healthcare Providers: BankingDealers.co.za  This test is not yet approved or  cleared by the [place] FDA and has been authorized for detection and/or diagnosis of SARS-CoV-2 by FDA under an Emergency Use Authorization (EUA).  This EUA will remain in effect (meaning this test can be used) for the duration of the COVID-19 declaration under Section  564(b)(1) of the Act, 21 U.S.C. section 360bbb-3(b)(1), unless the authorization is terminated or revoked sooner.  Performed at [facility], [address], [place], [place] [address]      Time coordinating discharge: 35 minutes  SIGNED:   Aline August, MD  [facility] 05/27/2020, 10:55 AM

## 2020-05-27 NOTE — Progress Notes (Signed)
O2 sat% 87-89% on room air with ambulation.

## 2020-05-27 NOTE — Progress Notes (Signed)
NURSING PROGRESS NOTE  [name] [id number] Discharge Data: 05/27/2020 1:39 PM Attending Provider: Aline August, MD [id number], Mary-Margaret, FNP     [name] to be D/C'd Home per MD order.  Discussed with the patient the After Visit Summary and all questions fully answered. All IV's discontinued with no bleeding noted. All belongings returned to patient for patient to take home.   Last Vital Signs:  Blood pressure (!) 151/79, pulse 71, temperature 98.8 F (37.1 C), resp. rate 20, height 5\' 5"  (1.651 m), weight 87.2 kg, SpO2 96 %.  Discharge Medication List Allergies as of 05/27/2020      Reactions   Meloxicam Other (See Comments)   Stomach ulcers      Medication List    TAKE these medications   albuterol 108 (90 Base) MCG/ACT inhaler Commonly known as: ProAir HFA Inhale 2 puffs by mouth every 6 hours as needed for wheezing or shortness of breath   ALPRAZolam 0.5 MG tablet Commonly known as: XANAX Take 1 tablet (0.5 mg total) by mouth 3 (three) times daily as needed for anxiety.   AZO DINE PO Take 1-2 tablets by mouth.   Breo Ellipta 200-25 MCG/INH Aepb Generic drug: fluticasone furoate-vilanterol Inhale 1 puff into the lungs daily.   calcium carbonate 600 MG Tabs tablet Commonly known as: OS-CAL Take 600 mg by mouth daily.   cefUROXime 500 MG tablet Commonly known as: CEFTIN Take 1 tablet (500 mg total) by mouth 2 (two) times daily with a meal for 2 days.   dicyclomine 10 MG capsule Commonly known as: BENTYL Take 1 capsule (10 mg total) by mouth 4 (four) times daily as needed for spasms.   diphenhydrAMINE 25 MG tablet Commonly known as: BENADRYL Take 25 mg by mouth 2 (two) times daily as needed. For Allergies   fluticasone 50 MCG/ACT nasal spray Commonly known as: FLONASE SPRAY 2 SPRAYS INTO EACH NOSTRIL EVERY DAY   [employer]ys Extra Strength 500-325-65 MG Pack Generic drug: Aspirin-Acetaminophen-Caffeine Take 1 packet by mouth every 8  (eight) hours as needed (for pain.).   guaiFENesin-dextromethorphan 100-10 MG/5ML syrup Commonly known as: ROBITUSSIN DM Take 10 mLs by mouth every 4 (four) hours as needed for cough.   HYDROcodone-acetaminophen 10-325 MG tablet Commonly known as: NORCO Take 1 tablet by mouth every 8 (eight) hours as needed. Start taking on: June 10, 2020 What changed: Another medication with the same name was removed. Continue taking this medication, and follow the directions you see here.   ipratropium 0.02 % nebulizer solution Commonly known as: ATROVENT Take 2.5 mLs (0.5 mg total) by nebulization every 6 (six) hours as needed for wheezing or shortness of breath.   levalbuterol 1.25 MG/0.5ML nebulizer solution Commonly known as: XOPENEX Take 1.25 mg by nebulization every 6 (six) hours as needed for wheezing or shortness of breath.   lisinopril-hydrochlorothiazide 10-12.5 MG tablet Commonly known as: ZESTORETIC Take 1 tablet by mouth daily.   multivitamin with minerals Tabs tablet Take 1 tablet by mouth daily.   pantoprazole 40 MG tablet Commonly known as: PROTONIX Take 1 tablet (40 mg total) by mouth 2 (two) times daily.   PARoxetine 20 MG tablet Commonly known as: PAXIL Take 1 tablet (20 mg total) by mouth daily.   predniSONE 20 MG tablet Commonly known as: Deltasone Take 2 tablets (40 mg total) by mouth daily with breakfast for 7 days.   vitamin C 500 MG tablet Commonly known as: ASCORBIC ACID Take 1,000 mg by mouth daily.  Vitamin D3 25 MCG (1000 UT) Caps Take 3,000 mg by mouth daily.        Doristine Devoid, RN

## 2020-05-27 NOTE — Care Management Important Message (Signed)
Important Message  Patient Details  Name: [name] MRN: [id number] Date of Birth: Jun 21, 1946   Medicare Important Message Given:  Yes     Tommy Medal 05/27/2020, 12:18 PM

## 2020-05-28 ENCOUNTER — Telehealth: Payer: Self-pay

## 2020-05-28 NOTE — Telephone Encounter (Signed)
TRANSITIONAL CARE MANAGEMENT TELEPHONE OUTREACH NOTE   Contact Date: 05/28/2020 Contacted By:  Felicity Coyer   DISCHARGE INFORMATION Date of Discharge:  05/27/2020 Discharge Facility: [facility] Discharge Diagnosis:  Acute on chronic hypoxic respiratory failure COPD exacerbation Community-acquired pneumonia Ongoing tobacco abuse  Outpatient Follow Up Recommendations: 1. Follow up with PCP in 1 week with repeat CBC/BMP 2. Patient needs to stop smoking 3. Follow up in ED if symptoms worsen or new appear   [name] is a female primary care patient of Chevis Pretty, FNP. An outgoing telephone call was made today and I spoke with the patient.  [name] condition(s) and treatment(s) were discussed. An opportunity to ask questions was provided and all were answered or forwarded as appropriate.    ACTIVITIES OF DAILY LIVING  [name] lives with their spouse and she can perform ADLs independently. her primary caregiver is herself. she is able to depend on her primary caregiver(s) for consistent help. Transportation to appointments, to pick up medications, and to run errands is not a problem.  (Consider referral to [facility] if transportation or a consistent caregiver is a problem)   Fall Risk Fall Risk  04/07/2020 01/09/2020  Falls in the past year? 0 0  Number falls in past yr: - -  Injury with Fall? - -  Comment - -  Follow up - -  Comment - -    low [place] Modifications/Assistive Devices Wheelchair: No Cane: No Ramp: No Bedside Toilet: No Hospital Bed:  No Other: Patient discharged home on [place] she is not receiving home health Nursing services.     MEDICATION RECONCILIATION  [name] has been able to pick-up all prescribed discharge medications from the pharmacy.   A post discharge medication reconciliation was performed and the complete medication list was reviewed with the  patient/caregiver and is current as of 05/28/2020. Changes highlighted below.  Discontinued Medications None  Current Medication List Allergies as of 05/28/2020      Reactions   Meloxicam Other (See Comments)   Stomach ulcers      Medication List       Accurate as of May 28, 2020  9:24 AM. If you have any questions, ask your nurse or doctor.        albuterol 108 (90 Base) MCG/ACT inhaler Commonly known as: ProAir HFA Inhale 2 puffs by mouth every 6 hours as needed for wheezing or shortness of breath   ALPRAZolam 0.5 MG tablet Commonly known as: XANAX Take 1 tablet (0.5 mg total) by mouth 3 (three) times daily as needed for anxiety.   AZO DINE PO Take 1-2 tablets by mouth.   Breo Ellipta 200-25 MCG/INH Aepb Generic drug: fluticasone furoate-vilanterol Inhale 1 puff into the lungs daily.   calcium carbonate 600 MG Tabs tablet Commonly known as: OS-CAL Take 600 mg by mouth daily.   cefUROXime 500 MG tablet Commonly known as: CEFTIN Take 1 tablet (500 mg total) by mouth 2 (two) times daily with a meal for 2 days.   dicyclomine 10 MG capsule Commonly known as: BENTYL Take 1 capsule (10 mg total) by mouth 4 (four) times daily as needed for spasms.   diphenhydrAMINE 25 MG tablet Commonly known as: BENADRYL Take 25 mg by mouth 2 (two) times daily as needed. For Allergies   fluticasone 50 MCG/ACT nasal spray Commonly known as: FLONASE SPRAY 2 SPRAYS INTO EACH NOSTRIL EVERY DAY  Goodys Extra Strength [id number] MG Pack Generic drug: Aspirin-Acetaminophen-Caffeine Take 1 packet by mouth every 8 (eight) hours as needed (for pain.).   guaiFENesin-dextromethorphan 100-10 MG/5ML syrup Commonly known as: ROBITUSSIN DM Take 10 mLs by mouth every 4 (four) hours as needed for cough.   HYDROcodone-acetaminophen 10-325 MG tablet Commonly known as: NORCO Take 1 tablet by mouth every 8 (eight) hours as needed. Start taking on: June 10, 2020   ipratropium 0.02 %  nebulizer solution Commonly known as: ATROVENT Take 2.5 mLs (0.5 mg total) by nebulization every 6 (six) hours as needed for wheezing or shortness of breath.   levalbuterol 1.25 MG/0.5ML nebulizer solution Commonly known as: XOPENEX Take 1.25 mg by nebulization every 6 (six) hours as needed for wheezing or shortness of breath.   lisinopril-hydrochlorothiazide 10-12.5 MG tablet Commonly known as: ZESTORETIC Take 1 tablet by mouth daily.   multivitamin with minerals Tabs tablet Take 1 tablet by mouth daily.   pantoprazole 40 MG tablet Commonly known as: PROTONIX Take 1 tablet (40 mg total) by mouth 2 (two) times daily.   PARoxetine 20 MG tablet Commonly known as: PAXIL Take 1 tablet (20 mg total) by mouth daily.   predniSONE 20 MG tablet Commonly known as: Deltasone Take 2 tablets (40 mg total) by mouth daily with breakfast for 7 days.   vitamin C 500 MG tablet Commonly known as: ASCORBIC ACID Take 1,000 mg by mouth daily.   Vitamin D3 25 MCG (1000 UT) Caps Take 3,000 mg by mouth daily.        PATIENT EDUCATION & FOLLOW-UP PLAN  An appointment for Transitional Care Management is scheduled with Chevis Pretty, FNP on 06/04/2020 at 11:00 am.  Take all medications as prescribed  Contact our office by calling [phone] if you have any questions or concerns

## 2020-06-04 ENCOUNTER — Ambulatory Visit (INDEPENDENT_AMBULATORY_CARE_PROVIDER_SITE_OTHER): Payer: Medicare Other | Admitting: Nurse Practitioner

## 2020-06-04 ENCOUNTER — Encounter: Payer: Self-pay | Admitting: Nurse Practitioner

## 2020-06-04 ENCOUNTER — Other Ambulatory Visit: Payer: Self-pay

## 2020-06-04 VITALS — BP 153/84 | HR 89 | Temp 97.2°F | Ht 65.0 in | Wt 188.0 lb

## 2020-06-04 DIAGNOSIS — Z7689 Persons encountering health services in other specified circumstances: Secondary | ICD-10-CM | POA: Diagnosis not present

## 2020-06-04 DIAGNOSIS — J449 Chronic obstructive pulmonary disease, unspecified: Secondary | ICD-10-CM | POA: Diagnosis not present

## 2020-06-04 MED ORDER — LEVALBUTEROL HCL 1.25 MG/0.5ML IN NEBU: 1.2500 mg | INHALATION_SOLUTION | Freq: Four times a day (QID) | RESPIRATORY_TRACT | 12 refills | Status: DC | PRN

## 2020-06-04 MED ORDER — NICOTINE 21 MG/24HR TD PT24: 21.0000 mg | MEDICATED_PATCH | Freq: Every day | TRANSDERMAL | 0 refills | Status: DC

## 2020-06-04 NOTE — Progress Notes (Signed)
   Subjective:    Patient ID: [name], female    DOB: 12-Jul-1946, 74 y.o.   MRN: [id number]  HPI  Today's visit was for Transitional Care Management.  The patient was discharged from [facility] on 05/27/20 with a primary diagnosis of acute on chronic respiratory failure,CAP and smoking.[name] with the patient and/or caregiver, by a clinical staff member, was made on 05/28/20 and was documented as a telephone encounter within the EMR.  Through chart review and discussion with the patient I have determined that management of their condition is of moderate complexity.    Patient went to the ED with sob . She was admitted for several days and treated with breathing treatments and IV antibiotics. Upon discharge she is doing better. Still has some SOB. She is on oxygen at home intermittently. Has not been able to stop smoking. She has ran out of her nebulizer meds.       Review of Systems  HENT: Negative.   Respiratory: Positive for cough and shortness of breath.   Cardiovascular: Positive for leg swelling.  Gastrointestinal: Negative.   Genitourinary: Negative.   Neurological: Negative.   Psychiatric/Behavioral: Negative.   All other systems reviewed and are negative.      Objective:   Physical Exam Vitals and nursing note reviewed.  Constitutional:      Appearance: Normal appearance.  Cardiovascular:     Rate and Rhythm: Normal rate and regular rhythm.     Heart sounds: Normal heart sounds.  Pulmonary:     Breath sounds: Stridor present. Wheezing (insp and exp throughout) present.  Skin:    General: Skin is warm.  Neurological:     General: No focal deficit present.     Mental Status: She is alert and oriented to person, place, and time.  Psychiatric:        Mood and Affect: Mood normal.    BP (!) 153/84   Pulse 89   Temp (!) 97.2 F (36.2 C) (Temporal)   Ht 5\' 5"  (1.651 m)   Wt 188 lb (85.3 kg)   SpO2 94%   BMI 31.28 kg/m         Assessment  & Plan:  [name] in today with chief complaint of Transitions Of Care   1. COPD (chronic obstructive pulmonary disease) with chronic bronchitis (HCC) Oxygen at home as much as possible - levalbuterol (XOPENEX) 1.25 MG/0.5ML nebulizer solution; Take 1.25 mg by nebulization every 6 (six) hours as needed for wheezing or shortness of breath.  Dispense: 1 each; Refill: 12  2. Encounter for support and coordination of transition of care Hospital records reviewed  Nicotine patches at home- [place]!!!   The above assessment and management plan was discussed with the patient. The patient verbalized understanding of and has agreed to the management plan. Patient is aware to call the clinic if symptoms persist or worsen. Patient is aware when to return to the clinic for a follow-up visit. Patient educated on when it is appropriate to go to the emergency department.   [name], FNP

## 2020-06-04 NOTE — Patient Instructions (Signed)
Nicotine skin patches What is this medicine? NICOTINE ([place]) helps people stop smoking. The patches replace the nicotine found in cigarettes and help to decrease withdrawal effects. They are most effective when used in combination with a stop-smoking program. This medicine may be used for other purposes; ask your health care provider or pharmacist if you have questions. COMMON BRAND NAME(S): Habitrol, Nicoderm CQ, Nicotrol What should I tell my health care provider before I take this medicine? They need to know if you have any of these conditions:  diabetes  heart disease, angina, irregular heartbeat or previous heart attack  high blood pressure  lung disease, including asthma  overactive thyroid  pheochromocytoma  seizures or a history of seizures  skin problems, like eczema  stomach problems or ulcers  an unusual or allergic reaction to nicotine, adhesives, other medicines, foods, dyes, or preservatives  pregnant or trying to get pregnant  breast-feeding How should I use this medicine? This medicine is for use on the skin. Follow the directions that come with the patches. Find an area of skin on your upper arm, chest, or back that is clean, dry, greaseless, undamaged and hairless. Wash hands with plain soap and water. Do not use anything that contains aloe, lanolin or glycerin as these may prevent the patch from sticking. Dry thoroughly. Remove the patch from the sealed pouch. Do not try to cut or trim the patch. Using your palm, press the patch firmly in place for 10 seconds to make sure that there is good contact with your skin. After applying the patch, wash your hands. Change the patch every day, keeping to a regular schedule. When you apply a new patch, use a new area of skin. Wait at least 1 week before using the same area again. Talk to your pediatrician regarding the use of this medicine in children. Special care may be needed. Overdosage: If you think you have  taken too much of this medicine contact a poison control center or emergency room at once. NOTE: This medicine is only for you. Do not share this medicine with others. What if I miss a dose? If you forget to replace a patch, use it as soon as you can. Only use one patch at a time and do not leave on the skin for longer than directed. If a patch falls off, you can replace it, but keep to your schedule and remove the patch at the right time. What may interact with this medicine?  medicines for asthma  medicines for blood pressure  medicines for mental depression This list may not describe all possible interactions. Give your health care provider a list of all the medicines, herbs, non-prescription drugs, or dietary supplements you use. Also tell them if you smoke, drink alcohol, or use illegal drugs. Some items may interact with your medicine. What should I watch for while using this medicine? You should begin using the nicotine patch the day you stop smoking. It is okay if you do not succeed at your attempt to quit and have a cigarette. You can still continue your quit attempt and keep using the product as directed. Just throw away your cigarettes and get back to your quit plan. You can keep the patch in place during swimming, bathing, and showering. If your patch falls off during these activities, replace it. When you first apply the patch, your skin may itch or burn. This should go away soon. When you remove a patch, the skin may look red, but  this should only last for a few days. Call your doctor or health care professional if skin redness does not go away after 4 days, if your skin swells, or if you get a rash. If you are a diabetic and you quit smoking, the effects of insulin may be increased and you may need to reduce your insulin dose. Check with your doctor or health care professional about how you should adjust your insulin dose. If you are going to have a magnetic resonance imaging (MRI)  procedure, tell your MRI technician if you have this patch on your body. It must be removed before a MRI. What side effects may I notice from receiving this medicine? Side effects that you should report to your doctor or health care professional as soon as possible:  allergic reactions like skin rash, itching or hives, swelling of the face, lips, or tongue  breathing problems  changes in hearing  changes in vision  chest pain  cold sweats  confusion  fast, irregular heartbeat  feeling faint or lightheaded, falls  headache  increased saliva  skin redness that lasts more than 4 days  stomach pain  signs and symptoms of nicotine overdose like nausea; vomiting; dizziness; weakness; and rapid heartbeat Side effects that usually do not require medical attention (report to your doctor or health care professional if they continue or are bothersome):  diarrhea  dry mouth  hiccups  irritability  nervousness or restlessness  trouble sleeping or vivid dreams This list may not describe all possible side effects. Call your doctor for medical advice about side effects. You may report side effects to FDA at 1-800-FDA-1088. Where should I keep my medicine? Keep out of the reach of children. Store at room temperature between 20 and 25 degrees C (68 and 77 degrees F). Protect from heat and light. Store in International aid/development worker until ready to use. Throw away unused medicine after the expiration date. When you remove a patch, fold with sticky sides together; put in an empty opened pouch and throw away. NOTE: This sheet is a summary. It may not cover all possible information. If you have questions about this medicine, talk to your doctor, pharmacist, or health care provider.  2020 Elsevier/Gold Standard (2014-08-18 15:46:21)

## 2020-06-10 ENCOUNTER — Ambulatory Visit: Payer: Medicare Other | Admitting: Gastroenterology

## 2020-06-11 ENCOUNTER — Encounter: Payer: Self-pay | Admitting: Nurse Practitioner

## 2020-06-11 ENCOUNTER — Other Ambulatory Visit: Payer: Self-pay

## 2020-06-11 ENCOUNTER — Ambulatory Visit (INDEPENDENT_AMBULATORY_CARE_PROVIDER_SITE_OTHER): Payer: Medicare Other | Admitting: Nurse Practitioner

## 2020-06-11 VITALS — BP 138/88 | HR 91 | Temp 98.4°F | Resp 20 | Ht 65.0 in | Wt 186.0 lb

## 2020-06-11 DIAGNOSIS — J449 Chronic obstructive pulmonary disease, unspecified: Secondary | ICD-10-CM

## 2020-06-11 DIAGNOSIS — M25512 Pain in left shoulder: Secondary | ICD-10-CM | POA: Diagnosis not present

## 2020-06-11 DIAGNOSIS — J4489 Other specified chronic obstructive pulmonary disease: Secondary | ICD-10-CM

## 2020-06-11 NOTE — Progress Notes (Signed)
Subjective:    Patient ID: [name], female    DOB: 03-29-1946, 74 y.o.   MRN: [id number]   Chief Complaint: No chief complaint on file.   HPI Recent admission/discharge for CAP, here for follow up on acute on chronic respiratory failure with hypoxia. Wears O2 at home (2L) intermittently at home, states she wears it daily when she feels SOB. Checks O2 sat frequently and will put on O2 when it is low. She c/o left shoulder pain that radiates down arm and makes it numb.    Review of Systems  Constitutional: Negative.   HENT: Negative.   Eyes: Negative.   Respiratory: Positive for cough and shortness of breath.   Cardiovascular: Negative.   Gastrointestinal: Negative.   Endocrine: Negative.   Genitourinary: Negative.   Musculoskeletal: Negative.   Allergic/Immunologic: Negative.   Neurological: Negative.   Hematological: Negative.   Psychiatric/Behavioral: Negative.   All other systems reviewed and are negative.      Objective:   Physical Exam Vitals and nursing note reviewed.  Constitutional:      Appearance: Normal appearance.  HENT:     Head: Normocephalic and atraumatic.     Right Ear: External ear normal.     Left Ear: External ear normal.     Nose: Nose normal.     Mouth/Throat:     Mouth: Mucous membranes are moist.     Pharynx: Oropharynx is clear.  Eyes:     Extraocular Movements: Extraocular movements intact.     Conjunctiva/sclera: Conjunctivae normal.     Pupils: Pupils are equal, round, and reactive to light.  Cardiovascular:     Rate and Rhythm: Normal rate and regular rhythm.     Pulses: Normal pulses.          Radial pulses are 2+ on the right side and 2+ on the left side.     Heart sounds: Normal heart sounds.  Pulmonary:     Effort: Prolonged expiration present.     Breath sounds: Decreased air movement present. Examination of the right-lower field reveals decreased breath sounds. Examination of the left-lower field reveals decreased  breath sounds. Decreased breath sounds present.  Abdominal:     General: Abdomen is flat.     Palpations: Abdomen is soft.  Musculoskeletal:        General: Normal range of motion.     Right shoulder: Normal.     Left shoulder: Tenderness present.     Right upper arm: Normal.     Left upper arm: Tenderness present.       Arms:     Cervical back: Normal range of motion.  Skin:    General: Skin is warm.     Capillary Refill: Capillary refill takes less than 2 seconds.  Neurological:     General: No focal deficit present.     Mental Status: She is alert and oriented to person, place, and time. Mental status is at baseline.  Psychiatric:        Mood and Affect: Mood normal.        Behavior: Behavior normal.        Thought Content: Thought content normal.        Judgment: Judgment normal.    BP 138/88   Pulse 91   Temp 98.4 F (36.9 C) (Temporal)   Resp 20   Ht 5\' 5"  (1.651 m)   Wt 186 lb (84.4 kg)   SpO2 90%   BMI 30.95 kg/m  Assessment & Plan:  KODA DEFRANK in today with chief complaint of 1 week recheck   1. Acute pain of left shoulder Rest Ice BID - Ambulatory referral to Orthopedic Surgery  2. COPD (chronic obstructive pulmonary disease) with chronic bronchitis ([place]) Continue O2 as needed Nebulizer as needed Stop smoking.    The above assessment and management plan was discussed with the patient. The patient verbalized understanding of and has agreed to the management plan. Patient is aware to call the clinic if symptoms persist or worsen. Patient is aware when to return to the clinic for a follow-up visit. Patient educated on when it is appropriate to go to the emergency department.   [name], FNP

## 2020-06-11 NOTE — Patient Instructions (Signed)
Coping with Quitting Smoking  Quitting smoking is a physical and mental challenge. You will face cravings, withdrawal symptoms, and temptation. Before quitting, work with your health care provider to make a plan that can help you cope. Preparation can help you quit and keep you from giving in. How can I cope with cravings? Cravings usually last for 5-10 minutes. If you get through it, the craving will pass. Consider taking the following actions to help you cope with cravings:  Keep your mouth busy: ? Chew sugar-free gum. ? Suck on hard candies or a straw. ? Brush your teeth.  Keep your hands and body busy: ? Immediately change to a different activity when you feel a craving. ? Squeeze or play with a ball. ? Do an activity or a hobby, like making bead jewelry, practicing needlepoint, or working with wood. ? Mix up your normal routine. ? Take a short exercise break. Go for a quick walk or run up and down stairs. ? Spend time in public places where smoking is not allowed.  Focus on doing something kind or helpful for someone else.  Call a friend or family member to talk during a craving.  Join a support group.  Call a quit line, such as 1-800-QUIT-NOW.  Talk with your health care provider about medicines that might help you cope with cravings and make quitting easier for you. How can I deal with withdrawal symptoms? Your body may experience negative effects as it tries to get used to not having nicotine in the system. These effects are called withdrawal symptoms. They may include:  Feeling hungrier than normal.  Trouble concentrating.  Irritability.  Trouble sleeping.  Feeling depressed.  Restlessness and agitation.  Craving a cigarette. To manage withdrawal symptoms:  Avoid places, people, and activities that trigger your cravings.  Remember why you want to quit.  Get plenty of sleep.  Avoid coffee and other caffeinated drinks. These may worsen some of your  symptoms. How can I handle social situations? Social situations can be difficult when you are quitting smoking, especially in the first few weeks. To manage this, you can:  Avoid parties, bars, and other social situations where people might be smoking.  Avoid alcohol.  Leave right away if you have the urge to smoke.  Explain to your family and friends that you are quitting smoking. Ask for understanding and support.  Plan activities with friends or family where smoking is not an option. What are some ways I can cope with stress? Wanting to smoke may cause stress, and stress can make you want to smoke. Find ways to manage your stress. Relaxation techniques can help. For example:  Breathe slowly and deeply, in through your nose and out through your mouth.  Listen to soothing, relaxing music.  Talk with a family member or friend about your stress.  Light a candle.  Soak in a bath or take a shower.  Think about a peaceful place. What are some ways I can prevent weight gain? Be aware that many people gain weight after they quit smoking. However, not everyone does. To keep from gaining weight, have a plan in place before you quit and stick to the plan after you quit. Your plan should include:  Having healthy snacks. When you have a craving, it may help to: ? Eat plain popcorn, crunchy carrots, celery, or other cut vegetables. ? Chew sugar-free gum.  Changing how you eat: ? Eat small portion sizes at meals. ? Eat 4-6 small meals   throughout the day instead of 1-2 large meals a day. ? Be mindful when you eat. Do not watch television or do other things that might distract you as you eat.  Exercising regularly: ? Make time to exercise each day. If you do not have time for a long workout, do short bouts of exercise for 5-10 minutes several times a day. ? Do some form of strengthening exercise, like weight lifting, and some form of aerobic exercise, like running or swimming.  Drinking  plenty of water or other low-calorie or no-calorie drinks. Drink 6-8 glasses of water daily, or as much as instructed by your health care provider. Summary  Quitting smoking is a physical and mental challenge. You will face cravings, withdrawal symptoms, and temptation to smoke again. Preparation can help you as you go through these challenges.  You can cope with cravings by keeping your mouth busy (such as by chewing gum), keeping your body and hands busy, and making calls to family, friends, or a helpline for people who want to quit smoking.  You can cope with withdrawal symptoms by avoiding places where people smoke, avoiding drinks with caffeine, and getting plenty of rest.  Ask your health care provider about the different ways to prevent weight gain, avoid stress, and handle social situations. This information is not intended to replace advice given to you by your health care provider. Make sure you discuss any questions you have with your health care provider. Document Revised: 09/01/2017 Document Reviewed: 09/16/2016 Elsevier Patient Education  2020 Elsevier Inc.  

## 2020-06-17 ENCOUNTER — Telehealth: Payer: Self-pay | Admitting: Nurse Practitioner

## 2020-06-17 NOTE — Telephone Encounter (Signed)
REFERRAL REQUEST Telephone Note  Have you been seen at our office for this problem? Yes (Advise that they may need an appointment with their PCP before a referral can be done)  Reason for Referral: Patient needs Ref to Ortho - Ref was placed and [facility] was placed in the Department before I could work Ref and edit it because Patient would like to be seen at [facility] closed the Ref and a new one will need to be placed. Referral discussed with patient: Yes Best contact number of patient for referral team:    Has patient been seen by a specialist for this issue before:  Patient provider preference for referral: [facility]  Patient location preference for referral:    Patient notified that referrals can take up to a week or longer to process. If they haven't heard anything within a week they should call back and speak with the referral department.

## 2020-06-19 ENCOUNTER — Ambulatory Visit: Payer: Medicare Other | Admitting: Orthopaedic Surgery

## 2020-06-22 ENCOUNTER — Other Ambulatory Visit: Payer: Self-pay | Admitting: Nurse Practitioner

## 2020-06-22 DIAGNOSIS — M25512 Pain in left shoulder: Secondary | ICD-10-CM

## 2020-06-23 DIAGNOSIS — J441 Chronic obstructive pulmonary disease with (acute) exacerbation: Secondary | ICD-10-CM | POA: Diagnosis not present

## 2020-06-30 ENCOUNTER — Other Ambulatory Visit (HOSPITAL_COMMUNITY): Payer: Self-pay | Admitting: Neurosurgery

## 2020-06-30 ENCOUNTER — Other Ambulatory Visit: Payer: Self-pay | Admitting: Neurosurgery

## 2020-06-30 DIAGNOSIS — M4310 Spondylolisthesis, site unspecified: Secondary | ICD-10-CM | POA: Insufficient documentation

## 2020-06-30 DIAGNOSIS — M5412 Radiculopathy, cervical region: Secondary | ICD-10-CM | POA: Insufficient documentation

## 2020-06-30 DIAGNOSIS — M542 Cervicalgia: Secondary | ICD-10-CM | POA: Insufficient documentation

## 2020-06-30 DIAGNOSIS — R030 Elevated blood-pressure reading, without diagnosis of hypertension: Secondary | ICD-10-CM | POA: Insufficient documentation

## 2020-07-02 ENCOUNTER — Ambulatory Visit (INDEPENDENT_AMBULATORY_CARE_PROVIDER_SITE_OTHER): Payer: Medicare Other | Admitting: Nurse Practitioner

## 2020-07-02 ENCOUNTER — Other Ambulatory Visit: Payer: Self-pay

## 2020-07-02 ENCOUNTER — Encounter: Payer: Self-pay | Admitting: Nurse Practitioner

## 2020-07-02 VITALS — BP 146/82 | HR 81 | Temp 98.1°F | Resp 22 | Ht 65.0 in | Wt 186.2 lb

## 2020-07-02 DIAGNOSIS — K219 Gastro-esophageal reflux disease without esophagitis: Secondary | ICD-10-CM

## 2020-07-02 DIAGNOSIS — J449 Chronic obstructive pulmonary disease, unspecified: Secondary | ICD-10-CM

## 2020-07-02 DIAGNOSIS — K580 Irritable bowel syndrome with diarrhea: Secondary | ICD-10-CM

## 2020-07-02 DIAGNOSIS — M545 Low back pain: Secondary | ICD-10-CM | POA: Diagnosis not present

## 2020-07-02 DIAGNOSIS — I700 Atherosclerosis of aorta: Secondary | ICD-10-CM | POA: Diagnosis not present

## 2020-07-02 DIAGNOSIS — I10 Essential (primary) hypertension: Secondary | ICD-10-CM

## 2020-07-02 DIAGNOSIS — E876 Hypokalemia: Secondary | ICD-10-CM

## 2020-07-02 DIAGNOSIS — E785 Hyperlipidemia, unspecified: Secondary | ICD-10-CM

## 2020-07-02 DIAGNOSIS — G8929 Other chronic pain: Secondary | ICD-10-CM | POA: Diagnosis not present

## 2020-07-02 DIAGNOSIS — F419 Anxiety disorder, unspecified: Secondary | ICD-10-CM

## 2020-07-02 DIAGNOSIS — F3342 Major depressive disorder, recurrent, in full remission: Secondary | ICD-10-CM

## 2020-07-02 DIAGNOSIS — Z6832 Body mass index (BMI) 32.0-32.9, adult: Secondary | ICD-10-CM

## 2020-07-02 MED ORDER — HYDROCODONE-ACETAMINOPHEN 10-325 MG PO TABS: 1.0000 | ORAL_TABLET | Freq: Three times a day (TID) | ORAL | 0 refills | Status: DC | PRN

## 2020-07-02 MED ORDER — IPRATROPIUM BROMIDE 0.02 % IN SOLN: 0.5000 mg | Freq: Four times a day (QID) | RESPIRATORY_TRACT | 12 refills | Status: DC | PRN

## 2020-07-02 MED ORDER — ALPRAZOLAM 0.5 MG PO TABS: 0.5000 mg | ORAL_TABLET | Freq: Three times a day (TID) | ORAL | 2 refills | Status: DC | PRN

## 2020-07-02 NOTE — Patient Instructions (Signed)
DASH Eating Plan DASH stands for "Dietary Approaches to Stop Hypertension." The DASH eating plan is a healthy eating plan that has been shown to reduce high blood pressure (hypertension). It may also reduce your risk for type 2 diabetes, heart disease, and stroke. The DASH eating plan may also help with weight loss. What are tips for following this plan?  General guidelines  Avoid eating more than 2,300 mg (milligrams) of salt (sodium) a day. If you have hypertension, you may need to reduce your sodium intake to 1,500 mg a day.  Limit alcohol intake to no more than 1 drink a day for nonpregnant women and 2 drinks a day for men. One drink equals 12 oz of beer, 5 oz of wine, or 1 oz of hard liquor.  Work with your health care provider to maintain a healthy body weight or to lose weight. Ask what an ideal weight is for you.  Get at least 30 minutes of exercise that causes your heart to beat faster (aerobic exercise) most days of the week. Activities may include walking, swimming, or biking.  Work with your health care provider or diet and nutrition specialist (dietitian) to adjust your eating plan to your individual calorie needs. Reading food labels   Check food labels for the amount of sodium per serving. Choose foods with less than 5 percent of the Daily Value of sodium. Generally, foods with less than 300 mg of sodium per serving fit into this eating plan.  To find whole grains, look for the word "whole" as the first word in the ingredient list. Shopping  Buy products labeled as "low-sodium" or "no salt added."  Buy fresh foods. Avoid canned foods and premade or frozen meals. Cooking  Avoid adding salt when cooking. Use salt-free seasonings or herbs instead of table salt or sea salt. Check with your health care provider or pharmacist before using salt substitutes.  Do not fry foods. Cook foods using healthy methods such as baking, boiling, grilling, and broiling instead.  Cook with  heart-healthy oils, such as olive, canola, soybean, or sunflower oil. Meal planning  Eat a balanced diet that includes: ? 5 or more servings of fruits and vegetables each day. At each meal, try to fill half of your plate with fruits and vegetables. ? Up to 6-8 servings of whole grains each day. ? Less than 6 oz of lean meat, poultry, or fish each day. A 3-oz serving of meat is about the same size as a deck of cards. One egg equals 1 oz. ? 2 servings of low-fat dairy each day. ? A serving of nuts, seeds, or beans 5 times each week. ? Heart-healthy fats. Healthy fats called Omega-3 fatty acids are found in foods such as flaxseeds and coldwater fish, like sardines, salmon, and mackerel.  Limit how much you eat of the following: ? Canned or prepackaged foods. ? Food that is high in trans fat, such as fried foods. ? Food that is high in saturated fat, such as fatty meat. ? Sweets, desserts, sugary drinks, and other foods with added sugar. ? Full-fat dairy products.  Do not salt foods before eating.  Try to eat at least 2 vegetarian meals each week.  Eat more home-cooked food and less restaurant, buffet, and fast food.  When eating at a restaurant, ask that your food be prepared with less salt or no salt, if possible. What foods are recommended? The items listed may not be a complete list. Talk with your dietitian about   what dietary choices are best for you. Grains Whole-grain or whole-wheat bread. Whole-grain or whole-wheat pasta. Brown rice. Oatmeal. Quinoa. Bulgur. Whole-grain and low-sodium cereals. Pita bread. Low-fat, low-sodium crackers. Whole-wheat flour tortillas. Vegetables Fresh or frozen vegetables (raw, steamed, roasted, or grilled). Low-sodium or reduced-sodium tomato and vegetable juice. Low-sodium or reduced-sodium tomato sauce and tomato paste. Low-sodium or reduced-sodium canned vegetables. Fruits All fresh, dried, or frozen fruit. Canned fruit in natural juice (without  added sugar). Meat and other protein foods Skinless chicken or turkey. Ground chicken or turkey. Pork with fat trimmed off. Fish and seafood. Egg whites. Dried beans, peas, or lentils. Unsalted nuts, nut butters, and seeds. Unsalted canned beans. Lean cuts of beef with fat trimmed off. Low-sodium, lean deli meat. Dairy Low-fat (1%) or fat-free (skim) milk. Fat-free, low-fat, or reduced-fat cheeses. Nonfat, low-sodium ricotta or cottage cheese. Low-fat or nonfat yogurt. Low-fat, low-sodium cheese. Fats and oils Soft margarine without trans fats. Vegetable oil. Low-fat, reduced-fat, or light mayonnaise and salad dressings (reduced-sodium). Canola, safflower, olive, soybean, and sunflower oils. Avocado. Seasoning and other foods Herbs. Spices. Seasoning mixes without salt. Unsalted popcorn and pretzels. Fat-free sweets. What foods are not recommended? The items listed may not be a complete list. Talk with your dietitian about what dietary choices are best for you. Grains Baked goods made with fat, such as croissants, muffins, or some breads. Dry pasta or rice meal packs. Vegetables Creamed or fried vegetables. Vegetables in a cheese sauce. Regular canned vegetables (not low-sodium or reduced-sodium). Regular canned tomato sauce and paste (not low-sodium or reduced-sodium). Regular tomato and vegetable juice (not low-sodium or reduced-sodium). Pickles. Olives. Fruits Canned fruit in a light or heavy syrup. Fried fruit. Fruit in cream or butter sauce. Meat and other protein foods Fatty cuts of meat. Ribs. Fried meat. Bacon. Sausage. Bologna and other processed lunch meats. Salami. Fatback. Hotdogs. Bratwurst. Salted nuts and seeds. Canned beans with added salt. Canned or smoked fish. Whole eggs or egg yolks. Chicken or turkey with skin. Dairy Whole or 2% milk, cream, and half-and-half. Whole or full-fat cream cheese. Whole-fat or sweetened yogurt. Full-fat cheese. Nondairy creamers. Whipped toppings.  Processed cheese and cheese spreads. Fats and oils Butter. Stick margarine. Lard. Shortening. Ghee. Bacon fat. Tropical oils, such as coconut, palm kernel, or palm oil. Seasoning and other foods Salted popcorn and pretzels. Onion salt, garlic salt, seasoned salt, table salt, and sea salt. Worcestershire sauce. Tartar sauce. Barbecue sauce. Teriyaki sauce. Soy sauce, including reduced-sodium. Steak sauce. Canned and packaged gravies. Fish sauce. Oyster sauce. Cocktail sauce. Horseradish that you find on the shelf. Ketchup. Mustard. Meat flavorings and tenderizers. Bouillon cubes. Hot sauce and Tabasco sauce. Premade or packaged marinades. Premade or packaged taco seasonings. Relishes. Regular salad dressings. Where to find more information:  National Heart, Lung, and Blood Institute: www.nhlbi.nih.gov  American Heart Association: www.heart.org Summary  The DASH eating plan is a healthy eating plan that has been shown to reduce high blood pressure (hypertension). It may also reduce your risk for type 2 diabetes, heart disease, and stroke.  With the DASH eating plan, you should limit salt (sodium) intake to 2,300 mg a day. If you have hypertension, you may need to reduce your sodium intake to 1,500 mg a day.  When on the DASH eating plan, aim to eat more fresh fruits and vegetables, whole grains, lean proteins, low-fat dairy, and heart-healthy fats.  Work with your health care provider or diet and nutrition specialist (dietitian) to adjust your eating plan to your   individual calorie needs. This information is not intended to replace advice given to you by your health care provider. Make sure you discuss any questions you have with your health care provider. Document Revised: 09/01/2017 Document Reviewed: 09/12/2016 Elsevier Patient Education  2020 Elsevier Inc.  

## 2020-07-02 NOTE — Progress Notes (Signed)
Subjective:    Patient ID: [name], female    DOB: 1946/01/05, 74 y.o.   MRN: [id number]   Chief Complaint: Medical Management of Chronic Issues   HPI  Chief Complaint: Medical Management of Chronic Issues    HPI:  1. Benign hypertension No c/o chest pain or headache. Does not check blood pressure at home, BP Readings from Last 3 Encounters:  07/02/20 (!) 146/82  06/11/20 138/88  06/04/20 (!) 153/84     2. Aortic atherosclerosis ([place]) Has not seen cardiology in several years. Does not feel need ot go.  3. COPD (chronic obstructive pulmonary disease) with chronic bronchitis (HCC) Still wheezing with some SOB. She is on BREO and uses proair at least every other day. She uses nebulizer 2-3 x a day. She is on oxygen daily. She is still smoking over a pack a day.  4. Hyperlipidemia with target LDL less than 100 Does not watch diet and does very little exercise. Lab Results  Component Value Date   CHOL 199 01/21/2020   HDL 72 01/21/2020   LDLCALC 106 (H) 01/21/2020   TRIG 122 01/21/2020   CHOLHDL 2.8 01/21/2020     5. Gastroesophageal reflux disease without esophagitis Takes protonix daily and that seems to keep symptoms under fairly good control.  6. Irritable bowel syndrome with diarrhea Takes bentyl which helps. Has mainly diarrhea. She is suppose to follow up with Dr. Oneita Jolly , but sh ecancelled appointmnet.  7. Hypokalemia No c/o lower ext cramps  8. Anxiety Stays stressed is on xanax 2x a day. Says she acan not go with her xanax. We were able to cut her back to 2 pills a day. GAD 7 : Generalized Anxiety Score 07/02/2020 04/07/2020 01/09/2020 10/10/2019  Nervous, Anxious, on Edge 2 3 3 2   Control/stop worrying 2 3 2 2   Worry too much - different things 2 3 2 2   Trouble relaxing 0 3 1 1   Restless 1 0 1 1  Easily annoyed or irritable 1 0 0 0  Afraid - awful might happen 0 0 - 0  Total GAD 7 Score 8 12 - 8  Anxiety Difficulty Somewhat difficult Not  difficult at all Somewhat difficult Not difficult at all     9. Chronic midline low back pain without sciatica Pain assessment: Cause of pain- DDD  Pain location- ow back pain and now shoulder pain Pain on scale of 1-10- 5/10 Frequency- daily What increases pain-movement of left arm and walking  To much What makes pain Better-rest Effects on ADL - none Any change in general medical condition-none  Current opioids rx- norco 10/325 TID # meds rx- 90 Effectiveness of current meds-helps Adverse reactions from pain meds-none Morphine equivalent- 30 MME daily  Pill count performed-No Last drug screen - 04/16/19 ( high risk q77m, moderate risk q25m, low risk yearly ) Urine drug screen today- Yes Was the [place] reviewed- yes  If yes were their any concerning findings? - no   Overdose risk: 2   Pain contract signed on:07/15/19   10. Recurrent major depressive disorder, in full remission ([place]) Is on paxil and is doing ok. She has been depressed since her husband died Depression screen [facility] November 13, 2022 07/02/2020 06/11/2020 04/07/2020  Decreased Interest 0 0 0  Down, Depressed, Hopeless - 0 0  PHQ - 2 Score 0 0 0  Altered sleeping - - -  Tired, decreased energy - - -  Change in appetite - - -  Feeling bad or  failure about yourself  - - -  Trouble concentrating - - -  Moving slowly or fidgety/restless - - -  Suicidal thoughts - - -  PHQ-9 Score - - -  Difficult doing work/chores - - -  Some recent data might be hidden     11. BMI 32.0-32.9,adult No recent weight changes Wt Readings from Last 3 Encounters:  07/02/20 186 lb 3.2 oz (84.5 kg)  06/11/20 186 lb (84.4 kg)  06/04/20 188 lb (85.3 kg)   BMI Readings from Last 3 Encounters:  07/02/20 30.99 kg/m  06/11/20 30.95 kg/m  06/04/20 31.28 kg/m       Outpatient Encounter Medications as of 07/02/2020  Medication Sig  . albuterol (PROAIR HFA) 108 (90 Base) MCG/ACT inhaler Inhale 2 puffs by mouth every 6 hours as needed for  wheezing or shortness of breath  . ALPRAZolam (XANAX) 0.5 MG tablet Take 1 tablet (0.5 mg total) by mouth 3 (three) times daily as needed for anxiety.  . Aspirin-Acetaminophen-Caffeine (GOODYS EXTRA STRENGTH) 500-325-65 MG PACK Take 1 packet by mouth every 8 (eight) hours as needed (for pain.).  Marland Kitchen calcium carbonate (OS-CAL) 600 MG TABS Take 600 mg by mouth daily.   . Cholecalciferol (VITAMIN D3) 1000 UNITS CAPS Take 3,000 mg by mouth daily.   Marland Kitchen dicyclomine (BENTYL) 10 MG capsule Take 1 capsule (10 mg total) by mouth 4 (four) times daily as needed for spasms.  . diphenhydrAMINE (BENADRYL) 25 MG tablet Take 25 mg by mouth 2 (two) times daily as needed. For Allergies  . fluticasone (FLONASE) 50 MCG/ACT nasal spray SPRAY 2 SPRAYS INTO EACH NOSTRIL EVERY DAY  . fluticasone furoate-vilanterol (BREO ELLIPTA) 200-25 MCG/INH AEPB Inhale 1 puff into the lungs daily.  Marland Kitchen guaiFENesin-dextromethorphan (ROBITUSSIN DM) 100-10 MG/5ML syrup Take 10 mLs by mouth every 4 (four) hours as needed for cough.  Marland Kitchen HYDROcodone-acetaminophen (NORCO) 10-325 MG tablet Take 1 tablet by mouth every 8 (eight) hours as needed.  Marland Kitchen ipratropium (ATROVENT) 0.02 % nebulizer solution Take 2.5 mLs (0.5 mg total) by nebulization every 6 (six) hours as needed for wheezing or shortness of breath.  . levalbuterol (XOPENEX) 1.25 MG/0.5ML nebulizer solution Take 1.25 mg by nebulization every 6 (six) hours as needed for wheezing or shortness of breath.  . levalbuterol (XOPENEX) 1.25 MG/3ML nebulizer solution Take 1.25 mg by nebulization every 6 (six) hours as needed.  Marland Kitchen lisinopril-hydrochlorothiazide (ZESTORETIC) 10-12.5 MG tablet Take 1 tablet by mouth daily.  . Multiple Vitamin (MULITIVITAMIN WITH MINERALS) TABS Take 1 tablet by mouth daily.  . pantoprazole (PROTONIX) 40 MG tablet Take 1 tablet (40 mg total) by mouth 2 (two) times daily.  Marland Kitchen PARoxetine (PAXIL) 20 MG tablet Take 1 tablet (20 mg total) by mouth daily.  . Phenazopyridine HCl (AZO  DINE PO) Take 1-2 tablets by mouth.  . nicotine (NICODERM CQ - DOSED IN MG/24 HOURS) 21 mg/24hr patch Place 1 patch (21 mg total) onto the skin daily. (Patient not taking: Reported on 07/02/2020)  . vitamin C (ASCORBIC ACID) 500 MG tablet Take 1,000 mg by mouth daily.  (Patient not taking: Reported on 07/02/2020)   No facility-administered encounter medications on file as of 07/02/2020.    Past Surgical History:  Procedure Laterality Date  . APPENDECTOMY     74 years old  . BACK SURGERY     X 2  . BIOPSY  08/11/2016   Procedure: BIOPSY;  Surgeon: Daneil Dolin, MD;  Location: [facility];  Service: Endoscopy;;  gastric bx's  .  BREAST SURGERY     biopsy only  . CARPAL TUNNEL RELEASE     right hand  . CERVICAL BIOPSY  W/ LOOP ELECTRODE EXCISION  02/2011   CIN-2 with focal ectocervical margin involvement. With CIN-1 ECC negative  . CHOLECYSTECTOMY  1984  . ESOPHAGOGASTRODUODENOSCOPY  12/01/2011   Hiatal hernia/Mild fibrotic pyloric stenosis status post dilation with passage of  the scope.  The previously noted gastric ulcer completely healed  The remainder of the gastric mucosa appeared normal  . ESOPHAGOGASTRODUODENOSCOPY (EGD) WITH PROPOFOL N/A 08/11/2016   Procedure: ESOPHAGOGASTRODUODENOSCOPY (EGD) WITH PROPOFOL;  Surgeon: Daneil Dolin, MD;  Location: [facility];  Service: Endoscopy;  Laterality: N/A;  2:15 PM  . FRACTURE SURGERY  10/18/2016   left knee in 3 places  . MALONEY DILATION  07/28/2011   Schatzki's ring s/p 88F with small UES tear as well, small hh, 1cm pyloric channel ulcer, bx benign without H.Pylori. Mobic/goody's at time.  Marland Kitchen MALONEY DILATION N/A 08/11/2016   Procedure: Venia Minks DILATION;  Surgeon: Daneil Dolin, MD;  Location: [facility];  Service: Endoscopy;  Laterality: N/A;  . ORIF PATELLA Left 10/19/2016   Procedure: OPEN REDUCTION INTERNAL (ORIF) FIXATION PATELLA;  Surgeon: Carole Civil, MD;  Location: [facility];  Service: Orthopedics;  Laterality: Left;    . SPINE SURGERY  2009   cervical disc  . SPINE SURGERY  2009   lumbar  . TUBAL LIGATION  1976  . tumor removed from arm      Family History  Problem Relation Age of Onset  . Heart disease Father        CHF  . Heart disease Mother   . COPD Sister   . Diabetes Sister   . Neuropathy Sister   . COPD Sister   . COPD Sister   . Cancer Sister   . Stroke Brother   . Heart disease Brother   . Colon cancer Neg Hx   . Anesthesia problems Neg Hx   . Hypotension Neg Hx   . Malignant hyperthermia Neg Hx   . Pseudochol deficiency Neg Hx   . Colon polyps Neg Hx     New complaints: Left shoulder pain. Is seeing Dr. Arnoldo Morale  Social history: Lives by herself- daughters check on her some     Review of Systems  Constitutional: Negative for diaphoresis.  Eyes: Negative for pain.  Respiratory: Positive for cough and shortness of breath.   Cardiovascular: Negative for chest pain, palpitations and leg swelling.  Gastrointestinal: Negative for abdominal pain.  Endocrine: Negative for polydipsia.  Musculoskeletal: Positive for arthralgias (left shoulder).  Skin: Negative for rash.  Neurological: Negative for dizziness, weakness and headaches.  Hematological: Does not bruise/bleed easily.  All other systems reviewed and are negative.      Objective:   Physical Exam Vitals and nursing note reviewed.  Constitutional:      General: She is not in acute distress.    Appearance: Normal appearance. She is well-developed.  HENT:     Head: Normocephalic.     Nose: Nose normal.  Eyes:     Pupils: Pupils are equal, round, and reactive to light.  Neck:     Vascular: No carotid bruit or JVD.  Cardiovascular:     Rate and Rhythm: Normal rate and regular rhythm.     Heart sounds: Normal heart sounds.  Pulmonary:     Effort: Pulmonary effort is normal. No respiratory distress.     Breath sounds: Wheezing (exp  throughout) present. No rales.  Chest:     Chest wall: No tenderness.   Abdominal:     General: Bowel sounds are normal. There is no distension or abdominal bruit.     Palpations: Abdomen is soft. There is no hepatomegaly, splenomegaly, mass or pulsatile mass.     Tenderness: There is no abdominal tenderness.  Musculoskeletal:        General: Normal range of motion.     Cervical back: Normal range of motion and neck supple.     Comments: Decreased ROM of left shoulder with pain on abduction and external rotation. Gait slow and steady  Lymphadenopathy:     Cervical: No cervical adenopathy.  Skin:    General: Skin is warm and dry.  Neurological:     Mental Status: She is alert and oriented to person, place, and time.     Deep Tendon Reflexes: Reflexes are normal and symmetric.  Psychiatric:        Behavior: Behavior normal.        Thought Content: Thought content normal.        Judgment: Judgment normal.    BP (!) 146/82   Pulse 81   Temp 98.1 F (36.7 C) (Temporal)   Resp (!) 22   Ht $R'5\' 5"'UX$  (1.651 m)   Wt 186 lb 3.2 oz (84.5 kg)   SpO2 94%   BMI 30.99 kg/m         Assessment & Plan:  [name] comes in today with chief complaint of Medical Management of Chronic Issues   Diagnosis and orders addressed:  1. Benign hypertension Low sodium diet  2. Aortic atherosclerosis ([place]) Will follow up with cardiology if ends up needing shoulder surgery  3. COPD (chronic obstructive pulmonary disease) with chronic bronchitis (HCC) STOP SMOKING!!! - ipratropium (ATROVENT) 0.02 % nebulizer solution; Take 2.5 mLs (0.5 mg total) by nebulization every 6 (six) hours as needed for wheezing or shortness of breath.  Dispense: 75 mL; Refill: 12  4. Hyperlipidemia with target LDL less than 100 Low fat diet - CBC with Differential/Platelet - CMP14+EGFR - Lipid panel  5. Gastroesophageal reflux disease without esophagitis Avoid spicy foods Do not eat 2 hours prior to bedtime  6. Irritable bowel syndrome with diarrhea Needs to follow up with  DR. Roark  7. Hypokalemia Labs pending  8. Anxiety Stress management - ALPRAZolam (XANAX) 0.5 MG tablet; Take 1 tablet (0.5 mg total) by mouth 3 (three) times daily as needed for anxiety.  Dispense: 60 tablet; Refill: 2  9. Chronic midline low back pain without sciatica Mist heat rest - HYDROcodone-acetaminophen (NORCO) 10-325 MG tablet; Take 1 tablet by mouth every 8 (eight) hours as needed.  Dispense: 90 tablet; Refill: 0 - HYDROcodone-acetaminophen (NORCO) 10-325 MG tablet; Take 1 tablet by mouth every 8 (eight) hours as needed.  Dispense: 90 tablet; Refill: 0 - HYDROcodone-acetaminophen (NORCO) 10-325 MG tablet; Take 1 tablet by mouth every 8 (eight) hours as needed.  Dispense: 90 tablet; Refill: 0 - ToxASSURE Select 13 (MW), Urine  10. Recurrent major depressive disorder, in full remission ([place]) Stress management  11. BMI 32.0-32.9,adult Discussed diet and exercise for person with BMI >25 Will recheck weight in 3-6 months    Labs pending Health Maintenance reviewed- patient will schedule Diet and exercise encouraged  Follow up plan: 3 months   [name], FNP

## 2020-07-03 LAB — CBC WITH DIFFERENTIAL/PLATELET
Basophils Absolute: 0.1 10*3/uL (ref 0.0–0.2)
Basos: 1 %
EOS (ABSOLUTE): 0.2 10*3/uL (ref 0.0–0.4)
Eos: 3 %
Hematocrit: 45.6 % (ref 34.0–46.6)
Hemoglobin: 15.1 g/dL (ref 11.1–15.9)
Immature Grans (Abs): 0.1 10*3/uL (ref 0.0–0.1)
Immature Granulocytes: 1 %
Lymphocytes Absolute: 2.4 10*3/uL (ref 0.7–3.1)
Lymphs: 30 %
MCH: 29.4 pg (ref 26.6–33.0)
MCHC: 33.1 g/dL (ref 31.5–35.7)
MCV: 89 fL (ref 79–97)
Monocytes Absolute: 0.7 10*3/uL (ref 0.1–0.9)
Monocytes: 9 %
Neutrophils Absolute: 4.4 10*3/uL (ref 1.4–7.0)
Neutrophils: 56 %
Platelets: 298 10*3/uL (ref 150–450)
RBC: 5.14 x10E6/uL (ref 3.77–5.28)
RDW: 13.1 % (ref 11.7–15.4)
WBC: 7.9 10*3/uL (ref 3.4–10.8)

## 2020-07-03 LAB — LIPID PANEL
Chol/HDL Ratio: 2.7 ratio (ref 0.0–4.4)
Cholesterol, Total: 215 mg/dL — ABNORMAL HIGH (ref 100–199)
HDL: 80 mg/dL (ref >39)
LDL Chol Calc (NIH): 118 mg/dL — ABNORMAL HIGH (ref 0–99)
Triglycerides: 95 mg/dL (ref 0–149)
VLDL Cholesterol Cal: 17 mg/dL (ref 5–40)

## 2020-07-03 LAB — CMP14+EGFR
ALT: 20 IU/L (ref 0–32)
AST: 22 IU/L (ref 0–40)
Albumin/Globulin Ratio: 1.9 (ref 1.2–2.2)
Albumin: 4.2 g/dL (ref 3.7–4.7)
Alkaline Phosphatase: 127 IU/L — ABNORMAL HIGH (ref 44–121)
BUN/Creatinine Ratio: 10 — ABNORMAL LOW (ref 12–28)
BUN: 6 mg/dL — ABNORMAL LOW (ref 8–27)
Bilirubin Total: 0.3 mg/dL (ref 0.0–1.2)
CO2: 30 mmol/L — ABNORMAL HIGH (ref 20–29)
Calcium: 9.9 mg/dL (ref 8.7–10.3)
Chloride: 97 mmol/L (ref 96–106)
Creatinine, Ser: 0.6 mg/dL (ref 0.57–1.00)
GFR calc Af Amer: 105 mL/min/{1.73_m2} (ref >59)
GFR calc non Af Amer: 91 mL/min/{1.73_m2} (ref >59)
Globulin, Total: 2.2 g/dL (ref 1.5–4.5)
Glucose: 89 mg/dL (ref 65–99)
Potassium: 3.7 mmol/L (ref 3.5–5.2)
Sodium: 141 mmol/L (ref 134–144)
Total Protein: 6.4 g/dL (ref 6.0–8.5)

## 2020-07-07 ENCOUNTER — Ambulatory Visit: Payer: Self-pay | Admitting: Nurse Practitioner

## 2020-07-07 LAB — TOXASSURE SELECT 13 (MW), URINE

## 2020-07-09 ENCOUNTER — Telehealth: Payer: Self-pay | Admitting: Orthopedic Surgery

## 2020-07-09 NOTE — Telephone Encounter (Signed)
Her hardware in knee is fine for MRI I called to advise.

## 2020-07-09 NOTE — Telephone Encounter (Signed)
Patient called with question regarding an MRI which has been ordered for her by another provider, Dr Newman Pies, [facility]. Patient is asking about the hardware in her left knee, from surgery by Dr Aline Brochure for knee fracture in 2018. Please advise. Ph# [phone].

## 2020-07-10 ENCOUNTER — Other Ambulatory Visit: Payer: Self-pay

## 2020-07-10 ENCOUNTER — Ambulatory Visit (HOSPITAL_COMMUNITY)
Admission: RE | Admit: 2020-07-10 | Discharge: 2020-07-10 | Disposition: A | Discharge status: Home / Self Care | Payer: Medicare Other | Source: Ambulatory Visit | Attending: Neurosurgery | Admitting: Neurosurgery

## 2020-07-10 DIAGNOSIS — M5031 Other cervical disc degeneration,  high cervical region: Secondary | ICD-10-CM | POA: Diagnosis not present

## 2020-07-10 DIAGNOSIS — M25512 Pain in left shoulder: Secondary | ICD-10-CM | POA: Diagnosis not present

## 2020-07-10 DIAGNOSIS — M5412 Radiculopathy, cervical region: Secondary | ICD-10-CM | POA: Diagnosis not present

## 2020-07-10 DIAGNOSIS — M2578 Osteophyte, vertebrae: Secondary | ICD-10-CM | POA: Diagnosis not present

## 2020-07-10 DIAGNOSIS — R200 Anesthesia of skin: Secondary | ICD-10-CM | POA: Diagnosis not present

## 2020-07-16 ENCOUNTER — Telehealth: Payer: Medicare Other

## 2020-07-20 ENCOUNTER — Ambulatory Visit: Payer: Self-pay | Admitting: Nurse Practitioner

## 2020-07-23 DIAGNOSIS — J441 Chronic obstructive pulmonary disease with (acute) exacerbation: Secondary | ICD-10-CM | POA: Diagnosis not present

## 2020-07-28 DIAGNOSIS — M5020 Other cervical disc displacement, unspecified cervical region: Secondary | ICD-10-CM | POA: Diagnosis not present

## 2020-07-28 DIAGNOSIS — M4722 Other spondylosis with radiculopathy, cervical region: Secondary | ICD-10-CM | POA: Diagnosis not present

## 2020-07-30 ENCOUNTER — Other Ambulatory Visit: Payer: Self-pay | Admitting: Nurse Practitioner

## 2020-08-23 DIAGNOSIS — J441 Chronic obstructive pulmonary disease with (acute) exacerbation: Secondary | ICD-10-CM | POA: Diagnosis not present

## 2020-09-06 ENCOUNTER — Other Ambulatory Visit: Payer: Self-pay | Admitting: Nurse Practitioner

## 2020-09-06 DIAGNOSIS — J209 Acute bronchitis, unspecified: Secondary | ICD-10-CM

## 2020-09-22 DIAGNOSIS — J441 Chronic obstructive pulmonary disease with (acute) exacerbation: Secondary | ICD-10-CM | POA: Diagnosis not present

## 2020-09-24 ENCOUNTER — Other Ambulatory Visit: Payer: Self-pay | Admitting: Nurse Practitioner

## 2020-09-24 DIAGNOSIS — J209 Acute bronchitis, unspecified: Secondary | ICD-10-CM

## 2020-10-01 DIAGNOSIS — H5203 Hypermetropia, bilateral: Secondary | ICD-10-CM | POA: Diagnosis not present

## 2020-10-05 ENCOUNTER — Ambulatory Visit: Payer: Self-pay | Admitting: Nurse Practitioner

## 2020-10-06 ENCOUNTER — Encounter: Payer: Self-pay | Admitting: Nurse Practitioner

## 2020-10-06 ENCOUNTER — Ambulatory Visit (INDEPENDENT_AMBULATORY_CARE_PROVIDER_SITE_OTHER): Payer: Medicare Other | Admitting: Nurse Practitioner

## 2020-10-06 ENCOUNTER — Other Ambulatory Visit: Payer: Self-pay

## 2020-10-06 VITALS — BP 145/84 | HR 74 | Temp 97.7°F | Resp 20 | Ht 65.0 in | Wt 191.0 lb

## 2020-10-06 DIAGNOSIS — I10 Essential (primary) hypertension: Secondary | ICD-10-CM | POA: Diagnosis not present

## 2020-10-06 DIAGNOSIS — K219 Gastro-esophageal reflux disease without esophagitis: Secondary | ICD-10-CM | POA: Diagnosis not present

## 2020-10-06 DIAGNOSIS — M545 Low back pain, unspecified: Secondary | ICD-10-CM

## 2020-10-06 DIAGNOSIS — F3342 Major depressive disorder, recurrent, in full remission: Secondary | ICD-10-CM

## 2020-10-06 DIAGNOSIS — F419 Anxiety disorder, unspecified: Secondary | ICD-10-CM

## 2020-10-06 DIAGNOSIS — I700 Atherosclerosis of aorta: Secondary | ICD-10-CM

## 2020-10-06 DIAGNOSIS — K580 Irritable bowel syndrome with diarrhea: Secondary | ICD-10-CM

## 2020-10-06 DIAGNOSIS — J4489 Other specified chronic obstructive pulmonary disease: Secondary | ICD-10-CM

## 2020-10-06 DIAGNOSIS — G8929 Other chronic pain: Secondary | ICD-10-CM

## 2020-10-06 DIAGNOSIS — E876 Hypokalemia: Secondary | ICD-10-CM | POA: Diagnosis not present

## 2020-10-06 DIAGNOSIS — J449 Chronic obstructive pulmonary disease, unspecified: Secondary | ICD-10-CM

## 2020-10-06 DIAGNOSIS — Z6832 Body mass index (BMI) 32.0-32.9, adult: Secondary | ICD-10-CM

## 2020-10-06 DIAGNOSIS — F17200 Nicotine dependence, unspecified, uncomplicated: Secondary | ICD-10-CM

## 2020-10-06 DIAGNOSIS — Z23 Encounter for immunization: Secondary | ICD-10-CM

## 2020-10-06 DIAGNOSIS — E785 Hyperlipidemia, unspecified: Secondary | ICD-10-CM

## 2020-10-06 MED ORDER — PANTOPRAZOLE SODIUM 40 MG PO TBEC: 40.0000 mg | DELAYED_RELEASE_TABLET | Freq: Two times a day (BID) | ORAL | 1 refills | Status: DC

## 2020-10-06 MED ORDER — HYDROCODONE-ACETAMINOPHEN 10-325 MG PO TABS: 1.0000 | ORAL_TABLET | Freq: Three times a day (TID) | ORAL | 0 refills | Status: DC | PRN

## 2020-10-06 MED ORDER — ALPRAZOLAM 0.5 MG PO TABS: 0.5000 mg | ORAL_TABLET | Freq: Three times a day (TID) | ORAL | 2 refills | Status: DC | PRN

## 2020-10-06 MED ORDER — PAROXETINE HCL 20 MG PO TABS: 20.0000 mg | ORAL_TABLET | Freq: Every day | ORAL | 1 refills | Status: DC

## 2020-10-06 MED ORDER — LISINOPRIL-HYDROCHLOROTHIAZIDE 10-12.5 MG PO TABS: 1.0000 | ORAL_TABLET | Freq: Every day | ORAL | 1 refills | Status: DC

## 2020-10-06 NOTE — Patient Instructions (Signed)
Stress, Adult Stress is a normal reaction to life events. Stress is what you feel when life demands more than you are used to, or more than you think you can handle. Some stress can be useful, such as studying for a test or meeting a deadline at work. Stress that occurs too often or for too long can cause problems. It can affect your emotional health and interfere with relationships and normal daily activities. Too much stress can weaken your body's defense system (immune system) and increase your risk for physical illness. If you already have a medical problem, stress can make it worse. What are the causes? All sorts of life events can cause stress. An event that causes stress for one person may not be stressful for another person. Major life events, whether positive or negative, commonly cause stress. Examples include:  Losing a job or starting a new job.  Losing a loved one.  Moving to a new town or home.  Getting married or divorced.  Having a baby.  Getting injured or sick. Less obvious life events can also cause stress, especially if they occur day after day or in combination with each other. Examples include:  Working long hours.  Driving in traffic.  Caring for children.  Being in debt.  Being in a difficult relationship. What are the signs or symptoms? Stress can cause emotional symptoms, including:  Anxiety. This is feeling worried, afraid, on edge, overwhelmed, or out of control.  Anger, including irritation or impatience.  Depression. This is feeling sad, down, helpless, or guilty.  Trouble focusing, remembering, or making decisions. Stress can cause physical symptoms, including:  Aches and pains. These may affect your head, neck, back, stomach, or other areas of your body.  Tight muscles or a clenched jaw.  Low energy.  Trouble sleeping. Stress can cause unhealthy behaviors, including:  Eating to feel better (overeating) or skipping meals.  Working too  much or putting off tasks.  Smoking, drinking alcohol, or using drugs to feel better. How is this diagnosed? Stress is diagnosed through an assessment by your health care provider. He or she may diagnose this condition based on:  Your symptoms and any stressful life events.  Your medical history.  Tests to rule out other causes of your symptoms. Depending on your condition, your health care provider may refer you to a specialist for further evaluation. How is this treated?  Stress management techniques are the recommended treatment for stress. Medicine is not typically recommended for the treatment of stress. Techniques to reduce your reaction to stressful life events include:  Stress identification. Monitor yourself for symptoms of stress and identify what causes stress for you. These skills may help you to avoid or prepare for stressful events.  Time management. Set your priorities, keep a calendar of events, and learn to say no. Taking these actions can help you avoid making too many commitments. Techniques for coping with stress include:  Rethinking the problem. Try to think realistically about stressful events rather than ignoring them or overreacting. Try to find the positives in a stressful situation rather than focusing on the negatives.  Exercise. Physical exercise can release both physical and emotional tension. The key is to find a form of exercise that you enjoy and do it regularly.  Relaxation techniques. These relax the body and mind. The key is to find one or more that you enjoy and use the techniques regularly. Examples include: ? Meditation, deep breathing, or progressive relaxation techniques. ? Yoga or   tai chi. ? Biofeedback, mindfulness techniques, or journaling. ? Listening to music, being out in nature, or participating in other hobbies.  Practicing a healthy lifestyle. Eat a balanced diet, drink plenty of water, limit or avoid caffeine, and get plenty of  sleep.  Having a strong support network. Spend time with family, friends, or other people you enjoy being around. Express your feelings and talk things over with someone you trust. Counseling or talk therapy with a mental health professional may be helpful if you are having trouble managing stress on your own. Follow these instructions at home: Lifestyle   Avoid drugs.  Do not use any products that contain nicotine or tobacco, such as cigarettes, e-cigarettes, and chewing tobacco. If you need help quitting, ask your health care provider.  Limit alcohol intake to no more than 1 drink a day for nonpregnant women and 2 drinks a day for men. One drink equals 12 oz of beer, 5 oz of wine, or 1 oz of hard liquor  Do not use alcohol or drugs to relax.  Eat a balanced diet that includes fresh fruits and vegetables, whole grains, lean meats, fish, eggs, and beans, and low-fat dairy. Avoid processed foods and foods high in added fat, sugar, and salt.  Exercise at least 30 minutes on 5 or more days each week.  Get 7-8 hours of sleep each night. General instructions   Practice stress management techniques as discussed with your health care provider.  Drink enough fluid to keep your urine clear or pale yellow.  Take over-the-counter and prescription medicines only as told by your health care provider.  Keep all follow-up visits as told by your health care provider. This is important. Contact a health care provider if:  Your symptoms get worse.  You have new symptoms.  You feel overwhelmed by your problems and can no longer manage them on your own. Get help right away if:  You have thoughts of hurting yourself or others. If you ever feel like you may hurt yourself or others, or have thoughts about taking your own life, get help right away. You can go to your nearest emergency department or call:  Your local emergency services (911 in the [place]  A suicide crisis helpline, such as the  [place] at (316) 250-6172. This is open 24 hours a day. Summary  Stress is a normal reaction to life events. It can cause problems if it happens too often or for too long.  Practicing stress management techniques is the best way to treat stress.  Counseling or talk therapy with a mental health professional may be helpful if you are having trouble managing stress on your own. This information is not intended to replace advice given to you by your health care provider. Make sure you discuss any questions you have with your health care provider. Document Revised: 04/19/2019 Document Reviewed: 11/09/2016 Elsevier Patient Education  [place].

## 2020-10-06 NOTE — Progress Notes (Signed)
Subjective:    Patient ID: [name], female    DOB: 04-Jun-1946, 75 y.o.   MRN: [id number]   Chief Complaint: Medical Management of Chronic Issues    HPI:  1. Benign hypertension No c/o chest pain, sob or headache. Does not check blood pressure BP Readings from Last 3 Encounters:  10/06/20 (!) 145/84  07/02/20 (!) 146/82  06/11/20 138/88     2. Hyperlipidemia with target LDL less than 100 Does not watch diet and does no exercise. Lab Results  Component Value Date   CHOL 215 (H) 07/02/2020   HDL 80 07/02/2020   LDLCALC 118 (H) 07/02/2020   TRIG 95 07/02/2020   CHOLHDL 2.7 07/02/2020     3. Hypokalemia Denies any lower ext cramping Lab Results  Component Value Date   K 3.7 07/02/2020     4. Aortic atherosclerosis (HCC) Seen on chest xray  5. COPD (chronic obstructive pulmonary disease) with chronic bronchitis (HCC) Is on breo daily. Has not need xopenex but 1x lately  6. Gastroesophageal reflux disease without esophagitis Is on protonix daily andis doing well.  7. Irritable bowel syndrome with diarrhea Is on bentyl as needed. Has occasional diarrhea.  8. Anxiety We have got her down to 0.5mg  BID. She was on 1mg  TID. She says she just cant go down any further.  9. Recurrent major depressive disorder, in full remission ([place]) Has been on paxil for years. She says she is doing ok. Depression screen [facility] 2/9 10/06/2020 07/02/2020 06/11/2020  Decreased Interest 1 0 0  Down, Depressed, Hopeless 1 - 0  PHQ - 2 Score 2 0 0  Altered sleeping 3 - -  Tired, decreased energy 1 - -  Change in appetite 0 - -  Feeling bad or failure about yourself  0 - -  Trouble concentrating 0 - -  Moving slowly or fidgety/restless 0 - -  Suicidal thoughts 0 - -  PHQ-9 Score 6 - -  Difficult doing work/chores Not difficult at all - -  Some recent data might be hidden     10. Chronic midline low back pain without sciatica Pain assessment: Cause of pain- DDD and shoulder  pain Pain location- shoulder pain Pain on scale of 1-10- 5/10 Frequency- daily What increases pain-nothing What makes pain Better-nothing Effects on ADL - none Any change in general medical condition-none  Current opioids rx- norco 10/325 TID # meds rx- 90 Effectiveness of current meds-helps Adverse reactions from pain meds-none Morphine equivalent- 30MME  Pill count performed-Yes Last drug screen - 07/02/20 ( high risk q56m, moderate risk q41m, low risk yearly ) Urine drug screen today- Yes Was the [place] reviewed- yes  If yes were their any concerning findings? - no   Overdose risk: 1  Pain contract signed on:07/15/19   11. Smoker Still smoking over a pack a day  12. BMI 32.0-32.9,adult Weight is up 5lbs since last visit Wt Readings from Last 3 Encounters:  10/06/20 191 lb (86.6 kg)  07/02/20 186 lb 3.2 oz (84.5 kg)  06/11/20 186 lb (84.4 kg)   BMI Readings from Last 3 Encounters:  10/06/20 31.78 kg/m  07/02/20 30.99 kg/m  06/11/20 30.95 kg/m       Outpatient Encounter Medications as of 10/06/2020  Medication Sig  . albuterol (PROAIR HFA) 108 (90 Base) MCG/ACT inhaler INHALE 2 PUFFS BY MOUTH EVERY 6 HOURS AS NEEDED FOR WHEEZING OR SHORTNESS OF BREATH  . ALPRAZolam (XANAX) 0.5 MG tablet Take 1 tablet (0.5  mg total) by mouth 3 (three) times daily as needed for anxiety.  . calcium carbonate (OS-CAL) 600 MG TABS Take 600 mg by mouth daily.  . Cholecalciferol (VITAMIN D3) 1000 UNITS CAPS Take 3,000 mg by mouth daily.  Marland Kitchen dicyclomine (BENTYL) 10 MG capsule Take 1 capsule (10 mg total) by mouth 4 (four) times daily as needed for spasms.  . diphenhydrAMINE (BENADRYL) 25 MG tablet Take 25 mg by mouth 2 (two) times daily as needed. For Allergies  . fluticasone (FLONASE) 50 MCG/ACT nasal spray SPRAY 2 SPRAYS INTO EACH NOSTRIL EVERY DAY  . fluticasone furoate-vilanterol (BREO ELLIPTA) 200-25 MCG/INH AEPB Inhale 1 puff into the lungs daily.  Marland Kitchen HYDROcodone-acetaminophen  (NORCO) 10-325 MG tablet Take 1 tablet by mouth every 8 (eight) hours as needed.  Marland Kitchen ipratropium (ATROVENT) 0.02 % nebulizer solution Take 2.5 mLs (0.5 mg total) by nebulization every 6 (six) hours as needed for wheezing or shortness of breath.  . levalbuterol (XOPENEX) 1.25 MG/0.5ML nebulizer solution Take 1.25 mg by nebulization every 6 (six) hours as needed for wheezing or shortness of breath.  . levalbuterol (XOPENEX) 1.25 MG/3ML nebulizer solution Take 1.25 mg by nebulization every 6 (six) hours as needed.  Marland Kitchen lisinopril-hydrochlorothiazide (ZESTORETIC) 10-12.5 MG tablet Take 1 tablet by mouth daily.  . Multiple Vitamin (MULITIVITAMIN WITH MINERALS) TABS Take 1 tablet by mouth daily.  . pantoprazole (PROTONIX) 40 MG tablet Take 1 tablet (40 mg total) by mouth 2 (two) times daily.  Marland Kitchen PARoxetine (PAXIL) 20 MG tablet Take 1 tablet (20 mg total) by mouth daily.  . Phenazopyridine HCl (AZO DINE PO) Take 1-2 tablets by mouth.  . vitamin C (ASCORBIC ACID) 500 MG tablet Take 1,000 mg by mouth daily.  Marland Kitchen HYDROcodone-acetaminophen (NORCO) 10-325 MG tablet Take 1 tablet by mouth every 8 (eight) hours as needed.  Marland Kitchen HYDROcodone-acetaminophen (NORCO) 10-325 MG tablet Take 1 tablet by mouth every 8 (eight) hours as needed.  . [DISCONTINUED] nicotine (NICODERM CQ - DOSED IN MG/24 HOURS) 21 mg/24hr patch PLACE 1 PATCH (21 MG TOTAL) ONTO THE SKIN DAILY.   No facility-administered encounter medications on file as of 10/06/2020.    Past Surgical History:  Procedure Laterality Date  . APPENDECTOMY     75 years old  . BACK SURGERY     X 2  . BIOPSY  08/11/2016   Procedure: BIOPSY;  Surgeon: Corbin Ade, MD;  Location: [facility];  Service: Endoscopy;;  gastric bx's  . BREAST SURGERY     biopsy only  . CARPAL TUNNEL RELEASE     right hand  . CERVICAL BIOPSY  W/ LOOP ELECTRODE EXCISION  02/2011   CIN-2 with focal ectocervical margin involvement. With CIN-1 ECC negative  . CHOLECYSTECTOMY  1984  .  ESOPHAGOGASTRODUODENOSCOPY  12/01/2011   Hiatal hernia/Mild fibrotic pyloric stenosis status post dilation with passage of  the scope.  The previously noted gastric ulcer completely healed  The remainder of the gastric mucosa appeared normal  . ESOPHAGOGASTRODUODENOSCOPY (EGD) WITH PROPOFOL N/A 08/11/2016   Procedure: ESOPHAGOGASTRODUODENOSCOPY (EGD) WITH PROPOFOL;  Surgeon: Corbin Ade, MD;  Location: [facility];  Service: Endoscopy;  Laterality: N/A;  2:15 PM  . FRACTURE SURGERY  10/18/2016   left knee in 3 places  . MALONEY DILATION  07/28/2011   Schatzki's ring s/p 64F with small UES tear as well, small hh, 1cm pyloric channel ulcer, bx benign without H.Pylori. Mobic/goody's at time.  Marland Kitchen MALONEY DILATION N/A 08/11/2016   Procedure: MALONEY DILATION;  Surgeon: Daneil Dolin, MD;  Location: [facility];  Service: Endoscopy;  Laterality: N/A;  . ORIF PATELLA Left 10/19/2016   Procedure: OPEN REDUCTION INTERNAL (ORIF) FIXATION PATELLA;  Surgeon: Carole Civil, MD;  Location: [facility];  Service: Orthopedics;  Laterality: Left;  . SPINE SURGERY  2009   cervical disc  . SPINE SURGERY  2009   lumbar  . TUBAL LIGATION  1976  . tumor removed from arm      Family History  Problem Relation Age of Onset  . Heart disease Father        CHF  . Heart disease Mother   . COPD Sister   . Diabetes Sister   . Neuropathy Sister   . COPD Sister   . COPD Sister   . Cancer Sister   . Stroke Brother   . Heart disease Brother   . Colon cancer Neg Hx   . Anesthesia problems Neg Hx   . Hypotension Neg Hx   . Malignant hyperthermia Neg Hx   . Pseudochol deficiency Neg Hx   . Colon polyps Neg Hx     New complaints: None today  Social history: Lives by herself     Review of Systems  Constitutional: Negative for diaphoresis.  Eyes: Negative for pain.  Respiratory: Negative for shortness of breath.   Cardiovascular: Negative for chest pain, palpitations and leg swelling.   Gastrointestinal: Negative for abdominal pain.  Endocrine: Negative for polydipsia.  Musculoskeletal: Positive for arthralgias and back pain.  Skin: Negative for rash.  Neurological: Negative for dizziness, weakness and headaches.  Hematological: Does not bruise/bleed easily.  Psychiatric/Behavioral: The patient is nervous/anxious.   All other systems reviewed and are negative.      Objective:   Physical Exam Vitals and nursing note reviewed.  Constitutional:      General: She is not in acute distress.    Appearance: Normal appearance. She is well-developed and well-nourished.  HENT:     Head: Normocephalic.     Nose: Nose normal.     Mouth/Throat:     Mouth: Oropharynx is clear and moist.  Eyes:     Extraocular Movements: EOM normal.     Pupils: Pupils are equal, round, and reactive to light.  Neck:     Vascular: No carotid bruit or JVD.  Cardiovascular:     Rate and Rhythm: Normal rate and regular rhythm.     Pulses: Intact distal pulses.     Heart sounds: Normal heart sounds.  Pulmonary:     Effort: Pulmonary effort is normal. No respiratory distress.     Breath sounds: Normal breath sounds. No wheezing or rales.  Chest:     Chest wall: No tenderness.  Abdominal:     General: Bowel sounds are normal. There is no distension or abdominal bruit. Aorta is normal.     Palpations: Abdomen is soft. There is no hepatomegaly, splenomegaly, mass or pulsatile mass.     Tenderness: There is no abdominal tenderness.  Musculoskeletal:        General: No edema. Normal range of motion.     Cervical back: Normal range of motion and neck supple.  Lymphadenopathy:     Cervical: No cervical adenopathy.  Skin:    General: Skin is warm and dry.  Neurological:     Mental Status: She is alert and oriented to person, place, and time.     Deep Tendon Reflexes: Reflexes are normal and symmetric.  Psychiatric:  Mood and Affect: Mood and affect normal.        Behavior: Behavior  normal.        Thought Content: Thought content normal.        Judgment: Judgment normal.    BP (!) 145/84   Pulse 74   Temp 97.7 F (36.5 C) (Temporal)   Resp 20   Ht 5\' 5"  (1.651 m)   Wt 191 lb (86.6 kg)   SpO2 90%   BMI 31.78 kg/m         Assessment & Plan:  LENAYAH LANTZER comes in today with chief complaint of Medical Management of Chronic Issues   Diagnosis and orders addressed:  1. Benign hypertension *low sodium diet* - lisinopril-hydrochlorothiazide (ZESTORETIC) 10-12.5 MG tablet; Take 1 tablet by mouth daily.  Dispense: 90 tablet; Refill: 1  2. Hyperlipidemia with target LDL less than 100 Low fat diet  3. Hypokalemia Labs penmding  4. Aortic atherosclerosis ([place])  5. COPD (chronic obstructive pulmonary disease) with chronic bronchitis ([place]) Stop smoking Continue inhalers as rx  6. Gastroesophageal reflux disease without esophagitis Avoid spicy foods Do not eat 2 hours prior to bedtime - pantoprazole (PROTONIX) 40 MG tablet; Take 1 tablet (40 mg total) by mouth 2 (two) times daily.  Dispense: 180 tablet; Refill: 1  7. Irritable bowel syndrome with diarrhea Watch diet to prevent flare ups  8. Anxiety Stress management - ALPRAZolam (XANAX) 0.5 MG tablet; Take 1 tablet (0.5 mg total) by mouth 3 (three) times daily as needed for anxiety.  Dispense: 60 tablet; Refill: 2  9. Recurrent major depressive disorder, in full remission ([place]) - PARoxetine (PAXIL) 20 MG tablet; Take 1 tablet (20 mg total) by mouth daily.  Dispense: 90 tablet; Refill: 1  10. Chronic midline low back pain without sciatica - HYDROcodone-acetaminophen (NORCO) 10-325 MG tablet; Take 1 tablet by mouth every 8 (eight) hours as needed.  Dispense: 90 tablet; Refill: 0 - HYDROcodone-acetaminophen (NORCO) 10-325 MG tablet; Take 1 tablet by mouth every 8 (eight) hours as needed.  Dispense: 90 tablet; Refill: 0 - HYDROcodone-acetaminophen (NORCO) 10-325 MG tablet; Take 1 tablet by mouth  every 8 (eight) hours as needed.  Dispense: 90 tablet; Refill: 0  11. Smoker Smoking cessation encouraged  12. BMI 32.0-32.9,adult Discussed diet and exercise for person with BMI >25 Will recheck weight in 3-6 months   Labs pending Health Maintenance reviewed Diet and exercise encouraged  Follow up plan: 3 months pain management   [name], FNP

## 2020-10-07 LAB — CMP14+EGFR
ALT: 15 IU/L (ref 0–32)
AST: 10 IU/L (ref 0–40)
Albumin/Globulin Ratio: 2.2 (ref 1.2–2.2)
Albumin: 4.3 g/dL (ref 3.7–4.7)
Alkaline Phosphatase: 96 IU/L (ref 44–121)
BUN/Creatinine Ratio: 13 (ref 12–28)
BUN: 7 mg/dL — ABNORMAL LOW (ref 8–27)
Bilirubin Total: 0.3 mg/dL (ref 0.0–1.2)
CO2: 31 mmol/L — ABNORMAL HIGH (ref 20–29)
Calcium: 9.6 mg/dL (ref 8.7–10.3)
Chloride: 97 mmol/L (ref 96–106)
Creatinine, Ser: 0.55 mg/dL — ABNORMAL LOW (ref 0.57–1.00)
GFR calc Af Amer: 108 mL/min/{1.73_m2} (ref >59)
GFR calc non Af Amer: 93 mL/min/{1.73_m2} (ref >59)
Globulin, Total: 2 g/dL (ref 1.5–4.5)
Glucose: 88 mg/dL (ref 65–99)
Potassium: 4.2 mmol/L (ref 3.5–5.2)
Sodium: 138 mmol/L (ref 134–144)
Total Protein: 6.3 g/dL (ref 6.0–8.5)

## 2020-10-07 LAB — CBC WITH DIFFERENTIAL/PLATELET
Basophils Absolute: 0.1 10*3/uL (ref 0.0–0.2)
Basos: 1 %
EOS (ABSOLUTE): 0.3 10*3/uL (ref 0.0–0.4)
Eos: 3 %
Hematocrit: 43.8 % (ref 34.0–46.6)
Hemoglobin: 14.5 g/dL (ref 11.1–15.9)
Immature Grans (Abs): 0 10*3/uL (ref 0.0–0.1)
Immature Granulocytes: 0 %
Lymphocytes Absolute: 3.2 10*3/uL — ABNORMAL HIGH (ref 0.7–3.1)
Lymphs: 33 %
MCH: 29.4 pg (ref 26.6–33.0)
MCHC: 33.1 g/dL (ref 31.5–35.7)
MCV: 89 fL (ref 79–97)
Monocytes Absolute: 0.8 10*3/uL (ref 0.1–0.9)
Monocytes: 8 %
Neutrophils Absolute: 5.2 10*3/uL (ref 1.4–7.0)
Neutrophils: 55 %
Platelets: 284 10*3/uL (ref 150–450)
RBC: 4.94 x10E6/uL (ref 3.77–5.28)
RDW: 12.9 % (ref 11.7–15.4)
WBC: 9.6 10*3/uL (ref 3.4–10.8)

## 2020-10-07 LAB — LIPID PANEL
Chol/HDL Ratio: 2.9 ratio (ref 0.0–4.4)
Cholesterol, Total: 218 mg/dL — ABNORMAL HIGH (ref 100–199)
HDL: 76 mg/dL (ref >39)
LDL Chol Calc (NIH): 114 mg/dL — ABNORMAL HIGH (ref 0–99)
Triglycerides: 163 mg/dL — ABNORMAL HIGH (ref 0–149)
VLDL Cholesterol Cal: 28 mg/dL (ref 5–40)

## 2020-10-08 DIAGNOSIS — Z23 Encounter for immunization: Secondary | ICD-10-CM | POA: Diagnosis not present

## 2020-10-23 DIAGNOSIS — J441 Chronic obstructive pulmonary disease with (acute) exacerbation: Secondary | ICD-10-CM | POA: Diagnosis not present

## 2020-11-02 ENCOUNTER — Other Ambulatory Visit: Payer: Self-pay | Admitting: Nurse Practitioner

## 2020-11-02 DIAGNOSIS — J209 Acute bronchitis, unspecified: Secondary | ICD-10-CM

## 2020-11-23 DIAGNOSIS — J441 Chronic obstructive pulmonary disease with (acute) exacerbation: Secondary | ICD-10-CM | POA: Diagnosis not present

## 2020-11-28 ENCOUNTER — Other Ambulatory Visit: Payer: Self-pay | Admitting: Nurse Practitioner

## 2020-11-28 DIAGNOSIS — J209 Acute bronchitis, unspecified: Secondary | ICD-10-CM

## 2020-12-21 DIAGNOSIS — J441 Chronic obstructive pulmonary disease with (acute) exacerbation: Secondary | ICD-10-CM | POA: Diagnosis not present

## 2021-01-05 ENCOUNTER — Other Ambulatory Visit: Payer: Self-pay

## 2021-01-05 ENCOUNTER — Ambulatory Visit (INDEPENDENT_AMBULATORY_CARE_PROVIDER_SITE_OTHER): Payer: Medicare Other | Admitting: Nurse Practitioner

## 2021-01-05 ENCOUNTER — Encounter: Payer: Self-pay | Admitting: Nurse Practitioner

## 2021-01-05 VITALS — BP 138/78 | HR 76 | Temp 96.7°F | Resp 20 | Ht 65.0 in | Wt 191.0 lb

## 2021-01-05 DIAGNOSIS — J449 Chronic obstructive pulmonary disease, unspecified: Secondary | ICD-10-CM | POA: Diagnosis not present

## 2021-01-05 DIAGNOSIS — K580 Irritable bowel syndrome with diarrhea: Secondary | ICD-10-CM

## 2021-01-05 DIAGNOSIS — E876 Hypokalemia: Secondary | ICD-10-CM

## 2021-01-05 DIAGNOSIS — I10 Essential (primary) hypertension: Secondary | ICD-10-CM | POA: Diagnosis not present

## 2021-01-05 DIAGNOSIS — M545 Low back pain, unspecified: Secondary | ICD-10-CM

## 2021-01-05 DIAGNOSIS — F3342 Major depressive disorder, recurrent, in full remission: Secondary | ICD-10-CM

## 2021-01-05 DIAGNOSIS — K219 Gastro-esophageal reflux disease without esophagitis: Secondary | ICD-10-CM

## 2021-01-05 DIAGNOSIS — F419 Anxiety disorder, unspecified: Secondary | ICD-10-CM

## 2021-01-05 DIAGNOSIS — Z6832 Body mass index (BMI) 32.0-32.9, adult: Secondary | ICD-10-CM

## 2021-01-05 DIAGNOSIS — F17200 Nicotine dependence, unspecified, uncomplicated: Secondary | ICD-10-CM | POA: Diagnosis not present

## 2021-01-05 DIAGNOSIS — K529 Noninfective gastroenteritis and colitis, unspecified: Secondary | ICD-10-CM | POA: Diagnosis not present

## 2021-01-05 DIAGNOSIS — G8929 Other chronic pain: Secondary | ICD-10-CM

## 2021-01-05 DIAGNOSIS — E785 Hyperlipidemia, unspecified: Secondary | ICD-10-CM

## 2021-01-05 MED ORDER — HYDROCODONE-ACETAMINOPHEN 10-325 MG PO TABS: 1.0000 | ORAL_TABLET | Freq: Three times a day (TID) | ORAL | 0 refills | Status: DC | PRN

## 2021-01-05 MED ORDER — PANTOPRAZOLE SODIUM 40 MG PO TBEC: 40.0000 mg | DELAYED_RELEASE_TABLET | Freq: Two times a day (BID) | ORAL | 1 refills | Status: DC

## 2021-01-05 MED ORDER — BREO ELLIPTA 200-25 MCG/INH IN AEPB: 1.0000 | INHALATION_SPRAY | Freq: Every day | RESPIRATORY_TRACT | 5 refills | Status: DC

## 2021-01-05 MED ORDER — PAROXETINE HCL 20 MG PO TABS: 20.0000 mg | ORAL_TABLET | Freq: Every day | ORAL | 1 refills | Status: DC

## 2021-01-05 MED ORDER — IPRATROPIUM BROMIDE 0.02 % IN SOLN: 0.5000 mg | Freq: Four times a day (QID) | RESPIRATORY_TRACT | 12 refills | Status: DC | PRN

## 2021-01-05 MED ORDER — LISINOPRIL-HYDROCHLOROTHIAZIDE 10-12.5 MG PO TABS: 1.0000 | ORAL_TABLET | Freq: Every day | ORAL | 1 refills | Status: DC

## 2021-01-05 MED ORDER — ALPRAZOLAM 0.5 MG PO TABS: 0.5000 mg | ORAL_TABLET | Freq: Three times a day (TID) | ORAL | 2 refills | Status: DC | PRN

## 2021-01-05 NOTE — Patient Instructions (Signed)
Steps to Quit Smoking Smoking tobacco is the leading cause of preventable death. It can affect almost every organ in the body. Smoking puts you and people around you at risk for many serious, long-lasting (chronic) diseases. Quitting smoking can be hard, but it is one of the best things that you can do for your health. It is never too late to quit. How do I get ready to quit? When you decide to quit smoking, make a plan to help you succeed. Before you quit:  Pick a date to quit. Set a date within the next 2 weeks to give you time to prepare.  Write down the reasons why you are quitting. Keep this list in places where you will see it often.  Tell your family, friends, and co-workers that you are quitting. Their support is important.  Talk with your doctor about the choices that may help you quit.  Find out if your health insurance will pay for these treatments.  Know the people, places, things, and activities that make you want to smoke (triggers). Avoid them. What first steps can I take to quit smoking?  Throw away all cigarettes at home, at work, and in your car.  Throw away the things that you use when you smoke, such as ashtrays and lighters.  Clean your car. Make sure to empty the ashtray.  Clean your home, including curtains and carpets. What can I do to help me quit smoking? Talk with your doctor about taking medicines and seeing a counselor at the same time. You are more likely to succeed when you do both.  If you are pregnant or breastfeeding, talk with your doctor about counseling or other ways to quit smoking. Do not take medicine to help you quit smoking unless your doctor tells you to do so. To quit smoking: Quit right away  Quit smoking totally, instead of slowly cutting back on how much you smoke over a period of time.  Go to counseling. You are more likely to quit if you go to counseling sessions regularly. Take medicine You may take medicines to help you quit. Some  medicines need a prescription, and some you can buy over-the-counter. Some medicines may contain a drug called nicotine to replace the nicotine in cigarettes. Medicines may:  Help you to stop having the desire to smoke (cravings).  Help to stop the problems that come when you stop smoking (withdrawal symptoms). Your doctor may ask you to use:  Nicotine patches, gum, or lozenges.  Nicotine inhalers or sprays.  Non-nicotine medicine that is taken by mouth. Find resources Find resources and other ways to help you quit smoking and remain smoke-free after you quit. These resources are most helpful when you use them often. They include:  Online chats with a counselor.  Phone quitlines.  Printed self-help materials.  Support groups or group counseling.  Text messaging programs.  Mobile phone apps. Use apps on your mobile phone or tablet that can help you stick to your quit plan. There are many free apps for mobile phones and tablets as well as websites. Examples include Quit Guide from the CDC and smokefree.gov   What things can I do to make it easier to quit?  Talk to your family and friends. Ask them to support and encourage you.  Call a phone quitline (1-800-QUIT-NOW), reach out to support groups, or work with a counselor.  Ask people who smoke to not smoke around you.  Avoid places that make you want to smoke,   such as: ? Bars. ? Parties. ? Smoke-break areas at work.  Spend time with people who do not smoke.  Lower the stress in your life. Stress can make you want to smoke. Try these things to help your stress: ? Getting regular exercise. ? Doing deep-breathing exercises. ? Doing yoga. ? Meditating. ? Doing a body scan. To do this, close your eyes, focus on one area of your body at a time from head to toe. Notice which parts of your body are tense. Try to relax the muscles in those areas.   How will I feel when I quit smoking? Day 1 to 3 weeks Within the first 24 hours,  you may start to have some problems that come from quitting tobacco. These problems are very bad 2-3 days after you quit, but they do not often last for more than 2-3 weeks. You may get these symptoms:  Mood swings.  Feeling restless, nervous, angry, or annoyed.  Trouble concentrating.  Dizziness.  Strong desire for high-sugar foods and nicotine.  Weight gain.  Trouble pooping (constipation).  Feeling like you may vomit (nausea).  Coughing or a sore throat.  Changes in how the medicines that you take for other issues work in your body.  Depression.  Trouble sleeping (insomnia). Week 3 and afterward After the first 2-3 weeks of quitting, you may start to notice more positive results, such as:  Better sense of smell and taste.  Less coughing and sore throat.  Slower heart rate.  Lower blood pressure.  Clearer skin.  Better breathing.  Fewer sick days. Quitting smoking can be hard. Do not give up if you fail the first time. Some people need to try a few times before they succeed. Do your best to stick to your quit plan, and talk with your doctor if you have any questions or concerns. Summary  Smoking tobacco is the leading cause of preventable death. Quitting smoking can be hard, but it is one of the best things that you can do for your health.  When you decide to quit smoking, make a plan to help you succeed.  Quit smoking right away, not slowly over a period of time.  When you start quitting, seek help from your doctor, family, or friends. This information is not intended to replace advice given to you by your health care provider. Make sure you discuss any questions you have with your health care provider. Document Revised: 06/14/2019 Document Reviewed: 12/08/2018 Elsevier Patient Education  2021 [employer]  

## 2021-01-05 NOTE — Progress Notes (Signed)
Subjective:    Patient ID: CARRI SPILLERS, female    DOB: November 17, 1945, 75 y.o.   MRN: [id number]   Chief Complaint: medical management of chronic issues     HPI:  1. Benign hypertension No c/o chest pain, sob or headache. Does not check blood pressure at home. BP Readings from Last 3 Encounters:  01/05/21 138/78  10/06/20 (!) 145/84  07/02/20 (!) 146/82      2. Hyperlipidemia with target LDL less than 100 Does not watch diet and does very little exercise. She was taken off statin because her LFT's were elevated. Lab Results  Component Value Date   CHOL 218 (H) 10/06/2020   HDL 76 10/06/2020   LDLCALC 114 (H) 10/06/2020   TRIG 163 (H) 10/06/2020   CHOLHDL 2.9 10/06/2020   The 10-year ASCVD risk score Mikey Bussing DC Jr., et al., 2013) is: 31%   3. Hypokalemia No c/o lower ext cramping Lab Results  Component Value Date   K 4.2 10/06/2020     4. COPD (chronic obstructive pulmonary disease) with chronic bronchitis ([place]) Is doing well. Is on BREO and atrovent. Says she is breathing well.  5. Gastroesophageal reflux disease without esophagitis  Is on protonix and works well to keep her symptoms under control.  6. Chronic diarrhea Is tolerable. She has an appointment to see DR. Roark  7. Irritable bowel syndrome with diarrhea Denies any abdominal pain  8. Anxiety Is on xanax. We have weaned her down to 0.5mg  BID- have not been able to wean down anymore. GAD 7 : Generalized Anxiety Score 01/05/2021 07/02/2020 04/07/2020 01/09/2020  Nervous, Anxious, on Edge 2 2 3 3   Control/stop worrying 2 2 3 2   Worry too much - different things 2 2 3 2   Trouble relaxing 0 0 3 1  Restless 1 1 0 1  Easily annoyed or irritable 1 1 0 0  Afraid - awful might happen 0 0 0 -  Total GAD 7 Score 8 8 12  -  Anxiety Difficulty Somewhat difficult Somewhat difficult Not difficult at all Somewhat difficult      9. Recurrent major depressive disorder, in full remission ([place]) Is on paxil daily  and is doing well. Depression screen [facility] 2/9 01/05/2021 10/06/2020 07/02/2020  Decreased Interest 1 1 0  Down, Depressed, Hopeless 1 1 -  PHQ - 2 Score 2 2 0  Altered sleeping 3 3 -  Tired, decreased energy 1 1 -  Change in appetite 0 0 -  Feeling bad or failure about yourself  0 0 -  Trouble concentrating 0 0 -  Moving slowly or fidgety/restless 0 0 -  Suicidal thoughts 0 0 -  PHQ-9 Score 6 6 -  Difficult doing work/chores Not difficult at all Not difficult at all -  Some recent data might be hidden     10. Chronic midline low back pain without sciatica Pain assessment: Cause of pain- DDD- they want her to have surgery Pain location- low back Pain on scale of 1-10- 6/10 currently Frequency- daily What increases pain-to much activity What makes pain Better-rest Effects on ADL - none Any change in general medical condition-none  Current opioids rx- norco 10/325 TID # meds rx- #90 Effectiveness of current meds-helps bring pain level down to 2/10 Adverse reactions from pain meds-none Morphine equivalent- 30 MME  Pill count performed-No Last drug screen - 07/02/20 ( high risk q81m, moderate risk q34m, low risk yearly ) Urine drug screen today- No Was the [employer]  reviewed- yes  If yes were their any concerning findings? - no   Overdose risk: 1  Pain contract signed on:01/05/21   11. Smoker Smokes over a pack a day- sh estartred using her nicotine patches again and has cut back some.  12. BMI 32.0-32.9,adult *no recent weight changes Wt Readings from Last 3 Encounters:  01/05/21 191 lb (86.6 kg)  10/06/20 191 lb (86.6 kg)  07/02/20 186 lb 3.2 oz (84.5 kg)   BMI Readings from Last 3 Encounters:  01/05/21 31.78 kg/m  10/06/20 31.78 kg/m  07/02/20 30.99 kg/m       Outpatient Encounter Medications as of 01/05/2021  Medication Sig  . albuterol (VENTOLIN HFA) 108 (90 Base) MCG/ACT inhaler INHALE 2 PUFFS BY MOUTH EVERY 6 HOURS AS NEEDED FOR WHEEZING OR SHORTNESS OF BREATH   . ALPRAZolam (XANAX) 0.5 MG tablet Take 1 tablet (0.5 mg total) by mouth 3 (three) times daily as needed for anxiety.  . calcium carbonate (OS-CAL) 600 MG TABS Take 600 mg by mouth daily.  . Cholecalciferol (VITAMIN D3) 1000 UNITS CAPS Take 3,000 mg by mouth daily.  Marland Kitchen dicyclomine (BENTYL) 10 MG capsule Take 1 capsule (10 mg total) by mouth 4 (four) times daily as needed for spasms.  . diphenhydrAMINE (BENADRYL) 25 MG tablet Take 25 mg by mouth 2 (two) times daily as needed. For Allergies  . fluticasone (FLONASE) 50 MCG/ACT nasal spray SPRAY 2 SPRAYS INTO EACH NOSTRIL EVERY DAY  . fluticasone furoate-vilanterol (BREO ELLIPTA) 200-25 MCG/INH AEPB Inhale 1 puff into the lungs daily.  Marland Kitchen HYDROcodone-acetaminophen (NORCO) 10-325 MG tablet Take 1 tablet by mouth every 8 (eight) hours as needed.  Marland Kitchen HYDROcodone-acetaminophen (NORCO) 10-325 MG tablet Take 1 tablet by mouth every 8 (eight) hours as needed.  Marland Kitchen HYDROcodone-acetaminophen (NORCO) 10-325 MG tablet Take 1 tablet by mouth every 8 (eight) hours as needed.  Marland Kitchen ipratropium (ATROVENT) 0.02 % nebulizer solution Take 2.5 mLs (0.5 mg total) by nebulization every 6 (six) hours as needed for wheezing or shortness of breath.  . levalbuterol (XOPENEX) 1.25 MG/0.5ML nebulizer solution Take 1.25 mg by nebulization every 6 (six) hours as needed for wheezing or shortness of breath.  . levalbuterol (XOPENEX) 1.25 MG/3ML nebulizer solution Take 1.25 mg by nebulization every 6 (six) hours as needed.  Marland Kitchen lisinopril-hydrochlorothiazide (ZESTORETIC) 10-12.5 MG tablet Take 1 tablet by mouth daily.  . Multiple Vitamin (MULITIVITAMIN WITH MINERALS) TABS Take 1 tablet by mouth daily.  . pantoprazole (PROTONIX) 40 MG tablet Take 1 tablet (40 mg total) by mouth 2 (two) times daily.  Marland Kitchen PARoxetine (PAXIL) 20 MG tablet Take 1 tablet (20 mg total) by mouth daily.  . Phenazopyridine HCl (AZO DINE PO) Take 1-2 tablets by mouth.  . vitamin C (ASCORBIC ACID) 500 MG tablet Take 1,000  mg by mouth daily.   No facility-administered encounter medications on file as of 01/05/2021.    Past Surgical History:  Procedure Laterality Date  . APPENDECTOMY     75 years old  . BACK SURGERY     X 2  . BIOPSY  08/11/2016   Procedure: BIOPSY;  Surgeon: Daneil Dolin, MD;  Location: [facility];  Service: Endoscopy;;  gastric bx's  . BREAST SURGERY     biopsy only  . CARPAL TUNNEL RELEASE     right hand  . CERVICAL BIOPSY  W/ LOOP ELECTRODE EXCISION  02/2011   CIN-2 with focal ectocervical margin involvement. With CIN-1 ECC negative  . CHOLECYSTECTOMY  1984  .  ESOPHAGOGASTRODUODENOSCOPY  12/01/2011   Hiatal hernia/Mild fibrotic pyloric stenosis status post dilation with passage of  the scope.  The previously noted gastric ulcer completely healed  The remainder of the gastric mucosa appeared normal  . ESOPHAGOGASTRODUODENOSCOPY (EGD) WITH PROPOFOL N/A 08/11/2016   Procedure: ESOPHAGOGASTRODUODENOSCOPY (EGD) WITH PROPOFOL;  Surgeon: Daneil Dolin, MD;  Location: [facility];  Service: Endoscopy;  Laterality: N/A;  2:15 PM  . FRACTURE SURGERY  10/18/2016   left knee in 3 places  . MALONEY DILATION  07/28/2011   Schatzki's ring s/p 37F with small UES tear as well, small hh, 1cm pyloric channel ulcer, bx benign without H.Pylori. Mobic/goody's at time.  Marland Kitchen MALONEY DILATION N/A 08/11/2016   Procedure: Venia Minks DILATION;  Surgeon: Daneil Dolin, MD;  Location: [facility];  Service: Endoscopy;  Laterality: N/A;  . ORIF PATELLA Left 10/19/2016   Procedure: OPEN REDUCTION INTERNAL (ORIF) FIXATION PATELLA;  Surgeon: Carole Civil, MD;  Location: [facility];  Service: Orthopedics;  Laterality: Left;  . SPINE SURGERY  2009   cervical disc  . SPINE SURGERY  2009   lumbar  . TUBAL LIGATION  1976  . tumor removed from arm      Family History  Problem Relation Age of Onset  . Heart disease Father        CHF  . Heart disease Mother   . COPD Sister   . Diabetes Sister   . Neuropathy  Sister   . COPD Sister   . COPD Sister   . Cancer Sister   . Stroke Brother   . Heart disease Brother   . Colon cancer Neg Hx   . Anesthesia problems Neg Hx   . Hypotension Neg Hx   . Malignant hyperthermia Neg Hx   . Pseudochol deficiency Neg Hx   . Colon polyps Neg Hx     New complaints: None today  Social history: Lives  By herself    Review of Systems  Constitutional: Negative for diaphoresis.  Eyes: Negative for pain.  Respiratory: Negative for shortness of breath.   Cardiovascular: Negative for chest pain, palpitations and leg swelling.  Gastrointestinal: Negative for abdominal pain.  Endocrine: Negative for polydipsia.  Musculoskeletal: Positive for back pain and neck pain.  Skin: Negative for rash.  Neurological: Negative for dizziness, weakness and headaches.  Hematological: Does not bruise/bleed easily.  All other systems reviewed and are negative.      Objective:   Physical Exam Vitals and nursing note reviewed.  Constitutional:      General: She is not in acute distress.    Appearance: Normal appearance. She is well-developed.  HENT:     Head: Normocephalic.     Nose: Nose normal.  Eyes:     Pupils: Pupils are equal, round, and reactive to light.  Neck:     Vascular: No carotid bruit or JVD.  Cardiovascular:     Rate and Rhythm: Normal rate and regular rhythm.     Heart sounds: Normal heart sounds.  Pulmonary:     Effort: Pulmonary effort is normal. No respiratory distress.     Breath sounds: Normal breath sounds. No wheezing or rales.  Chest:     Chest wall: No tenderness.  Abdominal:     General: Bowel sounds are normal. There is no distension or abdominal bruit.     Palpations: Abdomen is soft. There is no hepatomegaly, splenomegaly, mass or pulsatile mass.     Tenderness: There is no abdominal tenderness.  Musculoskeletal:        General: Normal range of motion.     Cervical back: Normal range of motion and neck supple.     Comments:  Rises slowly from sitting to standing Gait slow and steady  Lymphadenopathy:     Cervical: No cervical adenopathy.  Skin:    General: Skin is warm and dry.  Neurological:     Mental Status: She is alert and oriented to person, place, and time.     Deep Tendon Reflexes: Reflexes are normal and symmetric.  Psychiatric:        Behavior: Behavior normal.        Thought Content: Thought content normal.        Judgment: Judgment normal.    BP 138/78   Pulse 76   Temp (!) 96.7 F (35.9 C) (Temporal)   Resp 20   Ht 5\' 5"  (1.651 m)   Wt 191 lb (86.6 kg)   SpO2 90%   BMI 31.78 kg/m         Assessment & Plan:  DELECIA VASTINE comes in today with chief complaint of No chief complaint on file.   Diagnosis and orders addressed:  1. Benign hypertension Low sodium diet - lisinopril-hydrochlorothiazide (ZESTORETIC) 10-12.5 MG tablet; Take 1 tablet by mouth daily.  Dispense: 90 tablet; Refill: 1  2. Hyperlipidemia with target LDL less than 100 Low fat diet  3. Hypokalemia Labs pending  4. COPD (chronic obstructive pulmonary disease) with chronic bronchitis ([facility]) - ipratropium (ATROVENT) 0.02 % nebulizer solution; Take 2.5 mLs (0.5 mg total) by nebulization every 6 (six) hours as needed for wheezing or shortness of breath.  Dispense: 75 mL; Refill: 12 - fluticasone furoate-vilanterol (BREO ELLIPTA) 200-25 MCG/INH AEPB; Inhale 1 puff into the lungs daily.  Dispense: 1 each; Refill: 5  5. Gastroesophageal reflux disease without esophagitis Avoid spicy foods Do not eat 2 hours prior to bedtime - pantoprazole (PROTONIX) 40 MG tablet; Take 1 tablet (40 mg total) by mouth 2 (two) times daily.  Dispense: 180 tablet; Refill: 1  6. Chronic diarrhea Continue bentyl as needed  7. Irritable bowel syndrome with diarrhea  8. Anxiety Stress management - ALPRAZolam (XANAX) 0.5 MG tablet; Take 1 tablet (0.5 mg total) by mouth 3 (three) times daily as needed for anxiety.  Dispense: 60  tablet; Refill: 2  9. Recurrent major depressive disorder, in full remission ([place]) - PARoxetine (PAXIL) 20 MG tablet; Take 1 tablet (20 mg total) by mouth daily.  Dispense: 90 tablet; Refill: 1  10. Chronic midline low back pain without sciatica Moist heat Back brace when doing house work - HYDROcodone-acetaminophen (NORCO) 10-325 MG tablet; Take 1 tablet by mouth every 8 (eight) hours as needed.  Dispense: 90 tablet; Refill: 0 - HYDROcodone-acetaminophen (NORCO) 10-325 MG tablet; Take 1 tablet by mouth every 8 (eight) hours as needed.  Dispense: 90 tablet; Refill: 0 - HYDROcodone-acetaminophen (NORCO) 10-325 MG tablet; Take 1 tablet by mouth every 8 (eight) hours as needed.  Dispense: 90 tablet; Refill: 0  11. Smoker Smoking cessation encouraged  12. BMI 32.0-32.9,adult Discussed diet and exercise for person with BMI >25 Will recheck weight in 3-6 months   Labs pending Health Maintenance reviewed Diet and exercise encouraged  Follow up plan: 3 months   [name], FNP

## 2021-01-06 LAB — CBC WITH DIFFERENTIAL/PLATELET
Basophils Absolute: 0.1 10*3/uL (ref 0.0–0.2)
Basos: 2 %
EOS (ABSOLUTE): 0.3 10*3/uL (ref 0.0–0.4)
Eos: 3 %
Hematocrit: 43.8 % (ref 34.0–46.6)
Hemoglobin: 14.4 g/dL (ref 11.1–15.9)
Immature Grans (Abs): 0 10*3/uL (ref 0.0–0.1)
Immature Granulocytes: 0 %
Lymphocytes Absolute: 2.7 10*3/uL (ref 0.7–3.1)
Lymphs: 33 %
MCH: 29.5 pg (ref 26.6–33.0)
MCHC: 32.9 g/dL (ref 31.5–35.7)
MCV: 90 fL (ref 79–97)
Monocytes Absolute: 0.7 10*3/uL (ref 0.1–0.9)
Monocytes: 9 %
Neutrophils Absolute: 4.4 10*3/uL (ref 1.4–7.0)
Neutrophils: 53 %
Platelets: 231 10*3/uL (ref 150–450)
RBC: 4.88 x10E6/uL (ref 3.77–5.28)
RDW: 12.5 % (ref 11.7–15.4)
WBC: 8.2 10*3/uL (ref 3.4–10.8)

## 2021-01-06 LAB — LIPID PANEL
Chol/HDL Ratio: 3.3 ratio (ref 0.0–4.4)
Cholesterol, Total: 207 mg/dL — ABNORMAL HIGH (ref 100–199)
HDL: 63 mg/dL (ref >39)
LDL Chol Calc (NIH): 119 mg/dL — ABNORMAL HIGH (ref 0–99)
Triglycerides: 140 mg/dL (ref 0–149)
VLDL Cholesterol Cal: 25 mg/dL (ref 5–40)

## 2021-01-06 LAB — CMP14+EGFR
ALT: 13 IU/L (ref 0–32)
AST: 12 IU/L (ref 0–40)
Albumin/Globulin Ratio: 2.3 — ABNORMAL HIGH (ref 1.2–2.2)
Albumin: 4.3 g/dL (ref 3.7–4.7)
Alkaline Phosphatase: 100 IU/L (ref 44–121)
BUN/Creatinine Ratio: 6 — ABNORMAL LOW (ref 12–28)
BUN: 3 mg/dL — ABNORMAL LOW (ref 8–27)
Bilirubin Total: 0.3 mg/dL (ref 0.0–1.2)
CO2: 32 mmol/L — ABNORMAL HIGH (ref 20–29)
Calcium: 9.9 mg/dL (ref 8.7–10.3)
Chloride: 93 mmol/L — ABNORMAL LOW (ref 96–106)
Creatinine, Ser: 0.49 mg/dL — ABNORMAL LOW (ref 0.57–1.00)
Globulin, Total: 1.9 g/dL (ref 1.5–4.5)
Glucose: 86 mg/dL (ref 65–99)
Potassium: 4.1 mmol/L (ref 3.5–5.2)
Sodium: 137 mmol/L (ref 134–144)
Total Protein: 6.2 g/dL (ref 6.0–8.5)
eGFR: 99 mL/min/{1.73_m2} (ref >59)

## 2021-01-07 MED ORDER — SIMVASTATIN 20 MG PO TABS: 20.0000 mg | ORAL_TABLET | Freq: Every day | ORAL | 1 refills | Status: DC

## 2021-01-07 NOTE — Addendum Note (Signed)
Addended by: Chevis Pretty on: 01/07/2021 03:02 PM   Modules accepted: Orders

## 2021-01-08 ENCOUNTER — Ambulatory Visit: Payer: Self-pay | Admitting: Nurse Practitioner

## 2021-01-21 DIAGNOSIS — J441 Chronic obstructive pulmonary disease with (acute) exacerbation: Secondary | ICD-10-CM | POA: Diagnosis not present

## 2021-01-29 ENCOUNTER — Other Ambulatory Visit: Payer: Self-pay | Admitting: Nurse Practitioner

## 2021-01-29 DIAGNOSIS — K529 Noninfective gastroenteritis and colitis, unspecified: Secondary | ICD-10-CM

## 2021-01-29 NOTE — Telephone Encounter (Signed)
Last office visit 01/05/21 Last refill 04/07/20, #360, no refills

## 2021-02-10 ENCOUNTER — Telehealth: Payer: Self-pay

## 2021-02-10 NOTE — Telephone Encounter (Signed)
Pt called office to schedule OV, she's aware we are waiting for Dr. Roseanne Kaufman July schedule.  Please schedule OV with Dr. Gala Romney.

## 2021-02-12 NOTE — Telephone Encounter (Signed)
At the moment patient is scheduled to see [facility] in August but she is wanting to see [facility] in July when scheduled is available. I put her on the cancellation list and she is on the July recall.

## 2021-02-20 DIAGNOSIS — J441 Chronic obstructive pulmonary disease with (acute) exacerbation: Secondary | ICD-10-CM | POA: Diagnosis not present

## 2021-03-23 DIAGNOSIS — J441 Chronic obstructive pulmonary disease with (acute) exacerbation: Secondary | ICD-10-CM | POA: Diagnosis not present

## 2021-04-02 ENCOUNTER — Encounter: Payer: Self-pay | Admitting: Nurse Practitioner

## 2021-04-02 ENCOUNTER — Other Ambulatory Visit: Payer: Self-pay

## 2021-04-02 ENCOUNTER — Other Ambulatory Visit: Payer: Self-pay | Admitting: Nurse Practitioner

## 2021-04-02 ENCOUNTER — Ambulatory Visit (INDEPENDENT_AMBULATORY_CARE_PROVIDER_SITE_OTHER): Payer: Medicare Other | Admitting: Nurse Practitioner

## 2021-04-02 ENCOUNTER — Other Ambulatory Visit (INDEPENDENT_AMBULATORY_CARE_PROVIDER_SITE_OTHER): Payer: Medicare Other

## 2021-04-02 VITALS — BP 136/82 | HR 85 | Temp 97.9°F | Resp 20 | Ht 65.0 in | Wt 187.0 lb

## 2021-04-02 DIAGNOSIS — E876 Hypokalemia: Secondary | ICD-10-CM

## 2021-04-02 DIAGNOSIS — B001 Herpesviral vesicular dermatitis: Secondary | ICD-10-CM

## 2021-04-02 DIAGNOSIS — M810 Age-related osteoporosis without current pathological fracture: Secondary | ICD-10-CM | POA: Diagnosis not present

## 2021-04-02 DIAGNOSIS — Z780 Asymptomatic menopausal state: Secondary | ICD-10-CM

## 2021-04-02 DIAGNOSIS — G8929 Other chronic pain: Secondary | ICD-10-CM

## 2021-04-02 DIAGNOSIS — K529 Noninfective gastroenteritis and colitis, unspecified: Secondary | ICD-10-CM

## 2021-04-02 DIAGNOSIS — I10 Essential (primary) hypertension: Secondary | ICD-10-CM

## 2021-04-02 DIAGNOSIS — F3342 Major depressive disorder, recurrent, in full remission: Secondary | ICD-10-CM

## 2021-04-02 DIAGNOSIS — E785 Hyperlipidemia, unspecified: Secondary | ICD-10-CM | POA: Diagnosis not present

## 2021-04-02 DIAGNOSIS — M545 Low back pain, unspecified: Secondary | ICD-10-CM | POA: Diagnosis not present

## 2021-04-02 DIAGNOSIS — I700 Atherosclerosis of aorta: Secondary | ICD-10-CM | POA: Diagnosis not present

## 2021-04-02 DIAGNOSIS — K219 Gastro-esophageal reflux disease without esophagitis: Secondary | ICD-10-CM

## 2021-04-02 DIAGNOSIS — J449 Chronic obstructive pulmonary disease, unspecified: Secondary | ICD-10-CM

## 2021-04-02 DIAGNOSIS — F17200 Nicotine dependence, unspecified, uncomplicated: Secondary | ICD-10-CM

## 2021-04-02 DIAGNOSIS — Z6832 Body mass index (BMI) 32.0-32.9, adult: Secondary | ICD-10-CM

## 2021-04-02 DIAGNOSIS — F419 Anxiety disorder, unspecified: Secondary | ICD-10-CM

## 2021-04-02 DIAGNOSIS — K580 Irritable bowel syndrome with diarrhea: Secondary | ICD-10-CM | POA: Diagnosis not present

## 2021-04-02 DIAGNOSIS — J4489 Other specified chronic obstructive pulmonary disease: Secondary | ICD-10-CM

## 2021-04-02 MED ORDER — ACYCLOVIR 5 % EX CREA: 1.0000 "application " | TOPICAL_CREAM | CUTANEOUS | 1 refills | Status: DC

## 2021-04-02 MED ORDER — HYDROCODONE-ACETAMINOPHEN 10-325 MG PO TABS: 1.0000 | ORAL_TABLET | Freq: Three times a day (TID) | ORAL | 0 refills | Status: DC | PRN

## 2021-04-02 MED ORDER — ALPRAZOLAM 0.5 MG PO TABS: 0.5000 mg | ORAL_TABLET | Freq: Three times a day (TID) | ORAL | 2 refills | Status: DC | PRN

## 2021-04-02 MED ORDER — LISINOPRIL-HYDROCHLOROTHIAZIDE 10-12.5 MG PO TABS: 1.0000 | ORAL_TABLET | Freq: Every day | ORAL | 1 refills | Status: DC

## 2021-04-02 MED ORDER — PANTOPRAZOLE SODIUM 40 MG PO TBEC: 40.0000 mg | DELAYED_RELEASE_TABLET | Freq: Two times a day (BID) | ORAL | 1 refills | Status: DC

## 2021-04-02 MED ORDER — PAROXETINE HCL 20 MG PO TABS: 20.0000 mg | ORAL_TABLET | Freq: Every day | ORAL | 1 refills | Status: DC

## 2021-04-02 MED ORDER — ACYCLOVIR 5 % EX OINT: TOPICAL_OINTMENT | CUTANEOUS | Status: AC

## 2021-04-02 MED ORDER — DICYCLOMINE HCL 10 MG PO CAPS
10.0000 mg | ORAL_CAPSULE | Freq: Four times a day (QID) | ORAL | 0 refills | Status: DC | PRN
Start: 2021-04-02 — End: 2021-06-29

## 2021-04-02 MED ORDER — SIMVASTATIN 20 MG PO TABS: 20.0000 mg | ORAL_TABLET | Freq: Every day | ORAL | 1 refills | Status: DC

## 2021-04-02 NOTE — Progress Notes (Addendum)
Subjective:    Patient ID: [name], female    DOB: Jun 10, 1946, 75 y.o.   MRN: [id number]   Chief Complaint: medical management of chronic issues    HPI:  1. Benign hypertension No c/o chest pain, sob or headache. Does nit check blood pressure at home. BP Readings from Last 3 Encounters:  01/05/21 138/78  10/06/20 (!) 145/84  07/02/20 (!) 146/82     2. Aortic atherosclerosis (HCC) No problems that she is aware of. Has not ever seen cardiology  3. Hyperlipidemia with target LDL less than 100 Doe snot watch diet and does no dedicated exercise. Lab Results  Component Value Date   CHOL 207 (H) 01/05/2021   HDL 63 01/05/2021   LDLCALC 119 (H) 01/05/2021   TRIG 140 01/05/2021   CHOLHDL 3.3 01/05/2021     4. Hypokalemia No c/o lower ext cramping Lab Results  Component Value Date   K 4.1 01/05/2021     5. COPD (chronic obstructive pulmonary disease) with chronic bronchitis ([place]) Is on  BREO but doe snot use it daily and has to use atrovent and xopenex nebs at home. She uses oxygen at night. O2 sat is usually around 91-92. It goes into the 80's when she is laying done. She uses albuterol every day.  6. Smoker Smokes over a pack a day  7. Gastroesophageal reflux disease without esophagitis Is on protonix daily, she says works well for her.  8. Irritable bowel syndrome with diarrhea She uses bentyl when she needs it  9. Anxiety Stays anxious. She is on xanax tid. She was referred to psych at last visit, but when they called to make the appointment she declined appointment. GAD 7 : Generalized Anxiety Score 04/02/2021 01/05/2021 07/02/2020 04/07/2020  Nervous, Anxious, on Edge '1 2 2 3  ' Control/stop worrying '1 2 2 3  ' Worry too much - different things '1 2 2 3  ' Trouble relaxing 1 0 0 3  Restless '1 1 1 ' 0  Easily annoyed or irritable '1 1 1 ' 0  Afraid - awful might happen 1 0 0 0  Total GAD 7 Score '7 8 8 12  ' Anxiety Difficulty Not difficult at all Somewhat  difficult Somewhat difficult Not difficult at all     10. Recurrent major depressive disorder, in full remission ([place]) She has been on paxil for many years. Has no medication side effects. Has refused to change meds in the past despite depression scores. Depression screen [facility] 2/9 04/02/2021 01/05/2021 10/06/2020  Decreased Interest '1 1 1  ' Down, Depressed, Hopeless '1 1 1  ' PHQ - 2 Score '2 2 2  ' Altered sleeping '1 3 3  ' Tired, decreased energy '1 1 1  ' Change in appetite 1 0 0  Feeling bad or failure about yourself  0 0 0  Trouble concentrating 0 0 0  Moving slowly or fidgety/restless 1 0 0  Suicidal thoughts 0 0 0  PHQ-9 Score '6 6 6  ' Difficult doing work/chores Not difficult at all Not difficult at all Not difficult at all  Some recent data might be hidden     11. Chronic midline low back pain without sciatica Pain assessment: Cause of pain- DDD Pain location- lower back Pain on scale of 1-10- 6-7/10 currently Frequency- daily What increases pain-to much activity What makes pain Better-rest Effects on ADL - none Any change in general medical condition-none  Current opioids rx- norcom 10/325 # meds rx- #90- we have tried to wean her down with  no success Effectiveness of current meds-help Adverse reactions from pain meds-none Morphine equivalent- 30 MME  Pill count performed-No Last drug screen - 07/02/20 ( high risk q24m moderate risk q62mlow risk yearly ) Urine drug screen today- No Was the NCLore[place]eviewed- yes  If yes were their any concerning findings? - none   Overdose risk: 1 Pain contract signed on:01/08/21   12. BMI 32.0-32.9,adult Weight is down 4lbs Wt Readings from Last 3 Encounters:  04/02/21 187 lb (84.8 kg)  01/05/21 191 lb (86.6 kg)  10/06/20 191 lb (86.6 kg)   BMI Readings from Last 3 Encounters:  04/02/21 31.12 kg/m  01/05/21 31.78 kg/m  10/06/20 31.78 kg/m      Outpatient Encounter Medications as of 04/02/2021  Medication Sig   albuterol  (VENTOLIN HFA) 108 (90 Base) MCG/ACT inhaler INHALE 2 PUFFS BY MOUTH EVERY 6 HOURS AS NEEDED FOR WHEEZING OR SHORTNESS OF BREATH   ALPRAZolam (XANAX) 0.5 MG tablet Take 1 tablet (0.5 mg total) by mouth 3 (three) times daily as needed for anxiety.   calcium carbonate (OS-CAL) 600 MG TABS Take 600 mg by mouth daily.   Cholecalciferol (VITAMIN D3) 1000 UNITS CAPS Take 3,000 mg by mouth daily.   dicyclomine (BENTYL) 10 MG capsule TAKE 1 CAPSULE (10 MG TOTAL) BY MOUTH 4 (FOUR) TIMES DAILY AS NEEDED FOR SPASMS.   diphenhydrAMINE (BENADRYL) 25 MG tablet Take 25 mg by mouth 2 (two) times daily as needed. For Allergies   fluticasone (FLONASE) 50 MCG/ACT nasal spray SPRAY 2 SPRAYS INTO EACH NOSTRIL EVERY DAY   fluticasone furoate-vilanterol (BREO ELLIPTA) 200-25 MCG/INH AEPB Inhale 1 puff into the lungs daily.   HYDROcodone-acetaminophen (NORCO) 10-325 MG tablet Take 1 tablet by mouth every 8 (eight) hours as needed.   HYDROcodone-acetaminophen (NORCO) 10-325 MG tablet Take 1 tablet by mouth every 8 (eight) hours as needed.   HYDROcodone-acetaminophen (NORCO) 10-325 MG tablet Take 1 tablet by mouth every 8 (eight) hours as needed.   ipratropium (ATROVENT) 0.02 % nebulizer solution Take 2.5 mLs (0.5 mg total) by nebulization every 6 (six) hours as needed for wheezing or shortness of breath.   levalbuterol (XOPENEX) 1.25 MG/0.5ML nebulizer solution Take 1.25 mg by nebulization every 6 (six) hours as needed for wheezing or shortness of breath.   lisinopril-hydrochlorothiazide (ZESTORETIC) 10-12.5 MG tablet Take 1 tablet by mouth daily.   Multiple Vitamin (MULITIVITAMIN WITH MINERALS) TABS Take 1 tablet by mouth daily.   pantoprazole (PROTONIX) 40 MG tablet Take 1 tablet (40 mg total) by mouth 2 (two) times daily.   PARoxetine (PAXIL) 20 MG tablet Take 1 tablet (20 mg total) by mouth daily.   Phenazopyridine HCl (AZO DINE PO) Take 1-2 tablets by mouth.   simvastatin (ZOCOR) 20 MG tablet Take 1 tablet (20 mg  total) by mouth at bedtime.   No facility-administered encounter medications on file as of 04/02/2021.    Past Surgical History:  Procedure Laterality Date   APPENDECTOMY     [age]ears old   BACK SURGERY     X 2   BIOPSY  08/11/2016   Procedure: BIOPSY;  Surgeon: RoDaneil DolinMD;  [facility]: [facility];  Service: Endoscopy;;  gastric bx's   BREAST SURGERY     biopsy only   CARPAL TUNNEL RELEASE     right hand   CERVICAL BIOPSY  W/ LOOP ELECTRODE EXCISION  02/2011   CIN-2 with focal ectocervical margin involvement. With CIN-1 ECC negative   CHOLECYSTECTOMY  1984  ESOPHAGOGASTRODUODENOSCOPY  12/01/2011   Hiatal hernia/Mild fibrotic pyloric stenosis status post dilation with passage of  the scope.  The previously noted gastric ulcer completely healed  The remainder of the gastric mucosa appeared normal   ESOPHAGOGASTRODUODENOSCOPY (EGD) WITH PROPOFOL N/A 08/11/2016   Procedure: ESOPHAGOGASTRODUODENOSCOPY (EGD) WITH PROPOFOL;  Surgeon: Daneil Dolin, MD;  Location: [facility];  Service: Endoscopy;  Laterality: N/A;  2:15 PM   FRACTURE SURGERY  10/18/2016   left knee in 3 places   [place]  07/28/2011   Schatzki's ring s/p 82F with small UES tear as well, small hh, 1cm pyloric channel ulcer, bx benign without H.Pylori. Mobic/goody's at time.   MALONEY DILATION N/A 08/11/2016   Procedure: Venia Minks DILATION;  Surgeon: Daneil Dolin, MD;  Location: [facility];  Service: Endoscopy;  Laterality: N/A;   ORIF PATELLA Left 10/19/2016   Procedure: OPEN REDUCTION INTERNAL (ORIF) FIXATION PATELLA;  Surgeon: Carole Civil, MD;  Location: [facility];  Service: Orthopedics;  Laterality: Left;   SPINE SURGERY  2009   cervical disc   SPINE SURGERY  2009   lumbar   TUBAL LIGATION  1976   tumor removed from arm      Family History  Problem Relation Age of Onset   Heart disease Father        CHF   Heart disease Mother    COPD Sister    Diabetes Sister    Neuropathy Sister     COPD Sister    COPD Sister    Cancer Sister    Stroke Brother    Heart disease Brother    Colon cancer Neg Hx    Anesthesia problems Neg Hx    Hypotension Neg Hx    Malignant hyperthermia Neg Hx    Pseudochol deficiency Neg Hx    Colon polyps Neg Hx     New complaints: Has been getting fever blisters lately. Sh euse to have acyclovir cream which worked well for her.  Social history: Lives by herself  Controlled substance contract: 01/08/21     Review of Systems  Constitutional:  Negative for diaphoresis.  Eyes:  Negative for pain.  Respiratory:  Positive for cough, shortness of breath and wheezing.   Cardiovascular:  Negative for chest pain, palpitations and leg swelling.  Gastrointestinal:  Negative for abdominal pain.  Endocrine: Negative for polydipsia.  Skin:  Negative for rash.  Neurological:  Negative for dizziness, weakness and headaches.  Hematological:  Does not bruise/bleed easily.  All other systems reviewed and are negative.     Objective:   Physical Exam Vitals and nursing note reviewed.  Constitutional:      General: She is not in acute distress.    Appearance: Normal appearance. She is well-developed.  HENT:     Head: Normocephalic.     Right Ear: Tympanic membrane normal.     Left Ear: Tympanic membrane normal.     Nose: Nose normal.     Mouth/Throat:     Mouth: Mucous membranes are moist.     Comments: Vesicular lesion right upper lip Eyes:     Pupils: Pupils are equal, round, and reactive to light.  Neck:     Vascular: No carotid bruit or JVD.  Cardiovascular:     Rate and Rhythm: Normal rate and regular rhythm.     Heart sounds: Normal heart sounds.  Pulmonary:     Effort: Pulmonary effort is normal. No respiratory distress.     Breath sounds:  Wheezing and rhonchi present. No rales.  Chest:     Chest wall: No tenderness.  Abdominal:     General: Bowel sounds are normal. There is no distension or abdominal bruit.     Palpations: Abdomen  is soft. There is no hepatomegaly, splenomegaly, mass or pulsatile mass.     Tenderness: There is no abdominal tenderness.  Musculoskeletal:        General: Normal range of motion.     Cervical back: Normal range of motion and neck supple.  Lymphadenopathy:     Cervical: No cervical adenopathy.  Skin:    General: Skin is warm and dry.  Neurological:     Mental Status: She is alert and oriented to person, place, and time.     Deep Tendon Reflexes: Reflexes are normal and symmetric.  Psychiatric:        Behavior: Behavior normal.        Thought Content: Thought content normal.        Judgment: Judgment normal.    BP 136/82 (BP Location: Left Arm, Cuff Size: Normal)   Pulse 85   Temp 97.9 F (36.6 C) (Temporal)   Resp 20   Ht '5\' 5"'  (1.651 m)   Wt 187 lb (84.8 kg)   SpO2 93%   BMI 31.12 kg/m         Assessment & Plan:  [name] comes in today with chief complaint of Medical Management of Chronic Issues   Diagnosis and orders addressed:  1. Benign hypertension Low sodium diet - lisinopril-hydrochlorothiazide (ZESTORETIC) 10-12.5 MG tablet; Take 1 tablet by mouth daily.  Dispense: 90 tablet; Refill: 1  2. Aortic atherosclerosis ([place]) Refuses to see cardiologist  3. Hyperlipidemia with target LDL less than 100 Low fat diet - simvastatin (ZOCOR) 20 MG tablet; Take 1 tablet (20 mg total) by mouth at bedtime.  Dispense: 90 tablet; Refill: 1  4. Hypokalemia Labs pending  5. COPD (chronic obstructive pulmonary disease) with chronic bronchitis ([place]) Referral to pulmonology - Ambulatory referral to Pulmonology  6. Smoker Smoking cessation encouraged  7. Gastroesophageal reflux disease without esophagitis Avoid spicy foods Do not eat 2 hours prior to bedtime - pantoprazole (PROTONIX) 40 MG tablet; Take 1 tablet (40 mg total) by mouth 2 (two) times daily.  Dispense: 180 tablet; Refill: 1  8. Irritable bowel syndrome with diarrhea Wacth diet to prevent flare  up  9. Anxiety Stress management - ALPRAZolam (XANAX) 0.5 MG tablet; Take 1 tablet (0.5 mg total) by mouth 3 (three) times daily as needed for anxiety.  Dispense: 60 tablet; Refill: 2  10. Recurrent major depressive disorder, in full remission ([place]) - PARoxetine (PAXIL) 20 MG tablet; Take 1 tablet (20 mg total) by mouth daily.  Dispense: 90 tablet; Refill: 1  11. Chronic midline low back pain without sciatica Moist heat rest - HYDROcodone-acetaminophen (NORCO) 10-325 MG tablet; Take 1 tablet by mouth every 8 (eight) hours as needed.  Dispense: 90 tablet; Refill: 0 - HYDROcodone-acetaminophen (NORCO) 10-325 MG tablet; Take 1 tablet by mouth every 8 (eight) hours as needed.  Dispense: 90 tablet; Refill: 0 - HYDROcodone-acetaminophen (NORCO) 10-325 MG tablet; Take 1 tablet by mouth every 8 (eight) hours as needed.  Dispense: 90 tablet; Refill: 0  12. BMI 32.0-32.9,adult Discussed diet and exercise for person with BMI >25 Will recheck weight in 3-6 months  13. Fever blister - acyclovir cream (ZOVIRAX) 5 %; Apply 1 application topically every 3 (three) hours.  Dispense: 15 g; Refill: 1  14. Chronic diarrhea - dicyclomine (BENTYL) 10 MG capsule; Take 1 capsule (10 mg total) by mouth 4 (four) times daily as needed for spasms.  Dispense: 360 capsule; Refill: 0  Orders Placed This Encounter  Procedures   CBC with Differential/Platelet   CMP14+EGFR   Lipid panel   Ambulatory referral to Pulmonology    Referral Priority:   Routine    Referral Type:   Consultation    Referral Reason:   Specialty Services Required    Requested Specialty:   Pulmonary Disease    Number of Visits Requested:   1    Labs pending Health Maintenance reviewed Diet and exercise encouraged  Follow up plan: 3 months   [name], FNP

## 2021-04-02 NOTE — Addendum Note (Signed)
Addended by: Chevis Pretty on: 04/02/2021 12:36 PM   Modules accepted: Orders

## 2021-04-02 NOTE — Patient Instructions (Signed)
Textbook of family medicine (9th ed., pp. 1062-1073). [place], [place]: [employer].">  Stress, Adult Stress is a normal reaction to life events. Stress is what you feel when life demands more than you are used to, or more than you think you can handle. Some stress can be useful, such as studying for a test or meeting a deadline at work. Stress that occurs too often or for too long can cause problems. It can affect your emotional health and interfere with relationships and normal daily activities. Too much stress can weaken your body's defense system (immune system) and increase your risk for physical illness. If you already have a medicalproblem, stress can make it worse. What are the causes? All sorts of life events can cause stress. An event that causes stress for one person may not be stressful for another person. Major life events, whether positive or negative, commonly cause stress. Examples include: Losing a job or starting a new job. Losing a loved one. Moving to a new town or home. Getting married or divorced. Having a baby. Getting injured or sick. Less obvious life events can also cause stress, especially if they occur day after day or in combination with each other. Examples include: Working long hours. Driving in traffic. Caring for children. Being in debt. Being in a difficult relationship. What are the signs or symptoms? Stress can cause emotional symptoms, including: Anxiety. This is feeling worried, afraid, on edge, overwhelmed, or out of control. Anger, including irritation or impatience. Depression. This is feeling sad, down, helpless, or guilty. Trouble focusing, remembering, or making decisions. Stress can cause physical symptoms, including: Aches and pains. These may affect your head, neck, back, stomach, or other areas of your body. Tight muscles or a clenched jaw. Low energy. Trouble sleeping. Stress can cause unhealthy behaviors, including: Eating to feel better  (overeating) or skipping meals. Working too much or putting off tasks. Smoking, drinking alcohol, or using drugs to feel better. How is this diagnosed? Stress is diagnosed through an assessment by your health care provider. He or she may diagnose this condition based on: Your symptoms and any stressful life events. Your medical history. Tests to rule out other causes of your symptoms. Depending on your condition, your health care provider may refer you to aspecialist for further evaluation. How is this treated?  Stress management techniques are the recommended treatment for stress. Medicineis not typically recommended for the treatment of stress. Techniques to reduce your reaction to stressful life events include: Stress identification. Monitor yourself for symptoms of stress and identify what causes stress for you. These skills may help you to avoid or prepare for stressful events. Time management. Set your priorities, keep a calendar of events, and learn to say no. Taking these actions can help you avoid making too many commitments. Techniques for coping with stress include: Rethinking the problem. Try to think realistically about stressful events rather than ignoring them or overreacting. Try to find the positives in a stressful situation rather than focusing on the negatives. Exercise. Physical exercise can release both physical and emotional tension. The key is to find a form of exercise that you enjoy and do it regularly. Relaxation techniques. These relax the body and mind. The key is to find one or more that you enjoy and use the techniques regularly. Examples include: Meditation, deep breathing, or progressive relaxation techniques. Yoga or tai chi. Biofeedback, mindfulness techniques, or journaling. Listening to music, being out in nature, or participating in other hobbies. Practicing a healthy lifestyle.   Eat a balanced diet, drink plenty of water, limit or avoid caffeine, and get  plenty of sleep. Having a strong support network. Spend time with family, friends, or other people you enjoy being around. Express your feelings and talk things over with someone you trust. Counseling or talk therapy with a mental health professional may be helpful if you are havingtrouble managing stress on your own. Follow these instructions at home: Lifestyle  Avoid drugs. Do not use any products that contain nicotine or tobacco, such as cigarettes, e-cigarettes, and chewing tobacco. If you need help quitting, ask your health care provider. Limit alcohol intake to no more than 1 drink a day for nonpregnant women and 2 drinks a day for men. One drink equals 12 oz of beer, 5 oz of wine, or 1 oz of hard liquor Do not use alcohol or drugs to relax. Eat a balanced diet that includes fresh fruits and vegetables, whole grains, lean meats, fish, eggs, and beans, and low-fat dairy. Avoid processed foods and foods high in added fat, sugar, and salt. Exercise at least 30 minutes on 5 or more days each week. Get 7-8 hours of sleep each night.  General instructions  Practice stress management techniques as discussed with your health care provider. Drink enough fluid to keep your urine clear or pale yellow. Take over-the-counter and prescription medicines only as told by your health care provider. Keep all follow-up visits as told by your health care provider. This is important.  Contact a health care provider if: Your symptoms get worse. You have new symptoms. You feel overwhelmed by your problems and can no longer manage them on your own. Get help right away if: You have thoughts of hurting yourself or others. If you ever feel like you may hurt yourself or others, or have thoughts about taking your own life, get help right away. You can go to your nearest emergency department or call: Your local emergency services (911 in the [place] A suicide crisis helpline, such as the [facility] at 4253100524. This is open 24 hours a day. Summary Stress is a normal reaction to life events. It can cause problems if it happens too often or for too long. Practicing stress management techniques is the best way to treat stress. Counseling or talk therapy with a mental health professional may be helpful if you are having trouble managing stress on your own. This information is not intended to replace advice given to you by your health care provider. Make sure you discuss any questions you have with your healthcare provider. Document Revised: 06/05/2020 Document Reviewed: 06/05/2020 Elsevier Patient Education  2022 [employer].

## 2021-04-03 LAB — CMP14+EGFR
ALT: 49 IU/L — ABNORMAL HIGH (ref 0–32)
AST: 58 IU/L — ABNORMAL HIGH (ref 0–40)
Albumin/Globulin Ratio: 2.1 (ref 1.2–2.2)
Albumin: 4.5 g/dL (ref 3.7–4.7)
Alkaline Phosphatase: 228 IU/L — ABNORMAL HIGH (ref 44–121)
BUN/Creatinine Ratio: 8 — ABNORMAL LOW (ref 12–28)
BUN: 5 mg/dL — ABNORMAL LOW (ref 8–27)
Bilirubin Total: 0.4 mg/dL (ref 0.0–1.2)
CO2: 28 mmol/L (ref 20–29)
Calcium: 10.2 mg/dL (ref 8.7–10.3)
Chloride: 100 mmol/L (ref 96–106)
Creatinine, Ser: 0.6 mg/dL (ref 0.57–1.00)
Globulin, Total: 2.1 g/dL (ref 1.5–4.5)
Glucose: 77 mg/dL (ref 65–99)
Potassium: 4.6 mmol/L (ref 3.5–5.2)
Sodium: 144 mmol/L (ref 134–144)
Total Protein: 6.6 g/dL (ref 6.0–8.5)
eGFR: 95 mL/min/{1.73_m2} (ref >59)

## 2021-04-03 LAB — CBC WITH DIFFERENTIAL/PLATELET
Basophils Absolute: 0.1 10*3/uL (ref 0.0–0.2)
Basos: 2 %
EOS (ABSOLUTE): 0.3 10*3/uL (ref 0.0–0.4)
Eos: 4 %
Hematocrit: 45.1 % (ref 34.0–46.6)
Hemoglobin: 14.8 g/dL (ref 11.1–15.9)
Immature Grans (Abs): 0 10*3/uL (ref 0.0–0.1)
Immature Granulocytes: 0 %
Lymphocytes Absolute: 2.1 10*3/uL (ref 0.7–3.1)
Lymphs: 29 %
MCH: 28.8 pg (ref 26.6–33.0)
MCHC: 32.8 g/dL (ref 31.5–35.7)
MCV: 88 fL (ref 79–97)
Monocytes Absolute: 0.6 10*3/uL (ref 0.1–0.9)
Monocytes: 9 %
Neutrophils Absolute: 4.2 10*3/uL (ref 1.4–7.0)
Neutrophils: 56 %
Platelets: 303 10*3/uL (ref 150–450)
RBC: 5.13 x10E6/uL (ref 3.77–5.28)
RDW: 12.8 % (ref 11.7–15.4)
WBC: 7.4 10*3/uL (ref 3.4–10.8)

## 2021-04-03 LAB — LIPID PANEL
Chol/HDL Ratio: 2.6 ratio (ref 0.0–4.4)
Cholesterol, Total: 181 mg/dL (ref 100–199)
HDL: 69 mg/dL (ref >39)
LDL Chol Calc (NIH): 95 mg/dL (ref 0–99)
Triglycerides: 92 mg/dL (ref 0–149)
VLDL Cholesterol Cal: 17 mg/dL (ref 5–40)

## 2021-04-12 ENCOUNTER — Other Ambulatory Visit: Payer: Self-pay

## 2021-04-12 DIAGNOSIS — R7989 Other specified abnormal findings of blood chemistry: Secondary | ICD-10-CM

## 2021-04-12 MED ORDER — AZITHROMYCIN 250 MG PO TABS: ORAL_TABLET | ORAL | 0 refills | Status: AC

## 2021-04-22 DIAGNOSIS — J441 Chronic obstructive pulmonary disease with (acute) exacerbation: Secondary | ICD-10-CM | POA: Diagnosis not present

## 2021-05-23 DIAGNOSIS — J441 Chronic obstructive pulmonary disease with (acute) exacerbation: Secondary | ICD-10-CM | POA: Diagnosis not present

## 2021-05-24 ENCOUNTER — Other Ambulatory Visit: Payer: Medicare Other

## 2021-05-24 ENCOUNTER — Other Ambulatory Visit: Payer: Self-pay

## 2021-05-24 DIAGNOSIS — R7989 Other specified abnormal findings of blood chemistry: Secondary | ICD-10-CM

## 2021-05-25 ENCOUNTER — Ambulatory Visit (INDEPENDENT_AMBULATORY_CARE_PROVIDER_SITE_OTHER): Payer: Medicare Other

## 2021-05-25 VITALS — Ht 65.0 in | Wt 185.0 lb

## 2021-05-25 DIAGNOSIS — Z0000 Encounter for general adult medical examination without abnormal findings: Secondary | ICD-10-CM

## 2021-05-25 LAB — CMP14+EGFR
ALT: 17 IU/L (ref 0–32)
AST: 17 IU/L (ref 0–40)
Albumin/Globulin Ratio: 1.9 (ref 1.2–2.2)
Albumin: 4.2 g/dL (ref 3.7–4.7)
Alkaline Phosphatase: 136 IU/L — ABNORMAL HIGH (ref 44–121)
BUN/Creatinine Ratio: 9 — ABNORMAL LOW (ref 12–28)
BUN: 5 mg/dL — ABNORMAL LOW (ref 8–27)
Bilirubin Total: 0.3 mg/dL (ref 0.0–1.2)
CO2: 28 mmol/L (ref 20–29)
Calcium: 9.8 mg/dL (ref 8.7–10.3)
Chloride: 93 mmol/L — ABNORMAL LOW (ref 96–106)
Creatinine, Ser: 0.54 mg/dL — ABNORMAL LOW (ref 0.57–1.00)
Globulin, Total: 2.2 g/dL (ref 1.5–4.5)
Glucose: 97 mg/dL (ref 65–99)
Potassium: 4.2 mmol/L (ref 3.5–5.2)
Sodium: 135 mmol/L (ref 134–144)
Total Protein: 6.4 g/dL (ref 6.0–8.5)
eGFR: 97 mL/min/{1.73_m2} (ref >59)

## 2021-05-25 NOTE — Patient Instructions (Signed)
[name] , Thank you for taking time to come for your Medicare Wellness Visit. I appreciate your ongoing commitment to your health goals. Please review the following plan we discussed and let me know if I can assist you in the future.   Screening recommendations/referrals: Colonoscopy: Done 05/30/2008 - Repeat in 10 years *keep appointment with Dr Gala Romney to discuss* Mammogram: Done 04/10/2018 - Repeat annually *Make appointment at [facility] in [place] soon* Bone Density: Done 04/02/2021 - /ah2 Recommended yearly ophthalmology/optometry visit for glaucoma screening and checkup Recommended yearly dental visit for hygiene and checkup  Vaccinations: Influenza vaccine: Done 10/08/2020 - /ah1 Pneumococcal vaccine: Done 11/27/2012 & 09/04/2015 Tdap vaccine: Margart Sickles 01/26/2017 - Repeat in 10 years Shingles vaccine: Due. Shingrix discussed. Please contact your pharmacy for coverage information.     Covid-19:Declined  Conditions/risks identified: Continue to practice fall prevention - use a cane when needed; change positions slowly. Drink 6-8 glasses of water daily and eat healthy, lean meats, fruits and vegetables.  Next appointment: Follow up in one year for your annual wellness visit    Preventive Care 65 Years and Older, Female Preventive care refers to lifestyle choices and visits with your health care provider that can promote health and wellness. What does preventive care include? A yearly physical exam. This is also called an annual well check. Dental exams once or twice a year. Routine eye exams. Ask your health care provider how often you should have your eyes checked. Personal lifestyle choices, including: Daily care of your teeth and gums. Regular physical activity. Eating a healthy diet. Avoiding tobacco and drug use. Limiting alcohol use. Practicing safe sex. Taking low-dose aspirin every day. Taking vitamin and mineral supplements as recommended by your health care provider. What  happens during an annual well check? The services and screenings done by your health care provider during your annual well check will depend on your age, overall health, lifestyle risk factors, and family history of disease. Counseling  Your health care provider may ask you questions about your: Alcohol use. Tobacco use. Drug use. Emotional well-being. Home and relationship well-being. Sexual activity. Eating habits. History of falls. Memory and ability to understand (cognition). Work and work Statistician. Reproductive health. Screening  You may have the following tests or measurements: Height, weight, and BMI. Blood pressure. Lipid and cholesterol levels. These may be checked every 5 years, or more frequently if you are over 19 years old. Skin check. Lung cancer screening. You may have this screening every year starting at age 77 if you have a 30-pack-year history of smoking and currently smoke or have quit within the past 15 years. Fecal occult blood test (FOBT) of the stool. You may have this test every year starting at age 24. Flexible sigmoidoscopy or colonoscopy. You may have a sigmoidoscopy every 5 years or a colonoscopy every 10 years starting at age 70. Hepatitis C blood test. Hepatitis B blood test. Sexually transmitted disease (STD) testing. Diabetes screening. This is done by checking your blood sugar (glucose) after you have not eaten for a while (fasting). You may have this done every 1-3 years. Bone density scan. This is done to screen for osteoporosis. You may have this done starting at age 71. Mammogram. This may be done every 1-2 years. Talk to your health care provider about how often you should have regular mammograms. Talk with your health care provider about your test results, treatment options, and if necessary, the need for more tests. Vaccines  Your health care provider  may recommend certain vaccines, such as: Influenza vaccine. This is recommended every  year. Tetanus, diphtheria, and acellular pertussis (Tdap, Td) vaccine. You may need a Td booster every 10 years. Zoster vaccine. You may need this after age 22. Pneumococcal 13-valent conjugate (PCV13) vaccine. One dose is recommended after age 10. Pneumococcal polysaccharide (PPSV23) vaccine. One dose is recommended after age 37. Talk to your health care provider about which screenings and vaccines you need and how often you need them. This information is not intended to replace advice given to you by your health care provider. Make sure you discuss any questions you have with your health care provider. Document Released: 10/16/2015 Document Revised: 06/08/2016 Document Reviewed: 07/21/2015 Elsevier Interactive Patient Education  2017 [employer] Prevention in the Home Falls can cause injuries. They can happen to people of all ages. There are many things you can do to make your home safe and to help prevent falls. What can I do on the outside of my home? Regularly fix the edges of walkways and driveways and fix any cracks. Remove anything that might make you trip as you walk through a door, such as a raised step or threshold. Trim any bushes or trees on the path to your home. Use bright outdoor lighting. Clear any walking paths of anything that might make someone trip, such as rocks or tools. Regularly check to see if handrails are loose or broken. Make sure that both sides of any steps have handrails. Any raised decks and porches should have guardrails on the edges. Have any leaves, snow, or ice cleared regularly. Use sand or salt on walking paths during winter. Clean up any spills in your garage right away. This includes oil or grease spills. What can I do in the bathroom? Use night lights. Install grab bars by the toilet and in the tub and shower. Do not use towel bars as grab bars. Use non-skid mats or decals in the tub or shower. If you need to sit down in the shower, use a  plastic, non-slip stool. Keep the floor dry. Clean up any water that spills on the floor as soon as it happens. Remove soap buildup in the tub or shower regularly. Attach bath mats securely with double-sided non-slip rug tape. Do not have throw rugs and other things on the floor that can make you trip. What can I do in the bedroom? Use night lights. Make sure that you have a light by your bed that is easy to reach. Do not use any sheets or blankets that are too big for your bed. They should not hang down onto the floor. Have a firm chair that has side arms. You can use this for support while you get dressed. Do not have throw rugs and other things on the floor that can make you trip. What can I do in the kitchen? Clean up any spills right away. Avoid walking on wet floors. Keep items that you use a lot in easy-to-reach places. If you need to reach something above you, use a strong step stool that has a grab bar. Keep electrical cords out of the way. Do not use floor polish or wax that makes floors slippery. If you must use wax, use non-skid floor wax. Do not have throw rugs and other things on the floor that can make you trip. What can I do with my stairs? Do not leave any items on the stairs. Make sure that there are handrails on both sides  of the stairs and use them. Fix handrails that are broken or loose. Make sure that handrails are as long as the stairways. Check any carpeting to make sure that it is firmly attached to the stairs. Fix any carpet that is loose or worn. Avoid having throw rugs at the top or bottom of the stairs. If you do have throw rugs, attach them to the floor with carpet tape. Make sure that you have a light switch at the top of the stairs and the bottom of the stairs. If you do not have them, ask someone to add them for you. What else can I do to help prevent falls? Wear shoes that: Do not have high heels. Have rubber bottoms. Are comfortable and fit you  well. Are closed at the toe. Do not wear sandals. If you use a stepladder: Make sure that it is fully opened. Do not climb a closed stepladder. Make sure that both sides of the stepladder are locked into place. Ask someone to hold it for you, if possible. Clearly mark and make sure that you can see: Any grab bars or handrails. First and last steps. Where the edge of each step is. Use tools that help you move around (mobility aids) if they are needed. These include: Canes. Walkers. Scooters. Crutches. Turn on the lights when you go into a dark area. Replace any light bulbs as soon as they burn out. Set up your furniture so you have a clear path. Avoid moving your furniture around. If any of your floors are uneven, fix them. If there are any pets around you, be aware of where they are. Review your medicines with your doctor. Some medicines can make you feel dizzy. This can increase your chance of falling. Ask your doctor what other things that you can do to help prevent falls. This information is not intended to replace advice given to you by your health care provider. Make sure you discuss any questions you have with your health care provider. Document Released: 07/16/2009 Document Revised: 02/25/2016 Document Reviewed: 10/24/2014 Elsevier Interactive Patient Education  2017 [employer].

## 2021-05-25 NOTE — Progress Notes (Signed)
Subjective:   [name] is a 75 y.o. female who presents for Medicare Annual (Subsequent) preventive examination.  Virtual Visit via Telephone Note  I connected with  [name] on 05/25/21 at 11:15 AM EDT by telephone and verified that I am speaking with the correct person using two identifiers.  Location: Patient: Home Provider: [facility] Persons participating in the virtual visit: patient/Nurse Health Advisor   I discussed the limitations, risks, security and privacy concerns of performing an evaluation and management service by telephone and the availability of in person appointments. The patient expressed understanding and agreed to proceed.  Interactive audio and video telecommunications were attempted between this nurse and patient, however failed, due to patient having technical difficulties OR patient did not have access to video capability.  We continued and completed visit with audio only.  Some vital signs may be absent or patient reported.   Sharon Glendenning E Vaneta Hammontree, LPN   Review of Systems     Cardiac Risk Factors include: advanced age (>18mn, >>60women);dyslipidemia;hypertension;obesity (BMI >30kg/m2);sedentary lifestyle;smoking/ tobacco exposure;Other (see comment), Risk factor comments: COPD, Atherosclerosis     Objective:    Today's Vitals   05/25/21 1126  Weight: 185 lb (83.9 kg)  Height: '5\' 5"'$  (1.651 m)  PainSc: 7    Body mass index is 30.79 kg/m.  Advanced Directives 05/24/2020 05/24/2020 07/18/2019 01/31/2018 11/24/2017 11/20/2017 11/19/2017  Does Patient Have a Medical Advance Directive? No No No Yes No No No  Type of Advance Directive - - - Living will - - -  Does patient want to make changes to medical advance directive? - - - No - Patient declined - - -  Would patient like information on creating a medical advance directive? No - Patient declined - No - Patient declined - No - Patient declined No - Patient declined No - Patient declined  Pre-existing  out of facility DNR order (yellow form or pink MOST form) - - - - - - -    Current Medications (verified) Outpatient Encounter Medications as of 05/25/2021  Medication Sig   albuterol (VENTOLIN HFA) 108 (90 Base) MCG/ACT inhaler INHALE 2 PUFFS BY MOUTH EVERY 6 HOURS AS NEEDED FOR WHEEZING OR SHORTNESS OF BREATH   ALPRAZolam (XANAX) 0.5 MG tablet Take 1 tablet (0.5 mg total) by mouth 3 (three) times daily as needed for anxiety.   calcium carbonate (OS-CAL) 600 MG TABS Take 600 mg by mouth daily.   Cholecalciferol (VITAMIN D3) 1000 UNITS CAPS Take 3,000 mg by mouth daily.   dicyclomine (BENTYL) 10 MG capsule Take 1 capsule (10 mg total) by mouth 4 (four) times daily as needed for spasms.   diphenhydrAMINE (BENADRYL) 25 MG tablet Take 25 mg by mouth 2 (two) times daily as needed. For Allergies   fluticasone (FLONASE) 50 MCG/ACT nasal spray SPRAY 2 SPRAYS INTO EACH NOSTRIL EVERY DAY   fluticasone furoate-vilanterol (BREO ELLIPTA) 200-25 MCG/INH AEPB Inhale 1 puff into the lungs daily.   [START ON 06/04/2021] HYDROcodone-acetaminophen (NORCO) 10-325 MG tablet Take 1 tablet by mouth every 8 (eight) hours as needed.   HYDROcodone-acetaminophen (NORCO) 10-325 MG tablet Take 1 tablet by mouth every 8 (eight) hours as needed.   HYDROcodone-acetaminophen (NORCO) 10-325 MG tablet Take 1 tablet by mouth every 8 (eight) hours as needed.   ipratropium (ATROVENT) 0.02 % nebulizer solution Take 2.5 mLs (0.5 mg total) by nebulization every 6 (six) hours as needed for wheezing or shortness of breath.   levalbuterol (XOPENEX) 1.25 MG/0.5ML nebulizer  solution Take 1.25 mg by nebulization every 6 (six) hours as needed for wheezing or shortness of breath.   lisinopril-hydrochlorothiazide (ZESTORETIC) 10-12.5 MG tablet Take 1 tablet by mouth daily.   Multiple Vitamin (MULITIVITAMIN WITH MINERALS) TABS Take 1 tablet by mouth daily.   pantoprazole (PROTONIX) 40 MG tablet Take 1 tablet (40 mg total) by mouth 2 (two) times  daily.   PARoxetine (PAXIL) 20 MG tablet Take 1 tablet (20 mg total) by mouth daily.   Phenazopyridine HCl (AZO DINE PO) Take 1-2 tablets by mouth.   simvastatin (ZOCOR) 20 MG tablet Take 1 tablet (20 mg total) by mouth at bedtime.   Facility-Administered Encounter Medications as of 05/25/2021  Medication   acyclovir ointment (ZOVIRAX) 5 %    Allergies (verified) Meloxicam   History: Past Medical History:  Diagnosis Date   Angina    with indigestion   Anxiety    Arthritis    all over esp. in joints   Complication of anesthesia    COPD (chronic obstructive pulmonary disease) (HCC)    Depression    Family history of adverse reaction to anesthesia    GERD (gastroesophageal reflux disease)    Headache(784.0)    normal h/a   HPV in female    HTN (hypertension)    Hypercholesteremia    Hypercholesterolemia    IBS (irritable bowel syndrome)    Pneumonia 2007   PONV (postoperative nausea and vomiting)    S/P colonoscopy 05/30/2008   Dr. Deatra Ina: (records reviewed by AS, NP) findings normal to cecum, scattered diverticula in sigmoid colon    S/P endoscopy 05/15/2008   Dr. Deatra Ina: (records reviewed by AS,NP): [facility] dilator for stricture distal esophagus, also noted duodenal stricture, op notes state unable to pass 36m scope through pylorus   Shortness of breath    with exertion   Stomach ulcer    Past Surgical History:  Procedure Laterality Date   APPENDECTOMY     75years old   BACK SURGERY     X 2   BIOPSY  08/11/2016   Procedure: BIOPSY;  Surgeon: RDaneil Dolin MD;  [facility];  Service: Endoscopy;;  gastric bx's   BREAST SURGERY     biopsy only   CARPAL TUNNEL RELEASE     right hand   CERVICAL BIOPSY  W/ LOOP ELECTRODE EXCISION  02/2011   CIN-2 with focal ectocervical margin involvement. With CIN-1 ECC negative   CHOLECYSTECTOMY  1984   ESOPHAGOGASTRODUODENOSCOPY  12/01/2011   Hiatal hernia/Mild fibrotic pyloric stenosis status post dilation with  passage of  the scope.  The previously noted gastric ulcer completely healed  The remainder of the gastric mucosa appeared normal   ESOPHAGOGASTRODUODENOSCOPY (EGD) WITH PROPOFOL N/A 08/11/2016   Procedure: ESOPHAGOGASTRODUODENOSCOPY (EGD) WITH PROPOFOL;  Surgeon: RDaneil Dolin MD;  [facility];  Service: Endoscopy;  Laterality: N/A;  2:15 PM   FRACTURE SURGERY  10/18/2016   left knee in 3 places   [place] 07/28/2011   Schatzki's ring s/p 32F with small UES tear as well, small hh, 1cm pyloric channel ulcer, bx benign without H.Pylori. Mobic/goody's at time.   MALONEY DILATION N/A 08/11/2016   Procedure: MVenia MinksDILATION;  Surgeon: RDaneil Dolin MD;  [facility];  Service: Endoscopy;  Laterality: N/A;   ORIF PATELLA Left 10/19/2016   Procedure: OPEN REDUCTION INTERNAL (ORIF) FIXATION PATELLA;  Surgeon: SCarole Civil MD;  Location: [facility];  Service: Orthopedics;  Laterality: Left;  SPINE SURGERY  2009   cervical disc   SPINE SURGERY  2009   lumbar   TUBAL LIGATION  1976   tumor removed from arm     Family History  Problem Relation Age of Onset   Heart disease Father        CHF   Heart disease Mother    COPD Sister    Diabetes Sister    Neuropathy Sister    COPD Sister    COPD Sister    Cancer Sister    Stroke Brother    Heart disease Brother    Colon cancer Neg Hx    Anesthesia problems Neg Hx    Hypotension Neg Hx    Malignant hyperthermia Neg Hx    Pseudochol deficiency Neg Hx    Colon polyps Neg Hx    Social History   Socioeconomic History   Marital status: Widowed    Spouse name: Not on file   Number of children: 5   Years of education: 9   Highest education level: 9th grade  Occupational History   Occupation: disability    Employer: DIS[employer]    Employer: [employer]  Tobacco Use   Smoking status: Every Day    Packs/day: 1.00    Years: 50.00    Pack years: 50.00    Types: Cigarettes   Smokeless tobacco: Never    Tobacco comments:    since teenager  Vaping Use   Vaping Use: Never used  Substance and Sexual Activity   Alcohol use: No    Comment: hx of ETOH abuse in past, none in 10 years   Drug use: No   Sexual activity: Not Currently    Birth control/protection: Surgical  Other Topics Concern   Not on file  Social History Narrative   Husband passed away Apr 26, 2015 from lung cancer/leukemia.    Social Determinants of Health   Financial Resource Strain: Low Risk    Difficulty of Paying Living Expenses: Not hard at all  Food Insecurity: No Food Insecurity   Worried About Charity fundraiser in the Last Year: Never true   [place] in the Last Year: Never true  Transportation Needs: No Transportation Needs   Lack of Transportation (Medical): No   Lack of Transportation (Non-Medical): No  Physical Activity: Inactive   Days of Exercise per Week: 0 days   Minutes of Exercise per Session: 0 min  Stress: Stress Concern Present   Feeling of Stress : Rather much  Social Connections: Moderately Isolated   Frequency of Communication with Friends and Family: More than three times a week   Frequency of Social Gatherings with Friends and Family: More than three times a week   Attends Religious Services: More than 4 times per year   Active Member of [employer] or Organizations: No   Attends Archivist Meetings: Never   Marital Status: Widowed    Tobacco Counseling Ready to quit: No Counseling given: Yes Tobacco comments: since teenager   Clinical Intake:  Pre-visit preparation completed: Yes  Pain : 0-10 Pain Score: 7  Pain Type: Chronic pain Pain Location: Back Pain Descriptors / Indicators: Aching, Sharp, Sore, Discomfort Pain Onset: More than a month ago Pain Frequency: Intermittent     BMI - recorded: 30.79 Nutritional Status: BMI > 30  Obese Nutritional Risks: Nausea/ vomitting/ diarrhea (just today - maybe stomach bug) Diabetes: No  How often do you need to  have someone help you when  you read instructions, pamphlets, or other written materials from your doctor or pharmacy?: 1 - Never  Diabetic? No  Interpreter Needed?: No  Information entered by :: Mylee Falin, LPN   Activities of Daily Living In your present state of health, do you have any difficulty performing the following activities: 05/25/2021  Hearing? Y  Comment mild  Vision? N  Difficulty concentrating or making decisions? Y  Walking or climbing stairs? Y  Dressing or bathing? N  Doing errands, shopping? N  Preparing Food and eating ? N  Using the Toilet? N  In the past six months, have you accidently leaked urine? Y  Comment wears depends for protection  Do you have problems with loss of bowel control? Y  Comment wears depends  Managing your Medications? N  Managing your Finances? N  Housekeeping or managing your Housekeeping? Y  Comment she can do very little due to back pain  Some recent data might be hidden    Patient Care Team: Chevis Pretty, FNP as PCP - General (Nurse Practitioner) Daneil Dolin, MD (Gastroenterology) Ilean China, RN as Registered Nurse  Indicate any recent Medical Services you may have received from other than [facility] providers in the past year (date may be approximate).     Assessment:   This is a routine wellness examination for Byron.  Hearing/Vision screen Hearing Screening - Comments:: C/o mild hearing difficulties - declines hearing aids Vision Screening - Comments:: Wears glasses - up to date with annual eye exams with Dr Marin Comment in [place]  Dietary issues and exercise activities discussed: Current Exercise Habits: The patient does not participate in regular exercise at present, Exercise limited by: orthopedic condition(s);respiratory conditions(s);psychological condition(s)   Goals Addressed             This Visit's Progress    Increase physical activity   On track      Depression Screen PHQ 2/9 Scores  05/25/2021 04/02/2021 01/05/2021 10/06/2020 07/02/2020 06/11/2020 04/07/2020  PHQ - 2 Score '2 2 2 2 '$ 0 0 0  PHQ- 9 Score '6 6 6 6 '$ - - -  Exception Documentation - - - - - - -    Fall Risk Fall Risk  05/25/2021 04/02/2021 01/05/2021 10/06/2020 07/02/2020  Falls in the past year? 0 0 0 0 0  Number falls in past yr: 0 - - - 0  Injury with Fall? 0 - - - 0  Comment - - - - -  Risk for fall due to : Orthopedic patient;Impaired vision;Medication side effect - - - No Fall Risks  Follow up Education provided;Falls prevention discussed - - - Falls evaluation completed  Comment - - - - -    FALL RISK PREVENTION PERTAINING TO THE HOME:  Any stairs in or around the home? No  If so, are there any without handrails? No  Home free of loose throw rugs in walkways, pet beds, electrical cords, etc? Yes  Adequate lighting in your home to reduce risk of falls? Yes   ASSISTIVE DEVICES UTILIZED TO PREVENT FALLS:  Life alert? No  Use of a cane, walker or w/c? No  Grab bars in the bathroom? Yes  Shower chair or bench in shower? Yes  Elevated toilet seat or a handicapped toilet? Yes   TIMED UP AND GO:  Was the test performed? No . Telephonic visit.  Cognitive Function: MMSE - Mini Mental State Exam 01/31/2018 01/26/2017  Orientation to time 5 5  Orientation to Place 5 5  Registration  3 3  Attention/ Calculation 5 0  Recall 3 1  Language- name 2 objects 2 2  Language- repeat 1 1  Language- follow 3 step command 3 3  Language- read & follow direction 1 1  Write a sentence 1 1  Copy design 1 1  Total score 30 23     6CIT Screen 05/25/2021 07/18/2019  What Year? 0 points 4 points  What month? 0 points 0 points  What time? 0 points 0 points  Count back from 20 0 points 0 points  Months in reverse 2 points 2 points  Repeat phrase 2 points 0 points  Total Score 4 6    Immunizations Immunization History  Administered Date(s) Administered   Fluad Quad(high Dose 65+) 10/08/2020   Influenza Split 11/27/2012    Influenza, High Dose Seasonal PF 07/01/2014, 08/12/2015, 07/12/2017, 07/12/2018   Influenza,inj,Quad PF,6+ Mos 06/24/2013, 10/10/2019   Influenza-Unspecified 07/29/2009, 07/22/2010, 09/04/2015   Pneumococcal Conjugate-13 06/24/2014, 09/04/2015   Pneumococcal Polysaccharide-23 11/27/2012, 08/12/2015   Tdap 01/26/2017    TDAP status: Up to date  Flu Vaccine status: Up to date  Pneumococcal vaccine status: Up to date  Covid-19 vaccine status: Declined, Education has been provided regarding the importance of this vaccine but patient still declined. Advised may receive this vaccine at local pharmacy or Health Dept.or vaccine clinic. Aware to provide a copy of the vaccination record if obtained from local pharmacy or Health Dept. Verbalized acceptance and understanding.  Qualifies for Shingles Vaccine? Yes   Zostavax completed No   Shingrix Completed?: No.    Education has been provided regarding the importance of this vaccine. Patient has been advised to call insurance company to determine out of pocket expense if they have not yet received this vaccine. Advised may also receive vaccine at local pharmacy or Health Dept. Verbalized acceptance and understanding.  Screening Tests Health Maintenance  Topic Date Due   COVID-19 Vaccine (1) Never done   Zoster Vaccines- Shingrix (1 of 2) Never done   COLONOSCOPY (Pts 45-54yr Insurance coverage will need to be confirmed)  05/30/2018   INFLUENZA VACCINE  05/03/2021   MAMMOGRAM  10/06/2021 (Originally 04/10/2020)   TETANUS/TDAP  01/27/2027   DEXA SCAN  Completed   Hepatitis C Screening  Completed   PNA vac Low Risk Adult  Completed   HPV VACCINES  Aged Out    Health Maintenance  Health Maintenance Due  Topic Date Due   COVID-19 Vaccine (1) Never done   Zoster Vaccines- Shingrix (1 of 2) Never done   COLONOSCOPY (Pts 45-477yrInsurance coverage will need to be confirmed)  05/30/2018   INFLUENZA VACCINE  05/03/2021    Colorectal cancer  screening: Referral to GI placed 04/2021. Pt aware the office will call re: appt. She has an appt later this month with Dr RoGala RomneyMammogram status: Ordered 7/202. Pt provided with contact info and advised to call to schedule appt.  Says she will [facility] and [facility] [facility] Completed 04/02/2021. Results reflect: Bone density results: OSTEOPOROSIS. Repeat every 2 years.  Lung Cancer Screening: (Low Dose CT Chest recommended if Age 74-80ears, 30 pack-year currently smoking OR have quit w/in 15years.) does qualify.   Lung Cancer Screening Referral:  seeing Pulmonologist this week - wants to wait  Additional Screening:  Hepatitis C Screening: does qualify; Completed 07/11/2019  Vision Screening: Recommended annual ophthalmology exams for early detection of glaucoma and other disorders of the eye. Is the patient up  to date with their annual eye exam?  Yes  Who is the provider or what is the name of the office in which the patient attends annual eye exams? Dr Jacquiline Doe If pt is not established with a provider, would they like to be referred to a provider to establish care? No .   Dental Screening: Recommended annual dental exams for proper oral hygiene  Community Resource Referral / Chronic Care Management: CRR required this visit?  No   CCM required this visit?  No      Plan:     I have personally reviewed and noted the following in the patient's chart:   Medical and social history Use of alcohol, tobacco or illicit drugs  Current medications and supplements including opioid prescriptions.  Functional ability and status Nutritional status Physical activity Advanced directives List of other physicians Hospitalizations, surgeries, and ER visits in previous 12 months Vitals Screenings to include cognitive, depression, and falls Referrals and appointments  In addition, I have reviewed and discussed with patient certain preventive protocols, quality  metrics, and best practice recommendations. A written personalized care plan for preventive services as well as general preventive health recommendations were provided to patient.     Sandrea Hammond, LPN   [id number]   Nurse Notes: Having increased anxiety and pain - She is worried either her pain medicine or nerve medicine will get taken away due to a letter from her insurance, but she doesn't feel like she can make it without either.

## 2021-05-31 ENCOUNTER — Other Ambulatory Visit: Payer: Self-pay

## 2021-05-31 ENCOUNTER — Ambulatory Visit (HOSPITAL_COMMUNITY)
Admission: RE | Admit: 2021-05-31 | Discharge: 2021-05-31 | Disposition: A | Discharge status: Home / Self Care | Payer: Medicare Other | Source: Ambulatory Visit | Attending: Internal Medicine | Admitting: Internal Medicine

## 2021-05-31 ENCOUNTER — Ambulatory Visit: Payer: Medicare Other | Admitting: Internal Medicine

## 2021-05-31 ENCOUNTER — Encounter: Payer: Self-pay | Admitting: Internal Medicine

## 2021-05-31 DIAGNOSIS — J449 Chronic obstructive pulmonary disease, unspecified: Secondary | ICD-10-CM

## 2021-05-31 DIAGNOSIS — I10 Essential (primary) hypertension: Secondary | ICD-10-CM | POA: Diagnosis not present

## 2021-05-31 DIAGNOSIS — F17210 Nicotine dependence, cigarettes, uncomplicated: Secondary | ICD-10-CM

## 2021-05-31 DIAGNOSIS — J9811 Atelectasis: Secondary | ICD-10-CM | POA: Diagnosis not present

## 2021-05-31 DIAGNOSIS — R0602 Shortness of breath: Secondary | ICD-10-CM | POA: Diagnosis not present

## 2021-05-31 MED ORDER — BUDESONIDE-FORMOTEROL FUMARATE 80-4.5 MCG/ACT IN AERO
INHALATION_SPRAY | RESPIRATORY_TRACT | 12 refills | Status: DC
Start: 2021-05-31 — End: 2021-06-29

## 2021-05-31 MED ORDER — VALSARTAN-HYDROCHLOROTHIAZIDE 160-12.5 MG PO TABS: 1.0000 | ORAL_TABLET | Freq: Every day | ORAL | 11 refills | Status: DC

## 2021-05-31 NOTE — Assessment & Plan Note (Addendum)
Active smoker -  05/31/2021  After extensive coaching inhaler device,  effectiveness =    75% > change to symbicort 80 2bid and d/c acei  - 05/31/2021   Walked on RA  x  3  lap(s) =  approx 450 @ moderate pace, stopped due to end of study with min sob  with lowest 02 sats 90%    DDX of  difficult airways management almost all start with A and  include Adherence, Ace Inhibitors, Acid Reflux, Active Sinus Disease, Alpha 1 Antitripsin deficiency, Anxiety masquerading as Airways dz,  ABPA,  Allergy(esp in young), Aspiration (esp in elderly), Adverse effects of meds,  Active smoking or vaping, A bunch of PE's (a small clot burden can't cause this syndrome unless there is already severe underlying pulm or vascular dz with poor reserve) plus two Bs  = Bronchiectasis and Beta blocker use..and one C= CHF   Adherence is always the initial "prime suspect" and is a multilayered concern that requires a "trust but verify" approach in every patient - starting with knowing how to use medications, especially inhalers, correctly, keeping up with refills and understanding the fundamental difference between maintenance and prns vs those medications only taken for a very short course and then stopped and not refilled.    - see hfa teaching - return with all meds in hand using a trust but verify approach to confirm accurate Medication  Reconciliation The principal here is that until we are certain that the  patients are doing what we've asked, it makes no sense to ask them to do more.    Active smoking top of the list > see sep a/p  ACEi adverse effects at the  top of the usual list of suspects and the only way to rule it out is a trial off > see a/p    ? Adverse drug effects > try off breo and on low dose ICS for now pending return with pfts  ? Anxiety/depression > usually at the bottom of this list of usual suspects but may interfere with adherence and also interpretation of response or lack thereof to symptom management  which can be quite subjective, as well as efforts to stop smoking   ? Allergy > nothing to suggest so low dose ICS should suffice

## 2021-05-31 NOTE — Assessment & Plan Note (Signed)
Counseled re importance of smoking cessation but did not meet time criteria for separate billing          Each maintenance medication was reviewed in detail including emphasizing most importantly the difference between maintenance and prns and under what circumstances the prns are to be triggered using an action plan format where appropriate.  Total time for H and P, chart review, counseling, reviewing hfa device(s) , directly observing portions of ambulatory 02 saturation study/ and generating customized AVS unique to this office visit / same day charting = 46 min

## 2021-05-31 NOTE — Assessment & Plan Note (Signed)
Try off acei 05/31/2021 due to pseuowheeze/ freq severe acute bronchitis   In the best review of chronic cough to date ( NEJM 2016 375 [id number]) ,  ACEi are now felt to cause cough in up to  20% of pts which is a 4 fold increase from previous reports and does not include the variety of non-specific complaints we see in pulmonary clinic in pts on ACEi but previously attributed to another dx like  Copd/asthma and  include PNDS, throat and chest congestion, "bronchitis", unexplained dyspnea and noct "strangling" sensations, and hoarseness, but also  atypical /refractory GERD symptoms like dysphagia, sore throat and "bad heartburn"   The only way I know  to prove this is not an "ACEi Case" is a trial off ACEi x a minimum of 6 weeks then regroup  Try diovan 160-12.5 daily and return for pfts .

## 2021-05-31 NOTE — Progress Notes (Signed)
[name], female    DOB: Nov 11, 1945,    MRN: [id number]   Brief patient profile:  54 yowf active smoker with tendency to bronchitis referred to pulmonary clinic in [place]  05/31/2021 by Roe Rutherford      History of Present Illness  05/31/2021  Pulmonary/ 1st office eval/ Mayer[facility] / [facility]  Chief Complaint  Patient presents with   Pulmonary Consult    Referred by Dr. Chevis Pretty.  Pt states dx with COPD approx 10 years ago. She states her breathing has been worse over the past year. She states that she gets winded walking to her mailbox. She has had CP off and on over the past 3 days- starts in center of her chest and moves up to the left. She has occ cough, mainly non prod. She uses her rescue inhaler about once per day.    Dyspnea:  onset 2020 but getting worse still able to grocery shopping at [employer] Cough: each am x 1 hour > clear mucus only  Sleep: bed is flat/ one pillow  SABA use: uses 1-2 x day / not using neb in 2 days but not the BREO  02:  has it, not using   No obvious day to day or daytime variability or assoc  purulent sputum or mucus plugs or hemoptysis or cp or chest tightness, subjective wheeze or overt sinus or hb symptoms.   Sleeping  without nocturnal  or early am exacerbation  of respiratory  c/o's or need for noct saba. Also denies any obvious fluctuation of symptoms with weather or environmental changes or other aggravating or alleviating factors except as outlined above   No unusual exposure hx or h/o childhood pna/ asthma or knowledge of premature birth.  Current Allergies, Complete Past Medical History, Past Surgical History, Family History, and Social History were reviewed in [employer] record.  ROS  The following are not active complaints unless bolded Hoarseness, sore throat, dysphagia, dental problems, itching, sneezing,  nasal congestion or discharge of excess mucus or purulent secretions, ear ache,    fever, chills, sweats, unintended wt loss or wt gain, classically pleuritic or exertional cp,  orthopnea pnd or arm/hand swelling  or leg swelling, presyncope, palpitations, abdominal pain, anorexia, nausea, vomiting, diarrhea  or change in bowel habits or change in bladder habits, change in stools or change in urine, dysuria, hematuria,  rash, arthralgias, visual complaints, headache, numbness, weakness or ataxia or problems with walking or coordination,  change in mood or  memory.           Past Medical History:  Diagnosis Date   Angina    with indigestion   Anxiety    Arthritis    all over esp. in joints   Complication of anesthesia    COPD (chronic obstructive pulmonary disease) (HCC)    Depression    Family history of adverse reaction to anesthesia    GERD (gastroesophageal reflux disease)    Headache(784.0)    normal h/a   HPV in female    HTN (hypertension)    Hypercholesteremia    Hypercholesterolemia    IBS (irritable bowel syndrome)    Pneumonia 2007   PONV (postoperative nausea and vomiting)    S/P colonoscopy 05/30/2008   Dr. Deatra Ina: (records reviewed by AS, NP) findings normal to cecum, scattered diverticula in sigmoid colon    S/P endoscopy 05/15/2008   Dr. Deatra Ina: (records reviewed by AS,NP): [facility] dilator for stricture distal esophagus, also  noted duodenal stricture, op notes state unable to pass 39m scope through pylorus   Shortness of breath    with exertion   Stomach ulcer     Outpatient Medications Prior to Visit  Medication Sig Dispense Refill   albuterol (VENTOLIN HFA) 108 (90 Base) MCG/ACT inhaler INHALE 2 PUFFS BY MOUTH EVERY 6 HOURS AS NEEDED FOR WHEEZING OR SHORTNESS OF BREATH 8.5 each 5   dicyclomine (BENTYL) 10 MG capsule Take 1 capsule (10 mg total) by mouth 4 (four) times daily as needed for spasms. 360 capsule 0   diphenhydrAMINE (BENADRYL) 25 MG tablet Take 25 mg by mouth 2 (two) times daily as needed. For Allergies     fluticasone (FLONASE) 50  MCG/ACT nasal spray SPRAY 2 SPRAYS INTO EACH NOSTRIL EVERY DAY 48 mL 1   HYDROcodone-acetaminophen (NORCO) 10-325 MG tablet Take 1 tablet by mouth every 8 (eight) hours as needed. 90 tablet 0   ipratropium (ATROVENT) 0.02 % nebulizer solution Take 2.5 mLs (0.5 mg total) by nebulization every 6 (six) hours as needed for wheezing or shortness of breath. 75 mL 12   lisinopril-hydrochlorothiazide (ZESTORETIC) 10-12.5 MG tablet Take 1 tablet by mouth daily. 90 tablet 1   Multiple Vitamin (MULITIVITAMIN WITH MINERALS) TABS Take 1 tablet by mouth daily.     pantoprazole (PROTONIX) 40 MG tablet Take 1 tablet (40 mg total) by mouth 2 (two) times daily. 180 tablet 1   PARoxetine (PAXIL) 20 MG tablet Take 1 tablet (20 mg total) by mouth daily. 90 tablet 1   Phenazopyridine HCl (AZO DINE PO) Take 1-2 tablets by mouth.     ALPRAZolam (XANAX) 0.5 MG tablet Take 1 tablet (0.5 mg total) by mouth 3 (three) times daily as needed for anxiety. (Patient not taking: Reported on 05/31/2021) 60 tablet 2   calcium carbonate (OS-CAL) 600 MG TABS Take 600 mg by mouth daily. (Patient not taking: Reported on 05/31/2021)     Cholecalciferol (VITAMIN D3) 1000 UNITS CAPS Take 3,000 mg by mouth daily. (Patient not taking: Reported on 05/31/2021)     [START ON 06/04/2021] HYDROcodone-acetaminophen (NORCO) 10-325 MG tablet Take 1 tablet by mouth every 8 (eight) hours as needed. (Patient not taking: Reported on 05/31/2021) 90 tablet 0   HYDROcodone-acetaminophen (NORCO) 10-325 MG tablet Take 1 tablet by mouth every 8 (eight) hours as needed. 90 tablet 0   fluticasone furoate-vilanterol (BREO ELLIPTA) 200-25 MCG/INH AEPB   Not taking x sev weeks prior to OV       levalbuterol (XOPENEX) 1.25 MG/0.5ML nebulizer solution Take 1.25 mg by nebulization every 6 (six) hours as needed for wheezing or shortness of breath. 1 each 12   simvastatin (ZOCOR) 20 MG tablet Take 1 tablet (20 mg total) by mouth at bedtime. 90 tablet 1   Facility-Administered  Medications Prior to Visit  Medication Dose Route Frequency Provider Last Rate Last Admin   acyclovir ointment (ZOVIRAX) 5 %   Topical Q3H Martin, Mary-Margaret, FNP         Objective:     BP 124/70 (BP Location: Left Arm, Cuff Size: Normal)   Pulse 72   Temp 98.6 F (37 C) (Oral)   Ht '5\' 5"'$  (1.651 m)   Wt 190 lb (86.2 kg)   SpO2 94% Comment: on RA  BMI 31.62 kg/m   SpO2: 94 % (on RA)  Pleasant amb wf nad  HEENT : pt wearing mask not removed for exam due to covid - 19 concerns.   NECK :  without  JVD/Nodes/TM/ nl carotid upstrokes bilaterally   LUNGS: no acc muscle use,  Min barrel  contour chest wall with bilateral  slightly decreased bs s audible wheeze and  without cough on insp or exp maneuvers and min  Hyperresonant  to  percussion bilaterally     CV:  RRR  no s3 or murmur or increase in P2, and no edema   ABD:  soft and nontender with pos end  insp Hoover's  in the supine position. No bruits or organomegaly appreciated, bowel sounds nl  MS:   Nl gait/  ext warm without deformities, calf tenderness, cyanosis or clubbing No obvious joint restrictions   SKIN: warm and dry without lesions    NEURO:  alert, approp, nl sensorium with  no motor or cerebellar deficits apparent.       CXR PA and Lateral:   05/31/2021 :    I personally reviewed images and agree with radiology impression as follows:    Minimal left lung atelectasis.  No other focal abnormality is noted.       Assessment   COPD GOLD ? / active smoker  Active smoker -  05/31/2021  After extensive coaching inhaler device,  effectiveness =    75% > change to symbicort 80 2bid and d/c acei  - 05/31/2021   Walked on RA  x  3  lap(s) =  approx 450 @ moderate pace, stopped due to end of study with min sob  with lowest 02 sats 90%    DDX of  difficult airways management almost all start with A and  include Adherence, Ace Inhibitors, Acid Reflux, Active Sinus Disease, Alpha 1 Antitripsin deficiency, Anxiety  masquerading as Airways dz,  ABPA,  Allergy(esp in young), Aspiration (esp in elderly), Adverse effects of meds,  Active smoking or vaping, A bunch of PE's (a small clot burden can't cause this syndrome unless there is already severe underlying pulm or vascular dz with poor reserve) plus two Bs  = Bronchiectasis and Beta blocker use..and one C= CHF   Adherence is always the initial "prime suspect" and is a multilayered concern that requires a "trust but verify" approach in every patient - starting with knowing how to use medications, especially inhalers, correctly, keeping up with refills and understanding the fundamental difference between maintenance and prns vs those medications only taken for a very short course and then stopped and not refilled.    - see hfa teaching - return with all meds in hand using a trust but verify approach to confirm accurate Medication  Reconciliation The principal here is that until we are certain that the  patients are doing what we've asked, it makes no sense to ask them to do more.    Active smoking top of the list > see sep a/p  ACEi adverse effects at the  top of the usual list of suspects and the only way to rule it out is a trial off > see a/p    ? Adverse drug effects > try off breo and on low dose ICS for now pending return with pfts  ? Anxiety/depression > usually at the bottom of this list of usual suspects but may interfere with adherence and also interpretation of response or lack thereof to symptom management which can be quite subjective, as well as efforts to stop smoking   ? Allergy > nothing to suggest so low dose ICS should suffice  Benign hypertension Try off acei 05/31/2021 due to pseuowheeze/ freq severe acute  bronchitis   In the best review of chronic cough to date ( NEJM 2016 375 [id number]) ,  ACEi are now felt to cause cough in up to  20% of pts which is a 4 fold increase from previous reports and does not include the variety of non-specific  complaints we see in pulmonary clinic in pts on ACEi but previously attributed to another dx like  Copd/asthma and  include PNDS, throat and chest congestion, "bronchitis", unexplained dyspnea and noct "strangling" sensations, and hoarseness, but also  atypical /refractory GERD symptoms like dysphagia, sore throat and "bad heartburn"   The only way I know  to prove this is not an "ACEi Case" is a trial off ACEi x a minimum of 6 weeks then regroup  Try diovan 160-12.5 daily and return for pfts .   Cigarette smoker Counseled re importance of smoking cessation but did not meet time criteria for separate billing          Each maintenance medication was reviewed in detail including emphasizing most importantly the difference between maintenance and prns and under what circumstances the prns are to be triggered using an action plan format where appropriate.  Total time for H and P, chart review, counseling, reviewing hfa device(s) , directly observing portions of ambulatory 02 saturation study/ and generating customized AVS unique to this office visit / same day charting = 46 min                        Christinia Gully, MD 05/31/2021

## 2021-05-31 NOTE — Patient Instructions (Addendum)
Plan A = Automatic = Always=   Symbicort 80 Take 2 puffs first thing in am and then another 2 puffs about 12 hours later.    Work on inhaler technique:  relax and gently blow all the way out then take a nice smooth full deep breath back in, triggering the inhaler at same time you start breathing in.  Hold for up to 5 seconds if you can. Blow out thru nose. Rinse and gargle with water when done.  If mouth or throat bother you at all,  try brushing teeth/gums/tongue with arm and hammer toothpaste/ make a slurry and gargle and spit out.      Plan B = Backup (to supplement plan A, not to replace it) Only use your albuterol inhaler as a rescue medication to be used if you can't catch your breath by resting or doing a relaxed purse lip breathing pattern.  - The less you use it, the better it will work when you need it. - Ok to use the inhaler up to 2 puffs  every 4 hours if you must but call for appointment if use goes up over your usual need - Don't leave home without it !!  (think of it like the spare tire for your car)   Plan C = Crisis (instead of Plan B but only if Plan B stops working) - only use your albuterol nebulizer if you first try Plan B and it fails to help > ok to use the nebulizer up to every 4 hours but if start needing it regularly call for immediate appointment   Stop the lisinopril   Start valsartan 160-12.5 in its place    Please remember to go to the  x-ray department  @  [facility] for your tests - we will call you with the results when they are available      Please schedule a follow up office visit in 6-8 weeks, call sooner if needed

## 2021-06-01 ENCOUNTER — Encounter: Payer: Self-pay | Admitting: Internal Medicine

## 2021-06-02 ENCOUNTER — Telehealth: Payer: Self-pay

## 2021-06-02 ENCOUNTER — Ambulatory Visit: Payer: Medicare Other | Admitting: Gastroenterology

## 2021-06-02 ENCOUNTER — Encounter: Payer: Self-pay | Admitting: Gastroenterology

## 2021-06-02 ENCOUNTER — Other Ambulatory Visit: Payer: Self-pay

## 2021-06-02 VITALS — BP 167/85 | HR 84 | Temp 97.5°F | Ht 65.0 in | Wt 189.6 lb

## 2021-06-02 DIAGNOSIS — K219 Gastro-esophageal reflux disease without esophagitis: Secondary | ICD-10-CM

## 2021-06-02 DIAGNOSIS — R1013 Epigastric pain: Secondary | ICD-10-CM | POA: Insufficient documentation

## 2021-06-02 DIAGNOSIS — R1310 Dysphagia, unspecified: Secondary | ICD-10-CM

## 2021-06-02 DIAGNOSIS — K580 Irritable bowel syndrome with diarrhea: Secondary | ICD-10-CM

## 2021-06-02 MED ORDER — CLENPIQ 10-3.5-12 MG-GM -GM/160ML PO SOLN: 1.0000 | Freq: Once | ORAL | 0 refills | Status: AC

## 2021-06-02 NOTE — Telephone Encounter (Signed)
PA for TCS/EGD/DIL submitted via [facility] website. PA# [id number], valid 07/01/21-09/29/21.

## 2021-06-02 NOTE — Patient Instructions (Addendum)
Recommend avoiding all aspirin powders and NSAIDs (ibuprofen, naproxen).  These types of medications can cause ulcers. Continue pantoprazole 40 mg twice daily. Continue dicyclomine 10 mg up to 4 times daily as needed for diarrhea. For headaches, recommend discussing with PCP, considering work-up. From a GI perspective/liver perspective, you could utilize Tylenol (acetaminophen) up to 2 g daily. Consider 500-650 mg at one time not to exceed 2 grams per day.  Colonoscopy and upper endoscopy scheduled.  Please see separate instructions.

## 2021-06-02 NOTE — Progress Notes (Signed)
Primary Care Physician:  Chevis Pretty, FNP  Primary Gastroenterologist:  Garfield Cornea, MD   Chief Complaint  Patient presents with   Dysphagia    Solids/liquids, worse with coffee   Diarrhea    Few times a week   Nausea    No vomiting, worse at night    HPI:  [name] is a 75 y.o. female here for further evaluation of dysphagia.  She has a history of chronic diarrhea managed as IBS-D, dysphagia requiring multiple dilations with history of fibrotic pyloric stenosis and gastric ulcers.  History of asa powder use.  EGD November 2017: -Mild Schatzki ring. Dilated -Small hiatal hernia.  -Mild pyloric stenosis dilated with passage of scope -Pyloric channel ulcer -gastric erosions s/p biopsy. Reactive gastropathy, no H. pylori.    Colonoscopy 2009: -Scattered diverticula in the sigmoid colon  Today: Complains of dysphagia, warm coffee is the worst.  Doesn't want to go down. Feels like sticking in throat.  Complains of epigastric burning, nausea which is worse at night.  No vomiting.  Able to eat.  Continues to have postprandial stools, worse if she eats larger meals.  Does not like eating out.  Often does consume small snacks.  Takes Bentyl at least 3 times per day.  May have 1 stool a day or up to 3 or 4.  No melena or rectal bleeding.   Goody's powders for headaches, up to 1-2 per day. "I can't take tylenol because of my liver." She was advised by PCP to avoid tylenol due to elevated LFTs earlier this year. Taking some ibuprofen.  History of intermittent elevated LFTs.  Patient states that she has been advised to avoid Tylenol because of this.  Also was taken off of her statin.  In October 2020 her AST was up to 116, ALT 39.  Statin held and repeat LFTs 01/2020 were normal. July 2022: alk phos 228, AST 58, ALT 49.  Held zocor and repeat labs on August 22 with alk phos of 136, AST/ALT both 17.  Previous viral markers normal for hepatitis A, B and C.  No recent liver  imaging.   Current Outpatient Medications  Medication Sig Dispense Refill   albuterol (VENTOLIN HFA) 108 (90 Base) MCG/ACT inhaler INHALE 2 PUFFS BY MOUTH EVERY 6 HOURS AS NEEDED FOR WHEEZING OR SHORTNESS OF BREATH 8.5 each 5   budesonide-formoterol (SYMBICORT) 80-4.5 MCG/ACT inhaler Take 2 puffs first thing in am and then another 2 puffs about 12 hours later. 1 each 12   dicyclomine (BENTYL) 10 MG capsule Take 1 capsule (10 mg total) by mouth 4 (four) times daily as needed for spasms. 360 capsule 0   diphenhydrAMINE (BENADRYL) 25 MG tablet Take 25 mg by mouth 2 (two) times daily as needed. For Allergies     fluticasone (FLONASE) 50 MCG/ACT nasal spray SPRAY 2 SPRAYS INTO EACH NOSTRIL EVERY DAY 48 mL 1   HYDROcodone-acetaminophen (NORCO) 10-325 MG tablet Take 1 tablet by mouth every 8 (eight) hours as needed. 90 tablet 0   ipratropium (ATROVENT) 0.02 % nebulizer solution Take 2.5 mLs (0.5 mg total) by nebulization every 6 (six) hours as needed for wheezing or shortness of breath. 75 mL 12   Multiple Vitamin (MULITIVITAMIN WITH MINERALS) TABS Take 1 tablet by mouth daily.     pantoprazole (PROTONIX) 40 MG tablet Take 1 tablet (40 mg total) by mouth 2 (two) times daily. 180 tablet 1   PARoxetine (PAXIL) 20 MG tablet Take 1 tablet (20 mg total)  by mouth daily. 90 tablet 1   Phenazopyridine HCl (AZO DINE PO) Take 1-2 tablets by mouth.     valsartan-hydrochlorothiazide (DIOVAN HCT) 160-12.5 MG tablet Take 1 tablet by mouth daily. 30 tablet 11   Current Facility-Administered Medications  Medication Dose Route Frequency Provider Last Rate Last Admin   acyclovir ointment (ZOVIRAX) 5 %   Topical Q3H Hassell Done, Mary-Margaret, FNP        Allergies as of 06/02/2021 - Review Complete 06/02/2021  Allergen Reaction Noted   Meloxicam Other (See Comments) 11/15/2011    Past Medical History:  Diagnosis Date   Angina    with indigestion   Anxiety    Arthritis    all over esp. in joints   Complication of  anesthesia    COPD (chronic obstructive pulmonary disease) (HCC)    Depression    Family history of adverse reaction to anesthesia    GERD (gastroesophageal reflux disease)    Headache(784.0)    normal h/a   HPV in female    HTN (hypertension)    Hypercholesteremia    Hypercholesterolemia    IBS (irritable bowel syndrome)    Pneumonia 2007   PONV (postoperative nausea and vomiting)    S/P colonoscopy 05/30/2008   Dr. Deatra Ina: (records reviewed by AS, NP) findings normal to cecum, scattered diverticula in sigmoid colon    S/P endoscopy 05/15/2008   Dr. Deatra Ina: (records reviewed by AS,NP): [facility] dilator for stricture distal esophagus, also noted duodenal stricture, op notes state unable to pass 27mm scope through pylorus   Shortness of breath    with exertion   Stomach ulcer     Past Surgical History:  Procedure Laterality Date   APPENDECTOMY     75 years old   BACK SURGERY     X 2   BIOPSY  08/11/2016   Procedure: BIOPSY;  Surgeon: Daneil Dolin, MD;  Location: [facility];  Service: Endoscopy;;  gastric bx's   BREAST SURGERY     biopsy only   CARPAL TUNNEL RELEASE     right hand   CERVICAL BIOPSY  W/ LOOP ELECTRODE EXCISION  02/2011   CIN-2 with focal ectocervical margin involvement. With CIN-1 ECC negative   CHOLECYSTECTOMY  1984   ESOPHAGOGASTRODUODENOSCOPY  12/01/2011   Hiatal hernia/Mild fibrotic pyloric stenosis status post dilation with passage of  the scope.  The previously noted gastric ulcer completely healed  The remainder of the gastric mucosa appeared normal   ESOPHAGOGASTRODUODENOSCOPY (EGD) WITH PROPOFOL N/A 08/11/2016   Procedure: ESOPHAGOGASTRODUODENOSCOPY (EGD) WITH PROPOFOL;  Surgeon: Daneil Dolin, MD;  Location: [facility];  Service: Endoscopy;  Laterality: N/A;  2:15 PM   FRACTURE SURGERY  10/18/2016   left knee in 3 places   [place]  07/28/2011   Schatzki's ring s/p 60F with small UES tear as well, small hh, 1cm pyloric channel ulcer, bx  benign without H.Pylori. Mobic/goody's at time.   MALONEY DILATION N/A 08/11/2016   Procedure: Venia Minks DILATION;  Surgeon: Daneil Dolin, MD;  Location: [facility];  Service: Endoscopy;  Laterality: N/A;   ORIF PATELLA Left 10/19/2016   Procedure: OPEN REDUCTION INTERNAL (ORIF) FIXATION PATELLA;  Surgeon: Carole Civil, MD;  Location: [facility];  Service: Orthopedics;  Laterality: Left;   SPINE SURGERY  2009   cervical disc   SPINE SURGERY  2009   lumbar   TUBAL LIGATION  1976   tumor removed from arm      Family History  Problem Relation Age of Onset   Heart disease Father        CHF   Heart disease Mother    COPD Sister    Diabetes Sister    Neuropathy Sister    COPD Sister    COPD Sister    Cancer Sister    Stroke Brother    Heart disease Brother    Colon cancer Neg Hx    Anesthesia problems Neg Hx    Hypotension Neg Hx    Malignant hyperthermia Neg Hx    Pseudochol deficiency Neg Hx    Colon polyps Neg Hx     Social History   Socioeconomic History   Marital status: Widowed    Spouse name: Not on file   Number of children: 5   Years of education: 9   Highest education level: 9th grade  Occupational History   Occupation: disability    Employer: [employer]    Employer: [employer]  Tobacco Use   Smoking status: Every Day    Packs/day: 1.25    Years: 58.00    Pack years: 72.50    Types: Cigarettes   Smokeless tobacco: Never   Tobacco comments:    1/2 ppd 05/31/21   Vaping Use   Vaping Use: Never used  Substance and Sexual Activity   Alcohol use: No    Comment: hx of ETOH abuse in past, none in 10 years   Drug use: No   Sexual activity: Not Currently    Birth control/protection: Surgical  Other Topics Concern   Not on file  Social History Narrative   Husband passed away 04-27-2015 from lung cancer/leukemia.    Social Determinants of Health   Financial Resource Strain: Low Risk    Difficulty of Paying Living Expenses: Not hard at all   Food Insecurity: No Food Insecurity   Worried About Charity fundraiser in the Last Year: Never true   [place] in the Last Year: Never true  Transportation Needs: No Transportation Needs   Lack of Transportation (Medical): No   Lack of Transportation (Non-Medical): No  Physical Activity: Inactive   Days of Exercise per Week: 0 days   Minutes of Exercise per Session: 0 min  Stress: Stress Concern Present   Feeling of Stress : Rather much  Social Connections: Moderately Isolated   Frequency of Communication with Friends and Family: More than three times a week   Frequency of Social Gatherings with Friends and Family: More than three times a week   Attends Religious Services: More than 4 times per year   Active Member of [employer] or Organizations: No   Attends Archivist Meetings: Never   Marital Status: Widowed  Human resources officer Violence: Not At Risk   Fear of Current or Ex-Partner: No   Emotionally Abused: No   Physically Abused: No   Sexually Abused: No      ROS:  General: Negative for anorexia, weight loss, fever, chills, fatigue, weakness. Eyes: Negative for vision changes.  ENT: Negative for hoarseness, difficulty swallowing , nasal congestion. CV: Negative for chest pain, angina, palpitations. +dyspnea on exertion. No peripheral edema.  Respiratory: Negative for dyspnea at rest, +dyspnea on exertion, +cough, no sputum, nowheezing.  GI: See history of present illness. GU:  Negative for dysuria, hematuria, urinary incontinence, urinary frequency, nocturnal urination.  MS: Negative for joint pain, low back pain.  Derm: Negative for rash or itching.  Neuro: Negative for weakness, abnormal sensation, seizure,  frequent headaches, memory loss, confusion.  Psych: Negative for anxiety, depression, suicidal ideation, hallucinations.  Endo: Negative for unusual weight change.  Heme: Negative for bruising or bleeding. Allergy: Negative for rash or hives.     Physical Examination:  BP (!) 167/85   Pulse 84   Temp (!) 97.5 F (36.4 C)   Ht $R'5\' 5"'Dj$  (1.651 m)   Wt 189 lb 9.6 oz (86 kg)   BMI 31.55 kg/m    General: Well-nourished, well-developed in no acute distress.  Head: Normocephalic, atraumatic.   Eyes: Conjunctiva pink, no icterus. Mouth: masked. Neck: Supple without thyromegaly, masses, or lymphadenopathy.  Lungs: scattered rhonchi   Heart: Regular rate and rhythm, no murmurs rubs or gallops.  Abdomen: Bowel sounds are normal, nontender, nondistended, no hepatosplenomegaly or masses, no abdominal bruits or    hernia , no rebound or guarding.   Rectal: not performed Extremities: No lower extremity edema. No clubbing or deformities.  Neuro: Alert and oriented x 4 , grossly normal neurologically.  Skin: Warm and dry, no rash or jaundice.   Psych: Alert and cooperative, normal mood and affect.  Labs: Lab Results  Component Value Date   WBC 7.4 04/02/2021   HGB 14.8 04/02/2021   HCT 45.1 04/02/2021   MCV 88 04/02/2021   PLT 303 04/02/2021   Lab Results  Component Value Date   CREATININE 0.54 (L) 05/24/2021   BUN 5 (L) 05/24/2021   NA 135 05/24/2021   K 4.2 05/24/2021   CL 93 (L) 05/24/2021   CO2 28 05/24/2021   Lab Results  Component Value Date   ALT 17 05/24/2021   AST 17 05/24/2021   ALKPHOS 136 (H) 05/24/2021   BILITOT 0.3 05/24/2021     Imaging Studies: DG Chest 2 View  Result Date: 06/01/2021 CLINICAL DATA:  Shortness of breath and COPD EXAM: CHEST - 2 VIEW COMPARISON:  05/25/2020 FINDINGS: Mild cardiac enlargement is noted stable from the prior study. Aortic calcifications are seen. Lungs are well aerated with minimal platelike atelectasis in the left mid lung. No sizable infiltrate or effusion is seen. Postsurgical changes in the cervical spine are noted. IMPRESSION: Minimal left lung atelectasis.  No other focal abnormality is noted. Electronically Signed   By: Inez Catalina M.D.   On: 06/01/2021 08:41      Assessment:  Dysphagia: To solids and liquids.  History of Schatzki ring requiring dilation in the past.  Last EGD in 2017.  Recommend EGD to evaluate for esophageal web/ring/stricture.  IBS-D: For the most part appears to be well controlled with dicyclomine.  Continue dicyclomine 10 mg up to 4 times daily as needed for diarrhea.  Epigastric pain/nausea: History of pyloric stenosis and pyloric channel ulcer at time of 2017 EGD.  Continues to use aspirin powders on a regular basis.  Would be concerned for recurrent ulcer disease plus or minus pyloric stenosis.  Recommend EGD.  Encouraged her to avoid aspirin powders, NSAIDs.  She can use Tylenol up to 2 g daily, consider max dose of 650 mg.  She has been hesitant due to history of abnormal LFTs but this appears to be statin related in the past.  Most recent LFTs normal.  Screening colonoscopy: Overdue for 10-year screening colonoscopy   Plan:  Colonoscopy/EGD/ED with Dr. Gala Romney with propofol.  ASA III.  I have discussed the risks, alternatives, benefits with regards to but not limited to the risk of reaction to medication, bleeding, infection, perforation and the patient is agreeable to proceed.  Written consent to be obtained. Recommend avoiding all aspirin powders and NSAIDs. From a GI/liver perspective, she can utilize acetaminophen up to 2 g daily, maximum dose of 650 mg. Recommend she address headaches with her PCP. Continue dicyclomine 10 mg up to 4 times daily as needed for diarrhea. Continue pantoprazole 40 mg twice daily.

## 2021-06-04 ENCOUNTER — Telehealth: Payer: Self-pay | Admitting: Internal Medicine

## 2021-06-04 NOTE — Telephone Encounter (Signed)
Called and spoke with patient to let her know of CXR results from Dr. Melvyn Novas. She expressed understanding. Nothing further needed at this time.

## 2021-06-04 NOTE — Telephone Encounter (Signed)
Sorry, had to compare to priors but her cxr is ok:  she has minimal scarring in bases, no need for further imaging at this point

## 2021-06-04 NOTE — Telephone Encounter (Signed)
Patient calling about chest x-ray results.    Dr. Melvyn Novas Please advise.

## 2021-06-23 DIAGNOSIS — J441 Chronic obstructive pulmonary disease with (acute) exacerbation: Secondary | ICD-10-CM | POA: Diagnosis not present

## 2021-06-25 NOTE — Patient Instructions (Signed)
[name]  06/25/2021     @PREFPERIOPPHARMACY @   Your procedure is scheduled on 07/01/2021.   Report to [facility] at 1130 A.M.   Call this number if you have problems the morning of surgery:  [phone]   Remember:  Follow the diet and prep instructions given to you by the office.     Use your nebulizer and your inhalers before you come and bring your rescue inhaler with you.    Take these medicines the morning of surgery with A SIP OF WATER        Xanax(if needed), benadryl, hydrocodone (if needed), protonix, paxil.     Do not wear jewelry, make-up or nail polish.  Do not wear lotions, powders, or perfumes, or deodorant.  Do not shave 48 hours prior to surgery.  Men may shave face and neck.  Do not bring valuables to the hospital.  [facility] is not responsible for any belongings or valuables.  Contacts, dentures or bridgework may not be worn into surgery.  Leave your suitcase in the car.  After surgery it may be brought to your room.  For patients admitted to the hospital, discharge time will be determined by your treatment team.  Patients discharged the day of surgery will not be allowed to drive home and must have someone with them for 24 hours.    Special instructions:   DO NOT smoke tobacco or vape for 24 hours before your procedure.  Please read over the following fact sheets that you were given. Anesthesia Post-op Instructions and Care and Recovery After Surgery      Upper Endoscopy, Adult, Care After This sheet gives you information about how to care for yourself after your procedure. Your health care provider may also give you more specific instructions. If you have problems or questions, contact your health care provider. What can I expect after the procedure? After the procedure, it is common to have: A sore throat. Mild stomach pain or discomfort. Bloating. Nausea. Follow these instructions at home:  Follow instructions  from your health care provider about what to eat or drink after your procedure. Return to your normal activities as told by your health care provider. Ask your health care provider what activities are safe for you. Take over-the-counter and prescription medicines only as told by your health care provider. If you were given a sedative during the procedure, it can affect you for several hours. Do not drive or operate machinery until your health care provider says that it is safe. Keep all follow-up visits as told by your health care provider. This is important. Contact a health care provider if you have: A sore throat that lasts longer than one day. Trouble swallowing. Get help right away if: You vomit blood or your vomit looks like coffee grounds. You have: A fever. Bloody, black, or tarry stools. A severe sore throat or you cannot swallow. Difficulty breathing. Severe pain in your chest or abdomen. Summary After the procedure, it is common to have a sore throat, mild stomach discomfort, bloating, and nausea. If you were given a sedative during the procedure, it can affect you for several hours. Do not drive or operate machinery until your health care provider says that it is safe. Follow instructions from your health care provider about what to eat or drink after your procedure. Return to your normal activities as told by your health care provider. This information is not  intended to replace advice given to you by your health care provider. Make sure you discuss any questions you have with your health care provider. Document Revised: 09/17/2019 Document Reviewed: 02/19/2018 Elsevier Patient Education  2022 [place]. Esophageal Dilatation Esophageal dilatation, also called esophageal dilation, is a procedure to widen or open a blocked or narrowed part of the esophagus. The esophagus is the part of the body that moves food and liquid from the mouth to the stomach. You may need this  procedure if: You have a buildup of scar tissue in your esophagus that makes it difficult, painful, or impossible to swallow. This can be caused by gastroesophageal reflux disease (GERD). You have cancer of the esophagus. There is a problem with how food moves through your esophagus. In some cases, you may need this procedure repeated at a later time to dilate the esophagus gradually. Tell a health care provider about: Any allergies you have. All medicines you are taking, including vitamins, herbs, eye drops, creams, and over-the-counter medicines. Any problems you or family members have had with anesthetic medicines. Any blood disorders you have. Any surgeries you have had. Any medical conditions you have. Any antibiotic medicines you are required to take before dental procedures. Whether you are pregnant or may be pregnant. What are the risks? Generally, this is a safe procedure. However, problems may occur, including: Bleeding due to a tear in the lining of the esophagus. A hole, or perforation, in the esophagus. What happens before the procedure? Ask your health care provider about: Changing or stopping your regular medicines. This is especially important if you are taking diabetes medicines or blood thinners. Taking medicines such as aspirin and ibuprofen. These medicines can thin your blood. Do not take these medicines unless your health care provider tells you to take them. Taking over-the-counter medicines, vitamins, herbs, and supplements. Follow instructions from your health care provider about eating or drinking restrictions. Plan to have a responsible adult take you home from the hospital or clinic. Plan to have a responsible adult care for you for the time you are told after you leave the hospital or clinic. This is important. What happens during the procedure? You may be given a medicine to help you relax (sedative). A numbing medicine may be sprayed into the back of your  throat, or you may gargle the medicine. Your health care provider may perform the dilatation using various surgical instruments, such as: Simple dilators. This instrument is carefully placed in the esophagus to stretch it. Guided wire bougies. This involves using an endoscope to insert a wire into the esophagus. A dilator is passed over this wire to enlarge the esophagus. Then the wire is removed. Balloon dilators. An endoscope with a small balloon is inserted into the esophagus. The balloon is inflated to stretch the esophagus and open it up. The procedure may vary among health care providers and hospitals. What can I expect after the procedure? Your blood pressure, heart rate, breathing rate, and blood oxygen level will be monitored until you leave the hospital or clinic. Your throat may feel slightly sore and numb. This will get better over time. You will not be allowed to eat or drink until your throat is no longer numb. When you are able to drink, urinate, and sit on the edge of the bed without nausea or dizziness, you may be able to return home. Follow these instructions at home: Take over-the-counter and prescription medicines only as told by your health care provider. If you  were given a sedative during the procedure, it can affect you for several hours. Do not drive or operate machinery until your health care provider says that it is safe. Plan to have a responsible adult care for you for the time you are told. This is important. Follow instructions from your health care provider about any eating or drinking restrictions. Do not use any products that contain nicotine or tobacco, such as cigarettes, e-cigarettes, and chewing tobacco. If you need help quitting, ask your health care provider. Keep all follow-up visits. This is important. Contact a health care provider if: You have a fever. You have pain that is not relieved by medicine. Get help right away if: You have chest pain. You  have trouble breathing. You have trouble swallowing. You vomit blood. You have black, tarry, or bloody stools. These symptoms may represent a serious problem that is an emergency. Do not wait to see if the symptoms will go away. Get medical help right away. Call your local emergency services (911 in the [place] Do not drive yourself to the hospital. Summary Esophageal dilatation, also called esophageal dilation, is a procedure to widen or open a blocked or narrowed part of the esophagus. Plan to have a responsible adult take you home from the hospital or clinic. For this procedure, a numbing medicine may be sprayed into the back of your throat, or you may gargle the medicine. Do not drive or operate machinery until your health care provider says that it is safe. This information is not intended to replace advice given to you by your health care provider. Make sure you discuss any questions you have with your health care provider. Document Revised: 02/05/2020 Document Reviewed: 02/05/2020 Elsevier Patient Education  [place]. Colonoscopy, Adult, Care After This sheet gives you information about how to care for yourself after your procedure. Your health care provider may also give you more specific instructions. If you have problems or questions, contact your health care provider. What can I expect after the procedure? After the procedure, it is common to have: A small amount of blood in your stool for 24 hours after the procedure. Some gas. Mild cramping or bloating of your abdomen. Follow these instructions at home: Eating and drinking  Drink enough fluid to keep your urine pale yellow. Follow instructions from your health care provider about eating or drinking restrictions. Resume your normal diet as instructed by your health care provider. Avoid heavy or fried foods that are hard to digest. Activity Rest as told by your health care provider. Avoid sitting for a long time  without moving. Get up to take short walks every 1-2 hours. This is important to improve blood flow and breathing. Ask for help if you feel weak or unsteady. Return to your normal activities as told by your health care provider. Ask your health care provider what activities are safe for you. Managing cramping and bloating  Try walking around when you have cramps or feel bloated. Apply heat to your abdomen as told by your health care provider. Use the heat source that your health care provider recommends, such as a moist heat pack or a heating pad. Place a towel between your skin and the heat source. Leave the heat on for 20-30 minutes. Remove the heat if your skin turns bright red. This is especially important if you are unable to feel pain, heat, or cold. You may have a greater risk of getting burned. General instructions If you were given a  sedative during the procedure, it can affect you for several hours. Do not drive or operate machinery until your health care provider says that it is safe. For the first 24 hours after the procedure: Do not sign important documents. Do not drink alcohol. Do your regular daily activities at a slower pace than normal. Eat soft foods that are easy to digest. Take over-the-counter and prescription medicines only as told by your health care provider. Keep all follow-up visits as told by your health care provider. This is important. Contact a health care provider if: You have blood in your stool 2-3 days after the procedure. Get help right away if you have: More than a small spotting of blood in your stool. Large blood clots in your stool. Swelling of your abdomen. Nausea or vomiting. A fever. Increasing pain in your abdomen that is not relieved with medicine. Summary After the procedure, it is common to have a small amount of blood in your stool. You may also have mild cramping and bloating of your abdomen. If you were given a sedative during the  procedure, it can affect you for several hours. Do not drive or operate machinery until your health care provider says that it is safe. Get help right away if you have a lot of blood in your stool, nausea or vomiting, a fever, or increased pain in your abdomen. This information is not intended to replace advice given to you by your health care provider. Make sure you discuss any questions you have with your health care provider. Document Revised: 09/13/2019 Document Reviewed: 04/15/2019 Elsevier Patient Education  [place] After This sheet gives you information about how to care for yourself after your procedure. Your health care provider may also give you more specific instructions. If you have problems or questions, contact your health care provider. What can I expect after the procedure? After the procedure, it is common to have: Tiredness. Forgetfulness about what happened after the procedure. Impaired judgment for important decisions. Nausea or vomiting. Some difficulty with balance. Follow these instructions at home: For the time period you were told by your health care provider:   Rest as needed. Do not participate in activities where you could fall or become injured. Do not drive or use machinery. Do not drink alcohol. Do not take sleeping pills or medicines that cause drowsiness. Do not make important decisions or sign legal documents. Do not take care of children on your own. Eating and drinking Follow the diet that is recommended by your health care provider. Drink enough fluid to keep your urine pale yellow. If you vomit: Drink water, juice, or soup when you can drink without vomiting. Make sure you have little or no nausea before eating solid foods. General instructions Have a responsible adult stay with you for the time you are told. It is important to have someone help care for you until you are awake and alert. Take  over-the-counter and prescription medicines only as told by your health care provider. If you have sleep apnea, surgery and certain medicines can increase your risk for breathing problems. Follow instructions from your health care provider about wearing your sleep device: Anytime you are sleeping, including during daytime naps. While taking prescription pain medicines, sleeping medicines, or medicines that make you drowsy. Avoid smoking. Keep all follow-up visits as told by your health care provider. This is important. Contact a health care provider if: You keep feeling nauseous or you keep vomiting. You  feel light-headed. You are still sleepy or having trouble with balance after 24 hours. You develop a rash. You have a fever. You have redness or swelling around the IV site. Get help right away if: You have trouble breathing. You have new-onset confusion at home. Summary For several hours after your procedure, you may feel tired. You may also be forgetful and have poor judgment. Have a responsible adult stay with you for the time you are told. It is important to have someone help care for you until you are awake and alert. Rest as told. Do not drive or operate machinery. Do not drink alcohol or take sleeping pills. Get help right away if you have trouble breathing, or if you suddenly become confused. This information is not intended to replace advice given to you by your health care provider. Make sure you discuss any questions you have with your health care provider. Document Revised: 06/04/2020 Document Reviewed: 08/22/2019 Elsevier Patient Education  2022 [employer].

## 2021-06-29 ENCOUNTER — Ambulatory Visit (INDEPENDENT_AMBULATORY_CARE_PROVIDER_SITE_OTHER): Payer: Medicare Other | Admitting: Nurse Practitioner

## 2021-06-29 ENCOUNTER — Other Ambulatory Visit: Payer: Self-pay

## 2021-06-29 ENCOUNTER — Encounter (HOSPITAL_COMMUNITY)
Admission: RE | Admit: 2021-06-29 | Discharge: 2021-06-29 | Disposition: A | Discharge status: Home / Self Care | Payer: Medicare Other | Source: Ambulatory Visit | Attending: Internal Medicine | Admitting: Internal Medicine

## 2021-06-29 ENCOUNTER — Encounter (HOSPITAL_COMMUNITY): Payer: Self-pay

## 2021-06-29 ENCOUNTER — Encounter: Payer: Self-pay | Admitting: Nurse Practitioner

## 2021-06-29 VITALS — BP 159/86 | HR 79 | Temp 97.3°F | Ht 65.0 in | Wt 192.8 lb

## 2021-06-29 DIAGNOSIS — K219 Gastro-esophageal reflux disease without esophagitis: Secondary | ICD-10-CM | POA: Diagnosis not present

## 2021-06-29 DIAGNOSIS — I10 Essential (primary) hypertension: Secondary | ICD-10-CM

## 2021-06-29 DIAGNOSIS — F17210 Nicotine dependence, cigarettes, uncomplicated: Secondary | ICD-10-CM | POA: Diagnosis not present

## 2021-06-29 DIAGNOSIS — I700 Atherosclerosis of aorta: Secondary | ICD-10-CM

## 2021-06-29 DIAGNOSIS — Z01810 Encounter for preprocedural cardiovascular examination: Secondary | ICD-10-CM | POA: Insufficient documentation

## 2021-06-29 DIAGNOSIS — E876 Hypokalemia: Secondary | ICD-10-CM | POA: Diagnosis not present

## 2021-06-29 DIAGNOSIS — K580 Irritable bowel syndrome with diarrhea: Secondary | ICD-10-CM

## 2021-06-29 DIAGNOSIS — Z6832 Body mass index (BMI) 32.0-32.9, adult: Secondary | ICD-10-CM

## 2021-06-29 DIAGNOSIS — M545 Low back pain, unspecified: Secondary | ICD-10-CM | POA: Diagnosis not present

## 2021-06-29 DIAGNOSIS — K529 Noninfective gastroenteritis and colitis, unspecified: Secondary | ICD-10-CM

## 2021-06-29 DIAGNOSIS — E785 Hyperlipidemia, unspecified: Secondary | ICD-10-CM | POA: Diagnosis not present

## 2021-06-29 DIAGNOSIS — F419 Anxiety disorder, unspecified: Secondary | ICD-10-CM

## 2021-06-29 DIAGNOSIS — G8929 Other chronic pain: Secondary | ICD-10-CM

## 2021-06-29 DIAGNOSIS — F3342 Major depressive disorder, recurrent, in full remission: Secondary | ICD-10-CM

## 2021-06-29 DIAGNOSIS — J449 Chronic obstructive pulmonary disease, unspecified: Secondary | ICD-10-CM

## 2021-06-29 MED ORDER — VALSARTAN-HYDROCHLOROTHIAZIDE 160-12.5 MG PO TABS: 1.0000 | ORAL_TABLET | Freq: Every day | ORAL | 11 refills | Status: DC

## 2021-06-29 MED ORDER — ALPRAZOLAM 0.5 MG PO TABS: 0.5000 mg | ORAL_TABLET | Freq: Two times a day (BID) | ORAL | 2 refills | Status: DC

## 2021-06-29 MED ORDER — HYDROCODONE-ACETAMINOPHEN 10-325 MG PO TABS: 1.0000 | ORAL_TABLET | Freq: Three times a day (TID) | ORAL | 0 refills | Status: DC

## 2021-06-29 MED ORDER — DICYCLOMINE HCL 10 MG PO CAPS: 10.0000 mg | ORAL_CAPSULE | Freq: Four times a day (QID) | ORAL | 0 refills | Status: DC | PRN

## 2021-06-29 MED ORDER — BUDESONIDE-FORMOTEROL FUMARATE 80-4.5 MCG/ACT IN AERO: INHALATION_SPRAY | RESPIRATORY_TRACT | 12 refills | Status: DC

## 2021-06-29 MED ORDER — PANTOPRAZOLE SODIUM 40 MG PO TBEC: 40.0000 mg | DELAYED_RELEASE_TABLET | Freq: Two times a day (BID) | ORAL | 1 refills | Status: DC

## 2021-06-29 MED ORDER — PAROXETINE HCL 20 MG PO TABS: 20.0000 mg | ORAL_TABLET | Freq: Every day | ORAL | 1 refills | Status: DC

## 2021-06-29 NOTE — Patient Instructions (Signed)
Stress, Adult Stress is a normal reaction to life events. Stress is what you feel when life demands more than you are used to, or more than you think you can handle. Some stress can be useful, such as studying for a test or meeting a deadline at work. Stress that occurs too often or for too long can cause problems. It can affect your emotional health and interfere with relationships and normal daily activities. Too much stress can weaken your body's defense system (immune system) and increase your risk for physical illness. If you already have a medical problem, stress can make it worse. What are the causes? All sorts of life events can cause stress. An event that causes stress for one person may not be stressful for another person. Major life events, whether positive or negative, commonly cause stress. Examples include: Losing a job or starting a new job. Losing a loved one. Moving to a new town or home. Getting married or divorced. Having a baby. Getting injured or sick. Less obvious life events can also cause stress, especially if they occur day after day or in combination with each other. Examples include: Working long hours. Driving in traffic. Caring for children. Being in debt. Being in a difficult relationship. What are the signs or symptoms? Stress can cause emotional symptoms, including: Anxiety. This is feeling worried, afraid, on edge, overwhelmed, or out of control. Anger, including irritation or impatience. Depression. This is feeling sad, down, helpless, or guilty. Trouble focusing, remembering, or making decisions. Stress can cause physical symptoms, including: Aches and pains. These may affect your head, neck, back, stomach, or other areas of your body. Tight muscles or a clenched jaw. Low energy. Trouble sleeping. Stress can cause unhealthy behaviors, including: Eating to feel better (overeating) or skipping meals. Working too much or putting off tasks. Smoking,  drinking alcohol, or using drugs to feel better. How is this diagnosed? Stress is diagnosed through an assessment by your health care provider. He or she may diagnose this condition based on: Your symptoms and any stressful life events. Your medical history. Tests to rule out other causes of your symptoms. Depending on your condition, your health care provider may refer you to a specialist for further evaluation. How is this treated? Stress management techniques are the recommended treatment for stress. Medicine is not typically recommended for the treatment of stress. Techniques to reduce your reaction to stressful life events include: Stress identification. Monitor yourself for symptoms of stress and identify what causes stress for you. These skills may help you to avoid or prepare for stressful events. Time management. Set your priorities, keep a calendar of events, and learn to say no. Taking these actions can help you avoid making too many commitments. Techniques for coping with stress include: Rethinking the problem. Try to think realistically about stressful events rather than ignoring them or overreacting. Try to find the positives in a stressful situation rather than focusing on the negatives. Exercise. Physical exercise can release both physical and emotional tension. The key is to find a form of exercise that you enjoy and do it regularly. Relaxation techniques. These relax the body and mind. The key is to find one or more that you enjoy and use the techniques regularly. Examples include: Meditation, deep breathing, or progressive relaxation techniques. Yoga or tai chi. Biofeedback, mindfulness techniques, or journaling. Listening to music, being out in nature, or participating in other hobbies. Practicing a healthy lifestyle. Eat a balanced diet, drink plenty of water, limit or  avoid caffeine, and get plenty of sleep. Having a strong support network. Spend time with family, friends,  or other people you enjoy being around. Express your feelings and talk things over with someone you trust. Counseling or talk therapy with a mental health professional may be helpful if you are having trouble managing stress on your own. Follow these instructions at home: Lifestyle  Avoid drugs. Do not use any products that contain nicotine or tobacco, such as cigarettes, e-cigarettes, and chewing tobacco. If you need help quitting, ask your health care provider. Limit alcohol intake to no more than 1 drink a day for nonpregnant women and 2 drinks a day for men. One drink equals 12 oz of beer, 5 oz of wine, or 1 oz of hard liquor Do not use alcohol or drugs to relax. Eat a balanced diet that includes fresh fruits and vegetables, whole grains, lean meats, fish, eggs, and beans, and low-fat dairy. Avoid processed foods and foods high in added fat, sugar, and salt. Exercise at least 30 minutes on 5 or more days each week. Get 7-8 hours of sleep each night. General instructions  Practice stress management techniques as discussed with your health care provider. Drink enough fluid to keep your urine clear or pale yellow. Take over-the-counter and prescription medicines only as told by your health care provider. Keep all follow-up visits as told by your health care provider. This is important. Contact a health care provider if: Your symptoms get worse. You have new symptoms. You feel overwhelmed by your problems and can no longer manage them on your own. Get help right away if: You have thoughts of hurting yourself or others. If you ever feel like you may hurt yourself or others, or have thoughts about taking your own life, get help right away. You can go to your nearest emergency department or call: Your local emergency services (911 in the [place] A suicide crisis helpline, such as the [place] at (410)025-6148. This is open 24 hours a day. Summary Stress is a  normal reaction to life events. It can cause problems if it happens too often or for too long. Practicing stress management techniques is the best way to treat stress. Counseling or talk therapy with a mental health professional may be helpful if you are having trouble managing stress on your own. This information is not intended to replace advice given to you by your health care provider. Make sure you discuss any questions you have with your health care provider. Document Revised: 11/27/2020 Document Reviewed: 06/05/2020 Elsevier Patient Education  2022 [employer].

## 2021-06-29 NOTE — Progress Notes (Signed)
Subjective:    Patient ID: [name], female    DOB: August 14, 1946, 75 y.o.   MRN: [id number]   Chief Complaint: medical management of chronic issues      HPI:  1. Benign hypertension No c/o chest pain, sob or headache. Does not check blood pressure at home. BP Readings from Last 3 Encounters:  06/29/21 (!) 159/86  06/02/21 (!) 167/85  05/31/21 124/70     2. Aortic atherosclerosis (HCC) No chest pain.  3. Hyperlipidemia with target LDL less than 100 Does not watch diet and does little to no exercise. Lab Results  Component Value Date   CHOL 181 04/02/2021   HDL 69 04/02/2021   LDLCALC 95 04/02/2021   TRIG 92 04/02/2021   CHOLHDL 2.6 04/02/2021     4. Hypokalemia No c/o lower ext cramping. Lab Results  Component Value Date   K 4.2 05/24/2021     5. COPD GOLD ? / active smoker  Has sob most days but no worse then usual. She is still smoking over a pack a day. She is on symbicort and atrovent neb daily. Uses albuterol at least 1x a week.  6. Gastroesophageal reflux disease, unspecified whether esophagitis present Is on protinix daily and is doing well.  7. Irritable bowel syndrome with diarrhea Denies any recent flare ups  8. Recurrent major depressive disorder, in full remission ([place]) Is on paxil daily and is doing well. She denies any side effects. Depression screen [facility] 2/9 06/29/2021 05/25/2021 04/02/2021  Decreased Interest 2 1 1   Down, Depressed, Hopeless 1 1 1   PHQ - 2 Score 3 2 2   Altered sleeping 1 2 1   Tired, decreased energy 1 1 1   Change in appetite 0 0 1  Feeling bad or failure about yourself  1 0 0  Trouble concentrating 0 1 0  Moving slowly or fidgety/restless 0 0 1  Suicidal thoughts 0 0 0  PHQ-9 Score 6 6 6   Difficult doing work/chores Somewhat difficult Somewhat difficult Not difficult at all  Some recent data might be hidden     9. Anxiety Is on xanax and has been for years. She is on pain meds and we have tried to wean her  down but patient gets increasingly anxious. Sh eis asking to increase her nerve medicine. GAD 7 : Generalized Anxiety Score 04/02/2021 01/05/2021 07/02/2020 04/07/2020  Nervous, Anxious, on Edge 1 2 2 3   Control/stop worrying 1 2 2 3   Worry too much - different things 1 2 2 3   Trouble relaxing 1 0 0 3  Restless 1 1 1  0  Easily annoyed or irritable 1 1 1  0  Afraid - awful might happen 1 0 0 0  Total GAD 7 Score 7 8 8 12   Anxiety Difficulty Not difficult at all Somewhat difficult Somewhat difficult Not difficult at all      10. Cigarette smoker She would like to quit smoking but says she just can't  11. Chronic midline low back pain without sciatica Pain assessment: Cause of pain- DDD Pain location- low back Pain on scale of 1-10- 5/10 currently Frequency- daily What increases pain-to much activity What makes pain Better-rest helps some and pain meds help some. Effects on ADL - none Any change in general medical condition-none  Current opioids rx- norco 10/325 TID # meds rx- 90 Effectiveness of current meds-helps Adverse reactions from pain meds-none Morphine equivalent- 30 MME  Pill count performed-No Last drug screen - 07/02/20 ( high risk  q21m, moderate risk q97m, low risk yearly ) Urine drug screen today- No Was the [place] reviewed- yes  If yes were their any concerning findings? - no   Overdose risk: 1  Pain contract signed on: 01/08/21   12. BMI 31.0-31.9,adult No recent weight changes Wt Readings from Last 3 Encounters:  06/29/21 192 lb 12.8 oz (87.5 kg)  06/02/21 189 lb 9.6 oz (86 kg)  05/31/21 190 lb (86.2 kg)   BMI Readings from Last 3 Encounters:  06/29/21 32.08 kg/m  06/02/21 31.55 kg/m  05/31/21 31.62 kg/m       Outpatient Encounter Medications as of 06/29/2021  Medication Sig   albuterol (VENTOLIN HFA) 108 (90 Base) MCG/ACT inhaler INHALE 2 PUFFS BY MOUTH EVERY 6 HOURS AS NEEDED FOR WHEEZING OR SHORTNESS OF BREATH   ALPRAZolam (XANAX) 0.5 MG tablet  Take 0.5 mg by mouth 2 (two) times daily.   Aspirin-Acetaminophen-Caffeine (GOODYS EXTRA STRENGTH) 500-325-65 MG PACK Take 1 packet by mouth 3 (three) times daily as needed (pain.).   budesonide-formoterol (SYMBICORT) 80-4.5 MCG/ACT inhaler Take 2 puffs first thing in am and then another 2 puffs about 12 hours later.   Calcium Carb-Cholecalciferol (CALCIUM+D3 PO) Take 1 tablet by mouth 3 (three) times a week.   CLENPIQ 10-3.5-12 MG-GM -GM/160ML SOLN Take 320 mLs by mouth as directed.   dicyclomine (BENTYL) 10 MG capsule Take 1 capsule (10 mg total) by mouth 4 (four) times daily as needed for spasms.   diphenhydrAMINE (BENADRYL) 25 MG tablet Take 25 mg by mouth in the morning and at bedtime.   doxylamine, Sleep, (UNISOM) 25 MG tablet Take 25 mg by mouth at bedtime as needed for sleep.   fluticasone (FLONASE) 50 MCG/ACT nasal spray SPRAY 2 SPRAYS INTO EACH NOSTRIL EVERY DAY (Patient taking differently: Place 1 spray into both nostrils daily as needed for allergies.)   Homeopathic Products (LEG CRAMPS) TABS Take 1-2 tablets by mouth daily as needed (leg cramps).   HYDROcodone-acetaminophen (NORCO) 10-325 MG tablet Take 1 tablet by mouth every 8 (eight) hours as needed. (Patient taking differently: Take 1 tablet by mouth in the morning, at noon, and at bedtime.)   ipratropium (ATROVENT) 0.02 % nebulizer solution Take 2.5 mLs (0.5 mg total) by nebulization every 6 (six) hours as needed for wheezing or shortness of breath.   pantoprazole (PROTONIX) 40 MG tablet Take 1 tablet (40 mg total) by mouth 2 (two) times daily.   PARoxetine (PAXIL) 20 MG tablet Take 1 tablet (20 mg total) by mouth daily.   Phenazopyridine HCl (AZO DINE PO) Take 1-2 tablets by mouth 3 (three) times daily as needed (urinary pain/discomfort).   Polyethyl Glycol-Propyl Glycol (LUBRICANT EYE DROPS) 0.4-0.3 % SOLN Place 1-2 drops into both eyes 3 (three) times daily as needed (dry/irritated eyes.).   valsartan-hydrochlorothiazide (DIOVAN  HCT) 160-12.5 MG tablet Take 1 tablet by mouth daily.   Facility-Administered Encounter Medications as of 06/29/2021  Medication   acyclovir ointment (ZOVIRAX) 5 %    Past Surgical History:  Procedure Laterality Date   APPENDECTOMY     75 years old   BACK SURGERY     X 2   BIOPSY  08/11/2016   Procedure: BIOPSY;  Surgeon: Daneil Dolin, MD;  Location: [facility];  Service: Endoscopy;;  gastric bx's   BREAST SURGERY     biopsy only   CARPAL TUNNEL RELEASE     right hand   CERVICAL BIOPSY  W/ LOOP ELECTRODE EXCISION  02/2011   CIN-2  with focal ectocervical margin involvement. With CIN-1 ECC negative   CHOLECYSTECTOMY  1984   ESOPHAGOGASTRODUODENOSCOPY  12/01/2011   Hiatal hernia/Mild fibrotic pyloric stenosis status post dilation with passage of  the scope.  The previously noted gastric ulcer completely healed  The remainder of the gastric mucosa appeared normal   ESOPHAGOGASTRODUODENOSCOPY (EGD) WITH PROPOFOL N/A 08/11/2016   Procedure: ESOPHAGOGASTRODUODENOSCOPY (EGD) WITH PROPOFOL;  Surgeon: Daneil Dolin, MD;  Location: [facility];  Service: Endoscopy;  Laterality: N/A;  2:15 PM   FRACTURE SURGERY  10/18/2016   left knee in 3 places   [place]  07/28/2011   Schatzki's ring s/p 8F with small UES tear as well, small hh, 1cm pyloric channel ulcer, bx benign without H.Pylori. Mobic/goody's at time.   MALONEY DILATION N/A 08/11/2016   Procedure: Venia Minks DILATION;  Surgeon: Daneil Dolin, MD;  Location: [facility];  Service: Endoscopy;  Laterality: N/A;   ORIF PATELLA Left 10/19/2016   Procedure: OPEN REDUCTION INTERNAL (ORIF) FIXATION PATELLA;  Surgeon: Carole Civil, MD;  Location: [facility];  Service: Orthopedics;  Laterality: Left;   SPINE SURGERY  2009   cervical disc   SPINE SURGERY  2009   lumbar   TUBAL LIGATION  1976   tumor removed from arm      Family History  Problem Relation Age of Onset   Heart disease Father        CHF   Heart disease Mother     COPD Sister    Diabetes Sister    Neuropathy Sister    COPD Sister    COPD Sister    Cancer Sister    Stroke Brother    Heart disease Brother    Colon cancer Neg Hx    Anesthesia problems Neg Hx    Hypotension Neg Hx    Malignant hyperthermia Neg Hx    Pseudochol deficiency Neg Hx    Colon polyps Neg Hx     New complaints: None today other then wanting to increase nerve medicine  Social history: Lives by herself  Controlled substance contract: 01/08/21      Review of Systems  Constitutional:  Negative for diaphoresis.  Eyes:  Negative for pain.  Respiratory:  Negative for shortness of breath.   Cardiovascular:  Negative for chest pain, palpitations and leg swelling.  Gastrointestinal:  Negative for abdominal pain.  Endocrine: Negative for polydipsia.  Skin:  Negative for rash.  Neurological:  Negative for dizziness, weakness and headaches.  Hematological:  Does not bruise/bleed easily.  All other systems reviewed and are negative.     Objective:   Physical Exam Vitals and nursing note reviewed.  Constitutional:      General: She is not in acute distress.    Appearance: Normal appearance. She is well-developed.  HENT:     Head: Normocephalic.     Right Ear: Tympanic membrane normal.     Left Ear: Tympanic membrane normal.     Nose: Nose normal.     Mouth/Throat:     Mouth: Mucous membranes are moist.  Eyes:     Pupils: Pupils are equal, round, and reactive to light.  Neck:     Vascular: No carotid bruit or JVD.  Cardiovascular:     Rate and Rhythm: Normal rate and regular rhythm.     Heart sounds: Normal heart sounds.  Pulmonary:     Effort: Pulmonary effort is normal. No respiratory distress.     Breath sounds: Normal breath sounds.  No wheezing or rales.  Chest:     Chest wall: No tenderness.  Abdominal:     General: Bowel sounds are normal. There is no distension or abdominal bruit.     Palpations: Abdomen is soft. There is no hepatomegaly,  splenomegaly, mass or pulsatile mass.     Tenderness: There is no abdominal tenderness.  Musculoskeletal:        General: Normal range of motion.     Cervical back: Normal range of motion and neck supple.  Lymphadenopathy:     Cervical: No cervical adenopathy.  Skin:    General: Skin is warm and dry.  Neurological:     Mental Status: She is alert and oriented to person, place, and time.     Deep Tendon Reflexes: Reflexes are normal and symmetric.  Psychiatric:        Behavior: Behavior normal.        Thought Content: Thought content normal.        Judgment: Judgment normal.    BP (!) 159/86   Pulse 79   Temp (!) 97.3 F (36.3 C) (Temporal)   Ht $R'5\' 5"'Yo$  (1.651 m)   Wt 192 lb 12.8 oz (87.5 kg)   SpO2 94%   BMI 32.08 kg/m        Assessment & Plan:  [name] comes in today with chief complaint of Medical Management of Chronic Issues   Diagnosis and orders addressed:  1. Benign hypertension Low sodium diet - valsartan-hydrochlorothiazide (DIOVAN HCT) 160-12.5 MG tablet; Take 1 tablet by mouth daily.  Dispense: 30 tablet; Refill: 11 - CBC with Differential/Platelet - CMP14+EGFR  2. Aortic atherosclerosis ([place])  3. Hyperlipidemia with target LDL less than 100 Low fat diet - Lipid panel  4. Hypokalemia Labs pending  5. COPD GOLD ? / active smoker  Smoking cessation discussed - budesonide-formoterol (SYMBICORT) 80-4.5 MCG/ACT inhaler; Take 2 puffs first thing in am and then another 2 puffs about 12 hours later.  Dispense: 1 each; Refill: 12  6. Gastroesophageal reflux disease, unspecified whether esophagitis present Avoid spicy foods Do not eat 2 hours prior to bedtime - pantoprazole (PROTONIX) 40 MG tablet; Take 1 tablet (40 mg total) by mouth 2 (two) times daily.  Dispense: 180 tablet; Refill: 1  7. Irritable bowel syndrome with diarrhea  8. Recurrent major depressive disorder, in full remission ([place]) Stress management - PARoxetine (PAXIL) 20 MG  tablet; Take 1 tablet (20 mg total) by mouth daily.  Dispense: 90 tablet; Refill: 1  9. Anxiety Deep breathing exercises Do things to stay busy during the day ( Puzzle, cards, adult coloring book ) Cannot increase anxiety meds because still on pain medication - ALPRAZolam (XANAX) 0.5 MG tablet; Take 1 tablet (0.5 mg total) by mouth 2 (two) times daily.  Dispense: 60 tablet; Refill: 2  10. Cigarette smoker Smoking cessation encouraged  11. Chronic midline low back pain without sciatica Moist heat Rest - HYDROcodone-acetaminophen (NORCO) 10-325 MG tablet; Take 1 tablet by mouth in the morning, at noon, and at bedtime.  Dispense: 90 tablet; Refill: 0 - HYDROcodone-acetaminophen (NORCO) 10-325 MG tablet; Take 1 tablet by mouth in the morning, at noon, and at bedtime.  Dispense: 90 tablet; Refill: 0 - HYDROcodone-acetaminophen (NORCO) 10-325 MG tablet; Take 1 tablet by mouth in the morning, at noon, and at bedtime.  Dispense: 90 tablet; Refill: 0  12. BMI 32.0-32.9,adult Discussed diet and exercise for person with BMI >25 Will recheck weight in 3-6 months  13. Chronic  diarrhea - dicyclomine (BENTYL) 10 MG capsule; Take 1 capsule (10 mg total) by mouth 4 (four) times daily as needed for spasms.  Dispense: 360 capsule; Refill: 0   Labs pending Health Maintenance reviewed Diet and exercise encouraged  Follow up plan: 3 months   [name], FNP

## 2021-06-30 LAB — CBC WITH DIFFERENTIAL/PLATELET
Basophils Absolute: 0.1 10*3/uL (ref 0.0–0.2)
Basos: 1 %
EOS (ABSOLUTE): 0.3 10*3/uL (ref 0.0–0.4)
Eos: 3 %
Hematocrit: 43.1 % (ref 34.0–46.6)
Hemoglobin: 14.1 g/dL (ref 11.1–15.9)
Immature Grans (Abs): 0 10*3/uL (ref 0.0–0.1)
Immature Granulocytes: 0 %
Lymphocytes Absolute: 2.9 10*3/uL (ref 0.7–3.1)
Lymphs: 32 %
MCH: 28.1 pg (ref 26.6–33.0)
MCHC: 32.7 g/dL (ref 31.5–35.7)
MCV: 86 fL (ref 79–97)
Monocytes Absolute: 0.7 10*3/uL (ref 0.1–0.9)
Monocytes: 8 %
Neutrophils Absolute: 5 10*3/uL (ref 1.4–7.0)
Neutrophils: 56 %
Platelets: 276 10*3/uL (ref 150–450)
RBC: 5.01 x10E6/uL (ref 3.77–5.28)
RDW: 13.1 % (ref 11.7–15.4)
WBC: 9.1 10*3/uL (ref 3.4–10.8)

## 2021-06-30 LAB — CMP14+EGFR
ALT: 10 IU/L (ref 0–32)
AST: 10 IU/L (ref 0–40)
Albumin/Globulin Ratio: 2.8 — ABNORMAL HIGH (ref 1.2–2.2)
Albumin: 4.5 g/dL (ref 3.7–4.7)
Alkaline Phosphatase: 105 IU/L (ref 44–121)
BUN/Creatinine Ratio: 9 — ABNORMAL LOW (ref 12–28)
BUN: 5 mg/dL — ABNORMAL LOW (ref 8–27)
Bilirubin Total: 0.2 mg/dL (ref 0.0–1.2)
CO2: 28 mmol/L (ref 20–29)
Calcium: 9.6 mg/dL (ref 8.7–10.3)
Chloride: 94 mmol/L — ABNORMAL LOW (ref 96–106)
Creatinine, Ser: 0.53 mg/dL — ABNORMAL LOW (ref 0.57–1.00)
Globulin, Total: 1.6 g/dL (ref 1.5–4.5)
Glucose: 75 mg/dL (ref 70–99)
Potassium: 4.1 mmol/L (ref 3.5–5.2)
Sodium: 136 mmol/L (ref 134–144)
Total Protein: 6.1 g/dL (ref 6.0–8.5)
eGFR: 98 mL/min/{1.73_m2} (ref >59)

## 2021-06-30 LAB — LIPID PANEL
Chol/HDL Ratio: 3 ratio (ref 0.0–4.4)
Cholesterol, Total: 207 mg/dL — ABNORMAL HIGH (ref 100–199)
HDL: 69 mg/dL (ref >39)
LDL Chol Calc (NIH): 113 mg/dL — ABNORMAL HIGH (ref 0–99)
Triglycerides: 142 mg/dL (ref 0–149)
VLDL Cholesterol Cal: 25 mg/dL (ref 5–40)

## 2021-07-01 ENCOUNTER — Ambulatory Visit (HOSPITAL_COMMUNITY): Payer: Medicare Other | Admitting: Anesthesiology

## 2021-07-01 ENCOUNTER — Encounter (HOSPITAL_COMMUNITY)
Admission: RE | Disposition: A | Discharge status: Home / Self Care | Payer: Self-pay | Source: Home / Self Care | Attending: Internal Medicine

## 2021-07-01 ENCOUNTER — Encounter (HOSPITAL_COMMUNITY): Payer: Self-pay | Admitting: Internal Medicine

## 2021-07-01 ENCOUNTER — Ambulatory Visit (HOSPITAL_COMMUNITY)
Admission: RE | Admit: 2021-07-01 | Discharge: 2021-07-01 | Disposition: A | Discharge status: Home / Self Care | Payer: Medicare Other | Attending: Internal Medicine | Admitting: Internal Medicine

## 2021-07-01 DIAGNOSIS — Z791 Long term (current) use of non-steroidal anti-inflammatories (NSAID): Secondary | ICD-10-CM | POA: Diagnosis not present

## 2021-07-01 DIAGNOSIS — D124 Benign neoplasm of descending colon: Secondary | ICD-10-CM | POA: Insufficient documentation

## 2021-07-01 DIAGNOSIS — Z1211 Encounter for screening for malignant neoplasm of colon: Secondary | ICD-10-CM

## 2021-07-01 DIAGNOSIS — K2970 Gastritis, unspecified, without bleeding: Secondary | ICD-10-CM | POA: Insufficient documentation

## 2021-07-01 DIAGNOSIS — K319 Disease of stomach and duodenum, unspecified: Secondary | ICD-10-CM | POA: Insufficient documentation

## 2021-07-01 DIAGNOSIS — K635 Polyp of colon: Secondary | ICD-10-CM | POA: Diagnosis not present

## 2021-07-01 DIAGNOSIS — K449 Diaphragmatic hernia without obstruction or gangrene: Secondary | ICD-10-CM | POA: Diagnosis not present

## 2021-07-01 DIAGNOSIS — I209 Angina pectoris, unspecified: Secondary | ICD-10-CM | POA: Diagnosis not present

## 2021-07-01 DIAGNOSIS — D122 Benign neoplasm of ascending colon: Secondary | ICD-10-CM | POA: Diagnosis not present

## 2021-07-01 DIAGNOSIS — R1314 Dysphagia, pharyngoesophageal phase: Secondary | ICD-10-CM | POA: Diagnosis present

## 2021-07-01 DIAGNOSIS — F17210 Nicotine dependence, cigarettes, uncomplicated: Secondary | ICD-10-CM | POA: Insufficient documentation

## 2021-07-01 DIAGNOSIS — R1310 Dysphagia, unspecified: Secondary | ICD-10-CM | POA: Diagnosis not present

## 2021-07-01 DIAGNOSIS — Z79891 Long term (current) use of opiate analgesic: Secondary | ICD-10-CM | POA: Diagnosis not present

## 2021-07-01 DIAGNOSIS — K222 Esophageal obstruction: Secondary | ICD-10-CM | POA: Diagnosis not present

## 2021-07-01 DIAGNOSIS — Z7951 Long term (current) use of inhaled steroids: Secondary | ICD-10-CM | POA: Diagnosis not present

## 2021-07-01 DIAGNOSIS — Z9049 Acquired absence of other specified parts of digestive tract: Secondary | ICD-10-CM | POA: Insufficient documentation

## 2021-07-01 HISTORY — PX: BIOPSY: SHX5522

## 2021-07-01 HISTORY — PX: POLYPECTOMY: SHX5525

## 2021-07-01 HISTORY — PX: COLONOSCOPY WITH PROPOFOL: SHX5780

## 2021-07-01 HISTORY — PX: MALONEY DILATION: SHX5535

## 2021-07-01 HISTORY — PX: ESOPHAGOGASTRODUODENOSCOPY (EGD) WITH PROPOFOL: SHX5813

## 2021-07-01 SURGERY — COLONOSCOPY WITH PROPOFOL
Anesthesia: General

## 2021-07-01 MED ORDER — IPRATROPIUM-ALBUTEROL 0.5-2.5 (3) MG/3ML IN SOLN
3.0000 mL | Freq: Once | RESPIRATORY_TRACT | Status: AC
  Administered 2021-07-01: 3 mL via RESPIRATORY_TRACT

## 2021-07-01 MED ORDER — PROPOFOL 500 MG/50ML IV EMUL
INTRAVENOUS | Status: DC | PRN
  Administered 2021-07-01 (×2): 100 ug/kg/min via INTRAVENOUS

## 2021-07-01 MED ORDER — IPRATROPIUM-ALBUTEROL 0.5-2.5 (3) MG/3ML IN SOLN
RESPIRATORY_TRACT | Status: AC
  Filled 2021-07-01: qty 3

## 2021-07-01 MED ORDER — LACTATED RINGERS IV SOLN
INTRAVENOUS | Status: DC
  Administered 2021-07-01: 1000 mL via INTRAVENOUS

## 2021-07-01 MED ORDER — LIDOCAINE HCL (CARDIAC) PF 50 MG/5ML IV SOSY
PREFILLED_SYRINGE | INTRAVENOUS | Status: DC | PRN
  Administered 2021-07-01: 50 mg via INTRAVENOUS

## 2021-07-01 MED ORDER — PROPOFOL 10 MG/ML IV BOLUS
INTRAVENOUS | Status: DC | PRN
Start: 2021-07-01 — End: 2021-07-01
  Administered 2021-07-01: 100 mg via INTRAVENOUS
  Administered 2021-07-01: 20 mg via INTRAVENOUS
  Administered 2021-07-01 (×2): 50 mg via INTRAVENOUS
  Administered 2021-07-01: 40 mg via INTRAVENOUS
  Administered 2021-07-01: 150 mg via INTRAVENOUS

## 2021-07-01 MED ORDER — GLUCAGON HCL RDNA (DIAGNOSTIC) 1 MG IJ SOLR
INTRAMUSCULAR | Status: DC | PRN
  Administered 2021-07-01 (×2): .25 mg via INTRAVENOUS

## 2021-07-01 NOTE — Anesthesia Procedure Notes (Addendum)
Date/Time: 07/01/2021 10:14 AM Performed by: Vista Deck, CRNA Pre-anesthesia Checklist: Patient identified, Emergency Drugs available, Suction available, Timeout performed and Patient being monitored Patient Re-evaluated:Patient Re-evaluated prior to induction Oxygen Delivery Method: Nasal Cannula

## 2021-07-01 NOTE — Anesthesia Postprocedure Evaluation (Signed)
Anesthesia Post Note  Patient: [name]  Procedure(s) Performed: COLONOSCOPY WITH PROPOFOL ESOPHAGOGASTRODUODENOSCOPY (EGD) WITH PROPOFOL [place] POLYPECTOMY  Patient location during evaluation: Phase II Anesthesia Type: General Level of consciousness: awake and alert and oriented Pain management: pain level controlled Vital Signs Assessment: post-procedure vital signs reviewed and stable Respiratory status: spontaneous breathing and respiratory function stable Cardiovascular status: blood pressure returned to baseline and stable Postop Assessment: no apparent nausea or vomiting Anesthetic complications: no   No notable events documented.   Last Vitals:  Vitals:   07/01/21 0919 07/01/21 1111  BP: (!) 154/74 109/84  Pulse: 68 78  Resp: (!) 26 19  Temp: 36.8 C 36.4 C  SpO2: 97%     Last Pain:  Vitals:   07/01/21 1111  TempSrc: Oral  PainSc: 0-No pain                 Jacque Garrels C Chestine [place]

## 2021-07-01 NOTE — Transfer of Care (Signed)
Immediate Anesthesia Transfer of Care Note  Patient: [name]  Procedure(s) Performed: COLONOSCOPY WITH PROPOFOL ESOPHAGOGASTRODUODENOSCOPY (EGD) WITH PROPOFOL [name] BIOPSY POLYPECTOMY  Patient Location: Short Stay  Anesthesia Type:General  Level of Consciousness: awake and patient cooperative  Airway & Oxygen Therapy: Patient Spontanous Breathing and Patient connected to nasal cannula oxygen  Post-op Assessment: Report given to RN and Post -op Vital signs reviewed and stable  Post vital signs: Reviewed and stable  Last Vitals:  Vitals Value Taken Time  BP    Temp    Pulse    Resp    SpO2     SEE VITAL SIGN FLOW SHEET Last Pain:  Vitals:   07/01/21 1018  TempSrc:   PainSc: 0-No pain      Patients Stated Pain Goal: 6 (60/15/61 5379)  Complications: No notable events documented.

## 2021-07-01 NOTE — Discharge Instructions (Addendum)
Colonoscopy Discharge Instructions  Read the instructions outlined below and refer to this sheet in the next few weeks. These discharge instructions provide you with general information on caring for yourself after you leave the hospital. Your doctor may also give you specific instructions. While your treatment has been planned according to the most current medical practices available, unavoidable complications occasionally occur. If you have any problems or questions after discharge, call Dr. Gala Romney at [phone]. ACTIVITY You may resume your regular activity, but move at a slower pace for the next 24 hours.  Take frequent rest periods for the next 24 hours.  Walking will help get rid of the air and reduce the bloated feeling in your belly (abdomen).  No driving for 24 hours (because of the medicine (anesthesia) used during the test).   Do not sign any important legal documents or operate any machinery for 24 hours (because of the anesthesia used during the test).  NUTRITION Drink plenty of fluids.  You may resume your normal diet as instructed by your doctor.  Begin with a light meal and progress to your normal diet. Heavy or fried foods are harder to digest and may make you feel sick to your stomach (nauseated).  Avoid alcoholic beverages for 24 hours or as instructed.  MEDICATIONS You may resume your normal medications unless your doctor tells you otherwise.  WHAT YOU CAN EXPECT TODAY Some feelings of bloating in the abdomen.  Passage of more gas than usual.  Spotting of blood in your stool or on the toilet paper.  IF YOU HAD POLYPS REMOVED DURING THE COLONOSCOPY: No aspirin products for 7 days or as instructed.  No alcohol for 7 days or as instructed.  Eat a soft diet for the next 24 hours.  FINDING OUT THE RESULTS OF YOUR TEST Not all test results are available during your visit. If your test results are not back during the visit, make an appointment with your caregiver to find out the  results. Do not assume everything is normal if you have not heard from your caregiver or the medical facility. It is important for you to follow up on all of your test results.  SEEK IMMEDIATE MEDICAL ATTENTION IF: You have more than a spotting of blood in your stool.  Your belly is swollen (abdominal distention).  You are nauseated or vomiting.  You have a temperature over 101.  You have abdominal pain or discomfort that is severe or gets worse throughout the day.   EGD Discharge instructions Please read the instructions outlined below and refer to this sheet in the next few weeks. These discharge instructions provide you with general information on caring for yourself after you leave the hospital. Your doctor may also give you specific instructions. While your treatment has been planned according to the most current medical practices available, unavoidable complications occasionally occur. If you have any problems or questions after discharge, please call your doctor. ACTIVITY You may resume your regular activity but move at a slower pace for the next 24 hours.  Take frequent rest periods for the next 24 hours.  Walking will help expel (get rid of) the air and reduce the bloated feeling in your abdomen.  No driving for 24 hours (because of the anesthesia (medicine) used during the test).  You may shower.  Do not sign any important legal documents or operate any machinery for 24 hours (because of the anesthesia used during the test).  NUTRITION Drink plenty of fluids.  You may  resume your normal diet.  Begin with a light meal and progress to your normal diet.  Avoid alcoholic beverages for 24 hours or as instructed by your caregiver.  MEDICATIONS You may resume your normal medications unless your caregiver tells you otherwise.  WHAT YOU CAN EXPECT TODAY You may experience abdominal discomfort such as a feeling of fullness or "gas" pains.  FOLLOW-UP Your doctor will discuss the results of  your test with you.  SEEK IMMEDIATE MEDICAL ATTENTION IF ANY OF THE FOLLOWING OCCUR: Excessive nausea (feeling sick to your stomach) and/or vomiting.  Severe abdominal pain and distention (swelling).  Trouble swallowing.  Temperature over 101 F (37.8 C).  Rectal bleeding or vomiting of blood.     3 polyps removed from your colon.  Polyp and diverticulosis information provided  Your esophagus was stretched today.  Your stomach was inflamed and biopsied.    Continue Protonix daily   office visit with [place] in 3 months  At patient request, I called Massie Bougie at [phone] findings and recommendations

## 2021-07-01 NOTE — Anesthesia Preprocedure Evaluation (Addendum)
Anesthesia Evaluation  Patient identified by MRN, date of birth, ID band Patient awake    Reviewed: Allergy & Precautions, NPO status , Patient's Chart, lab work & pertinent test results  History of Anesthesia Complications (+) PONV, Family history of anesthesia reaction and history of anesthetic complications  Airway Mallampati: II  TM Distance: >3 FB Neck ROM: Full    Dental  (+) Edentulous Upper, Edentulous Lower   Pulmonary shortness of breath and Long-Term Oxygen Therapy, pneumonia, COPD, Current SmokerPatient did not abstain from smoking.,     + wheezing      Cardiovascular hypertension, Pt. on medications + angina Normal cardiovascular exam Rhythm:Regular Rate:Normal     Neuro/Psych  Headaches, PSYCHIATRIC DISORDERS Anxiety Depression  Neuromuscular disease    GI/Hepatic Neg liver ROS, PUD, GERD  Medicated and Controlled,  Endo/Other  negative endocrine ROS  Renal/GU negative Renal ROS     Musculoskeletal  (+) Arthritis , Osteoarthritis,    Abdominal   Peds  Hematology negative hematology ROS (+)   Anesthesia Other Findings NECK PAIN  Reproductive/Obstetrics                            Anesthesia Physical Anesthesia Plan  ASA: 4  Anesthesia Plan: General   Post-op Pain Management:    Induction: Intravenous  PONV Risk Score and Plan: Propofol infusion  Airway Management Planned: Nasal Cannula and Natural Airway  Additional Equipment:   Intra-op Plan:   Post-operative Plan:   Informed Consent: I have reviewed the patients History and Physical, chart, labs and discussed the procedure including the risks, benefits and alternatives for the proposed anesthesia with the patient or authorized representative who has indicated his/her understanding and acceptance.     Dental advisory given  Plan Discussed with: CRNA and Surgeon  Anesthesia Plan Comments:          Anesthesia Quick Evaluation

## 2021-07-01 NOTE — H&P (Signed)
@LOGO @   Primary Care Physician:  Chevis Pretty, FNP Primary Gastroenterologist:  Dr. Gala Romney  Pre-Procedure History & Physical: HPI:  [name] is a 75 y.o. female here for For evaluation of recurrent esophageal dysphagia and screening colonoscopy.  History of a Schatzki's ring dilated previously.  Last colonoscopy over 10 years ago.  Reportedly diverticulosis only.  Past Medical History:  Diagnosis Date   Angina    with indigestion   Anxiety    Arthritis    all over esp. in joints   Complication of anesthesia    COPD (chronic obstructive pulmonary disease) (HCC)    Depression    Family history of adverse reaction to anesthesia    GERD (gastroesophageal reflux disease)    Headache(784.0)    normal h/a   HPV in female    HTN (hypertension)    Hypercholesteremia    Hypercholesterolemia    IBS (irritable bowel syndrome)    Pneumonia 2007   PONV (postoperative nausea and vomiting)    S/P colonoscopy 05/30/2008   Dr. Deatra Ina: (records reviewed by AS, NP) findings normal to cecum, scattered diverticula in sigmoid colon    S/P endoscopy 05/15/2008   Dr. Deatra Ina: (records reviewed by AS,NP): [facility] dilator for stricture distal esophagus, also noted duodenal stricture, op notes state unable to pass 54mm scope through pylorus   Shortness of breath    with exertion   Stomach ulcer     Past Surgical History:  Procedure Laterality Date   APPENDECTOMY     75 years old   BACK SURGERY     X 2   BIOPSY  08/11/2016   Procedure: BIOPSY;  Surgeon: Daneil Dolin, MD;  Location: [facility];  Service: Endoscopy;;  gastric bx's   BREAST SURGERY     biopsy only   CARPAL TUNNEL RELEASE     right hand   CERVICAL BIOPSY  W/ LOOP ELECTRODE EXCISION  02/2011   CIN-2 with focal ectocervical margin involvement. With CIN-1 ECC negative   CHOLECYSTECTOMY  1984   ESOPHAGOGASTRODUODENOSCOPY  12/01/2011   Hiatal hernia/Mild fibrotic pyloric stenosis status post dilation with passage  of  the scope.  The previously noted gastric ulcer completely healed  The remainder of the gastric mucosa appeared normal   ESOPHAGOGASTRODUODENOSCOPY (EGD) WITH PROPOFOL N/A 08/11/2016   Procedure: ESOPHAGOGASTRODUODENOSCOPY (EGD) WITH PROPOFOL;  Surgeon: Daneil Dolin, MD;  Location: [facility];  Service: Endoscopy;  Laterality: N/A;  2:15 PM   FRACTURE SURGERY  10/18/2016   left knee in 3 places   [place]  07/28/2011   Schatzki's ring s/p 56F with small UES tear as well, small hh, 1cm pyloric channel ulcer, bx benign without H.Pylori. Mobic/goody's at time.   MALONEY DILATION N/A 08/11/2016   Procedure: Venia Minks DILATION;  Surgeon: Daneil Dolin, MD;  Location: [facility];  Service: Endoscopy;  Laterality: N/A;   ORIF PATELLA Left 10/19/2016   Procedure: OPEN REDUCTION INTERNAL (ORIF) FIXATION PATELLA;  Surgeon: Carole Civil, MD;  Location: [facility];  Service: Orthopedics;  Laterality: Left;   SPINE SURGERY  2009   cervical disc   SPINE SURGERY  2009   lumbar   TUBAL LIGATION  1976   tumor removed from arm      Prior to Admission medications   Medication Sig Start Date End Date Taking? Authorizing Provider  albuterol (VENTOLIN HFA) 108 (90 Base) MCG/ACT inhaler INHALE 2 PUFFS BY MOUTH EVERY 6 HOURS AS NEEDED FOR WHEEZING OR SHORTNESS  OF BREATH 11/03/20  Yes Hassell Done, Mary-Margaret, FNP  ALPRAZolam Duanne Moron) 0.5 MG tablet Take 1 tablet (0.5 mg total) by mouth 2 (two) times daily. 06/29/21  Yes Martin, Mary-Margaret, FNP  Aspirin-Acetaminophen-Caffeine (GOODYS EXTRA STRENGTH) [phone] MG PACK Take 1 packet by mouth 3 (three) times daily as needed (pain.).   Yes [provider]  budesonide-formoterol (SYMBICORT) 80-4.5 MCG/ACT inhaler Take 2 puffs first thing in am and then another 2 puffs about 12 hours later. 06/29/21  Yes Martin, Mary-Margaret, FNP  Calcium Carb-Cholecalciferol (CALCIUM+D3 PO) Take 1 tablet by mouth 3 (three) times a week.   Yes [provider]  dicyclomine (BENTYL) 10 MG capsule Take 1 capsule (10 mg total) by mouth 4 (four) times daily as needed for spasms. 06/29/21  Yes Hassell Done, Mary-Margaret, FNP  diphenhydrAMINE (BENADRYL) 25 MG tablet Take 25 mg by mouth in the morning and at bedtime.   Yes [provider]  doxylamine, Sleep, (UNISOM) 25 MG tablet Take 25 mg by mouth at bedtime as needed for sleep.   Yes [provider]  fluticasone (FLONASE) 50 MCG/ACT nasal spray SPRAY 2 SPRAYS INTO EACH NOSTRIL EVERY DAY Patient taking differently: Place 1 spray into both nostrils daily as needed for allergies. 11/30/20  Yes Hassell Done, Mary-Margaret, FNP  Homeopathic Products (LEG CRAMPS) TABS Take 1-2 tablets by mouth daily as needed (leg cramps).   Yes [provider]  HYDROcodone-acetaminophen (NORCO) 10-325 MG tablet Take 1 tablet by mouth in the morning, at noon, and at bedtime. 08/28/21 09/27/21 Yes Martin, Mary-Margaret, FNP  HYDROcodone-acetaminophen (NORCO) 10-325 MG tablet Take 1 tablet by mouth in the morning, at noon, and at bedtime. 07/29/21 08/28/21 Yes Martin, Mary-Margaret, FNP  HYDROcodone-acetaminophen (NORCO) 10-325 MG tablet Take 1 tablet by mouth in the morning, at noon, and at bedtime. 06/29/21 07/29/21 Yes Martin, Mary-Margaret, FNP  ipratropium (ATROVENT) 0.02 % nebulizer solution Take 2.5 mLs (0.5 mg total) by nebulization every 6 (six) hours as needed for wheezing or shortness of breath. 01/05/21  Yes Martin, Mary-Margaret, FNP  pantoprazole (PROTONIX) 40 MG tablet Take 1 tablet (40 mg total) by mouth 2 (two) times daily. 06/29/21  Yes Martin, Mary-Margaret, FNP  PARoxetine (PAXIL) 20 MG tablet Take 1 tablet (20 mg total) by mouth daily. 06/29/21  Yes Hassell Done, Mary-Margaret, FNP  Phenazopyridine HCl (AZO DINE PO) Take 1-2 tablets by mouth 3 (three) times daily as needed (urinary pain/discomfort).   Yes [provider]  Polyethyl Glycol-Propyl Glycol (LUBRICANT EYE DROPS) 0.4-0.3 % SOLN Place 1-2  drops into both eyes 3 (three) times daily as needed (dry/irritated eyes.).   Yes [provider]  valsartan-hydrochlorothiazide (DIOVAN HCT) 160-12.5 MG tablet Take 1 tablet by mouth daily. 06/29/21  Yes Martin, Mary-Margaret, FNP  CLENPIQ 10-3.5-12 MG-GM -GM/160ML SOLN Take 320 mLs by mouth as directed. 06/02/21   [provider]    Allergies as of 06/02/2021 - Review Complete 06/02/2021  Allergen Reaction Noted   Meloxicam Other (See Comments) 11/15/2011    Family History  Problem Relation Age of Onset   Heart disease Father        CHF   Heart disease Mother    COPD Sister    Diabetes Sister    Neuropathy Sister    COPD Sister    COPD Sister    Cancer Sister    Stroke Brother    Heart disease Brother    Colon cancer Neg Hx    Anesthesia problems Neg Hx    Hypotension Neg Hx  Malignant hyperthermia Neg Hx    Pseudochol deficiency Neg Hx    Colon polyps Neg Hx     Social History   Socioeconomic History   Marital status: Widowed    Spouse name: Not on file   Number of children: 5   Years of education: 9   Highest education level: 9th grade  Occupational History   Occupation: disability    Employer: [employer]    Employer: [employer]  Tobacco Use   Smoking status: Every Day    Packs/day: 1.25    Years: 58.00    Pack years: 72.50    Types: Cigarettes   Smokeless tobacco: Never   Tobacco comments:    1/2 ppd 05/31/21   Vaping Use   Vaping Use: Never used  Substance and Sexual Activity   Alcohol use: No    Comment: hx of ETOH abuse in past, none in 10 years   Drug use: No   Sexual activity: Not Currently    Birth control/protection: Surgical  Other Topics Concern   Not on file  Social History Narrative   Husband passed away 05-04-2015 from lung cancer/leukemia.    Social Determinants of Health   Financial Resource Strain: Low Risk    Difficulty of Paying Living Expenses: Not hard at all  Food Insecurity: No Food Insecurity    Worried About Charity fundraiser in the Last Year: Never true   [place] in the Last Year: Never true  Transportation Needs: No Transportation Needs   Lack of Transportation (Medical): No   Lack of Transportation (Non-Medical): No  Physical Activity: Inactive   Days of Exercise per Week: 0 days   Minutes of Exercise per Session: 0 min  Stress: Stress Concern Present   Feeling of Stress : Rather much  Social Connections: Moderately Isolated   Frequency of Communication with Friends and Family: More than three times a week   Frequency of Social Gatherings with Friends and Family: More than three times a week   Attends Religious Services: More than 4 times per year   Active Member of [employer] or Organizations: No   Attends Archivist Meetings: Never   Marital Status: Widowed  Human resources officer Violence: Not At Risk   Fear of Current or Ex-Partner: No   Emotionally Abused: No   Physically Abused: No   Sexually Abused: No    Review of Systems: See HPI, otherwise negative ROS  Physical Exam: BP (!) 154/74   Pulse 68   Temp 98.3 F (36.8 C) (Oral)   Resp (!) 26   SpO2 97%  General:   Alert,  Well-developed, well-nourished, pleasant and cooperative in NAD Neck:  Supple; no masses or thyromegaly. No significant cervical adenopathy. Lungs:  Clear throughout to auscultation.   No wheezes, crackles, or rhonchi. No acute distress. Heart:  Regular rate and rhythm; no murmurs, clicks, rubs,  or gallops. Abdomen: Non-distended, normal bowel sounds.  Soft and nontender without appreciable mass or hepatosplenomegaly.   Impression/Plan:    75 year old lady with recurrent esophageal dysphagia in setting of a Schatzki's ring here for EGD with esophageal dilation as feasible/appropriate per plan.  Also, here for average risk screening colonoscopy.  The risks, benefits, limitations, imponderables and alternatives regarding both EGD and colonoscopy have been reviewed with the patient.  Questions have been answered. All parties agreeable.       Notice: This dictation was prepared with Dragon dictation along with smaller phrase technology. Any transcriptional  errors that result from this process are unintentional and may not be corrected upon review.

## 2021-07-01 NOTE — Op Note (Addendum)
[facility] Patient Name: [name] Procedure Date: 07/01/2021 9:55 AM MRN: [id number] Date of Birth: 02-26-1946 Attending MD: Norvel Richards , MD CSN: [id number] Age: 75 Admit Type: Outpatient Procedure:                Upper GI endoscopy Indications:              Dysphagia Providers:                Norvel Richards, MD, Janeece Riggers, RN, Raphael Gibney, Technician Referring MD:              Medicines:                Propofol per Anesthesia Complications:            No immediate complications. Estimated Blood Loss:     Estimated blood loss was minimal. Procedure:                Pre-Anesthesia Assessment:                           - Prior to the procedure, a History and Physical                            was performed, and patient medications and                            allergies were reviewed. The patient's tolerance of                            previous anesthesia was also reviewed. The risks                            and benefits of the procedure and the sedation                            options and risks were discussed with the patient.                            All questions were answered, and informed consent                            was obtained. Prior Anticoagulants: The patient has                            taken no previous anticoagulant or antiplatelet                            agents. ASA Grade Assessment: III - A patient with                            severe systemic disease. After reviewing the risks  and benefits, the patient was deemed in                            satisfactory condition to undergo the procedure.                           After obtaining informed consent, the endoscope was                            passed under direct vision. Throughout the                            procedure, the patient's blood pressure, pulse, and                            oxygen saturations  were monitored continuously. The                            GIF-H190 ([id number]) scope was introduced through the                            mouth, and advanced to the second part of duodenum.                            The upper GI endoscopy was accomplished without                            difficulty. The patient tolerated the procedure                            well. Scope In: 10:23:29 AM Scope Out: 10:33:37 AM Total Procedure Duration: 0 hours 10 minutes 8 seconds  Findings:      A moderate Schatzki ring was found at the gastroesophageal junction.      A small hiatal hernia was present. Pyloric channel slightly stenotic       with a "diaphragm" just distal to the pyloric channel itself. Scope       admitted with mild resistance. This partially dilated to channel       examination the bulb and second portion of, otherwise, revealed no       abnormalities. Minimal residual bile-stained mucus. Partially distal       alluded pill fragment found in the stomach. Scattered erosions and       erythema. No ulcer or infiltrating process. Scope was withdrawn. A 5054       French Maloney dilator later was passed to full insertion with mild       resistance. A look back revealed the ring remained intact. Subsequently,       a 74 French Maloney dilator was passed to mild to moderate resistance. A       look back revealed the ring and been ruptured nicely without apparent       complication. Finally, biopsies the abnormal gastric mucosa were taken       for histologic study. Impression:               - Moderate Schatzki ring. Status post dilation.                           -  Small hiatal hernia. Gastric inflammation as                            described?status post biopsy. Pyloric channel                            diaphragm likely a marker of NSAID use.                           - Moderate Sedation:      Moderate (conscious) sedation was personally administered by an       anesthesia  professional. The following parameters were monitored: oxygen       saturation, heart rate, blood pressure, respiratory rate, EKG, adequacy       of pulmonary ventilation, and response to care. Recommendation:           - Patient has a contact number available for                            emergencies. The signs and symptoms of potential                            delayed complications were discussed with the                            patient. Return to normal activities tomorrow.                            Written discharge instructions were provided to the                            patient.                           - Advance diet as tolerated.                           - Continue present medications. follow-up on                            pathology. See colonoscopy report. Procedure Code(s):        --- Professional ---                           [phone], Esophagogastroduodenoscopy, flexible,                            transoral; diagnostic, including collection of                            specimen(s) by brushing or washing, when performed                            (separate procedure) Diagnosis Code(s):        --- Professional ---  K22.2, Esophageal obstruction                           K44.9, Diaphragmatic hernia without obstruction or                            gangrene                           R13.10, Dysphagia, unspecified CPT copyright 2019 American Medical Association. All rights reserved. The codes documented in this report are preliminary and upon coder review may  be revised to meet current compliance requirements. Cristopher Estimable. Janika Jedlicka, MD Norvel Richards, MD 07/01/2021 11:12:33 AM This report has been signed electronically. Number of Addenda: 0

## 2021-07-02 ENCOUNTER — Encounter: Payer: Self-pay | Admitting: Internal Medicine

## 2021-07-02 LAB — SURGICAL PATHOLOGY

## 2021-07-02 NOTE — Op Note (Signed)
[facility] Patient Name: [name] Procedure Date: 07/01/2021 9:53 AM MRN: [id number] Date of Birth: September 26, 1946 Attending MD: Norvel Richards , MD CSN: [id number] Age: 75 Admit Type: Outpatient Procedure:                Colonoscopy Indications:              Screening for colorectal malignant neoplasm Providers:                Norvel Richards, MD, Janeece Riggers, RN, Raphael Gibney, Technician Referring MD:              Medicines:                Propofol per Anesthesia Complications:            No immediate complications. Estimated Blood Loss:     Estimated blood loss was minimal. Procedure:                Pre-Anesthesia Assessment:                           - Prior to the procedure, a History and Physical                            was performed, and patient medications and                            allergies were reviewed. The patient's tolerance of                            previous anesthesia was also reviewed. The risks                            and benefits of the procedure and the sedation                            options and risks were discussed with the patient.                            All questions were answered, and informed consent                            was obtained. Prior Anticoagulants: The patient has                            taken no previous anticoagulant or antiplatelet                            agents. ASA Grade Assessment: III - A patient with                            severe systemic disease. After reviewing the risks  and benefits, the patient was deemed in                            satisfactory condition to undergo the procedure.                           After obtaining informed consent, the colonoscope                            was passed under direct vision. Throughout the                            procedure, the patient's blood pressure, pulse, and                             oxygen saturations were monitored continuously. The                            [id number]) scope was introduced through the                            anus and advanced to the the cecum, identified by                            appendiceal orifice and ileocecal valve. The                            colonoscopy was performed without difficulty. The                            patient tolerated the procedure well. The quality                            of the bowel preparation was adequate. Scope In: 10:41:23 AM Scope Out: 11:03:26 AM Scope Withdrawal Time: 0 hours 15 minutes 58 seconds  Total Procedure Duration: 0 hours 22 minutes 3 seconds  Findings:      The perianal and digital rectal examinations were normal.      Scattered medium-mouthed diverticula were found in the sigmoid colon and       descending colon.      Three sessile polyps were found in the descending colon and ascending       colon. The polyps were 4 to 7 mm in size. These polyps were removed with       a cold snare. Resection and retrieval were complete. Estimated blood       loss was minimal.      The exam was otherwise without abnormality on direct and retroflexion       views. Impression:               - Moderate diverticulosis in the sigmoid colon and                            in the descending colon. - Three 4 to 7 mm polyps  in the descending colon and in the ascending colon,                            removed with a cold snare. Resected and retrieved.                           - The examination was otherwise normal on direct                            and retroflexion views. Moderate Sedation:      Moderate (conscious) sedation was personally administered by an       anesthesia professional. The following parameters were monitored: oxygen       saturation, heart rate, blood pressure, respiratory rate, EKG, adequacy       of pulmonary ventilation, and response to  care. Recommendation:           - Patient has a contact number available for                            emergencies. The signs and symptoms of potential                            delayed complications were discussed with the                            patient. Return to normal activities tomorrow.                            Written discharge instructions were provided to the                            patient.                           - Resume previous diet.                           - Continue present medications.                           - Repeat colonoscopy date to be determined after                            pending pathology results are reviewed for                            surveillance.                           - Return to GI office (date not yet determined). Procedure Code(s):        --- Professional ---                           [phone], Colonoscopy, flexible; with removal of  tumor(s), polyp(s), or other lesion(s) by snare                            technique Diagnosis Code(s):        --- Professional ---                           Z12.11, Encounter for screening for malignant                            neoplasm of colon                           K63.5, Polyp of colon                           K57.30, Diverticulosis of large intestine without                            perforation or abscess without bleeding CPT copyright 2019 American Medical Association. All rights reserved. The codes documented in this report are preliminary and upon coder review may  be revised to meet current compliance requirements. Cristopher Estimable. Herman Fiero, MD Norvel Richards, MD 07/02/2021 3:19:48 PM This report has been signed electronically. Number of Addenda: 0

## 2021-07-06 ENCOUNTER — Encounter (HOSPITAL_COMMUNITY): Payer: Self-pay | Admitting: Internal Medicine

## 2021-07-22 ENCOUNTER — Ambulatory Visit: Payer: Medicare Other | Admitting: Nurse Practitioner

## 2021-07-23 DIAGNOSIS — J441 Chronic obstructive pulmonary disease with (acute) exacerbation: Secondary | ICD-10-CM | POA: Diagnosis not present

## 2021-07-26 ENCOUNTER — Other Ambulatory Visit: Payer: Self-pay | Admitting: Nurse Practitioner

## 2021-07-26 DIAGNOSIS — J449 Chronic obstructive pulmonary disease, unspecified: Secondary | ICD-10-CM

## 2021-08-02 ENCOUNTER — Other Ambulatory Visit: Payer: Self-pay

## 2021-08-02 ENCOUNTER — Encounter: Payer: Self-pay | Admitting: Internal Medicine

## 2021-08-02 ENCOUNTER — Ambulatory Visit: Payer: Medicare Other | Admitting: Internal Medicine

## 2021-08-02 DIAGNOSIS — J449 Chronic obstructive pulmonary disease, unspecified: Secondary | ICD-10-CM | POA: Diagnosis not present

## 2021-08-02 DIAGNOSIS — F17210 Nicotine dependence, cigarettes, uncomplicated: Secondary | ICD-10-CM

## 2021-08-02 NOTE — Patient Instructions (Addendum)
We will referring you to resume lung cancer screening    Plan A = Automatic = Always=    Symbicort 80 Take 2 puffs first thing in am and then another 2 puffs about 12 hours later.    Work on inhaler technique:  relax and gently blow all the way out then take a nice smooth full deep breath back in, triggering the inhaler at same time you start breathing in.  Hold for up to 5 seconds if you can. Blow out thru nose. Rinse and gargle with water when done.  If mouth or throat bother you at all,  try brushing teeth/gums/tongue with arm and hammer toothpaste/ make a slurry and gargle and spit out.   Save the empty symbicort container to take practice inhaler   Plan B = Backup (to supplement plan A, not to replace it) Only use your albuterol inhaler as a rescue medication to be used if you can't catch your breath by resting or doing a relaxed purse lip breathing pattern.  - The less you use it, the better it will work when you need it. - Ok to use the inhaler up to 2 puffs  every 4 hours if you must but call for appointment if use goes up over your usual need - Don't leave home without it !!  (think of it like the spare tire for your car)   Plan C = Crisis (instead of Plan B but only if Plan B stops working) - only use your albuterol nebulizer if you first try Plan B and it fails to help > ok to use the nebulizer up to every 4 hours but if start needing it regularly call for immediate appointment   If needing more and more albuterol I will be recommending Breztri as PLAN A    Please schedule a follow up visit in 6 months but call sooner if needed

## 2021-08-02 NOTE — Progress Notes (Signed)
[name], female    DOB: Jul 03, 1946     MRN: [id number]   Brief patient profile:  69 yowf active smoker with tendency to bronchitis referred to pulmonary clinic in [place]  05/31/2021 by [name]      History of Present Illness  05/31/2021  Pulmonary/ 1st office eval/ [name] / [name] [facility]  Chief Complaint  Patient presents with   Pulmonary Consult    Referred by [name].  Pt states dx with COPD approx 10 years ago. She states her breathing has been worse over the past year. She states that she gets winded walking to her mailbox. She has had CP off and on over the past 3 days- starts in center of her chest and moves up to the left. She has occ cough, mainly non prod. She uses her rescue inhaler about once per day.    Dyspnea:  onset 2020 but getting worse still able to grocery shopping at [employer] Cough: each am x 1 hour > clear mucus only  Sleep: bed is flat/ one pillow  SABA use: uses 1-2 x day / not using neb in 2 days but not the BREO  02:  has it, not using  Rec Plan A = Automatic = Always=   Symbicort 80 Take 2 puffs first thing in am and then another 2 puffs about 12 hours later.  Work on inhaler technique:  Plan B = Backup (to supplement plan A, not to replace it) Only use your albuterol inhaler as a rescue medication  Plan C = Crisis (instead of Plan B but only if Plan B stops working) - only use your albuterol nebulizer if you first try Plan B and it fails to help > ok to use the nebulizer up to every 4 hours but if start needing it regularly call for immediate appointment Stop the lisinopril  Start valsartan 160-12.5 in its place         08/02/2021  f/u ov/[name] office/[name] re: maint on symbicort 80 / still smoking  Chief Complaint  Patient presents with   Follow-up    Uses 2L O2 prn mainly at night time. Coughing persistently.   Dyspnea:  still doing [employer] leaning on cart limited by back/ legs Cough: worse first thing  in am x clear mucus  Sleeping: flat one pillow/ insomnia  SABA use: once a day/ neb sev times a week  02: has it not using it  Covid status: vax x none/ never infected  Lung cancer screening: none    No obvious day to day or daytime variability or assoc excess/ purulent sputum or mucus plugs or hemoptysis or cp or chest tightness, subjective wheeze or overt sinus or hb symptoms.   Sleeping  without nocturnal  or early am exacerbation  of respiratory  c/o's or need for noct saba. Also denies any obvious fluctuation of symptoms with weather or environmental changes or other aggravating or alleviating factors except as outlined above   No unusual exposure hx or h/o childhood pna/ asthma or knowledge of premature birth.  Current Allergies, Complete Past Medical History, Past Surgical History, Family History, and Social History were reviewed in [employer] record.  ROS  The following are not active complaints unless bolded Hoarseness, sore throat, dysphagia, dental problems, itching, sneezing,  nasal congestion or discharge of excess mucus or purulent secretions, ear ache,   fever, chills, sweats, unintended wt loss or wt gain, classically pleuritic or exertional cp,  orthopnea pnd or arm/hand swelling  or leg swelling, presyncope, palpitations, abdominal pain, anorexia, nausea, vomiting, diarrhea  or change in bowel habits or change in bladder habits, change in stools or change in urine, dysuria, hematuria,  rash, arthralgias, visual complaints, headache, numbness, weakness or ataxia or problems with walking or coordination,  change in mood or  memory.        Current Meds  Medication Sig   albuterol (VENTOLIN HFA) 108 (90 Base) MCG/ACT inhaler INHALE 2 PUFFS BY MOUTH EVERY 6 HOURS AS NEEDED FOR WHEEZING OR SHORTNESS OF BREATH   ALPRAZolam (XANAX) 0.5 MG tablet Take 1 tablet (0.5 mg total) by mouth 2 (two) times daily.   Aspirin-Acetaminophen-Caffeine (GOODYS EXTRA STRENGTH)  500-325-65 MG PACK Take 1 packet by mouth 3 (three) times daily as needed (pain.).   budesonide-formoterol (SYMBICORT) 80-4.5 MCG/ACT inhaler Take 2 puffs first thing in am and then another 2 puffs about 12 hours later.   Calcium Carb-Cholecalciferol (CALCIUM+D3 PO) Take 1 tablet by mouth 3 (three) times a week.   CLENPIQ 10-3.5-12 MG-GM -GM/160ML SOLN Take 320 mLs by mouth as directed.   dicyclomine (BENTYL) 10 MG capsule Take 1 capsule (10 mg total) by mouth 4 (four) times daily as needed for spasms.   diphenhydrAMINE (BENADRYL) 25 MG tablet Take 25 mg by mouth in the morning and at bedtime.   doxylamine, Sleep, (UNISOM) 25 MG tablet Take 25 mg by mouth at bedtime as needed for sleep.   fluticasone (FLONASE) 50 MCG/ACT nasal spray SPRAY 2 SPRAYS INTO EACH NOSTRIL EVERY DAY (Patient taking differently: Place 1 spray into both nostrils daily as needed for allergies.)   Homeopathic Products (LEG CRAMPS) TABS Take 1-2 tablets by mouth daily as needed (leg cramps).   [START ON 08/28/2021] HYDROcodone-acetaminophen (NORCO) 10-325 MG tablet Take 1 tablet by mouth in the morning, at noon, and at bedtime.   HYDROcodone-acetaminophen (NORCO) 10-325 MG tablet Take 1 tablet by mouth in the morning, at noon, and at bedtime.   ipratropium (ATROVENT) 0.02 % nebulizer solution USE 1 VIAL BY NEBULIZATION EVERY 6 (SIX) HOURS AS NEEDED FOR WHEEZING OR SHORTNESS OF BREATH.   pantoprazole (PROTONIX) 40 MG tablet Take 1 tablet (40 mg total) by mouth 2 (two) times daily.   PARoxetine (PAXIL) 20 MG tablet Take 1 tablet (20 mg total) by mouth daily.   Phenazopyridine HCl (AZO DINE PO) Take 1-2 tablets by mouth 3 (three) times daily as needed (urinary pain/discomfort).   Polyethyl Glycol-Propyl Glycol (LUBRICANT EYE DROPS) 0.4-0.3 % SOLN Place 1-2 drops into both eyes 3 (three) times daily as needed (dry/irritated eyes.).   valsartan-hydrochlorothiazide (DIOVAN HCT) 160-12.5 MG tablet Take 1 tablet by mouth daily.    Current Facility-Administered Medications for the 08/02/21 encounter (Office Visit) with Sharon Rockers, MD  Medication   acyclovir ointment (ZOVIRAX) 5 %          Past Medical History:  Diagnosis Date   Angina    with indigestion   Anxiety    Arthritis    all over esp. in joints   Complication of anesthesia    COPD (chronic obstructive pulmonary disease) (HCC)    Depression    Family history of adverse reaction to anesthesia    GERD (gastroesophageal reflux disease)    Headache(784.0)    normal h/a   HPV in female    HTN (hypertension)    Hypercholesteremia    Hypercholesterolemia    IBS (irritable bowel syndrome)    Pneumonia 2007  PONV (postoperative nausea and vomiting)    S/P colonoscopy 05/30/2008   Dr. Deatra Ina: (records reviewed by AS, NP) findings normal to cecum, scattered diverticula in sigmoid colon    S/P endoscopy 05/15/2008   Dr. Deatra Ina: (records reviewed by AS,NP): [facility] dilator for stricture distal esophagus, also noted duodenal stricture, op notes state unable to pass 93mm scope through pylorus   Shortness of breath    with exertion   Stomach ulcer        Objective:       Wt Readings from Last 3 Encounters:  08/02/21 197 lb 1.3 oz (89.4 kg)  06/29/21 195 lb (88.5 kg)  06/29/21 192 lb 12.8 oz (87.5 kg)      Vital signs reviewed  08/02/2021  - Note at rest 02 sats  93% on RA   General appearance:    amb wf nad / rattling cough and componen of pseudowheeze.    HEENT : pt wearing mask not removed for exam due to covid - 19 concerns.   NECK :  without JVD/Nodes/TM/ nl carotid upstrokes bilaterally   LUNGS: no acc muscle use,  Min barrel  contour chest wall with bilateral distant insp exp rhonchi s wheeze and  without cough on insp or exp maneuvers and min  Hyperresonant  to  percussion bilaterally     CV:  RRR  no s3 or murmur or increase in P2, and no edema   ABD:  soft and nontender with pos end  insp Hoover's  in the supine position.  No bruits or organomegaly appreciated, bowel sounds nl  MS:   Nl gait/  ext warm without deformities, calf tenderness, cyanosis or clubbing No obvious joint restrictions   SKIN: warm and dry without lesions    NEURO:  alert, approp, nl sensorium with  no motor or cerebellar deficits apparent.           Assessment

## 2021-08-03 ENCOUNTER — Encounter: Payer: Self-pay | Admitting: Internal Medicine

## 2021-08-03 NOTE — Assessment & Plan Note (Signed)
Active smoker -  05/31/2021  After extensive coaching inhaler device,  effectiveness =    75% > change to symbicort 80 2bid and d/c acei  - 05/31/2021   Walked on RA  x  3  lap(s) =  approx 450 @ moderate pace, stopped due to end of study with min sob  with lowest 02 sats 90%   - 08/02/2021  After extensive coaching inhaler device,  effectiveness =  75%  hfa quite poor baseline technique and advised on using empty device for training before each symbicort for now and if no better and achieves good technique then breztri might be more effective than low dose symbicort which was chosen to avoid causing cough.

## 2021-08-03 NOTE — Assessment & Plan Note (Signed)
Counseled re importance of smoking cessation but did not meet time criteria for separate billing            Each maintenance medication was reviewed in detail including emphasizing most importantly the difference between maintenance and prns and under what circumstances the prns are to be triggered using an action plan format where appropriate.  Total time for H and P, chart review, counseling, reviewing hfa device(s) and generating customized AVS unique to this office visit / same day charting  > 30 min       

## 2021-08-17 ENCOUNTER — Other Ambulatory Visit: Payer: Self-pay | Admitting: Nurse Practitioner

## 2021-08-17 DIAGNOSIS — J209 Acute bronchitis, unspecified: Secondary | ICD-10-CM

## 2021-08-17 DIAGNOSIS — I10 Essential (primary) hypertension: Secondary | ICD-10-CM

## 2021-08-23 DIAGNOSIS — J441 Chronic obstructive pulmonary disease with (acute) exacerbation: Secondary | ICD-10-CM | POA: Diagnosis not present

## 2021-09-22 DIAGNOSIS — J441 Chronic obstructive pulmonary disease with (acute) exacerbation: Secondary | ICD-10-CM | POA: Diagnosis not present

## 2021-09-23 ENCOUNTER — Encounter: Payer: Medicare Other | Admitting: Nurse Practitioner

## 2021-09-23 DIAGNOSIS — F419 Anxiety disorder, unspecified: Secondary | ICD-10-CM

## 2021-09-23 DIAGNOSIS — G8929 Other chronic pain: Secondary | ICD-10-CM

## 2021-09-23 MED ORDER — HYDROCODONE-ACETAMINOPHEN 10-325 MG PO TABS: 1.0000 | ORAL_TABLET | Freq: Three times a day (TID) | ORAL | 0 refills | Status: DC

## 2021-09-23 MED ORDER — ALPRAZOLAM 0.5 MG PO TABS: 0.5000 mg | ORAL_TABLET | Freq: Two times a day (BID) | ORAL | 0 refills | Status: DC

## 2021-09-23 NOTE — Progress Notes (Signed)
Erroneous encounter- has to be seen for pain meds- appointment rescheduled

## 2021-09-28 ENCOUNTER — Ambulatory Visit: Payer: Medicare Other | Admitting: Nurse Practitioner

## 2021-09-29 ENCOUNTER — Encounter: Payer: Self-pay | Admitting: Internal Medicine

## 2021-09-29 ENCOUNTER — Encounter: Payer: Self-pay | Admitting: Nurse Practitioner

## 2021-09-29 ENCOUNTER — Ambulatory Visit (INDEPENDENT_AMBULATORY_CARE_PROVIDER_SITE_OTHER): Payer: Medicare Other | Admitting: Nurse Practitioner

## 2021-09-29 VITALS — BP 136/80 | HR 82 | Temp 99.6°F | Ht 65.0 in | Wt 190.0 lb

## 2021-09-29 DIAGNOSIS — U071 COVID-19: Secondary | ICD-10-CM

## 2021-09-29 MED ORDER — BENZONATATE 100 MG PO CAPS: 100.0000 mg | ORAL_CAPSULE | Freq: Three times a day (TID) | ORAL | 0 refills | Status: DC | PRN

## 2021-09-29 MED ORDER — MOLNUPIRAVIR EUA 200MG CAPSULE: 4.0000 | ORAL_CAPSULE | Freq: Two times a day (BID) | ORAL | 0 refills | Status: AC

## 2021-09-29 NOTE — Patient Instructions (Signed)

## 2021-09-29 NOTE — Progress Notes (Signed)
Subjective:    Patient ID: [name], female    DOB: 13-Feb-1946, 75 y.o.   MRN: [id number]   Chief Complaint: covid positive   HPI Patient says she developed a bad headache wednesday. Thought she was getting a sinus infection. Since then she has  developed a cough and congestion and not able to taste or smell. Cough is productive. Tested positive for covid on Sunday.     Review of Systems  Constitutional:  Positive for chills, fatigue and fever.  HENT:  Positive for congestion, rhinorrhea and sore throat. Negative for sinus pressure and sinus pain.   Respiratory:  Positive for cough and shortness of breath (has been using her oxygen at home).   Cardiovascular:  Negative for chest pain.  Musculoskeletal:  Negative for myalgias.  Neurological:  Positive for headaches.      Objective:   Physical Exam Vitals reviewed.  Constitutional:      Appearance: Normal appearance.  Cardiovascular:     Rate and Rhythm: Normal rate and regular rhythm.     Heart sounds: Normal heart sounds.  Pulmonary:     Effort: Pulmonary effort is normal.     Breath sounds: Wheezing and rhonchi present.  Skin:    General: Skin is warm.  Neurological:     General: No focal deficit present.     Mental Status: She is alert and oriented to person, place, and time.  Psychiatric:        Mood and Affect: Mood normal.        Behavior: Behavior normal.   BP 136/80    Pulse 82    Temp 99.6 F (37.6 C) (Oral)    Ht 5\' 5"  (1.651 m)    Wt 190 lb (86.2 kg)    BMI 31.62 kg/m            Assessment & Plan:  [name] in today with chief complaint of No chief complaint on file.   1. Lab test positive for detection of COVID-19 virus 1. Take meds as prescribed 2. Use a cool mist humidifier especially during the winter months and when heat has been humid. 3. Use saline nose sprays frequently 4. Saline irrigations of the nose can be very helpful if done frequently.  * 4X daily for 1  week*  * Use of a nettie pot can be helpful with this. Follow directions with this* 5. Drink plenty of fluids 6. Keep thermostat turn down low 7.For any cough or congestion- tessalon perles 8. For fever or aces or pains- take tylenol or ibuprofen appropriate for age and weight.  * for fevers greater than 101 orally you may alternate ibuprofen and tylenol every  3 hours.   Meds ordered this encounter  Medications   molnupiravir EUA (LAGEVRIO) 200 mg CAPS capsule    Sig: Take 4 capsules (800 mg total) by mouth 2 (two) times daily for 5 days.    Dispense:  40 capsule    Refill:  0    Order Specific Question:   Supervising Provider    Answer:   Caryl Pina A [[id number]]   benzonatate (TESSALON PERLES) 100 MG capsule    Sig: Take 1 capsule (100 mg total) by mouth 3 (three) times daily as needed for cough.    Dispense:  20 capsule    Refill:  0    Order Specific Question:   Supervising Provider    Answer:   Caryl Pina A [id number]  The above assessment and management plan was discussed with the patient. The patient verbalized understanding of and has agreed to the management plan. Patient is aware to call the clinic if symptoms persist or worsen. Patient is aware when to return to the clinic for a follow-up visit. Patient educated on when it is appropriate to go to the emergency department.   [name], FNP

## 2021-10-01 DIAGNOSIS — J1282 Pneumonia due to coronavirus disease 2019: Secondary | ICD-10-CM | POA: Diagnosis not present

## 2021-10-01 DIAGNOSIS — J449 Chronic obstructive pulmonary disease, unspecified: Secondary | ICD-10-CM | POA: Diagnosis not present

## 2021-10-01 DIAGNOSIS — R059 Cough, unspecified: Secondary | ICD-10-CM | POA: Diagnosis not present

## 2021-10-01 DIAGNOSIS — I10 Essential (primary) hypertension: Secondary | ICD-10-CM | POA: Diagnosis not present

## 2021-10-01 DIAGNOSIS — I517 Cardiomegaly: Secondary | ICD-10-CM | POA: Diagnosis not present

## 2021-10-01 DIAGNOSIS — F17210 Nicotine dependence, cigarettes, uncomplicated: Secondary | ICD-10-CM | POA: Diagnosis not present

## 2021-10-01 DIAGNOSIS — U071 COVID-19: Secondary | ICD-10-CM | POA: Diagnosis not present

## 2021-10-01 DIAGNOSIS — R0602 Shortness of breath: Secondary | ICD-10-CM | POA: Diagnosis not present

## 2021-10-01 DIAGNOSIS — Z720 Tobacco use: Secondary | ICD-10-CM | POA: Diagnosis not present

## 2021-10-02 DIAGNOSIS — J1282 Pneumonia due to coronavirus disease 2019: Secondary | ICD-10-CM | POA: Diagnosis not present

## 2021-10-02 DIAGNOSIS — U071 COVID-19: Secondary | ICD-10-CM | POA: Diagnosis not present

## 2021-10-02 DIAGNOSIS — J449 Chronic obstructive pulmonary disease, unspecified: Secondary | ICD-10-CM | POA: Diagnosis not present

## 2021-10-04 ENCOUNTER — Other Ambulatory Visit: Payer: Self-pay | Admitting: Nurse Practitioner

## 2021-10-04 DIAGNOSIS — J209 Acute bronchitis, unspecified: Secondary | ICD-10-CM

## 2021-10-19 ENCOUNTER — Encounter: Payer: Self-pay | Admitting: Nurse Practitioner

## 2021-10-19 ENCOUNTER — Ambulatory Visit (INDEPENDENT_AMBULATORY_CARE_PROVIDER_SITE_OTHER): Payer: Medicare Other | Admitting: Nurse Practitioner

## 2021-10-19 VITALS — BP 130/84 | HR 89 | Temp 97.2°F | Resp 20 | Ht 65.0 in | Wt 190.0 lb

## 2021-10-19 DIAGNOSIS — K580 Irritable bowel syndrome with diarrhea: Secondary | ICD-10-CM | POA: Diagnosis not present

## 2021-10-19 DIAGNOSIS — M545 Low back pain, unspecified: Secondary | ICD-10-CM | POA: Diagnosis not present

## 2021-10-19 DIAGNOSIS — E785 Hyperlipidemia, unspecified: Secondary | ICD-10-CM

## 2021-10-19 DIAGNOSIS — J449 Chronic obstructive pulmonary disease, unspecified: Secondary | ICD-10-CM

## 2021-10-19 DIAGNOSIS — K219 Gastro-esophageal reflux disease without esophagitis: Secondary | ICD-10-CM | POA: Diagnosis not present

## 2021-10-19 DIAGNOSIS — K529 Noninfective gastroenteritis and colitis, unspecified: Secondary | ICD-10-CM

## 2021-10-19 DIAGNOSIS — E876 Hypokalemia: Secondary | ICD-10-CM

## 2021-10-19 DIAGNOSIS — Z6832 Body mass index (BMI) 32.0-32.9, adult: Secondary | ICD-10-CM

## 2021-10-19 DIAGNOSIS — I700 Atherosclerosis of aorta: Secondary | ICD-10-CM

## 2021-10-19 DIAGNOSIS — I10 Essential (primary) hypertension: Secondary | ICD-10-CM | POA: Diagnosis not present

## 2021-10-19 DIAGNOSIS — F17210 Nicotine dependence, cigarettes, uncomplicated: Secondary | ICD-10-CM | POA: Diagnosis not present

## 2021-10-19 DIAGNOSIS — F419 Anxiety disorder, unspecified: Secondary | ICD-10-CM

## 2021-10-19 DIAGNOSIS — F3342 Major depressive disorder, recurrent, in full remission: Secondary | ICD-10-CM

## 2021-10-19 DIAGNOSIS — G8929 Other chronic pain: Secondary | ICD-10-CM

## 2021-10-19 MED ORDER — BUDESONIDE-FORMOTEROL FUMARATE 80-4.5 MCG/ACT IN AERO: INHALATION_SPRAY | RESPIRATORY_TRACT | 12 refills | Status: DC

## 2021-10-19 MED ORDER — PAROXETINE HCL 40 MG PO TABS: 40.0000 mg | ORAL_TABLET | ORAL | 1 refills | Status: DC

## 2021-10-19 MED ORDER — ALPRAZOLAM 0.5 MG PO TABS: 0.5000 mg | ORAL_TABLET | Freq: Two times a day (BID) | ORAL | 3 refills | Status: DC

## 2021-10-19 MED ORDER — HYDROCODONE-ACETAMINOPHEN 10-325 MG PO TABS: 1.0000 | ORAL_TABLET | Freq: Three times a day (TID) | ORAL | 0 refills | Status: DC

## 2021-10-19 MED ORDER — PANTOPRAZOLE SODIUM 40 MG PO TBEC: 40.0000 mg | DELAYED_RELEASE_TABLET | Freq: Two times a day (BID) | ORAL | 1 refills | Status: DC

## 2021-10-19 MED ORDER — DICYCLOMINE HCL 10 MG PO CAPS: 10.0000 mg | ORAL_CAPSULE | Freq: Four times a day (QID) | ORAL | 0 refills | Status: DC | PRN

## 2021-10-19 NOTE — Patient Instructions (Signed)
Managing the Challenge of Quitting Smoking ?Quitting smoking is a physical and mental challenge. You will face cravings, withdrawal symptoms, and temptation. Before quitting, work with your health care provider to make a plan that can help you manage quitting. Preparation can help you quit and keep you from giving in. ?How to manage lifestyle changes ?Managing stress ?Stress can make you want to smoke, and wanting to smoke may cause stress. It is important to find ways to manage your stress. You might try some of the following: ?Practice relaxation techniques. ?Breathe slowly and deeply, in through your nose and out through your mouth. ?Listen to music. ?Soak in a bath or take a shower. ?Imagine a peaceful place or vacation. ?Get some support. ?Talk with family or friends about your stress. ?Join a support group. ?Talk with a counselor or therapist. ?Get some physical activity. ?Go for a walk, run, or bike ride. ?Play a favorite sport. ?Practice yoga. ? ?Medicines ?Talk with your health care provider about medicines that might help you deal with cravings and make quitting easier for you. ?Relationships ?Social situations can be difficult when you are quitting smoking. To manage this, you can: ?Avoid parties and other social situations where people might be smoking. ?Avoid alcohol. ?Leave right away if you have the urge to smoke. ?Explain to your family and friends that you are quitting smoking. Ask for support and let them know you might be a bit grumpy. ?Plan activities where smoking is not an option. ?General instructions ?Be aware that many people gain weight after they quit smoking. However, not everyone does. To keep from gaining weight, have a plan in place before you quit and stick to the plan after you quit. Your plan should include: ?Having healthy snacks. When you have a craving, it may help to: ?Eat popcorn, carrots, celery, or other cut vegetables. ?Chew sugar-free gum. ?Changing how you eat. ?Eat small  portion sizes at meals. ?Eat 4-6 small meals throughout the day instead of 1-2 large meals a day. ?Be mindful when you eat. Do not watch television or do other things that might distract you as you eat. ?Exercising regularly. ?Make time to exercise each day. If you do not have time for a long workout, do short bouts of exercise for 5-10 minutes several times a day. ?Do some form of strengthening exercise, such as weight lifting. ?Do some exercise that gets your heart beating and causes you to breathe deeply, such as walking fast, running, swimming, or biking. This is very important. ?Drinking plenty of water or other low-calorie or no-calorie drinks. Drink 6-8 glasses of water daily. ? ?How to recognize withdrawal symptoms ?Your body and mind may experience discomfort as you try to get used to not having nicotine in your system. These effects are called withdrawal symptoms. They may include: ?Feeling hungrier than normal. ?Having trouble concentrating. ?Feeling irritable or restless. ?Having trouble sleeping. ?Feeling depressed. ?Craving a cigarette. ?To manage withdrawal symptoms: ?Avoid places, people, and activities that trigger your cravings. ?Remember why you want to quit. ?Get plenty of sleep. ?Avoid coffee and other caffeinated drinks. These may worsen some of your symptoms. ?These symptoms may surprise you. But be assured that they are normal to have when quitting smoking. ?How to manage cravings ?Come up with a plan for how to deal with your cravings. The plan should include the following: ?A definition of the specific situation you want to deal with. ?An alternative action you will take. ?A clear idea for how this action   will help. ?The name of someone who might help you with this. ?Cravings usually last for 5-10 minutes. Consider taking the following actions to help you with your plan to deal with cravings: ?Keep your mouth busy. ?Chew sugar-free gum. ?Suck on hard candies or a straw. ?Brush your  teeth. ?Keep your hands and body busy. ?Change to a different activity right away. ?Squeeze or play with a ball. ?Do an activity or a hobby, such as making bead jewelry, practicing needlepoint, or working with wood. ?Mix up your normal routine. ?Take a short exercise break. Go for a quick walk or run up and down stairs. ?Focus on doing something kind or helpful for someone else. ?Call a friend or family member to talk during a craving. ?Join a support group. ?Contact a quitline. ?Where to find support ?To get help or find a support group: ?Call the National Cancer Institute's Smoking Quitline: 1-800-QUIT NOW (784-8669) ?Visit the website of the Substance Abuse and Mental Health Services Administration: www.samhsa.gov ?Text QUIT to SmokefreeTXT: 478848 ?Where to find more information ?Visit these websites to find more information on quitting smoking: ?National Cancer Institute: www.smokefree.gov ?American Lung Association: www.lung.org ?American Cancer Society: www.cancer.org ?Centers for Disease Control and Prevention: www.cdc.gov ?American Heart Association: www.heart.org ?Contact a health care provider if: ?You want to change your plan for quitting. ?The medicines you are taking are not helping. ?Your eating feels out of control or you cannot sleep. ?Get help right away if: ?You feel depressed or become very anxious. ?Summary ?Quitting smoking is a physical and mental challenge. You will face cravings, withdrawal symptoms, and temptation to smoke again. Preparation can help you as you go through these challenges. ?Try different techniques to manage stress, handle social situations, and prevent weight gain. ?You can deal with cravings by keeping your mouth busy (such as by chewing gum), keeping your hands and body busy, calling family or friends, or contacting a quitline for people who want to quit smoking. ?You can deal with withdrawal symptoms by avoiding places where people smoke, getting plenty of rest, and  avoiding drinks with caffeine. ?This information is not intended to replace advice given to you by your health care provider. Make sure you discuss any questions you have with your health care provider. ?Document Revised: 05/28/2021 Document Reviewed: 07/09/2019 ?Elsevier Patient Education ? 2022 [employer] ? ?

## 2021-10-19 NOTE — Progress Notes (Signed)
Subjective:    Patient ID: [name], female    DOB: 05-Dec-1945, 76 y.o.   MRN: [id number]   Chief Complaint: medical management of chronic issues     HPI:  [name] is a 76 y.o. who identifies as a female who was assigned female at birth.   Social history: Lives with: by herself- she has kids that call her several times a week Work history: disability   Comes in today for follow up of the following chronic medical issues:  1. Benign hypertension No c/o chest pain, sob or headache. Does not check blood pressure at home. BP Readings from Last 3 Encounters:  09/29/21 136/80  08/02/21 140/88  07/01/21 109/84     2. Aortic atherosclerosis ([place]) Seen on chest xray- has not sen cardiology in awhile.  3. Irritable bowel syndrome with diarrhea Has mainly diarrhea that she takes bentyl for.  4. Gastroesophageal reflux disease, unspecified whether esophagitis present Is on protonix and is doing well.  5. Hyperlipidemia with target LDL less than 100 Does not watch diet and does no dedicated exercise. Lab Results  Component Value Date   CHOL 207 (H) 06/29/2021   HDL 69 06/29/2021   LDLCALC 113 (H) 06/29/2021   TRIG 142 06/29/2021   CHOLHDL 3.0 06/29/2021   The 10-year ASCVD risk score (Arnett DK, et al., 2019) is: 27.9%   6. Hypokalemia Lab Results  Component Value Date   K 4.1 06/29/2021     7. Recurrent major depressive disorder, in full remission ([place]) Is on paxil and has been for many years. Depression screen [facility] 2/9 10/19/2021 06/29/2021 05/25/2021  Decreased Interest '2 2 1  ' Down, Depressed, Hopeless 0 1 1  PHQ - 2 Score '2 3 2  ' Altered sleeping '1 1 2  ' Tired, decreased energy '1 1 1  ' Change in appetite 2 0 0  Feeling bad or failure about yourself  0 1 0  Trouble concentrating 0 0 1  Moving slowly or fidgety/restless 0 0 0  Suicidal thoughts 0 0 0  PHQ-9 Score '6 6 6  ' Difficult doing work/chores Not difficult at all Somewhat difficult  Somewhat difficult  Some recent data might be hidden     8. Anxiety Is on xanax BID. I have tried to stop her xanax because she is on pain medication but she cannot go without it. She gets to anxious GAD 7 : Generalized Anxiety Score 10/19/2021 06/29/2021 04/02/2021 01/05/2021  Nervous, Anxious, on Edge '2 3 1 2  ' Control/stop worrying '2 3 1 2  ' Worry too much - different things '2 2 1 2  ' Trouble relaxing 0 3 1 0  Restless 0 '2 1 1  ' Easily annoyed or irritable 0 '1 1 1  ' Afraid - awful might happen 0 1 1 0  Total GAD 7 Score '6 15 7 8  ' Anxiety Difficulty Not difficult at all Very difficult Not difficult at all Somewhat difficult      9. Chronic midline low back pain without sciatica Pain assessment: Cause of pain- spondylosis Pain location- lower back Pain on scale of 1-10- 5/10 Frequency- daily What increases pain-to much activity What makes pain Better-rest helps Effects on ADL - none Any change in general medical condition-none  Current opioids rx- norco 10/325 TID # meds rx- 90 Effectiveness of current meds-helps Adverse reactions from pain meds-none Morphine equivalent- 30MME  Pill count performed-No Last drug screen - 07/02/20 ( high risk q41m moderate risk q649mlow risk yearly )  Urine drug screen today- Yes Was the [place] reviewed- yes  If yes were their any concerning findings? - no   Overdose risk: 1  Pain contract signed on:01/08/21   10. Cigarette smoker Over a pack a day. Says she stays to anxious to stop smoking.  11. BMI 32.0-32.9,adult Wt Readings from Last 3 Encounters:  10/19/21 190 lb (86.2 kg)  09/29/21 190 lb (86.2 kg)  08/02/21 197 lb 1.3 oz (89.4 kg)   BMI Readings from Last 3 Encounters:  10/19/21 31.62 kg/m  09/29/21 31.62 kg/m  08/02/21 32.80 kg/m      New complaints: None today  Allergies  Allergen Reactions   Meloxicam Other (See Comments)    Stomach ulcers   Outpatient Encounter Medications as of 10/19/2021  Medication Sig    albuterol (VENTOLIN HFA) 108 (90 Base) MCG/ACT inhaler INHALE 2 PUFFS BY MOUTH EVERY 6 HOURS AS NEEDED FOR WHEEZING OR SHORTNESS OF BREATH   ALPRAZolam (XANAX) 0.5 MG tablet Take 1 tablet (0.5 mg total) by mouth 2 (two) times daily.   Aspirin-Acetaminophen-Caffeine (GOODYS EXTRA STRENGTH) 500-325-65 MG PACK Take 1 packet by mouth 3 (three) times daily as needed (pain.).   benzonatate (TESSALON PERLES) 100 MG capsule Take 1 capsule (100 mg total) by mouth 3 (three) times daily as needed for cough.   budesonide-formoterol (SYMBICORT) 80-4.5 MCG/ACT inhaler Take 2 puffs first thing in am and then another 2 puffs about 12 hours later.   Calcium Carb-Cholecalciferol (CALCIUM+D3 PO) Take 1 tablet by mouth 3 (three) times a week.   CLENPIQ 10-3.5-12 MG-GM -GM/160ML SOLN Take 320 mLs by mouth as directed.   dicyclomine (BENTYL) 10 MG capsule Take 1 capsule (10 mg total) by mouth 4 (four) times daily as needed for spasms.   diphenhydrAMINE (BENADRYL) 25 MG tablet Take 25 mg by mouth in the morning and at bedtime.   doxylamine, Sleep, (UNISOM) 25 MG tablet Take 25 mg by mouth at bedtime as needed for sleep.   fluticasone (FLONASE) 50 MCG/ACT nasal spray SPRAY 2 SPRAYS INTO EACH NOSTRIL EVERY DAY (Patient taking differently: Place 1 spray into both nostrils daily as needed for allergies.)   Homeopathic Products (LEG CRAMPS) TABS Take 1-2 tablets by mouth daily as needed (leg cramps).   HYDROcodone-acetaminophen (NORCO) 10-325 MG tablet Take 1 tablet by mouth in the morning, at noon, and at bedtime.   HYDROcodone-acetaminophen (NORCO) 10-325 MG tablet Take 1 tablet by mouth in the morning, at noon, and at bedtime.   HYDROcodone-acetaminophen (NORCO) 10-325 MG tablet Take 1 tablet by mouth in the morning, at noon, and at bedtime.   ipratropium (ATROVENT) 0.02 % nebulizer solution USE 1 VIAL BY NEBULIZATION EVERY 6 (SIX) HOURS AS NEEDED FOR WHEEZING OR SHORTNESS OF BREATH.   pantoprazole (PROTONIX) 40 MG tablet  Take 1 tablet (40 mg total) by mouth 2 (two) times daily.   PARoxetine (PAXIL) 20 MG tablet Take 1 tablet (20 mg total) by mouth daily.   Phenazopyridine HCl (AZO DINE PO) Take 1-2 tablets by mouth 3 (three) times daily as needed (urinary pain/discomfort).   Polyethyl Glycol-Propyl Glycol (LUBRICANT EYE DROPS) 0.4-0.3 % SOLN Place 1-2 drops into both eyes 3 (three) times daily as needed (dry/irritated eyes.).   valsartan-hydrochlorothiazide (DIOVAN-HCT) 160-12.5 MG tablet TAKE 1 TABLET BY MOUTH EVERY DAY   Facility-Administered Encounter Medications as of 10/19/2021  Medication   acyclovir ointment (ZOVIRAX) 5 %    Past Surgical History:  Procedure Laterality Date   APPENDECTOMY     76  years old   BACK SURGERY     X 2   BIOPSY  08/11/2016   Procedure: BIOPSY;  Surgeon: Daneil Dolin, MD;  [facility];  Service: Endoscopy;;  gastric bx's   BIOPSY  07/01/2021   Procedure: BIOPSY;  Surgeon: Daneil Dolin, MD;  [facility];  Service: Endoscopy;;   BREAST SURGERY     biopsy only   CARPAL TUNNEL RELEASE     right hand   CERVICAL BIOPSY  W/ LOOP ELECTRODE EXCISION  02/2011   CIN-2 with focal ectocervical margin involvement. With CIN-1 ECC negative   CHOLECYSTECTOMY  1984   COLONOSCOPY WITH PROPOFOL N/A 07/01/2021   Procedure: COLONOSCOPY WITH PROPOFOL;  Surgeon: Daneil Dolin, MD;  [facility];  Service: Endoscopy;  Laterality: N/A;  1:00pm   ESOPHAGOGASTRODUODENOSCOPY  12/01/2011   Hiatal hernia/Mild fibrotic pyloric stenosis status post dilation with passage of  the scope.  The previously noted gastric ulcer completely healed  The remainder of the gastric mucosa appeared normal   ESOPHAGOGASTRODUODENOSCOPY (EGD) WITH PROPOFOL N/A 08/11/2016   Procedure: ESOPHAGOGASTRODUODENOSCOPY (EGD) WITH PROPOFOL;  Surgeon: Daneil Dolin, MD;  [facility];  Service: Endoscopy;  Laterality: N/A;  2:15 PM   ESOPHAGOGASTRODUODENOSCOPY (EGD) WITH PROPOFOL  N/A 07/01/2021   Procedure: ESOPHAGOGASTRODUODENOSCOPY (EGD) WITH PROPOFOL;  Surgeon: Daneil Dolin, MD;  [facility];  Service: Endoscopy;  Laterality: N/A;   FRACTURE SURGERY  10/18/2016   left knee in 3 places   [place]  07/28/2011   Schatzki's ring s/p 68F with small UES tear as well, small hh, 1cm pyloric channel ulcer, bx benign without H.Pylori. Mobic/goody's at time.   MALONEY DILATION N/A 08/11/2016   Procedure: Venia Minks DILATION;  Surgeon: Daneil Dolin, MD;  [facility];  Service: Endoscopy;  Laterality: N/A;   MALONEY DILATION N/A 07/01/2021   Procedure: Venia Minks DILATION;  Surgeon: Daneil Dolin, MD;  [facility];  Service: Endoscopy;  Laterality: N/A;   ORIF PATELLA Left 10/19/2016   Procedure: OPEN REDUCTION INTERNAL (ORIF) FIXATION PATELLA;  Surgeon: Carole Civil, MD;  Location: [facility];  Service: Orthopedics;  Laterality: Left;   POLYPECTOMY  07/01/2021   Procedure: POLYPECTOMY;  Surgeon: Daneil Dolin, MD;  [facility];  Service: Endoscopy;;   SPINE SURGERY  2009   cervical disc   SPINE SURGERY  2009   lumbar   TUBAL LIGATION  1976   tumor removed from arm      Family History  Problem Relation Age of Onset   Heart disease Father        CHF   Heart disease Mother    COPD Sister    Diabetes Sister    Neuropathy Sister    COPD Sister    COPD Sister    Cancer Sister    Stroke Brother    Heart disease Brother    Colon cancer Neg Hx    Anesthesia problems Neg Hx    Hypotension Neg Hx    Malignant hyperthermia Neg Hx    Pseudochol deficiency Neg Hx    Colon polyps Neg Hx        Review of Systems  Constitutional:  Negative for diaphoresis.  Eyes:  Negative for pain.  Respiratory:  Negative for shortness of breath.   Cardiovascular:  Negative for chest pain, palpitations and leg swelling.  Gastrointestinal:  Negative for abdominal pain.  Endocrine: Negative for polydipsia.  Skin:  Negative for  rash.  Neurological:  Negative for dizziness, weakness and headaches.  Hematological:  Does not bruise/bleed easily.  All other systems reviewed and are negative.     Objective:   Physical Exam Vitals and nursing note reviewed.  Constitutional:      General: She is not in acute distress.    Appearance: Normal appearance. She is well-developed.  HENT:     Head: Normocephalic.     Right Ear: Tympanic membrane normal.     Left Ear: Tympanic membrane normal.     Nose: Nose normal.     Mouth/Throat:     Mouth: Mucous membranes are moist.  Eyes:     Pupils: Pupils are equal, round, and reactive to light.  Neck:     Vascular: No carotid bruit or JVD.  Cardiovascular:     Rate and Rhythm: Normal rate and regular rhythm.     Heart sounds: Normal heart sounds.  Pulmonary:     Effort: Pulmonary effort is normal. No respiratory distress.     Breath sounds: Normal breath sounds. No wheezing or rales.     Comments: Deep wet cough Chest:     Chest wall: No tenderness.  Abdominal:     General: Bowel sounds are normal. There is no distension or abdominal bruit.     Palpations: Abdomen is soft. There is no hepatomegaly, splenomegaly, mass or pulsatile mass.     Tenderness: There is no abdominal tenderness.  Musculoskeletal:        General: Normal range of motion.     Cervical back: Normal range of motion and neck supple.  Lymphadenopathy:     Cervical: No cervical adenopathy.  Skin:    General: Skin is warm and dry.  Neurological:     Mental Status: She is alert and oriented to person, place, and time.     Deep Tendon Reflexes: Reflexes are normal and symmetric.  Psychiatric:        Behavior: Behavior normal.        Thought Content: Thought content normal.        Judgment: Judgment normal.     BP 130/84    Pulse 89    Temp (!) 97.2 F (36.2 C) (Temporal)    Resp 20    Ht '5\' 5"'  (1.651 m)    Wt 190 lb (86.2 kg)    SpO2 92%    BMI 31.62 kg/m       Assessment & Plan:  NINI CAVAN comes in today with chief complaint of Medical Management of Chronic Issues   Diagnosis and orders addressed:  1. Benign hypertension Low sodium diet - CBC with Differential/Platelet - CMP14+EGFR  2. Aortic atherosclerosis ([place])  3. Irritable bowel syndrome with diarrhea Watch diet to prevent flare up - dicyclomine (BENTYL) 10 MG capsule; Take 1 capsule (10 mg total) by mouth 4 (four) times daily as needed for spasms.  Dispense: 360 capsule; Refill: 0   4. Gastroesophageal reflux disease, unspecified whether esophagitis present Avoid spicy foods Do not eat 2 hours prior to bedtime - pantoprazole (PROTONIX) 40 MG tablet; Take 1 tablet (40 mg total) by mouth 2 (two) times daily.  Dispense: 180 tablet; Refill: 1  5. Hyperlipidemia with target LDL less than 100 Ow fat diet - Lipid panel  6. Hypokalemia Labs pending  7. Recurrent major depressive disorder, in full remission ([place]) Stress management - PARoxetine (PAXIL) 40 MG tablet; Take 1 tablet (40 mg total) by mouth  every morning.  Dispense: 90 tablet; Refill: 1  8. Anxiety - ALPRAZolam (XANAX) 0.5 MG tablet; Take 1 tablet (0.5 mg total) by mouth 2 (two) times daily.  Dispense: 60 tablet; Refill: 3  9. Chronic midline low back pain without sciatica Moist heat rest - HYDROcodone-acetaminophen (NORCO) 10-325 MG tablet; Take 1 tablet by mouth in the morning, at noon, and at bedtime.  Dispense: 90 tablet; Refill: 0 - HYDROcodone-acetaminophen (NORCO) 10-325 MG tablet; Take 1 tablet by mouth in the morning, at noon, and at bedtime.  Dispense: 90 tablet; Refill: 0 - HYDROcodone-acetaminophen (NORCO) 10-325 MG tablet; Take 1 tablet by mouth in the morning, at noon, and at bedtime.  Dispense: 90 tablet; Refill: 0  10. Cigarette smoker Cigarette smoking encouraged  11. BMI 32.0-32.9,adult Discussed diet and exercise for person with BMI >25 Will recheck weight in 3-6 months   12. COPD GOLD ? / active smoker  Use oxygen  as needed - budesonide-formoterol (SYMBICORT) 80-4.5 MCG/ACT inhaler; Take 2 puffs first thing in am and then another 2 puffs about 12 hours later.  Dispense: 1 each; Refill: 12   Labs pending Health Maintenance reviewed Diet and exercise encouraged  Follow up plan: 3 months -pain management   [name], FNP

## 2021-10-20 LAB — CMP14+EGFR
ALT: 11 IU/L (ref 0–32)
AST: 14 IU/L (ref 0–40)
Albumin/Globulin Ratio: 1.7 (ref 1.2–2.2)
Albumin: 4 g/dL (ref 3.7–4.7)
Alkaline Phosphatase: 158 IU/L — ABNORMAL HIGH (ref 44–121)
BUN/Creatinine Ratio: 7 — ABNORMAL LOW (ref 12–28)
BUN: 4 mg/dL — ABNORMAL LOW (ref 8–27)
Bilirubin Total: 0.5 mg/dL (ref 0.0–1.2)
CO2: 27 mmol/L (ref 20–29)
Calcium: 9.6 mg/dL (ref 8.7–10.3)
Chloride: 98 mmol/L (ref 96–106)
Creatinine, Ser: 0.59 mg/dL (ref 0.57–1.00)
Globulin, Total: 2.4 g/dL (ref 1.5–4.5)
Glucose: 86 mg/dL (ref 70–99)
Potassium: 4.6 mmol/L (ref 3.5–5.2)
Sodium: 138 mmol/L (ref 134–144)
Total Protein: 6.4 g/dL (ref 6.0–8.5)
eGFR: 95 mL/min/{1.73_m2} (ref >59)

## 2021-10-20 LAB — CBC WITH DIFFERENTIAL/PLATELET
Basophils Absolute: 0.2 10*3/uL (ref 0.0–0.2)
Basos: 2 %
EOS (ABSOLUTE): 0.3 10*3/uL (ref 0.0–0.4)
Eos: 4 %
Hematocrit: 45.8 % (ref 34.0–46.6)
Hemoglobin: 14.7 g/dL (ref 11.1–15.9)
Immature Grans (Abs): 0 10*3/uL (ref 0.0–0.1)
Immature Granulocytes: 1 %
Lymphocytes Absolute: 2.2 10*3/uL (ref 0.7–3.1)
Lymphs: 29 %
MCH: 27.7 pg (ref 26.6–33.0)
MCHC: 32.1 g/dL (ref 31.5–35.7)
MCV: 86 fL (ref 79–97)
Monocytes Absolute: 0.7 10*3/uL (ref 0.1–0.9)
Monocytes: 10 %
Neutrophils Absolute: 4.1 10*3/uL (ref 1.4–7.0)
Neutrophils: 54 %
Platelets: 286 10*3/uL (ref 150–450)
RBC: 5.3 x10E6/uL — ABNORMAL HIGH (ref 3.77–5.28)
RDW: 13 % (ref 11.7–15.4)
WBC: 7.5 10*3/uL (ref 3.4–10.8)

## 2021-10-20 LAB — LIPID PANEL
Chol/HDL Ratio: 3.2 ratio (ref 0.0–4.4)
Cholesterol, Total: 199 mg/dL (ref 100–199)
HDL: 63 mg/dL (ref >39)
LDL Chol Calc (NIH): 110 mg/dL — ABNORMAL HIGH (ref 0–99)
Triglycerides: 151 mg/dL — ABNORMAL HIGH (ref 0–149)
VLDL Cholesterol Cal: 26 mg/dL (ref 5–40)

## 2021-10-23 DIAGNOSIS — J441 Chronic obstructive pulmonary disease with (acute) exacerbation: Secondary | ICD-10-CM | POA: Diagnosis not present

## 2021-10-27 ENCOUNTER — Other Ambulatory Visit: Payer: Self-pay | Admitting: *Deleted

## 2021-10-27 DIAGNOSIS — J449 Chronic obstructive pulmonary disease, unspecified: Secondary | ICD-10-CM

## 2021-10-27 MED ORDER — SYMBICORT 80-4.5 MCG/ACT IN AERO: INHALATION_SPRAY | RESPIRATORY_TRACT | 12 refills | Status: DC

## 2021-11-23 DIAGNOSIS — J441 Chronic obstructive pulmonary disease with (acute) exacerbation: Secondary | ICD-10-CM | POA: Diagnosis not present

## 2021-12-16 ENCOUNTER — Other Ambulatory Visit: Payer: Self-pay | Admitting: Nurse Practitioner

## 2022-01-12 ENCOUNTER — Other Ambulatory Visit: Payer: Self-pay | Admitting: Nurse Practitioner

## 2022-01-12 DIAGNOSIS — J209 Acute bronchitis, unspecified: Secondary | ICD-10-CM

## 2022-01-18 ENCOUNTER — Ambulatory Visit (INDEPENDENT_AMBULATORY_CARE_PROVIDER_SITE_OTHER): Payer: Medicare Other | Admitting: Nurse Practitioner

## 2022-01-18 ENCOUNTER — Encounter: Payer: Self-pay | Admitting: Nurse Practitioner

## 2022-01-18 VITALS — BP 118/76 | HR 96 | Temp 97.5°F | Resp 20 | Ht 65.0 in | Wt 187.0 lb

## 2022-01-18 DIAGNOSIS — G8929 Other chronic pain: Secondary | ICD-10-CM

## 2022-01-18 DIAGNOSIS — Z79899 Other long term (current) drug therapy: Secondary | ICD-10-CM

## 2022-01-18 DIAGNOSIS — R300 Dysuria: Secondary | ICD-10-CM | POA: Diagnosis not present

## 2022-01-18 DIAGNOSIS — F419 Anxiety disorder, unspecified: Secondary | ICD-10-CM | POA: Diagnosis not present

## 2022-01-18 DIAGNOSIS — M545 Low back pain, unspecified: Secondary | ICD-10-CM

## 2022-01-18 LAB — MICROSCOPIC EXAMINATION
RBC, Urine: NONE SEEN /hpf (ref 0–2)
Renal Epithel, UA: NONE SEEN /hpf

## 2022-01-18 LAB — URINALYSIS, COMPLETE
Bilirubin, UA: NEGATIVE
Glucose, UA: NEGATIVE
Ketones, UA: NEGATIVE
Leukocytes,UA: NEGATIVE
Nitrite, UA: POSITIVE — AB
Protein,UA: NEGATIVE
RBC, UA: NEGATIVE
Specific Gravity, UA: 1.015 (ref 1.005–1.030)
Urobilinogen, Ur: 0.2 mg/dL (ref 0.2–1.0)
pH, UA: 5 (ref 5.0–7.5)

## 2022-01-18 MED ORDER — ALPRAZOLAM 0.5 MG PO TABS: 0.5000 mg | ORAL_TABLET | Freq: Two times a day (BID) | ORAL | 2 refills | Status: DC

## 2022-01-18 MED ORDER — HYDROCODONE-ACETAMINOPHEN 10-325 MG PO TABS: 1.0000 | ORAL_TABLET | Freq: Three times a day (TID) | ORAL | 0 refills | Status: DC

## 2022-01-18 NOTE — Progress Notes (Signed)
? ?Subjective:  ? ? Patient ID: [name], female    DOB: Feb 28, 1946, 76 y.o.   MRN: [id number] ? ? ?Chief Complaint: pain management ? ?HPI ? ?Patient has chroniuc back pain an dhas been on pain meds for several years. ?Pain assessment: ?Cause of pain- unknown ?Pain location- low back ?Pain on scale of 1-10- 4/10 currently ?Frequency- daily ?What increases pain-to much activity ?What makes pain Better-rest helps ?Effects on ADL - none ?Any change in general medical condition-no ? ?Current opioids rx- norco 10/325 tid ?# meds rx- 55 ?Effectiveness of current meds-helps ?Adverse reactions from pain meds-none ?Morphine equivalent- 30MME ? ?Pill count performed-No ?Last drug screen - 2021 ?( high risk q64m moderate risk q658mlow risk yearly ) ?Urine drug screen today- Yes ?[place] ? If yes were their any concerning findings? - none ? ? ?Overdose risk: 1 ? ? ? ?Pain contract signed on:01/08/21 ? ?- anxiety- she is on xanax bid. Was able to decrease her from TID. She says she cannot go without her xanax. ? ?  01/18/2022  ?  2:10 PM 10/19/2021  ?  3:28 PM 06/29/2021  ? 11:57 AM 04/02/2021  ? 12:07 PM  ?GAD 7 : Generalized Anxiety Score  ?Nervous, Anxious, on Edge '1 2 3 1  '$ ?Control/stop worrying '1 2 3 1  '$ ?Worry too much - different things '1 2 2 1  '$ ?Trouble relaxing 1 0 3 1  ?Restless 1 0 2 1  ?Easily annoyed or irritable 0 0 1 1  ?Afraid - awful might happen 1 0 1 1  ?Total GAD 7 Score '6 6 15 7  '$ ?Anxiety Difficulty Somewhat difficult Not difficult at all Very difficult Not difficult at all  ? ? ? ? ?Review of Systems  ?Constitutional:  Negative for diaphoresis.  ?Eyes:  Negative for pain.  ?Respiratory:  Negative for shortness of breath.   ?Cardiovascular:  Negative for chest pain, palpitations and leg swelling.  ?Gastrointestinal:  Negative for abdominal pain.  ?Endocrine: Negative for polydipsia.  ?Skin:  Negative for rash.  ?Neurological:  Negative for dizziness, weakness and headaches.   ?Hematological:  Does not bruise/bleed easily.  ?All other systems reviewed and are negative. ? ?   ?Objective:  ? Physical Exam ?Constitutional:   ?   Appearance: Normal appearance.  ?Cardiovascular:  ?   Rate and Rhythm: Normal rate and regular rhythm.  ?   Heart sounds: Normal heart sounds.  ?Pulmonary:  ?   Effort: Pulmonary effort is normal.  ?   Breath sounds: Normal breath sounds.  ?Musculoskeletal:  ?   Comments: Rises slowly from sitting to standing ?(-) SLR bil  ?Skin: ?   General: Skin is warm.  ?Neurological:  ?   General: No focal deficit present.  ?   Mental Status: She is alert and oriented to person, place, and time.  ?Psychiatric:     ?   Mood and Affect: Mood normal.     ?   Behavior: Behavior normal.  ? ?BP 118/76   Pulse 96   Temp (!) 97.5 ?F (36.4 ?C)   Resp 20   Ht '5\' 5"'$  (1.651 m)   Wt 187 lb (84.8 kg)   SpO2 90%   BMI 31.12 kg/m?  ? ? ?   ?Assessment & Plan:  ? ?[name] today with chief complaint of Medical Management of Chronic Issues ? ? ?1. Chronic midline low back pain without sciatica ?Moist heat ?rest ?-  HYDROcodone-acetaminophen (NORCO) 10-325 MG tablet; Take 1 tablet by mouth in the morning, at noon, and at bedtime.  Dispense: 90 tablet; Refill: 0 ?- HYDROcodone-acetaminophen (NORCO) 10-325 MG tablet; Take 1 tablet by mouth in the morning, at noon, and at bedtime.  Dispense: 90 tablet; Refill: 0 ?- HYDROcodone-acetaminophen (NORCO) 10-325 MG tablet; Take 1 tablet by mouth in the morning, at noon, and at bedtime.  Dispense: 90 tablet; Refill: 0 ? ?2. Controlled substance agreement signed ?- [employer]ox[employer] Select 13 (MW), Urine ? ?3. Dysuria ?- Urinalysis, Complete ?- Urine Culture ? ?4. Anxiety ?Stress management ?- ALPRAZolam (XANAX) 0.5 MG tablet; Take 1 tablet (0.5 mg total) by mouth 2 (two) times daily.  Dispense: 60 tablet; Refill: 2 ? ? ? ?The above assessment and management plan was discussed with the patient. The patient verbalized understanding of and has  agreed to the management plan. Patient is aware to call the clinic if symptoms persist or worsen. Patient is aware when to return to the clinic for a follow-up visit. Patient educated on when it is appropriate to go to the emergency department.  ? ?[name], FNP ? ? ? ?

## 2022-01-18 NOTE — Patient Instructions (Signed)

## 2022-01-20 LAB — URINE CULTURE

## 2022-01-20 MED ORDER — SULFAMETHOXAZOLE-TRIMETHOPRIM 800-160 MG PO TABS: 1.0000 | ORAL_TABLET | Freq: Two times a day (BID) | ORAL | 0 refills | Status: DC

## 2022-01-20 NOTE — Addendum Note (Signed)
Addended by: Chevis Pretty on: 01/20/2022 04:47 PM ? ? Modules accepted: Orders ? ?

## 2022-01-23 LAB — TOXASSURE SELECT 13 (MW), URINE

## 2022-02-09 ENCOUNTER — Ambulatory Visit: Payer: Medicare Other | Admitting: Internal Medicine

## 2022-02-09 ENCOUNTER — Other Ambulatory Visit: Payer: Self-pay | Admitting: Nurse Practitioner

## 2022-02-09 DIAGNOSIS — J209 Acute bronchitis, unspecified: Secondary | ICD-10-CM

## 2022-02-11 ENCOUNTER — Other Ambulatory Visit: Payer: Self-pay | Admitting: Nurse Practitioner

## 2022-02-11 ENCOUNTER — Ambulatory Visit: Payer: Medicare Other | Admitting: Internal Medicine

## 2022-02-11 DIAGNOSIS — K529 Noninfective gastroenteritis and colitis, unspecified: Secondary | ICD-10-CM

## 2022-02-19 ENCOUNTER — Other Ambulatory Visit: Payer: Self-pay | Admitting: Nurse Practitioner

## 2022-02-19 DIAGNOSIS — I10 Essential (primary) hypertension: Secondary | ICD-10-CM

## 2022-04-11 ENCOUNTER — Other Ambulatory Visit: Payer: Self-pay | Admitting: Nurse Practitioner

## 2022-04-11 DIAGNOSIS — J209 Acute bronchitis, unspecified: Secondary | ICD-10-CM

## 2022-04-17 ENCOUNTER — Other Ambulatory Visit: Payer: Self-pay | Admitting: Nurse Practitioner

## 2022-04-17 DIAGNOSIS — K219 Gastro-esophageal reflux disease without esophagitis: Secondary | ICD-10-CM

## 2022-04-17 DIAGNOSIS — F3342 Major depressive disorder, recurrent, in full remission: Secondary | ICD-10-CM

## 2022-04-18 ENCOUNTER — Other Ambulatory Visit: Payer: Self-pay | Admitting: Nurse Practitioner

## 2022-04-18 DIAGNOSIS — F419 Anxiety disorder, unspecified: Secondary | ICD-10-CM

## 2022-04-19 NOTE — Telephone Encounter (Signed)
Pt has appt 04/21/22-will get refills of controlled substance at appt.

## 2022-04-21 ENCOUNTER — Encounter: Payer: Self-pay | Admitting: Nurse Practitioner

## 2022-04-21 ENCOUNTER — Other Ambulatory Visit: Payer: Self-pay

## 2022-04-21 ENCOUNTER — Ambulatory Visit (INDEPENDENT_AMBULATORY_CARE_PROVIDER_SITE_OTHER): Payer: Medicare Other | Admitting: Nurse Practitioner

## 2022-04-21 VITALS — BP 138/79 | HR 88 | Temp 97.2°F | Resp 20 | Ht 65.0 in | Wt 188.0 lb

## 2022-04-21 DIAGNOSIS — E785 Hyperlipidemia, unspecified: Secondary | ICD-10-CM | POA: Diagnosis not present

## 2022-04-21 DIAGNOSIS — F3342 Major depressive disorder, recurrent, in full remission: Secondary | ICD-10-CM

## 2022-04-21 DIAGNOSIS — K580 Irritable bowel syndrome with diarrhea: Secondary | ICD-10-CM | POA: Diagnosis not present

## 2022-04-21 DIAGNOSIS — Z6832 Body mass index (BMI) 32.0-32.9, adult: Secondary | ICD-10-CM

## 2022-04-21 DIAGNOSIS — E876 Hypokalemia: Secondary | ICD-10-CM | POA: Diagnosis not present

## 2022-04-21 DIAGNOSIS — M545 Low back pain, unspecified: Secondary | ICD-10-CM

## 2022-04-21 DIAGNOSIS — F17210 Nicotine dependence, cigarettes, uncomplicated: Secondary | ICD-10-CM | POA: Diagnosis not present

## 2022-04-21 DIAGNOSIS — J449 Chronic obstructive pulmonary disease, unspecified: Secondary | ICD-10-CM

## 2022-04-21 DIAGNOSIS — R1310 Dysphagia, unspecified: Secondary | ICD-10-CM

## 2022-04-21 DIAGNOSIS — K219 Gastro-esophageal reflux disease without esophagitis: Secondary | ICD-10-CM | POA: Diagnosis not present

## 2022-04-21 DIAGNOSIS — I10 Essential (primary) hypertension: Secondary | ICD-10-CM

## 2022-04-21 DIAGNOSIS — G8929 Other chronic pain: Secondary | ICD-10-CM

## 2022-04-21 DIAGNOSIS — F419 Anxiety disorder, unspecified: Secondary | ICD-10-CM

## 2022-04-21 MED ORDER — SYMBICORT 80-4.5 MCG/ACT IN AERO: INHALATION_SPRAY | RESPIRATORY_TRACT | 12 refills | Status: DC

## 2022-04-21 MED ORDER — HYDROCODONE-ACETAMINOPHEN 10-325 MG PO TABS: 1.0000 | ORAL_TABLET | Freq: Three times a day (TID) | ORAL | 0 refills | Status: DC

## 2022-04-21 MED ORDER — FLUTICASONE PROPIONATE 50 MCG/ACT NA SUSP: 2.0000 | Freq: Every day | NASAL | 2 refills | Status: DC

## 2022-04-21 MED ORDER — DICYCLOMINE HCL 10 MG PO CAPS
10.0000 mg | ORAL_CAPSULE | Freq: Four times a day (QID) | ORAL | 0 refills | Status: DC | PRN
Start: 2022-04-21 — End: 2022-11-03

## 2022-04-21 MED ORDER — VALSARTAN-HYDROCHLOROTHIAZIDE 160-12.5 MG PO TABS: 1.0000 | ORAL_TABLET | Freq: Every day | ORAL | 1 refills | Status: DC

## 2022-04-21 MED ORDER — ALBUTEROL SULFATE HFA 108 (90 BASE) MCG/ACT IN AERS: INHALATION_SPRAY | RESPIRATORY_TRACT | 1 refills | Status: DC

## 2022-04-21 MED ORDER — ALPRAZOLAM 0.5 MG PO TABS
0.5000 mg | ORAL_TABLET | Freq: Two times a day (BID) | ORAL | 2 refills | Status: DC
Start: 2022-04-21 — End: 2022-07-05

## 2022-04-21 MED ORDER — PAROXETINE HCL 40 MG PO TABS: ORAL_TABLET | ORAL | 1 refills | Status: DC

## 2022-04-21 MED ORDER — PANTOPRAZOLE SODIUM 40 MG PO TBEC: 40.0000 mg | DELAYED_RELEASE_TABLET | Freq: Two times a day (BID) | ORAL | 0 refills | Status: DC

## 2022-04-21 NOTE — Addendum Note (Signed)
Addended by: Chevis Pretty on: 04/21/2022 02:48 PM   Modules accepted: Orders

## 2022-04-21 NOTE — Progress Notes (Signed)
Subjective:    Patient ID: [name], female    DOB: 20-Jul-1946, 76 y.o.   MRN: [id number]   Chief Complaint: Medical Management of Chronic Issues    HPI:  [name] is a 76 y.o. who identifies as a female who was assigned female at birth.   Social history: Lives with: by herself Work history: retired   Scientist, forensic in today for follow up of the following chronic medical issues:  1. Benign hypertension No c/o chest pain, sob or headache. Does not check blood pressure at home. BP Readings from Last 3 Encounters:  04/21/22 138/79  01/18/22 118/76  10/19/21 130/84     2. Hyperlipidemia with target LDL less than 100 [place]  snot watch diet and does no dedicated exercise. Lab Results  Component Value Date   CHOL 199 10/19/2021   HDL 63 10/19/2021   LDLCALC 110 (H) 10/19/2021   TRIG 151 (H) 10/19/2021   CHOLHDL 3.2 10/19/2021     3. Hypokalemia No c/o muscle cramps Lab Results  Component Value Date   K 4.6 10/19/2021     4. COPD GOLD ? / active smoker  She stays sob and wheezes. Insists on still smoking. She has not been using her nebulizer at home.  5. Dysphagia, unspecified type Only occasionally. Has had her esophagus stretched several times in the past.  6. Irritable bowel syndrome with diarrhea She has diarrhea daily. She takes bentyl and that hel[ps. She only takes a couple of times a day.  7. Gastroesophageal reflux disease, unspecified whether esophagitis present Takes protonix daily and that is working well.  8. Anxiety She feels that she is worse. She worries all the time.    04/21/2022    2:08 PM 01/18/2022    2:10 PM 10/19/2021    3:28 PM 06/29/2021   11:57 AM  GAD 7 : Generalized Anxiety Score  Nervous, Anxious, on Edge '1 1 2 3  '$ Control/stop worrying '1 1 2 3  '$ Worry too much - different things '1 1 2 2  '$ Trouble relaxing 1 1 0 3  Restless 0 1 0 2  Easily annoyed or irritable 0 0 0 1  Afraid - awful might happen 0 1 0 1  Total GAD  7 Score '4 6 6 15  '$ Anxiety Difficulty Not difficult at all Somewhat difficult Not difficult at all Very difficult      9. Recurrent major depressive disorder, in full remission ([place]) I son paxil and we increases her dose at last visit.    04/21/2022    2:07 PM 01/18/2022    2:10 PM 10/19/2021    3:28 PM  Depression screen PHQ 2/9  Decreased Interest '1 1 2  '$ Down, Depressed, Hopeless 1 0 0  PHQ - 2 Score '2 1 2  '$ Altered sleeping 1 0 1  Tired, decreased energy '1 1 1  '$ Change in appetite 0 0 2  Feeling bad or failure about yourself  0 1 0  Trouble concentrating 0 1 0  Moving slowly or fidgety/restless 0 1 0  Suicidal thoughts 0 0 0  PHQ-9 Score '4 5 6  '$ Difficult doing work/chores Somewhat difficult Somewhat difficult Not difficult at all     10. Chronic midline low back pain without sciatica Pain assessment: Cause of pain- spondylolisthesis Pain location- up and down back Pain on scale of 1-10- 8/10 Frequency- daily What increases pain-to much activity What makes pain Better-rest helps Effects on ADL - none Any change in  general medical condition-no  Current opioids rx- norco 10/325 TID # meds rx- 90 Effectiveness of current meds-helps but always has some pain Adverse reactions from pain meds-none Morphine equivalent- 30 MME  Pill count performed-No Last drug screen - 01/18/22 ( high risk q63m moderate risk q656mlow risk yearly ) Urine drug screen today- No [place]- yes  If yes were their any concerning findings? - no   Overdose risk: 1   Pain contract signed on:01/19/22   11. Cigarette smoker Smokes over a pack a day.   12. BMI 32.0-32.9,adult Wt Readings from Last 3 Encounters:  04/21/22 188 lb (85.3 kg)  01/18/22 187 lb (84.8 kg)  10/19/21 190 lb (86.2 kg)   BMI Readings from Last 3 Encounters:  04/21/22 31.28 kg/m  01/18/22 31.12 kg/m  10/19/21 31.62 kg/m      New complaints: none  Allergies  Allergen Reactions   Meloxicam  Other (See Comments)    Stomach ulcers   Outpatient Encounter Medications as of 04/21/2022  Medication Sig   albuterol (VENTOLIN HFA) 108 (90 Base) MCG/ACT inhaler INHALE 2 PUFFS BY MOUTH EVERY 6 HOURS AS NEEDED FOR WHEEZING OR SHORTNESS OF BREATH   ALPRAZolam (XANAX) 0.5 MG tablet Take 1 tablet (0.5 mg total) by mouth 2 (two) times daily.   Aspirin-Acetaminophen-Caffeine (GOODYS EXTRA STRENGTH) 500-325-65 MG PACK Take 1 packet by mouth 3 (three) times daily as needed (pain.).   Calcium Carb-Cholecalciferol (CALCIUM+D3 PO) Take 1 tablet by mouth 3 (three) times a week.   CLENPIQ 10-3.5-12 MG-GM -GM/160ML SOLN Take 320 mLs by mouth as directed.   dicyclomine (BENTYL) 10 MG capsule TAKE 1 CAPSULE (10 MG TOTAL) BY MOUTH 4 (FOUR) TIMES DAILY AS NEEDED FOR SPASMS.   diphenhydrAMINE (BENADRYL) 25 MG tablet Take 25 mg by mouth in the morning and at bedtime.   doxylamine, Sleep, (UNISOM) 25 MG tablet Take 25 mg by mouth at bedtime as needed for sleep.   fluticasone (FLONASE) 50 MCG/ACT nasal spray SPRAY 2 SPRAYS INTO EACH NOSTRIL EVERY DAY (Patient taking differently: Place 1 spray into both nostrils daily as needed for allergies.)   Homeopathic Products (LEG CRAMPS) TABS Take 1-2 tablets by mouth daily as needed (leg cramps).   HYDROcodone-acetaminophen (NORCO) 10-325 MG tablet Take 1 tablet by mouth in the morning, at noon, and at bedtime.   ipratropium (ATROVENT) 0.02 % nebulizer solution USE 1 VIAL BY NEBULIZATION EVERY 6 (SIX) HOURS AS NEEDED FOR WHEEZING OR SHORTNESS OF BREATH.   pantoprazole (PROTONIX) 40 MG tablet TAKE 1 TABLET BY MOUTH TWICE A DAY   PARoxetine (PAXIL) 40 MG tablet TAKE 1 TABLET BY MOUTH EVERY DAY IN THE MORNING   Phenazopyridine HCl (AZO DINE PO) Take 1-2 tablets by mouth 3 (three) times daily as needed (urinary pain/discomfort).   Polyethyl Glycol-Propyl Glycol (LUBRICANT EYE DROPS) 0.4-0.3 % SOLN Place 1-2 drops into both eyes 3 (three) times daily as needed (dry/irritated  eyes.).   SYMBICORT 80-4.5 MCG/ACT inhaler Take 2 puffs first thing in am and then another 2 puffs about 12 hours later.   valsartan-hydrochlorothiazide (DIOVAN-HCT) 160-12.5 MG tablet TAKE 1 TABLET BY MOUTH ONCE DAILY   HYDROcodone-acetaminophen (NORCO) 10-325 MG tablet Take 1 tablet by mouth in the morning, at noon, and at bedtime.   HYDROcodone-acetaminophen (NORCO) 10-325 MG tablet Take 1 tablet by mouth in the morning, at noon, and at bedtime.   [DISCONTINUED] benzonatate (TESSALON PERLES) 100 MG capsule Take 1 capsule (100 mg total) by mouth 3 (three)  times daily as needed for cough. (Patient not taking: Reported on 01/18/2022)   [DISCONTINUED] sulfamethoxazole-trimethoprim (BACTRIM DS) 800-160 MG tablet Take 1 tablet by mouth 2 (two) times daily.   Facility-Administered Encounter Medications as of 04/21/2022  Medication   acyclovir ointment (ZOVIRAX) 5 %    Past Surgical History:  Procedure Laterality Date   APPENDECTOMY     76 years old   BACK SURGERY     X 2   BIOPSY  08/11/2016   Procedure: BIOPSY;  Surgeon: Daneil Dolin, MD;  [facility];  Service: Endoscopy;;  gastric bx's   BIOPSY  07/01/2021   Procedure: BIOPSY;  Surgeon: Daneil Dolin, MD;  [facility];  Service: Endoscopy;;   BREAST SURGERY     biopsy only   CARPAL TUNNEL RELEASE     right hand   CERVICAL BIOPSY  W/ LOOP ELECTRODE EXCISION  02/2011   CIN-2 with focal ectocervical margin involvement. With CIN-1 ECC negative   CHOLECYSTECTOMY  1984   COLONOSCOPY WITH PROPOFOL N/A 07/01/2021   Procedure: COLONOSCOPY WITH PROPOFOL;  Surgeon: Daneil Dolin, MD;  [facility]: [facility];  Service: Endoscopy;  Laterality: N/A;  1:00pm   ESOPHAGOGASTRODUODENOSCOPY  12/01/2011   Hiatal hernia/Mild fibrotic pyloric stenosis status post dilation with passage of  the scope.  The previously noted gastric ulcer completely healed  The remainder of the gastric mucosa appeared normal    ESOPHAGOGASTRODUODENOSCOPY (EGD) WITH PROPOFOL N/A 08/11/2016   Procedure: ESOPHAGOGASTRODUODENOSCOPY (EGD) WITH PROPOFOL;  Surgeon: Daneil Dolin, MD;  [facility];  Service: Endoscopy;  Laterality: N/A;  2:15 PM   ESOPHAGOGASTRODUODENOSCOPY (EGD) WITH PROPOFOL N/A 07/01/2021   Procedure: ESOPHAGOGASTRODUODENOSCOPY (EGD) WITH PROPOFOL;  Surgeon: Daneil Dolin, MD;  [facility];  Service: Endoscopy;  Laterality: N/A;   FRACTURE SURGERY  10/18/2016   left knee in 3 places   [place]  07/28/2011   Schatzki's ring s/p 97F with small UES tear as well, small hh, 1cm pyloric channel ulcer, bx benign without H.Pylori. Mobic/goody's at time.   MALONEY DILATION N/A 08/11/2016   Procedure: Venia Minks DILATION;  Surgeon: Daneil Dolin, MD;  [facility];  Service: Endoscopy;  Laterality: N/A;   MALONEY DILATION N/A 07/01/2021   Procedure: Venia Minks DILATION;  Surgeon: Daneil Dolin, MD;  [facility];  Service: Endoscopy;  Laterality: N/A;   ORIF PATELLA Left 10/19/2016   Procedure: OPEN REDUCTION INTERNAL (ORIF) FIXATION PATELLA;  Surgeon: Carole Civil, MD;  Location: [facility];  Service: Orthopedics;  Laterality: Left;   POLYPECTOMY  07/01/2021   Procedure: POLYPECTOMY;  Surgeon: Daneil Dolin, MD;  [facility];  Service: Endoscopy;;   SPINE SURGERY  2009   cervical disc   SPINE SURGERY  2009   lumbar   TUBAL LIGATION  1976   tumor removed from arm      Family History  Problem Relation Age of Onset   Heart disease Father        CHF   Heart disease Mother    COPD Sister    Diabetes Sister    Neuropathy Sister    COPD Sister    COPD Sister    Cancer Sister    Stroke Brother    Heart disease Brother    Colon cancer Neg Hx    Anesthesia problems Neg Hx    Hypotension Neg Hx    Malignant hyperthermia Neg Hx  Pseudochol deficiency Neg Hx    Colon polyps Neg Hx        Review of Systems  Constitutional:  Negative for  diaphoresis.  Eyes:  Negative for pain.  Respiratory:  Negative for shortness of breath.   Cardiovascular:  Negative for chest pain, palpitations and leg swelling.  Gastrointestinal:  Negative for abdominal pain.  Endocrine: Negative for polydipsia.  Skin:  Negative for rash.  Neurological:  Negative for dizziness, weakness and headaches.  Hematological:  Does not bruise/bleed easily.  All other systems reviewed and are negative.      Objective:   Physical Exam Vitals and nursing note reviewed.  Constitutional:      General: She is not in acute distress.    Appearance: Normal appearance. She is well-developed.  HENT:     Head: Normocephalic.     Right Ear: Tympanic membrane normal.     Left Ear: Tympanic membrane normal.     Nose: Nose normal.     Mouth/Throat:     Mouth: Mucous membranes are moist.  Eyes:     Pupils: Pupils are equal, round, and reactive to light.  Neck:     Vascular: No carotid bruit or JVD.  Cardiovascular:     Rate and Rhythm: Normal rate and regular rhythm.     Heart sounds: Normal heart sounds.  Pulmonary:     Effort: Pulmonary effort is normal. No respiratory distress.     Breath sounds: Wheezing (insp and exp thorughout bil) present. No rales.  Chest:     Chest wall: No tenderness.  Abdominal:     General: Bowel sounds are normal. There is no distension or abdominal bruit.     Palpations: Abdomen is soft. There is no hepatomegaly, splenomegaly, mass or pulsatile mass.     Tenderness: There is no abdominal tenderness.  Musculoskeletal:        General: Normal range of motion.     Cervical back: Normal range of motion and neck supple.  Lymphadenopathy:     Cervical: No cervical adenopathy.  Skin:    General: Skin is warm and dry.  Neurological:     Mental Status: She is alert and oriented to person, place, and time.     Deep Tendon Reflexes: Reflexes are normal and symmetric.  Psychiatric:        Behavior: Behavior normal.        Thought  Content: Thought content normal.        Judgment: Judgment normal.    BP 138/79   Pulse 88   Temp (!) 97.2 F (36.2 C) (Temporal)   Resp 20   Ht '5\' 5"'$  (1.651 m)   Wt 188 lb (85.3 kg)   SpO2 90%   BMI 31.28 kg/m         Assessment & Plan:   NATALINE BASARA comes in today with chief complaint of Medical Management of Chronic Issues   Diagnosis and orders addressed:  1. Benign hypertension Low sodium diet - valsartan-hydrochlorothiazide (DIOVAN-HCT) 160-12.5 MG tablet; Take 1 tablet by mouth daily.  Dispense: 90 tablet; Refill: 1  2. Hyperlipidemia with target LDL less than 100 Low fat diet  3. Hypokalemia Labs pending  4. COPD GOLD ? / active smoker  Use nebulizer daily - albuterol (VENTOLIN HFA) 108 (90 Base) MCG/ACT inhaler; 2 puffs q6 prn  Dispense: 8.5 each; Refill: 1 - fluticasone (FLONASE) 50 MCG/ACT nasal spray; Place 2 sprays into both nostrils daily. SPRAY 2 SPRAYS INTO EACH NOSTRIL  EVERY DAY Strength: 50 MCG/ACT  Dispense: 48 mL; Refill: 2 - SYMBICORT 80-4.5 MCG/ACT inhaler; Take 2 puffs first thing in am and then another 2 puffs about 12 hours later.  Dispense: 1 each; Refill: 12  5. Dysphagia, unspecified type Chew food well.  6. Irritable bowel syndrome with diarrhea - dicyclomine (BENTYL) 10 MG capsule; Take 1 capsule (10 mg total) by mouth 4 (four) times daily as needed for spasms.  Dispense: 360 capsule; Refill: 0  7. Gastroesophageal reflux disease, unspecified whether esophagitis present Avoid spicy foods Do not eat 2 hours prior to bedtime - pantoprazole (PROTONIX) 40 MG tablet; Take 1 tablet (40 mg total) by mouth 2 (two) times daily.  Dispense: 180 tablet; Refill: 0  8. Anxiety Stress management - ALPRAZolam (XANAX) 0.5 MG tablet; Take 1 tablet (0.5 mg total) by mouth 2 (two) times daily.  Dispense: 60 tablet; Refill: 2  9. Recurrent major depressive disorder, in full remission ([place]) - PARoxetine (PAXIL) 40 MG tablet; TAKE 1 TABLET BY  MOUTH EVERY DAY IN THE MORNING  Dispense: 90 tablet; Refill: 1  10. Chronic midline low back pain without sciatica - HYDROcodone-acetaminophen (NORCO) 10-325 MG tablet; Take 1 tablet by mouth in the morning, at noon, and at bedtime.  Dispense: 90 tablet; Refill: 0 - HYDROcodone-acetaminophen (NORCO) 10-325 MG tablet; Take 1 tablet by mouth in the morning, at noon, and at bedtime.  Dispense: 90 tablet; Refill: 0 - HYDROcodone-acetaminophen (NORCO) 10-325 MG tablet; Take 1 tablet by mouth in the morning, at noon, and at bedtime.  Dispense: 90 tablet; Refill: 0  11. Cigarette smoker Please cut back on cigarettte smoke  12. BMI 32.0-32.9,adult Discussed diet and exercise for person with BMI >25 Will recheck weight in 3-6 months    Labs pending Health Maintenance reviewed Diet and exercise encouraged  Follow up plan: 3 months   [name], FNP

## 2022-04-21 NOTE — Patient Instructions (Signed)

## 2022-04-22 LAB — CMP14+EGFR
ALT: 35 IU/L — ABNORMAL HIGH (ref 0–32)
AST: 34 IU/L (ref 0–40)
Albumin/Globulin Ratio: 2 (ref 1.2–2.2)
Albumin: 4.3 g/dL (ref 3.8–4.8)
Alkaline Phosphatase: 176 IU/L — ABNORMAL HIGH (ref 44–121)
BUN/Creatinine Ratio: 16 (ref 12–28)
BUN: 9 mg/dL (ref 8–27)
Bilirubin Total: 0.4 mg/dL (ref 0.0–1.2)
CO2: 26 mmol/L (ref 20–29)
Calcium: 9.8 mg/dL (ref 8.7–10.3)
Chloride: 98 mmol/L (ref 96–106)
Creatinine, Ser: 0.55 mg/dL — ABNORMAL LOW (ref 0.57–1.00)
Globulin, Total: 2.1 g/dL (ref 1.5–4.5)
Glucose: 83 mg/dL (ref 70–99)
Potassium: 4.3 mmol/L (ref 3.5–5.2)
Sodium: 139 mmol/L (ref 134–144)
Total Protein: 6.4 g/dL (ref 6.0–8.5)
eGFR: 96 mL/min/{1.73_m2} (ref >59)

## 2022-04-22 LAB — CBC WITH DIFFERENTIAL/PLATELET
Basophils Absolute: 0.1 10*3/uL (ref 0.0–0.2)
Basos: 1 %
EOS (ABSOLUTE): 0.2 10*3/uL (ref 0.0–0.4)
Eos: 3 %
Hematocrit: 43.9 % (ref 34.0–46.6)
Hemoglobin: 14.7 g/dL (ref 11.1–15.9)
Immature Grans (Abs): 0 10*3/uL (ref 0.0–0.1)
Immature Granulocytes: 0 %
Lymphocytes Absolute: 2.6 10*3/uL (ref 0.7–3.1)
Lymphs: 33 %
MCH: 29.6 pg (ref 26.6–33.0)
MCHC: 33.5 g/dL (ref 31.5–35.7)
MCV: 88 fL (ref 79–97)
Monocytes Absolute: 0.7 10*3/uL (ref 0.1–0.9)
Monocytes: 10 %
Neutrophils Absolute: 4.2 10*3/uL (ref 1.4–7.0)
Neutrophils: 53 %
Platelets: 249 10*3/uL (ref 150–450)
RBC: 4.97 x10E6/uL (ref 3.77–5.28)
RDW: 12.4 % (ref 11.7–15.4)
WBC: 7.8 10*3/uL (ref 3.4–10.8)

## 2022-04-22 LAB — LIPID PANEL
Chol/HDL Ratio: 2.8 ratio (ref 0.0–4.4)
Cholesterol, Total: 221 mg/dL — ABNORMAL HIGH (ref 100–199)
HDL: 78 mg/dL (ref >39)
LDL Chol Calc (NIH): 122 mg/dL — ABNORMAL HIGH (ref 0–99)
Triglycerides: 121 mg/dL (ref 0–149)
VLDL Cholesterol Cal: 21 mg/dL (ref 5–40)

## 2022-05-26 ENCOUNTER — Ambulatory Visit: Payer: Medicare Other

## 2022-06-19 IMAGING — DX DG CHEST 2V
2 series · 2 of 2 positions shown · non-contrast
Comparison: 11/19/2017

CLINICAL DATA: Smoking history.

EXAM:
CHEST - 2 VIEW

[chest pa]
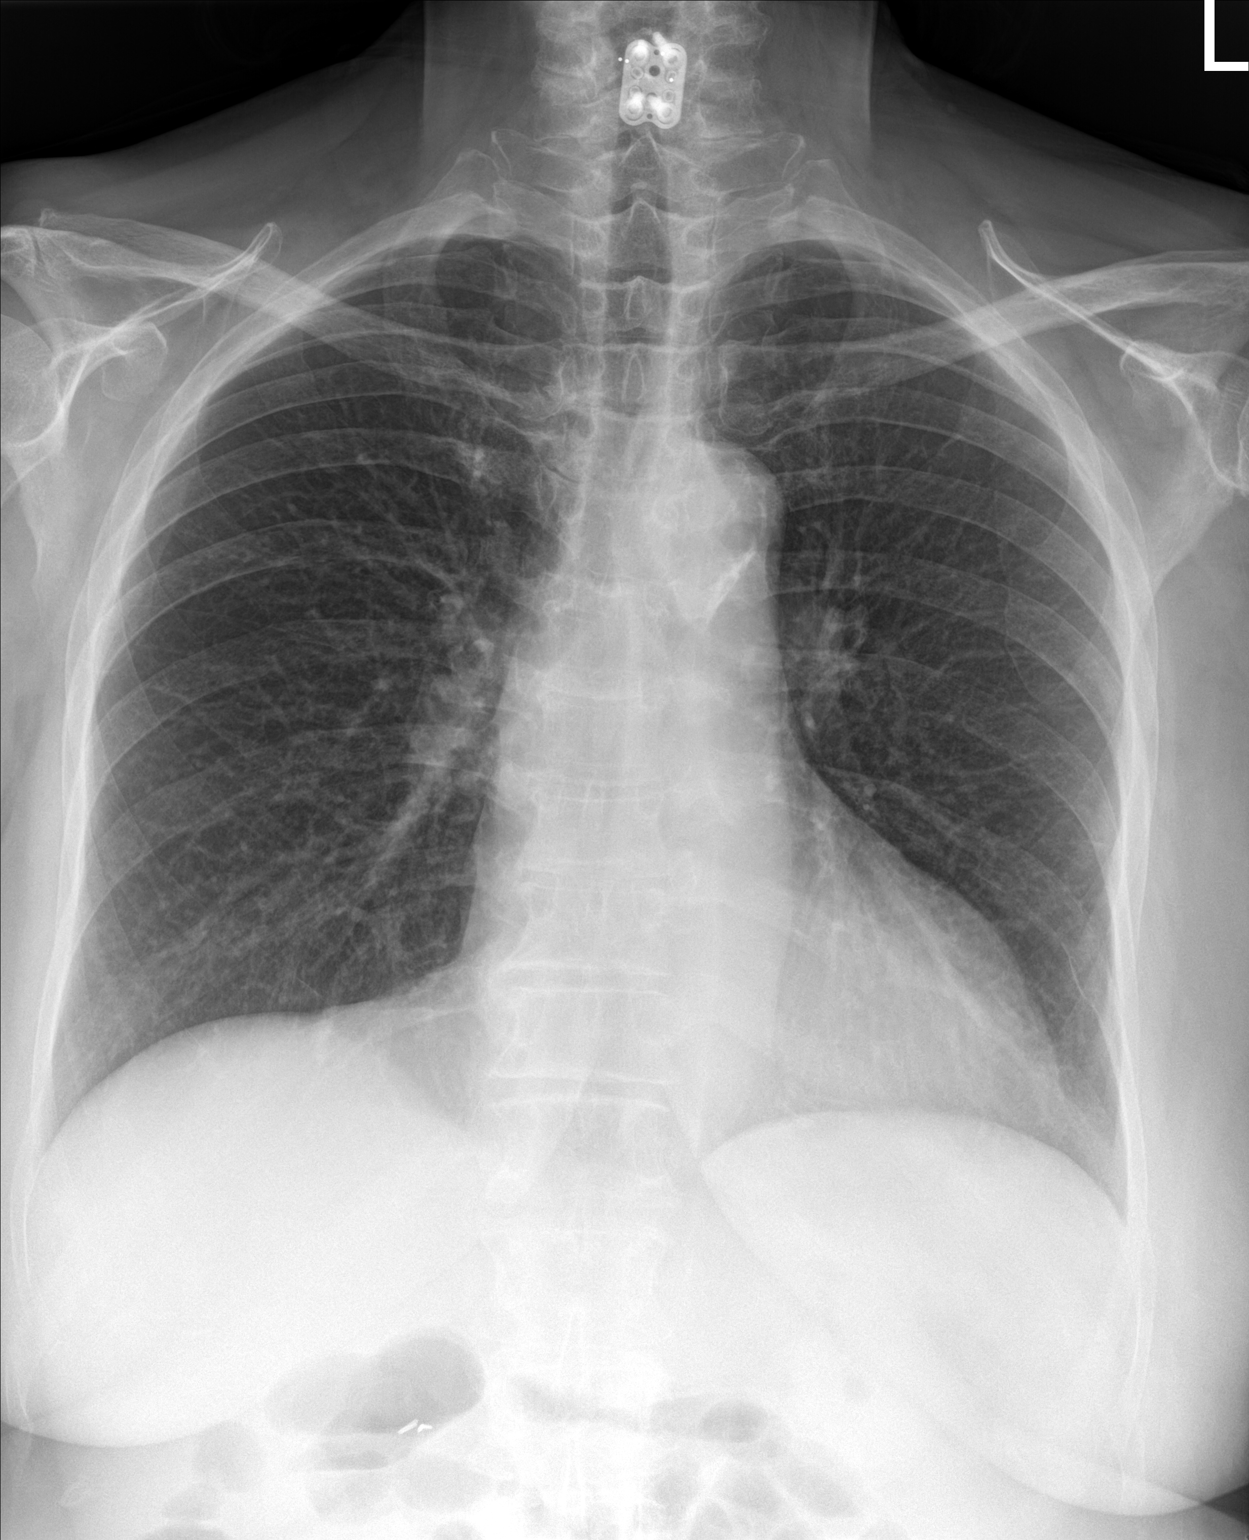

[chest lat]
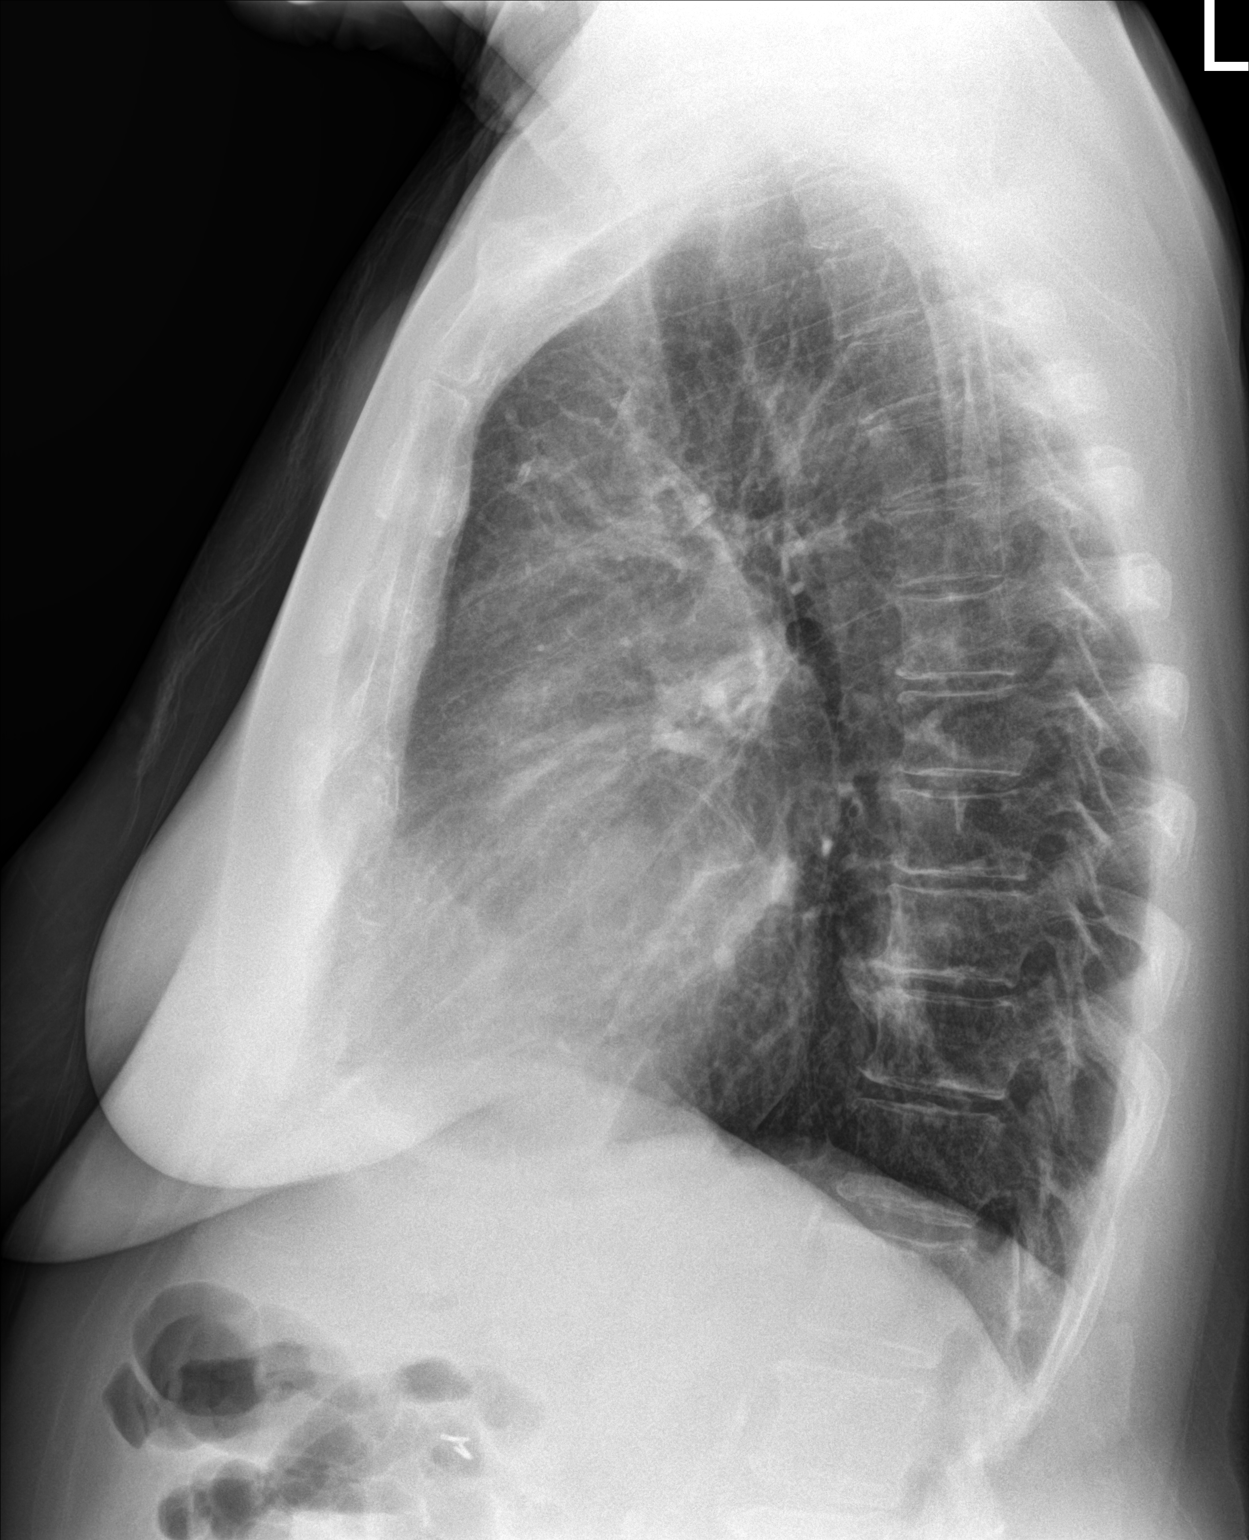

[2 of 2 positions shown; findings below may reference images not displayed]

FINDINGS: The heart size and mediastinal contours are within normal limits.
Atherosclerotic calcification of the aortic knob. No focal airspace
consolidation, pleural effusion, or pneumothorax. Lower cervical
ACDF hardware. The visualized skeletal structures are unremarkable.
IMPRESSION: No active cardiopulmonary disease.

## 2022-07-05 ENCOUNTER — Encounter: Payer: Self-pay | Admitting: Nurse Practitioner

## 2022-07-05 ENCOUNTER — Ambulatory Visit (INDEPENDENT_AMBULATORY_CARE_PROVIDER_SITE_OTHER): Payer: Medicare Other | Admitting: Nurse Practitioner

## 2022-07-05 VITALS — BP 143/83 | HR 81 | Temp 98.1°F | Resp 20 | Ht 65.0 in | Wt 186.0 lb

## 2022-07-05 DIAGNOSIS — F419 Anxiety disorder, unspecified: Secondary | ICD-10-CM | POA: Diagnosis not present

## 2022-07-05 DIAGNOSIS — H938X2 Other specified disorders of left ear: Secondary | ICD-10-CM

## 2022-07-05 DIAGNOSIS — J4489 Other specified chronic obstructive pulmonary disease: Secondary | ICD-10-CM | POA: Diagnosis not present

## 2022-07-05 MED ORDER — ALPRAZOLAM 0.5 MG PO TABS: 0.5000 mg | ORAL_TABLET | Freq: Two times a day (BID) | ORAL | 2 refills | Status: DC

## 2022-07-05 NOTE — Addendum Note (Signed)
Addended by: Rolena Infante on: 07/05/2022 04:07 PM   Modules accepted: Orders

## 2022-07-05 NOTE — Progress Notes (Addendum)
   Subjective:    Patient ID: [name], female    DOB: 1946/07/11, 76 y.o.   MRN: [id number]   Chief Complaint: Sore pleace on left ear   Pt seen today for sore place on left ear that showed up about a month ago. Pt reports it started as a small red place that was sore; then developed a white head which ended up draining later. Place is still present and sore to touch. Denies any insect bites or injury. No fevers or other symptoms.     Patient has needed her oxygen more at home. She has noticed she gets winded when she goes out and needs portable oxygen.   Review of Systems  Respiratory:  Negative for chest tightness and shortness of breath.   Cardiovascular:  Negative for chest pain, palpitations and leg swelling.  All other systems reviewed and are negative.      Objective:   Physical Exam Vitals and nursing note reviewed.  HENT:     Ears:      Comments: Small round, firm, erythematous lesion to upper pinna; no warmth, swelling or drainage noted. Tender to palpation.    BP (!) 143/83   Pulse 81   Temp 98.1 F (36.7 C) (Temporal)   Resp 20   Ht '5\' 5"'$  (1.651 m)   Wt 186 lb (84.4 kg)   SpO2 90%   BMI 30.95 kg/m   O2 sat at room air 87%     Assessment & Plan:   [name] in today with chief complaint of Sore pleace on left ear   1. Mass of left ear auricle Do not pick or scratch at area Referral to dermatology  2. Low o2 sat Needs portable oxygen  The above assessment and management plan was discussed with the patient. The patient verbalized understanding of and has agreed to the management plan. Patient is aware to call the clinic if symptoms persist or worsen. Patient is aware when to return to the clinic for a follow-up visit. Patient educated on when it is appropriate to go to the emergency department.   [name], FNP

## 2022-07-05 NOTE — Addendum Note (Signed)
Addended by: Chevis Pretty on: 07/05/2022 03:37 PM   Modules accepted: Orders

## 2022-07-07 NOTE — Progress Notes (Signed)
Oxygen POC order & notes sent to [facility] fax# [phone]

## 2022-07-17 ENCOUNTER — Other Ambulatory Visit: Payer: Self-pay | Admitting: Nurse Practitioner

## 2022-07-17 DIAGNOSIS — F419 Anxiety disorder, unspecified: Secondary | ICD-10-CM

## 2022-07-19 ENCOUNTER — Telehealth: Payer: Self-pay | Admitting: Nurse Practitioner

## 2022-07-19 DIAGNOSIS — F419 Anxiety disorder, unspecified: Secondary | ICD-10-CM

## 2022-07-19 DIAGNOSIS — M545 Low back pain, unspecified: Secondary | ICD-10-CM

## 2022-07-19 MED ORDER — HYDROCODONE-ACETAMINOPHEN 10-325 MG PO TABS: 1.0000 | ORAL_TABLET | Freq: Three times a day (TID) | ORAL | 0 refills | Status: DC

## 2022-07-19 MED ORDER — ALPRAZOLAM 0.5 MG PO TABS: 0.5000 mg | ORAL_TABLET | Freq: Two times a day (BID) | ORAL | 0 refills | Status: DC

## 2022-07-19 NOTE — Telephone Encounter (Signed)
  Prescription Request  07/19/2022  Is this a "Controlled Substance" medicine? yes  Have you seen your PCP in the last 2 weeks? no  If YES, route message to pool  -  If NO, patient needs to be scheduled for appointment.  What is the name of the medication or equipment? Alprazolam 0.5 mg and Hydrocodone 10-325 mg. Told patient she needs to be seen and she has appt 10-23 and MMM told her they would work something out.  Have you contacted your pharmacy to request a refill? yes   Which pharmacy would you like this sent to? [facility] in [place]   Patient notified that their request is being sent to the clinical staff for review and that they should receive a response within 2 business days.

## 2022-07-19 NOTE — Telephone Encounter (Signed)
Patient pain meds have ran out as well as xanax. She has an appointment next week. I will do one refill of each until sheis seen next week.  Meds ordered this encounter  Medications   HYDROcodone-acetaminophen (NORCO) 10-325 MG tablet    Sig: Take 1 tablet by mouth in the morning, at noon, and at bedtime.    Dispense:  90 tablet    Refill:  0    Order Specific Question:   Supervising Provider    Answer:   Chase Picket [[id number]]   ALPRAZolam (XANAX) 0.5 MG tablet    Sig: Take 1 tablet (0.5 mg total) by mouth 2 (two) times daily.    Dispense:  60 tablet    Refill:  0    Order Specific Question:   Supervising Provider    Answer:   Chase Picket [[id number]]   [place], FNP

## 2022-07-22 ENCOUNTER — Other Ambulatory Visit: Payer: Self-pay

## 2022-07-22 ENCOUNTER — Telehealth: Payer: Self-pay | Admitting: Nurse Practitioner

## 2022-07-22 DIAGNOSIS — J449 Chronic obstructive pulmonary disease, unspecified: Secondary | ICD-10-CM

## 2022-07-22 DIAGNOSIS — G8929 Other chronic pain: Secondary | ICD-10-CM

## 2022-07-22 MED ORDER — HYDROCODONE-ACETAMINOPHEN 10-325 MG PO TABS: 1.0000 | ORAL_TABLET | Freq: Three times a day (TID) | ORAL | 0 refills | Status: DC

## 2022-07-22 NOTE — Telephone Encounter (Signed)
Pain meds since to [facility]- local pharmacy out of meds Meds ordered this encounter  Medications   HYDROcodone-acetaminophen (NORCO) 10-325 MG tablet    Sig: Take 1 tablet by mouth in the morning, at noon, and at bedtime.    Dispense:  90 tablet    Refill:  0    Order Specific Question:   Supervising Provider    Answer:   Worthy Rancher [[id number]]   [place], FNP

## 2022-07-25 ENCOUNTER — Ambulatory Visit (INDEPENDENT_AMBULATORY_CARE_PROVIDER_SITE_OTHER): Payer: Medicare Other | Admitting: Nurse Practitioner

## 2022-07-25 ENCOUNTER — Encounter: Payer: Self-pay | Admitting: Nurse Practitioner

## 2022-07-25 VITALS — BP 154/82 | HR 72 | Temp 97.4°F | Resp 20 | Ht 65.0 in | Wt 186.0 lb

## 2022-07-25 DIAGNOSIS — M545 Low back pain, unspecified: Secondary | ICD-10-CM | POA: Diagnosis not present

## 2022-07-25 DIAGNOSIS — M5020 Other cervical disc displacement, unspecified cervical region: Secondary | ICD-10-CM

## 2022-07-25 DIAGNOSIS — G8929 Other chronic pain: Secondary | ICD-10-CM

## 2022-07-25 DIAGNOSIS — J449 Chronic obstructive pulmonary disease, unspecified: Secondary | ICD-10-CM

## 2022-07-25 MED ORDER — HYDROCODONE-ACETAMINOPHEN 10-325 MG PO TABS: 1.0000 | ORAL_TABLET | Freq: Three times a day (TID) | ORAL | 0 refills | Status: DC

## 2022-07-25 NOTE — Progress Notes (Signed)
Subjective:    Patient ID: [name], female    DOB: 1946/03/01, 76 y.o.   MRN: [id number]   Chief Complaint: pain management   HPI:  [name] is a 76 y.o. who identifies as a female who was assigned female at birth.   Social history: Lives with: by herself Work history: retired   Scientist, forensic in today for follow up of the following chronic medical issues:  1. Prolapsed cervical intervertebral disc Pain assessment: Cause of pain- DDD Pain location- lower and upper back Pain on scale of 1-10- 6/10 currently Frequency- daily What increases pain-to much movement What makes pain Better-rest helps Effects on ADL - none Any change in general medical condition-none  Current opioids rx- norco 10/325 # meds rx- 90 Effectiveness of current meds-none Adverse reactions from pain meds-none Morphine equivalent- 30 MME  Pill count performed-No Last drug screen - 01/18/22 ( high risk q67m moderate risk q678mlow risk yearly ) Urine drug screen today- No [place]  If yes were their any concerning findings? - no   Overdose risk: 1  Pain contract signed on 01/19/22    New complaints: None today  Allergies  Allergen Reactions   Meloxicam Other (See Comments)    Stomach ulcers   Outpatient Encounter Medications as of 07/25/2022  Medication Sig   albuterol (VENTOLIN HFA) 108 (90 Base) MCG/ACT inhaler 2 puffs q6 prn   ALPRAZolam (XANAX) 0.5 MG tablet Take 1 tablet (0.5 mg total) by mouth 2 (two) times daily.   Aspirin-Acetaminophen-Caffeine (GOODYS EXTRA STRENGTH) 500-325-65 MG PACK Take 1 packet by mouth 3 (three) times daily as needed (pain.).   Calcium Carb-Cholecalciferol (CALCIUM+D3 PO) Take 1 tablet by mouth 3 (three) times a week.   CLENPIQ 10-3.5-12 MG-GM -GM/160ML SOLN Take 320 mLs by mouth as directed.   dicyclomine (BENTYL) 10 MG capsule Take 1 capsule (10 mg total) by mouth 4 (four) times daily as needed for spasms.    diphenhydrAMINE (BENADRYL) 25 MG tablet Take 25 mg by mouth in the morning and at bedtime.   doxylamine, Sleep, (UNISOM) 25 MG tablet Take 25 mg by mouth at bedtime as needed for sleep.   fluticasone (FLONASE) 50 MCG/ACT nasal spray Place 2 sprays into both nostrils daily.   Homeopathic Products (LEG CRAMPS) TABS Take 1-2 tablets by mouth daily as needed (leg cramps).   HYDROcodone-acetaminophen (NORCO) 10-325 MG tablet Take 1 tablet by mouth in the morning, at noon, and at bedtime.   HYDROcodone-acetaminophen (NORCO) 10-325 MG tablet Take 1 tablet by mouth in the morning, at noon, and at bedtime.   HYDROcodone-acetaminophen (NORCO) 10-325 MG tablet Take 1 tablet by mouth in the morning, at noon, and at bedtime.   ipratropium (ATROVENT) 0.02 % nebulizer solution USE 1 VIAL BY NEBULIZATION EVERY 6 (SIX) HOURS AS NEEDED FOR WHEEZING OR SHORTNESS OF BREATH.   pantoprazole (PROTONIX) 40 MG tablet Take 1 tablet (40 mg total) by mouth 2 (two) times daily.   PARoxetine (PAXIL) 40 MG tablet TAKE 1 TABLET BY MOUTH EVERY DAY IN THE MORNING   Phenazopyridine HCl (AZO DINE PO) Take 1-2 tablets by mouth 3 (three) times daily as needed (urinary pain/discomfort).   Polyethyl Glycol-Propyl Glycol (LUBRICANT EYE DROPS) 0.4-0.3 % SOLN Place 1-2 drops into both eyes 3 (three) times daily as needed (dry/irritated eyes.).   SYMBICORT 80-4.5 MCG/ACT inhaler Take 2 puffs first thing in am and then another 2 puffs about 12 hours later.   valsartan-hydrochlorothiazide (DIOVAN-HCT)  160-12.5 MG tablet Take 1 tablet by mouth daily.   Facility-Administered Encounter Medications as of 07/25/2022  Medication   acyclovir ointment (ZOVIRAX) 5 %    Past Surgical History:  Procedure Laterality Date   APPENDECTOMY     76 years old   BACK SURGERY     X 2   BIOPSY  08/11/2016   Procedure: BIOPSY;  Surgeon: Daneil Dolin, MD;  [facility];  Service: Endoscopy;;  gastric bx's   BIOPSY  07/01/2021   Procedure: BIOPSY;   Surgeon: Daneil Dolin, MD;  [facility];  Service: Endoscopy;;   BREAST SURGERY     biopsy only   CARPAL TUNNEL RELEASE     right hand   CERVICAL BIOPSY  W/ LOOP ELECTRODE EXCISION  02/2011   CIN-2 with focal ectocervical margin involvement. With CIN-1 ECC negative   CHOLECYSTECTOMY  1984   COLONOSCOPY WITH PROPOFOL N/A 07/01/2021   Procedure: COLONOSCOPY WITH PROPOFOL;  Surgeon: Daneil Dolin, MD;  [facility];  Service: Endoscopy;  Laterality: N/A;  1:00pm   ESOPHAGOGASTRODUODENOSCOPY  12/01/2011   Hiatal hernia/Mild fibrotic pyloric stenosis status post dilation with passage of  the scope.  The previously noted gastric ulcer completely healed  The remainder of the gastric mucosa appeared normal   ESOPHAGOGASTRODUODENOSCOPY (EGD) WITH PROPOFOL N/A 08/11/2016   Procedure: ESOPHAGOGASTRODUODENOSCOPY (EGD) WITH PROPOFOL;  Surgeon: Daneil Dolin, MD;  [facility];  Service: Endoscopy;  Laterality: N/A;  2:15 PM   ESOPHAGOGASTRODUODENOSCOPY (EGD) WITH PROPOFOL N/A 07/01/2021   Procedure: ESOPHAGOGASTRODUODENOSCOPY (EGD) WITH PROPOFOL;  Surgeon: Daneil Dolin, MD;  [facility];  Service: Endoscopy;  Laterality: N/A;   FRACTURE SURGERY  10/18/2016   left knee in 3 places   [place]  07/28/2011   Schatzki's ring s/p 1F with small UES tear as well, small hh, 1cm pyloric channel ulcer, bx benign without H.Pylori. Mobic/goody's at time.   MALONEY DILATION N/A 08/11/2016   Procedure: Venia Minks DILATION;  Surgeon: Daneil Dolin, MD;  [facility];  Service: Endoscopy;  Laterality: N/A;   MALONEY DILATION N/A 07/01/2021   Procedure: Venia Minks DILATION;  Surgeon: Daneil Dolin, MD;  [facility];  Service: Endoscopy;  Laterality: N/A;   ORIF PATELLA Left 10/19/2016   Procedure: OPEN REDUCTION INTERNAL (ORIF) FIXATION PATELLA;  Surgeon: Carole Civil, MD;  [facility];  Service: Orthopedics;  Laterality: Left;    POLYPECTOMY  07/01/2021   Procedure: POLYPECTOMY;  Surgeon: Daneil Dolin, MD;  [facility];  Service: Endoscopy;;   SPINE SURGERY  2009   cervical disc   SPINE SURGERY  2009   lumbar   TUBAL LIGATION  1976   tumor removed from arm      Family History  Problem Relation Age of Onset   Heart disease Father        CHF   Heart disease Mother    COPD Sister    Diabetes Sister    Neuropathy Sister    COPD Sister    COPD Sister    Cancer Sister    Stroke Brother    Heart disease Brother    Colon cancer Neg Hx    Anesthesia problems Neg Hx    Hypotension Neg Hx    Malignant hyperthermia Neg Hx    Pseudochol deficiency Neg Hx    Colon polyps Neg Hx  Review of Systems  Respiratory:  Positive for wheezing. Negative for cough and shortness of breath.   Musculoskeletal:  Positive for back pain.       Objective:   Physical Exam Vitals and nursing note reviewed.  Constitutional:      General: She is not in acute distress.    Appearance: Normal appearance. She is well-developed.  Neck:     Vascular: No carotid bruit or JVD.  Cardiovascular:     Rate and Rhythm: Normal rate and regular rhythm.     Heart sounds: Normal heart sounds.  Pulmonary:     Effort: Pulmonary effort is normal. No respiratory distress.     Breath sounds: Wheezing (expiratory throughout) present. No rales.  Chest:     Chest wall: No tenderness.  Abdominal:     General: Bowel sounds are normal. There is no distension or abdominal bruit.     Palpations: Abdomen is soft. There is no hepatomegaly, splenomegaly, mass or pulsatile mass.     Tenderness: There is no abdominal tenderness.  Musculoskeletal:        General: Normal range of motion.     Cervical back: Normal range of motion and neck supple.  Lymphadenopathy:     Cervical: No cervical adenopathy.  Skin:    General: Skin is warm and dry.  Neurological:     Mental Status: She is alert and oriented to person, place, and  time.     Deep Tendon Reflexes: Reflexes are normal and symmetric.  Psychiatric:        Behavior: Behavior normal.        Thought Content: Thought content normal.        Judgment: Judgment normal.     BP (!) 154/82   Pulse 72   Temp (!) 97.4 F (36.3 C) (Temporal)   Resp 20   Ht '5\' 5"'$  (1.651 m)   Wt 186 lb (84.4 kg)   SpO2 91%   BMI 30.95 kg/m         Assessment & Plan:  [name] in today with chief complaint of Pain Management   1. Prolapsed cervical intervertebral disc 2. Chronic midline low back pain without sciatica Moist heat rest - HYDROcodone-acetaminophen (NORCO) 10-325 MG tablet; Take 1 tablet by mouth in the morning, at noon, and at bedtime.  Dispense: 90 tablet; Refill: 0 - HYDROcodone-acetaminophen (NORCO) 10-325 MG tablet; Take 1 tablet by mouth in the morning, at noon, and at bedtime.  Dispense: 90 tablet; Refill: 0 - HYDROcodone-acetaminophen (NORCO) 10-325 MG tablet; Take 1 tablet by mouth in the morning, at noon, and at bedtime.  Dispense: 90 tablet; Refill: 0  3. COPD mixed type ([place]) Smoking cessation Continue oxygen Inhaler as needed    The above assessment and management plan was discussed with the patient. The patient verbalized understanding of and has agreed to the management plan. Patient is aware to call the clinic if symptoms persist or worsen. Patient is aware when to return to the clinic for a follow-up visit. Patient educated on when it is appropriate to go to the emergency department.   [name], FNP

## 2022-07-25 NOTE — Patient Instructions (Signed)

## 2022-08-11 ENCOUNTER — Telehealth: Payer: Self-pay | Admitting: Nurse Practitioner

## 2022-08-11 NOTE — Telephone Encounter (Signed)
Left message for patient to call back and schedule Medicare Annual Wellness Visit (AWV) to be completed by video or phone.   Last AWV:  05/25/2021    Please schedule at anytime with [place]     45 minute appointment  Any questions, please contact me at [phone]

## 2022-08-16 DIAGNOSIS — H61002 Unspecified perichondritis of left external ear: Secondary | ICD-10-CM | POA: Diagnosis not present

## 2022-08-23 DIAGNOSIS — J441 Chronic obstructive pulmonary disease with (acute) exacerbation: Secondary | ICD-10-CM | POA: Diagnosis not present

## 2022-09-20 ENCOUNTER — Telehealth: Payer: Self-pay

## 2022-09-20 MED ORDER — HYDROCODONE-ACETAMINOPHEN 5-325 MG PO TABS: 2.0000 | ORAL_TABLET | Freq: Three times a day (TID) | ORAL | 0 refills | Status: DC

## 2022-09-20 NOTE — Telephone Encounter (Signed)
Patient says that [facility] doesn't have hydrocodone 10-325, they only have 5-325. Can this be sent in instead? Please review and advise

## 2022-09-22 DIAGNOSIS — J441 Chronic obstructive pulmonary disease with (acute) exacerbation: Secondary | ICD-10-CM | POA: Diagnosis not present

## 2022-10-10 ENCOUNTER — Telehealth: Payer: Self-pay | Admitting: Nurse Practitioner

## 2022-10-12 NOTE — Telephone Encounter (Signed)
NA/NVM, in looking through media [facility]CP [facility] gas& liquid contents, portable oxygen fill system.

## 2022-10-14 ENCOUNTER — Other Ambulatory Visit: Payer: Self-pay | Admitting: Nurse Practitioner

## 2022-10-14 DIAGNOSIS — K219 Gastro-esophageal reflux disease without esophagitis: Secondary | ICD-10-CM

## 2022-10-14 DIAGNOSIS — I10 Essential (primary) hypertension: Secondary | ICD-10-CM

## 2022-10-23 DIAGNOSIS — J441 Chronic obstructive pulmonary disease with (acute) exacerbation: Secondary | ICD-10-CM | POA: Diagnosis not present

## 2022-11-03 ENCOUNTER — Telehealth: Payer: Self-pay | Admitting: *Deleted

## 2022-11-03 ENCOUNTER — Encounter: Payer: Self-pay | Admitting: Nurse Practitioner

## 2022-11-03 ENCOUNTER — Ambulatory Visit (INDEPENDENT_AMBULATORY_CARE_PROVIDER_SITE_OTHER): Payer: 59 | Admitting: Nurse Practitioner

## 2022-11-03 VITALS — BP 133/79 | HR 85 | Temp 96.9°F | Resp 20 | Ht 65.0 in | Wt 181.0 lb

## 2022-11-03 DIAGNOSIS — J4489 Other specified chronic obstructive pulmonary disease: Secondary | ICD-10-CM

## 2022-11-03 DIAGNOSIS — J449 Chronic obstructive pulmonary disease, unspecified: Secondary | ICD-10-CM

## 2022-11-03 DIAGNOSIS — E876 Hypokalemia: Secondary | ICD-10-CM | POA: Diagnosis not present

## 2022-11-03 DIAGNOSIS — R1310 Dysphagia, unspecified: Secondary | ICD-10-CM

## 2022-11-03 DIAGNOSIS — F17210 Nicotine dependence, cigarettes, uncomplicated: Secondary | ICD-10-CM

## 2022-11-03 DIAGNOSIS — K219 Gastro-esophageal reflux disease without esophagitis: Secondary | ICD-10-CM | POA: Diagnosis not present

## 2022-11-03 DIAGNOSIS — K580 Irritable bowel syndrome with diarrhea: Secondary | ICD-10-CM

## 2022-11-03 DIAGNOSIS — F3342 Major depressive disorder, recurrent, in full remission: Secondary | ICD-10-CM

## 2022-11-03 DIAGNOSIS — M545 Low back pain, unspecified: Secondary | ICD-10-CM

## 2022-11-03 DIAGNOSIS — Z6832 Body mass index (BMI) 32.0-32.9, adult: Secondary | ICD-10-CM

## 2022-11-03 DIAGNOSIS — E785 Hyperlipidemia, unspecified: Secondary | ICD-10-CM

## 2022-11-03 DIAGNOSIS — I10 Essential (primary) hypertension: Secondary | ICD-10-CM

## 2022-11-03 DIAGNOSIS — I700 Atherosclerosis of aorta: Secondary | ICD-10-CM

## 2022-11-03 DIAGNOSIS — G8929 Other chronic pain: Secondary | ICD-10-CM

## 2022-11-03 DIAGNOSIS — F419 Anxiety disorder, unspecified: Secondary | ICD-10-CM

## 2022-11-03 MED ORDER — ALPRAZOLAM 0.5 MG PO TABS: 0.5000 mg | ORAL_TABLET | Freq: Two times a day (BID) | ORAL | 2 refills | Status: DC

## 2022-11-03 MED ORDER — HYDROCODONE-ACETAMINOPHEN 10-325 MG PO TABS: 1.0000 | ORAL_TABLET | Freq: Three times a day (TID) | ORAL | 0 refills | Status: DC

## 2022-11-03 MED ORDER — PAROXETINE HCL 40 MG PO TABS: ORAL_TABLET | ORAL | 1 refills | Status: DC

## 2022-11-03 MED ORDER — IPRATROPIUM BROMIDE 0.02 % IN SOLN: RESPIRATORY_TRACT | 2 refills | Status: DC

## 2022-11-03 MED ORDER — DICYCLOMINE HCL 10 MG PO CAPS: 10.0000 mg | ORAL_CAPSULE | Freq: Four times a day (QID) | ORAL | 1 refills | Status: DC | PRN

## 2022-11-03 MED ORDER — SYMBICORT 80-4.5 MCG/ACT IN AERO: INHALATION_SPRAY | RESPIRATORY_TRACT | 12 refills | Status: DC

## 2022-11-03 MED ORDER — VALSARTAN-HYDROCHLOROTHIAZIDE 160-12.5 MG PO TABS: 1.0000 | ORAL_TABLET | Freq: Every day | ORAL | 1 refills | Status: DC

## 2022-11-03 MED ORDER — PANTOPRAZOLE SODIUM 40 MG PO TBEC: 40.0000 mg | DELAYED_RELEASE_TABLET | Freq: Two times a day (BID) | ORAL | 1 refills | Status: DC

## 2022-11-03 NOTE — Progress Notes (Signed)
Subjective:    Patient ID: [name], female    DOB: 10/30/1945, 77 y.o.   MRN: [id number]   Chief Complaint: medical management of chronic issues     HPI:  [name] is a 77 y.o. who identifies as a female who was assigned female at birth.   Social history: Lives with: by herself Work history: retired   Scientist, forensic in today for follow up of the following chronic medical issues:  1. Benign hypertension No c/o chest pain or headache. Does not check blood pressure at home. BP Readings from Last 3 Encounters:  11/03/22 133/79  07/25/22 (!) 154/82  07/05/22 (!) 143/83      2. Hyperlipidemia with target LDL less than 100 Does not watch diet and does no exercise. Refuses statin Lab Results  Component Value Date   CHOL 221 (H) 04/21/2022   HDL 78 04/21/2022   LDLCALC 122 (H) 04/21/2022   TRIG 121 04/21/2022   CHOLHDL 2.8 04/21/2022     3. Aortic atherosclerosis ([place]) Has not seen cardiology in several years.  4. COPD GOLD ? / active smoker  Uses symbicort daily and weekly use of albuterol. Will not quit smoking. She refuses to see pulmonology. Uses neb daily.  5. Dysphagia, unspecified type No recent issues  6. Gastroesophageal reflux disease, unspecified whether esophagitis present Is on portonix daily  7. Irritable bowel syndrome with diarrhea Still alternating between diarrhea and constipation. Bentyl helps with cramping.  8. Anxiety Is on xanax BID- has been on for several years- will not increase her dose.    11/03/2022    2:08 PM 07/25/2022    2:11 PM 04/21/2022    2:08 PM 01/18/2022    2:10 PM  GAD 7 : Generalized Anxiety Score  Nervous, Anxious, on Edge 1 0 1 1  Control/stop worrying 0 0 1 1  Worry too much - different things 0 0 1 1  Trouble relaxing 0 0 1 1  Restless 0 0 0 1  Easily annoyed or irritable 0 0 0 0  Afraid - awful might happen 0 0 0 1  Total GAD 7 Score 1 0 4 6  Anxiety Difficulty Not difficult at all Not difficult at  all Not difficult at all Somewhat difficult      9. Recurrent major depressive disorder, in full remission ([place]) Is on paxil, but still has episodes of depression.    11/03/2022    2:07 PM 07/25/2022    2:11 PM 04/21/2022    2:07 PM  Depression screen PHQ 2/9  Decreased Interest 0 0 1  Down, Depressed, Hopeless 1 0 1  PHQ - 2 Score 1 0 2  Altered sleeping 1 0 1  Tired, decreased energy 1 0 1  Change in appetite 0 0 0  Feeling bad or failure about yourself  0 0 0  Trouble concentrating 0 0 0  Moving slowly or fidgety/restless 0 0 0  Suicidal thoughts 0 0 0  PHQ-9 Score 3 0 4  Difficult doing work/chores Somewhat difficult Not difficult at all Somewhat difficult     10. Hypokalemia No c/o muscle cramping Lab Results  Component Value Date   K 4.3 04/21/2022     11. Cigarette smoker Refuses  to quit smoking  12. Chronic midline low back pain without sciatica Pain assessment: Cause of pain- DDD Pain location- lower back pain Pain on scale of 1-10- 8/10 Frequency- daily What increases pain-to much activity What makes pain Better-nothing really  Effects on ADL - none Any change in general medical condition-none  Current opioids rx- norco 10/325 BID # meds rx- 60 Effectiveness of current meds-helps Adverse reactions from pain meds-none Morphine equivalent- 30MME  Pill count performed-No Last drug screen - 01/18/22 ( high risk q38m moderate risk q629mlow risk yearly ) Urine drug screen today- No [place]  If yes were their any concerning findings? - no   Overdose risk: 1    Pain contract signed on:01/19/22   13. BMI 32.0-32.9,adult No recent weight changes Wt Readings from Last 3 Encounters:  07/25/22 186 lb (84.4 kg)  07/05/22 186 lb (84.4 kg)  04/21/22 188 lb (85.3 kg)   BMI Readings from Last 3 Encounters:  07/25/22 30.95 kg/m  07/05/22 30.95 kg/m  04/21/22 31.28 kg/m     New complaints: None  today  Allergies   Allergen Reactions   Meloxicam Other (See Comments)    Stomach ulcers   Outpatient Encounter Medications as of 11/03/2022  Medication Sig   albuterol (VENTOLIN HFA) 108 (90 Base) MCG/ACT inhaler 2 puffs q6 prn   ALPRAZolam (XANAX) 0.5 MG tablet Take 1 tablet (0.5 mg total) by mouth 2 (two) times daily.   Aspirin-Acetaminophen-Caffeine (GOODYS EXTRA STRENGTH) 500-325-65 MG PACK Take 1 packet by mouth 3 (three) times daily as needed (pain.).   Calcium Carb-Cholecalciferol (CALCIUM+D3 PO) Take 1 tablet by mouth 3 (three) times a week.   CLENPIQ 10-3.5-12 MG-GM -GM/160ML SOLN Take 320 mLs by mouth as directed.   dicyclomine (BENTYL) 10 MG capsule Take 1 capsule (10 mg total) by mouth 4 (four) times daily as needed for spasms.   diphenhydrAMINE (BENADRYL) 25 MG tablet Take 25 mg by mouth in the morning and at bedtime.   doxylamine, Sleep, (UNISOM) 25 MG tablet Take 25 mg by mouth at bedtime as needed for sleep.   fluticasone (FLONASE) 50 MCG/ACT nasal spray Place 2 sprays into both nostrils daily.   Homeopathic Products (LEG CRAMPS) TABS Take 1-2 tablets by mouth daily as needed (leg cramps).   HYDROcodone-acetaminophen (NORCO) 10-325 MG tablet Take 1 tablet by mouth in the morning, at noon, and at bedtime.   HYDROcodone-acetaminophen (NORCO) 10-325 MG tablet Take 1 tablet by mouth in the morning, at noon, and at bedtime.   HYDROcodone-acetaminophen (NORCO) 10-325 MG tablet Take 1 tablet by mouth in the morning, at noon, and at bedtime.   HYDROcodone-acetaminophen (NORCO) 5-325 MG tablet Take 2 tablets by mouth in the morning, at noon, and at bedtime.   ipratropium (ATROVENT) 0.02 % nebulizer solution USE 1 VIAL BY NEBULIZATION EVERY 6 (SIX) HOURS AS NEEDED FOR WHEEZING OR SHORTNESS OF BREATH.   pantoprazole (PROTONIX) 40 MG tablet TAKE 1 TABLET BY MOUTH TWICE A DAY   PARoxetine (PAXIL) 40 MG tablet TAKE 1 TABLET BY MOUTH EVERY DAY IN THE MORNING   Phenazopyridine HCl (AZO DINE PO) Take 1-2 tablets  by mouth 3 (three) times daily as needed (urinary pain/discomfort).   Polyethyl Glycol-Propyl Glycol (LUBRICANT EYE DROPS) 0.4-0.3 % SOLN Place 1-2 drops into both eyes 3 (three) times daily as needed (dry/irritated eyes.).   SYMBICORT 80-4.5 MCG/ACT inhaler Take 2 puffs first thing in am and then another 2 puffs about 12 hours later.   valsartan-hydrochlorothiazide (DIOVAN-HCT) 160-12.5 MG tablet TAKE 1 TABLET BY MOUTH EVERY DAY   Facility-Administered Encounter Medications as of 11/03/2022  Medication   acyclovir ointment (ZOVIRAX) 5 %    Past Surgical History:  Procedure Laterality Date  APPENDECTOMY     77 years old   BACK SURGERY     X 2   BIOPSY  08/11/2016   Procedure: BIOPSY;  Surgeon: Daneil Dolin, MD;  [facility];  Service: Endoscopy;;  gastric bx's   BIOPSY  07/01/2021   Procedure: BIOPSY;  Surgeon: Daneil Dolin, MD;  [facility];  Service: Endoscopy;;   BREAST SURGERY     biopsy only   CARPAL TUNNEL RELEASE     right hand   CERVICAL BIOPSY  W/ LOOP ELECTRODE EXCISION  02/2011   CIN-2 with focal ectocervical margin involvement. With CIN-1 ECC negative   CHOLECYSTECTOMY  1984   COLONOSCOPY WITH PROPOFOL N/A 07/01/2021   Procedure: COLONOSCOPY WITH PROPOFOL;  Surgeon: Daneil Dolin, MD;  [facility];  Service: Endoscopy;  Laterality: N/A;  1:00pm   ESOPHAGOGASTRODUODENOSCOPY  12/01/2011   Hiatal hernia/Mild fibrotic pyloric stenosis status post dilation with passage of  the scope.  The previously noted gastric ulcer completely healed  The remainder of the gastric mucosa appeared normal   ESOPHAGOGASTRODUODENOSCOPY (EGD) WITH PROPOFOL N/A 08/11/2016   Procedure: ESOPHAGOGASTRODUODENOSCOPY (EGD) WITH PROPOFOL;  Surgeon: Daneil Dolin, MD;  [facility];  Service: Endoscopy;  Laterality: N/A;  2:15 PM   ESOPHAGOGASTRODUODENOSCOPY (EGD) WITH PROPOFOL N/A 07/01/2021   Procedure: ESOPHAGOGASTRODUODENOSCOPY (EGD) WITH PROPOFOL;   Surgeon: Daneil Dolin, MD;  [facility];  Service: Endoscopy;  Laterality: N/A;   FRACTURE SURGERY  10/18/2016   left knee in 3 places   [place]  07/28/2011   Schatzki's ring s/p 50F with small UES tear as well, small hh, 1cm pyloric channel ulcer, bx benign without H.Pylori. Mobic/goody's at time.   MALONEY DILATION N/A 08/11/2016   Procedure: Venia Minks DILATION;  Surgeon: Daneil Dolin, MD;  [facility];  Service: Endoscopy;  Laterality: N/A;   MALONEY DILATION N/A 07/01/2021   Procedure: Venia Minks DILATION;  Surgeon: Daneil Dolin, MD;  [facility];  Service: Endoscopy;  Laterality: N/A;   ORIF PATELLA Left 10/19/2016   Procedure: OPEN REDUCTION INTERNAL (ORIF) FIXATION PATELLA;  Surgeon: Carole Civil, MD;  [facility];  Service: Orthopedics;  Laterality: Left;   POLYPECTOMY  07/01/2021   Procedure: POLYPECTOMY;  Surgeon: Daneil Dolin, MD;  [facility];  Service: Endoscopy;;   SPINE SURGERY  2009   cervical disc   SPINE SURGERY  2009   lumbar   TUBAL LIGATION  1976   tumor removed from arm      Family History  Problem Relation Age of Onset   Heart disease Father        CHF   Heart disease Mother    COPD Sister    Diabetes Sister    Neuropathy Sister    COPD Sister    COPD Sister    Cancer Sister    Stroke Brother    Heart disease Brother    Colon cancer Neg Hx    Anesthesia problems Neg Hx    Hypotension Neg Hx    Malignant hyperthermia Neg Hx    Pseudochol deficiency Neg Hx    Colon polyps Neg Hx            Review of Systems  Constitutional:  Negative for diaphoresis.  Eyes:  Negative for pain.  Respiratory:  Negative for shortness of breath.   Cardiovascular:  Negative for chest pain, palpitations and leg swelling.  Gastrointestinal:  Negative for abdominal pain.  Endocrine: Negative for polydipsia.  Skin:  Negative for rash.  Neurological:  Negative for dizziness, weakness and  headaches.  Hematological:  Does not bruise/bleed easily.  All other systems reviewed and are negative.      Objective:   Physical Exam Vitals and nursing note reviewed.  Constitutional:      General: She is not in acute distress.    Appearance: Normal appearance. She is well-developed.  HENT:     Head: Normocephalic.     Right Ear: Tympanic membrane normal.     Left Ear: Tympanic membrane normal.     Nose: Nose normal.     Mouth/Throat:     Mouth: Mucous membranes are moist.  Eyes:     Pupils: Pupils are equal, round, and reactive to light.  Neck:     Vascular: No carotid bruit or JVD.  Cardiovascular:     Rate and Rhythm: Normal rate and regular rhythm.     Heart sounds: Normal heart sounds.  Pulmonary:     Effort: Pulmonary effort is normal. No respiratory distress.     Breath sounds: Normal breath sounds. No wheezing or rales.  Chest:     Chest wall: No tenderness.  Abdominal:     General: Bowel sounds are normal. There is no distension or abdominal bruit.     Palpations: Abdomen is soft. There is no hepatomegaly, splenomegaly, mass or pulsatile mass.     Tenderness: There is no abdominal tenderness.  Musculoskeletal:        General: Normal range of motion.     Cervical back: Normal range of motion and neck supple.  Lymphadenopathy:     Cervical: No cervical adenopathy.  Skin:    General: Skin is warm and dry.  Neurological:     Mental Status: She is alert and oriented to person, place, and time.     Deep Tendon Reflexes: Reflexes are normal and symmetric.  Psychiatric:        Behavior: Behavior normal.        Thought Content: Thought content normal.        Judgment: Judgment normal.     BP 133/79   Pulse 85   Temp (!) 96.9 F (36.1 C) (Temporal)   Resp 20   Ht '5\' 5"'$  (1.651 m)   Wt 181 lb (82.1 kg)   SpO2 90%   BMI 30.12 kg/m        Assessment & Plan:  HAZLEY DEZEEUW comes in today with chief complaint of Medical Management of Chronic  Issues   Diagnosis and orders addressed:  1. Benign hypertension Low sodium diet - valsartan-hydrochlorothiazide (DIOVAN-HCT) 160-12.5 MG tablet; Take 1 tablet by mouth daily.  Dispense: 90 tablet; Refill: 1  2. Hyperlipidemia with target LDL less than 100 Low fat diet  3. Aortic atherosclerosis ([place])  4. COPD GOLD ? / active smoker  Smoking cessation encouraged - SYMBICORT 80-4.5 MCG/ACT inhaler; Take 2 puffs first thing in am and then another 2 puffs about 12 hours later.  Dispense: 1 each; Refill: 12  5. Dysphagia, unspecified type Chew food well  6. Gastroesophageal reflux disease, unspecified whether esophagitis present Avoid spicy foods Do not eat 2 hours prior to bedtime - pantoprazole (PROTONIX) 40 MG tablet; Take 1 tablet (40 mg total) by mouth 2 (two) times daily.  Dispense: 180 tablet; Refill: 1  7. Irritable bowel syndrome with diarrhea Wathc diet to prevent flare ups - dicyclomine (BENTYL) 10 MG capsule;  Take 1 capsule (10 mg total) by mouth 4 (four) times daily as needed for spasms.  Dispense: 360 capsule; Refill: 1  8. Anxiety Stress management - ALPRAZolam (XANAX) 0.5 MG tablet; Take 1 tablet (0.5 mg total) by mouth 2 (two) times daily.  Dispense: 60 tablet; Refill: 2  9. Recurrent major depressive disorder, in full remission ([place]) - PARoxetine (PAXIL) 40 MG tablet; TAKE 1 TABLET BY MOUTH EVERY DAY IN THE MORNING  Dispense: 90 tablet; Refill: 1  10. Hypokalemia Labs pending  11. Cigarette smoker Again refuses  to quit smoking  12. Chronic midline low back pain without sciatica - HYDROcodone-acetaminophen (NORCO) 10-325 MG tablet; Take 1 tablet by mouth in the morning, at noon, and at bedtime.  Dispense: 90 tablet; Refill: 0 - HYDROcodone-acetaminophen (NORCO) 10-325 MG tablet; Take 1 tablet by mouth in the morning, at noon, and at bedtime.  Dispense: 90 tablet; Refill: 0 - HYDROcodone-acetaminophen (NORCO) 10-325 MG tablet; Take 1 tablet by mouth in the  morning, at noon, and at bedtime.  Dispense: 90 tablet; Refill: 0  13. BMI 32.0-32.9,adult Discussed diet and exercise for person with BMI >25 Will recheck weight in 3-6 months   14. COPD (chronic obstructive pulmonary disease) with chronic bronchitis - ipratropium (ATROVENT) 0.02 % nebulizer solution; USE 1 VIAL BY NEBULIZATION EVERY 6 (SIX) HOURS AS NEEDED FOR WHEEZING OR SHORTNESS OF BREATH.  Dispense: 62.5 mL; Refill: 2   Labs pending Health Maintenance reviewed Diet and exercise encouraged  Follow up plan: 3 months   [name], FNP

## 2022-11-03 NOTE — Patient Instructions (Signed)

## 2022-11-03 NOTE — Telephone Encounter (Signed)
[facility] called to follow up on an oxygen re-certification  order that was sent on January 29th. [name] patient.

## 2022-11-04 LAB — CBC WITH DIFFERENTIAL/PLATELET
Basophils Absolute: 0.1 10*3/uL (ref 0.0–0.2)
Basos: 1 %
EOS (ABSOLUTE): 0.2 10*3/uL (ref 0.0–0.4)
Eos: 2 %
Hematocrit: 45.5 % (ref 34.0–46.6)
Hemoglobin: 15.1 g/dL (ref 11.1–15.9)
Immature Grans (Abs): 0 10*3/uL (ref 0.0–0.1)
Immature Granulocytes: 0 %
Lymphocytes Absolute: 2 10*3/uL (ref 0.7–3.1)
Lymphs: 26 %
MCH: 29.3 pg (ref 26.6–33.0)
MCHC: 33.2 g/dL (ref 31.5–35.7)
MCV: 88 fL (ref 79–97)
Monocytes Absolute: 0.6 10*3/uL (ref 0.1–0.9)
Monocytes: 8 %
Neutrophils Absolute: 4.6 10*3/uL (ref 1.4–7.0)
Neutrophils: 63 %
Platelets: 237 10*3/uL (ref 150–450)
RBC: 5.16 x10E6/uL (ref 3.77–5.28)
RDW: 13.1 % (ref 11.7–15.4)
WBC: 7.4 10*3/uL (ref 3.4–10.8)

## 2022-11-04 LAB — CMP14+EGFR
ALT: 24 IU/L (ref 0–32)
AST: 102 IU/L — ABNORMAL HIGH (ref 0–40)
Albumin/Globulin Ratio: 2 (ref 1.2–2.2)
Albumin: 4.3 g/dL (ref 3.8–4.8)
Alkaline Phosphatase: 127 IU/L — ABNORMAL HIGH (ref 44–121)
BUN/Creatinine Ratio: 12 (ref 12–28)
BUN: 6 mg/dL — ABNORMAL LOW (ref 8–27)
Bilirubin Total: 0.6 mg/dL (ref 0.0–1.2)
CO2: 29 mmol/L (ref 20–29)
Calcium: 9.9 mg/dL (ref 8.7–10.3)
Chloride: 95 mmol/L — ABNORMAL LOW (ref 96–106)
Creatinine, Ser: 0.52 mg/dL — ABNORMAL LOW (ref 0.57–1.00)
Globulin, Total: 2.1 g/dL (ref 1.5–4.5)
Glucose: 91 mg/dL (ref 70–99)
Potassium: 4.1 mmol/L (ref 3.5–5.2)
Sodium: 137 mmol/L (ref 134–144)
Total Protein: 6.4 g/dL (ref 6.0–8.5)
eGFR: 97 mL/min/{1.73_m2} (ref >59)

## 2022-11-04 LAB — LIPID PANEL
Chol/HDL Ratio: 2.7 ratio (ref 0.0–4.4)
Cholesterol, Total: 210 mg/dL — ABNORMAL HIGH (ref 100–199)
HDL: 77 mg/dL (ref >39)
LDL Chol Calc (NIH): 110 mg/dL — ABNORMAL HIGH (ref 0–99)
Triglycerides: 135 mg/dL (ref 0–149)
VLDL Cholesterol Cal: 23 mg/dL (ref 5–40)

## 2022-11-04 NOTE — Telephone Encounter (Signed)
RTC to [employer] [employer], Standard written order Oxygen was faxed back today.

## 2022-11-17 ENCOUNTER — Other Ambulatory Visit: Payer: Self-pay | Admitting: Nurse Practitioner

## 2022-11-17 DIAGNOSIS — J4489 Other specified chronic obstructive pulmonary disease: Secondary | ICD-10-CM

## 2022-11-28 ENCOUNTER — Telehealth: Payer: Self-pay | Admitting: Nurse Practitioner

## 2022-11-28 NOTE — Telephone Encounter (Signed)
[place] to schedule their annual wellness visit. Appointment made for 12/14/2022.  Thank you,  Colletta Maryland,  [place] [facility] Direct Dial ??[phone]

## 2022-12-01 ENCOUNTER — Other Ambulatory Visit: Payer: Self-pay | Admitting: Nurse Practitioner

## 2022-12-01 DIAGNOSIS — J449 Chronic obstructive pulmonary disease, unspecified: Secondary | ICD-10-CM

## 2022-12-02 DIAGNOSIS — J441 Chronic obstructive pulmonary disease with (acute) exacerbation: Secondary | ICD-10-CM | POA: Diagnosis not present

## 2022-12-14 ENCOUNTER — Ambulatory Visit: Payer: 59

## 2023-01-05 ENCOUNTER — Telehealth: Payer: Self-pay | Admitting: Nurse Practitioner

## 2023-01-05 NOTE — Telephone Encounter (Signed)
Called patient to schedule Medicare Annual Wellness Visit (AWV). Left message for patient to call back and schedule Medicare Annual Wellness Visit (AWV).  Last date of AWV: 05/25/2021   Please schedule an appointment at any time with either Mickel Baas or [place], [facility]. .  If any questions, please contact me at [phone].  Thank you,  Colletta [place],  [place] [facility] Direct Dial ??[phone]

## 2023-01-22 DIAGNOSIS — J441 Chronic obstructive pulmonary disease with (acute) exacerbation: Secondary | ICD-10-CM | POA: Diagnosis not present

## 2023-02-01 ENCOUNTER — Telehealth: Payer: Self-pay | Admitting: Nurse Practitioner

## 2023-02-01 NOTE — Telephone Encounter (Signed)
Contacted [name] to schedule their annual wellness visit. Appointment made for 02/15/2023.  Thank you,  [name],  AMB Clinical Support [facility] AWV Program Direct Dial ??[phone]

## 2023-02-02 ENCOUNTER — Other Ambulatory Visit: Payer: Self-pay | Admitting: Nurse Practitioner

## 2023-02-02 DIAGNOSIS — F419 Anxiety disorder, unspecified: Secondary | ICD-10-CM

## 2023-02-06 ENCOUNTER — Other Ambulatory Visit: Payer: Self-pay | Admitting: Nurse Practitioner

## 2023-02-06 DIAGNOSIS — F419 Anxiety disorder, unspecified: Secondary | ICD-10-CM

## 2023-02-07 ENCOUNTER — Other Ambulatory Visit: Payer: Self-pay | Admitting: Nurse Practitioner

## 2023-02-07 ENCOUNTER — Telehealth: Payer: Self-pay | Admitting: Nurse Practitioner

## 2023-02-07 DIAGNOSIS — J4489 Other specified chronic obstructive pulmonary disease: Secondary | ICD-10-CM

## 2023-02-07 DIAGNOSIS — F419 Anxiety disorder, unspecified: Secondary | ICD-10-CM

## 2023-02-07 MED ORDER — ALPRAZOLAM 0.5 MG PO TABS
0.5000 mg | ORAL_TABLET | Freq: Two times a day (BID) | ORAL | 0 refills | Status: DC
Start: 2023-02-07 — End: 2023-02-16

## 2023-02-07 NOTE — Telephone Encounter (Signed)
Please review and advise on refill. 

## 2023-02-07 NOTE — Telephone Encounter (Signed)
1 refill of xanax sent to pharmacy

## 2023-02-07 NOTE — Telephone Encounter (Signed)
  Prescription Request  02/07/2023  Is this a "Controlled Substance" medicine? yes  Have you seen your PCP in the last 2 weeks? No pt last seen on 02/16/23, MMM scheduled her next f/u on 02/16/23  If YES, route message to pool  -  If NO, patient needs to be scheduled for appointment.  What is the name of the medication or equipment? ALPRAZolam Prudy Feeler)   Have you contacted your pharmacy to request a refill? yes   Which pharmacy would you like this sent to? [facility]    Patient notified that their request is being sent to the clinical staff for review and that they should receive a response within 2 business days.

## 2023-02-08 NOTE — Telephone Encounter (Signed)
Patient aware rx sent to pharmacy.  

## 2023-02-15 ENCOUNTER — Ambulatory Visit (INDEPENDENT_AMBULATORY_CARE_PROVIDER_SITE_OTHER): Payer: 59

## 2023-02-15 VITALS — Ht 65.0 in | Wt 185.0 lb

## 2023-02-15 DIAGNOSIS — Z0000 Encounter for general adult medical examination without abnormal findings: Secondary | ICD-10-CM

## 2023-02-15 DIAGNOSIS — Z122 Encounter for screening for malignant neoplasm of respiratory organs: Secondary | ICD-10-CM

## 2023-02-15 NOTE — Patient Instructions (Signed)
[name] , Thank you for taking time to come for your Medicare Wellness Visit. I appreciate your ongoing commitment to your health goals. Please review the following plan we discussed and let me know if I can assist you in the future.   These are the goals we discussed:  Goals      DIET - INCREASE WATER INTAKE     Try to drink 6-8 glasses of water daily.     Eat breakfast     Exercise 150 min/wk Moderate Activity     Have 3 meals a day     Increase physical activity        This is a list of the screening recommended for you and due dates:  Health Maintenance  Topic Date Due   COVID-19 Vaccine (1) Never done   Zoster (Shingles) Vaccine (1 of 2) Never done   Screening for Lung Cancer  08/02/2018   DEXA scan (bone density measurement)  04/03/2023   Flu Shot  05/04/2023   Medicare Annual Wellness Visit  02/15/2024   DTaP/Tdap/Td vaccine (2 - Td or Tdap) 01/27/2027   Colon Cancer Screening  07/03/2031   Pneumonia Vaccine  Completed   Hepatitis C Screening: USPSTF Recommendation to screen - Ages 18-79 yo.  Completed   HPV Vaccine  Aged Out    Advanced directives: In Chart   Conditions/risks identified: Aim for 30 minutes of exercise or brisk walking, 6-8 glasses of water, and 5 servings of fruits and vegetables each day.   Next appointment: Follow up in one year for your annual wellness visit    Preventive Care 65 Years and Older, Female Preventive care refers to lifestyle choices and visits with your health care provider that can promote health and wellness. What does preventive care include? A yearly physical exam. This is also called an annual well check. Dental exams once or twice a year. Routine eye exams. Ask your health care provider how often you should have your eyes checked. Personal lifestyle choices, including: Daily care of your teeth and gums. Regular physical activity. Eating a healthy diet. Avoiding tobacco and drug use. Limiting alcohol  use. Practicing safe sex. Taking low-dose aspirin every day. Taking vitamin and mineral supplements as recommended by your health care provider. What happens during an annual well check? The services and screenings done by your health care provider during your annual well check will depend on your age, overall health, lifestyle risk factors, and family history of disease. Counseling  Your health care provider may ask you questions about your: Alcohol use. Tobacco use. Drug use. Emotional well-being. Home and relationship well-being. Sexual activity. Eating habits. History of falls. Memory and ability to understand (cognition). Work and work Astronomer. Reproductive health. Screening  You may have the following tests or measurements: Height, weight, and BMI. Blood pressure. Lipid and cholesterol levels. These may be checked every 5 years, or more frequently if you are over 3 years old. Skin check. Lung cancer screening. You may have this screening every year starting at age 30 if you have a 30-pack-year history of smoking and currently smoke or have quit within the past 15 years. Fecal occult blood test (FOBT) of the stool. You may have this test every year starting at age 66. Flexible sigmoidoscopy or colonoscopy. You may have a sigmoidoscopy every 5 years or a colonoscopy every 10 years starting at age 32. Hepatitis C blood test. Hepatitis B blood test. Sexually transmitted disease (STD) testing. Diabetes screening. This is done  by checking your blood sugar (glucose) after you have not eaten for a while (fasting). You may have this done every 1-3 years. Bone density scan. This is done to screen for osteoporosis. You may have this done starting at age 24. Mammogram. This may be done every 1-2 years. Talk to your health care provider about how often you should have regular mammograms. Talk with your health care provider about your test results, treatment options, and if necessary,  the need for more tests. Vaccines  Your health care provider may recommend certain vaccines, such as: Influenza vaccine. This is recommended every year. Tetanus, diphtheria, and acellular pertussis (Tdap, Td) vaccine. You may need a Td booster every 10 years. Zoster vaccine. You may need this after age 21. Pneumococcal 13-valent conjugate (PCV13) vaccine. One dose is recommended after age 17. Pneumococcal polysaccharide (PPSV23) vaccine. One dose is recommended after age 72. Talk to your health care provider about which screenings and vaccines you need and how often you need them. This information is not intended to replace advice given to you by your health care provider. Make sure you discuss any questions you have with your health care provider. Document Released: 10/16/2015 Document Revised: 06/08/2016 Document Reviewed: 07/21/2015 Elsevier Interactive Patient Education  2017 [place] Prevention in the Home Falls can cause injuries. They can happen to people of all ages. There are many things you can do to make your home safe and to help prevent falls. What can I do on the outside of my home? Regularly fix the edges of walkways and driveways and fix any cracks. Remove anything that might make you trip as you walk through a door, such as a raised step or threshold. Trim any bushes or trees on the path to your home. Use bright outdoor lighting. Clear any walking paths of anything that might make someone trip, such as rocks or tools. Regularly check to see if handrails are loose or broken. Make sure that both sides of any steps have handrails. Any raised decks and porches should have guardrails on the edges. Have any leaves, snow, or ice cleared regularly. Use sand or salt on walking paths during winter. Clean up any spills in your garage right away. This includes oil or grease spills. What can I do in the bathroom? Use night lights. Install grab bars by the toilet and in the  tub and shower. Do not use towel bars as grab bars. Use non-skid mats or decals in the tub or shower. If you need to sit down in the shower, use a plastic, non-slip stool. Keep the floor dry. Clean up any water that spills on the floor as soon as it happens. Remove soap buildup in the tub or shower regularly. Attach bath mats securely with double-sided non-slip rug tape. Do not have throw rugs and other things on the floor that can make you trip. What can I do in the bedroom? Use night lights. Make sure that you have a light by your bed that is easy to reach. Do not use any sheets or blankets that are too big for your bed. They should not hang down onto the floor. Have a firm chair that has side arms. You can use this for support while you get dressed. Do not have throw rugs and other things on the floor that can make you trip. What can I do in the kitchen? Clean up any spills right away. Avoid walking on wet floors. Keep items that you use  a lot in easy-to-reach places. If you need to reach something above you, use a strong step stool that has a grab bar. Keep electrical cords out of the way. Do not use floor polish or wax that makes floors slippery. If you must use wax, use non-skid floor wax. Do not have throw rugs and other things on the floor that can make you trip. What can I do with my stairs? Do not leave any items on the stairs. Make sure that there are handrails on both sides of the stairs and use them. Fix handrails that are broken or loose. Make sure that handrails are as long as the stairways. Check any carpeting to make sure that it is firmly attached to the stairs. Fix any carpet that is loose or worn. Avoid having throw rugs at the top or bottom of the stairs. If you do have throw rugs, attach them to the floor with carpet tape. Make sure that you have a light switch at the top of the stairs and the bottom of the stairs. If you do not have them, ask someone to add them for  you. What else can I do to help prevent falls? Wear shoes that: Do not have high heels. Have rubber bottoms. Are comfortable and fit you well. Are closed at the toe. Do not wear sandals. If you use a stepladder: Make sure that it is fully opened. Do not climb a closed stepladder. Make sure that both sides of the stepladder are locked into place. Ask someone to hold it for you, if possible. Clearly mark and make sure that you can see: Any grab bars or handrails. First and last steps. Where the edge of each step is. Use tools that help you move around (mobility aids) if they are needed. These include: Canes. Walkers. Scooters. Crutches. Turn on the lights when you go into a dark area. Replace any light bulbs as soon as they burn out. Set up your furniture so you have a clear path. Avoid moving your furniture around. If any of your floors are uneven, fix them. If there are any pets around you, be aware of where they are. Review your medicines with your doctor. Some medicines can make you feel dizzy. This can increase your chance of falling. Ask your doctor what other things that you can do to help prevent falls. This information is not intended to replace advice given to you by your health care provider. Make sure you discuss any questions you have with your health care provider. Document Released: 07/16/2009 Document Revised: 02/25/2016 Document Reviewed: 10/24/2014 Elsevier Interactive Patient Education  2017 [employer].

## 2023-02-15 NOTE — Progress Notes (Signed)
Subjective:   [name] is a 77 y.o. female who presents for Medicare Annual (Subsequent) preventive examination. I connected with  [name] on 02/15/23 by a audio enabled telemedicine application and verified that I am speaking with the correct person using two identifiers.  Patient Location: Home  Provider Location: Home Office  I discussed the limitations of evaluation and management by telemedicine. The patient expressed understanding and agreed to proceed.  Review of Systems     Cardiac Risk Factors include: advanced age (>62men, >65 women);dyslipidemia;hypertension     Objective:    Today's Vitals   02/15/23 1421  Weight: 185 lb (83.9 kg)  Height: 5\' 5"  (1.651 m)   Body mass index is 30.79 kg/m.     02/15/2023    2:24 PM 06/29/2021    2:29 PM 05/24/2020    4:46 PM 05/24/2020    7:36 AM 07/18/2019    1:45 PM 01/31/2018   11:27 AM 11/24/2017   10:09 AM  Advanced Directives  Does Patient Have a Medical Advance Directive? Yes No No No No Yes No  Type of Estate agent of [place];Living will     Living will   Does patient want to make changes to medical advance directive? No - Patient declined     No - Patient declined   Copy of Healthcare Power of Attorney in Chart? Yes - validated most recent copy scanned in chart (See row information)        Would patient like information on creating a medical advance directive?  Yes (MAU/Ambulatory/Procedural Areas - Information given) No - Patient declined  No - Patient declined  No - Patient declined    Current Medications (verified) Outpatient Encounter Medications as of 02/15/2023  Medication Sig   albuterol (VENTOLIN HFA) 108 (90 Base) MCG/ACT inhaler INHALE 2 PUFFS EVERY 6 HOURS AS NEEDED   ALPRAZolam (XANAX) 0.5 MG tablet Take 1 tablet (0.5 mg total) by mouth 2 (two) times daily.   Aspirin-Acetaminophen-Caffeine (GOODYS EXTRA STRENGTH) 500-325-65 MG PACK Take 1 packet by mouth 3 (three)  times daily as needed (pain.).   Calcium Carb-Cholecalciferol (CALCIUM+D3 PO) Take 1 tablet by mouth 3 (three) times a week.   CLENPIQ 10-3.5-12 MG-GM -GM/160ML SOLN Take 320 mLs by mouth as directed.   dicyclomine (BENTYL) 10 MG capsule Take 1 capsule (10 mg total) by mouth 4 (four) times daily as needed for spasms.   diphenhydrAMINE (BENADRYL) 25 MG tablet Take 25 mg by mouth in the morning and at bedtime.   doxylamine, Sleep, (UNISOM) 25 MG tablet Take 25 mg by mouth at bedtime as needed for sleep.   fluticasone (FLONASE) 50 MCG/ACT nasal spray Place 2 sprays into both nostrils daily.   Homeopathic Products (LEG CRAMPS) TABS Take 1-2 tablets by mouth daily as needed (leg cramps).   HYDROcodone-acetaminophen (NORCO) 10-325 MG tablet Take 1 tablet by mouth in the morning, at noon, and at bedtime.   ipratropium (ATROVENT) 0.02 % nebulizer solution USE 1 VIAL BY NEBULIZATION EVERY 6 (SIX) HOURS AS NEEDED FOR WHEEZING OR SHORTNESS OF BREATH.   pantoprazole (PROTONIX) 40 MG tablet Take 1 tablet (40 mg total) by mouth 2 (two) times daily.   PARoxetine (PAXIL) 40 MG tablet TAKE 1 TABLET BY MOUTH EVERY DAY IN THE MORNING   Phenazopyridine HCl (AZO DINE PO) Take 1-2 tablets by mouth 3 (three) times daily as needed (urinary pain/discomfort).   Polyethyl Glycol-Propyl Glycol (LUBRICANT EYE DROPS) 0.4-0.3 % SOLN Place 1-2 drops  into both eyes 3 (three) times daily as needed (dry/irritated eyes.).   SYMBICORT 80-4.5 MCG/ACT inhaler Take 2 puffs first thing in am and then another 2 puffs about 12 hours later.   valsartan-hydrochlorothiazide (DIOVAN-HCT) 160-12.5 MG tablet Take 1 tablet by mouth daily.   HYDROcodone-acetaminophen (NORCO) 10-325 MG tablet Take 1 tablet by mouth in the morning, at noon, and at bedtime.   HYDROcodone-acetaminophen (NORCO) 10-325 MG tablet Take 1 tablet by mouth in the morning, at noon, and at bedtime.   Facility-Administered Encounter Medications as of 02/15/2023  Medication    acyclovir ointment (ZOVIRAX) 5 %    Allergies (verified) Meloxicam   History: Past Medical History:  Diagnosis Date   Angina    with indigestion   Anxiety    Arthritis    all over esp. in joints   Complication of anesthesia    COPD (chronic obstructive pulmonary disease) (HCC)    Depression    Family history of adverse reaction to anesthesia    GERD (gastroesophageal reflux disease)    Headache(784.0)    normal h/a   HPV in female    HTN (hypertension)    Hypercholesteremia    Hypercholesterolemia    IBS (irritable bowel syndrome)    Pneumonia 2007   PONV (postoperative nausea and vomiting)    S/P colonoscopy 05/30/2008   Dr. Arlyce Dice: (records reviewed by AS, NP) findings normal to cecum, scattered diverticula in sigmoid colon    S/P endoscopy 05/15/2008   Dr. Arlyce Dice: (records reviewed by AS,NP): [facility] dilator for stricture distal esophagus, also noted duodenal stricture, op notes state unable to pass 10mm scope through pylorus   Shortness of breath    with exertion   Stomach ulcer    Past Surgical History:  Procedure Laterality Date   APPENDECTOMY     77 years old   BACK SURGERY     X 2   BIOPSY  08/11/2016   Procedure: BIOPSY;  Surgeon: Corbin Ade, MD;  [facility];  Service: Endoscopy;;  gastric bx's   BIOPSY  07/01/2021   Procedure: BIOPSY;  Surgeon: Corbin Ade, MD;  [facility];  Service: Endoscopy;;   BREAST SURGERY     biopsy only   CARPAL TUNNEL RELEASE     right hand   CERVICAL BIOPSY  W/ LOOP ELECTRODE EXCISION  02/2011   CIN-2 with focal ectocervical margin involvement. With CIN-1 ECC negative   CHOLECYSTECTOMY  1984   COLONOSCOPY WITH PROPOFOL N/A 07/01/2021   Procedure: COLONOSCOPY WITH PROPOFOL;  Surgeon: Corbin Ade, MD;  [facility];  Service: Endoscopy;  Laterality: N/A;  1:00pm   ESOPHAGOGASTRODUODENOSCOPY  12/01/2011   Hiatal hernia/Mild fibrotic pyloric stenosis status post dilation with passage of   the scope.  The previously noted gastric ulcer completely healed  The remainder of the gastric mucosa appeared normal   ESOPHAGOGASTRODUODENOSCOPY (EGD) WITH PROPOFOL N/A 08/11/2016   Procedure: ESOPHAGOGASTRODUODENOSCOPY (EGD) WITH PROPOFOL;  Surgeon: Corbin Ade, MD;  [facility];  Service: Endoscopy;  Laterality: N/A;  2:15 PM   ESOPHAGOGASTRODUODENOSCOPY (EGD) WITH PROPOFOL N/A 07/01/2021   Procedure: ESOPHAGOGASTRODUODENOSCOPY (EGD) WITH PROPOFOL;  Surgeon: Corbin Ade, MD;  [facility];  Service: Endoscopy;  Laterality: N/A;   FRACTURE SURGERY  10/18/2016   left knee in 3 places   MALONEY DILATION  07/28/2011   Schatzki's ring s/p 61F with small UES tear as well, small hh, 1cm pyloric channel ulcer, bx benign  without H.Pylori. Mobic/goody's at time.   MALONEY DILATION N/A 08/11/2016   Procedure: Elease Hashimoto DILATION;  Surgeon: Corbin Ade, MD;  [facility];  Service: Endoscopy;  Laterality: N/A;   MALONEY DILATION N/A 07/01/2021   Procedure: Elease Hashimoto DILATION;  Surgeon: Corbin Ade, MD;  [facility];  Service: Endoscopy;  Laterality: N/A;   ORIF PATELLA Left 10/19/2016   Procedure: OPEN REDUCTION INTERNAL (ORIF) FIXATION PATELLA;  Surgeon: Vickki Hearing, MD;  Location: [facility];  Service: Orthopedics;  Laterality: Left;   POLYPECTOMY  07/01/2021   Procedure: POLYPECTOMY;  Surgeon: Corbin Ade, MD;  [facility];  Service: Endoscopy;;   SPINE SURGERY  2009   cervical disc   SPINE SURGERY  2009   lumbar   TUBAL LIGATION  1976   tumor removed from arm     Family History  Problem Relation Age of Onset   Heart disease Father        CHF   Heart disease Mother    COPD Sister    Diabetes Sister    Neuropathy Sister    COPD Sister    COPD Sister    Cancer Sister    Stroke Brother    Heart disease Brother    Colon cancer Neg Hx    Anesthesia problems Neg Hx    Hypotension Neg Hx    Malignant hyperthermia Neg Hx     Pseudochol deficiency Neg Hx    Colon polyps Neg Hx    Social History   Socioeconomic History   Marital status: Widowed    Spouse name: Not on file   Number of children: 5   Years of education: 9   Highest education level: 9th grade  Occupational History   Occupation: disability    Employer: [employer]    Employer: [employer]  Tobacco Use   Smoking status: Every Day    Packs/day: 1.25    Years: 58.00    Additional pack years: 0.00    Total pack years: 72.50    Types: Cigarettes   Smokeless tobacco: Never   Tobacco comments:    1/2 ppd 05/31/21   Vaping Use   Vaping Use: Never used  Substance and Sexual Activity   Alcohol use: No    Comment: hx of ETOH abuse in past, none in 10 years   Drug use: No   Sexual activity: Not Currently    Birth control/protection: Surgical  Other Topics Concern   Not on file  Social History Narrative   Husband passed away 2015-05-01 from lung cancer/leukemia.    Social Determinants of Health   Financial Resource Strain: Low Risk  (02/15/2023)   Overall Financial Resource Strain (CARDIA)    Difficulty of Paying Living Expenses: Not hard at all  Food Insecurity: No Food Insecurity (02/15/2023)   Hunger Vital Sign    Worried About Running Out of Food in the Last Year: Never true    Ran Out of Food in the Last Year: Never true  Transportation Needs: No Transportation Needs (02/15/2023)   PRAPARE - Administrator, Civil Service (Medical): No    Lack of Transportation (Non-Medical): No  Physical Activity: Inactive (02/15/2023)   Exercise Vital Sign    Days of Exercise per Week: 0 days    Minutes of Exercise per Session: 0 min  Stress: No Stress Concern Present (02/15/2023)   [employer] of Occupational Health - Occupational Stress Questionnaire  Feeling of Stress : Not at all  Social Connections: Socially Isolated (02/15/2023)   Social Connection and Isolation Panel [NHANES]    Frequency of Communication with  Friends and Family: More than three times a week    Frequency of Social Gatherings with Friends and Family: More than three times a week    Attends Religious Services: Never    Database administrator or Organizations: No    Attends Banker Meetings: Never    Marital Status: Widowed    Tobacco Counseling Ready to quit: No Counseling given: Not Answered Tobacco comments: 1/2 ppd 05/31/21    Clinical Intake:  Pre-visit preparation completed: Yes  Pain : No/denies pain     Nutritional Risks: None Diabetes: No  How often do you need to have someone help you when you read instructions, pamphlets, or other written materials from your doctor or pharmacy?: 1 - Never  Diabetic?no   Interpreter Needed?: No  Information entered by :: Renie Ora, LPN   Activities of Daily Living    02/15/2023    2:25 PM  In your present state of health, do you have any difficulty performing the following activities:  Hearing? 0  Vision? 0  Difficulty concentrating or making decisions? 0  Walking or climbing stairs? 0  Dressing or bathing? 0  Doing errands, shopping? 0  Preparing Food and eating ? N  Using the Toilet? N  In the past six months, have you accidently leaked urine? N  Do you have problems with loss of bowel control? N  Managing your Medications? N  Managing your Finances? N  Housekeeping or managing your Housekeeping? N    Patient Care Team: Bennie Pierini, FNP as PCP - General (Nurse Practitioner) Corbin Ade, MD (Gastroenterology) Nyoka Cowden, MD as Consulting Physician (Pulmonary Disease) Michaelle Copas, MD as Referring Physician (Optometry)  Indicate any recent Medical Services you may have received from other than [facility] providers in the past year (date may be approximate).     Assessment:   This is a routine wellness examination for [place].  Hearing/Vision screen Vision Screening - Comments:: Wears rx glasses - up to date with  routine eye exams with  Dr.Lee  Dietary issues and exercise activities discussed: Exercise limited by: respiratory conditions(s)   Goals Addressed             This Visit's Progress    DIET - INCREASE WATER INTAKE   On track    Try to drink 6-8 glasses of water daily.       Depression Screen    02/15/2023    2:24 PM 11/03/2022    2:07 PM 07/25/2022    2:11 PM 04/21/2022    2:07 PM 01/18/2022    2:10 PM 10/19/2021    3:28 PM 06/29/2021   11:56 AM  PHQ 2/9 Scores  PHQ - 2 Score 0 1 0 2 1 2 3   PHQ- 9 Score  3 0 4 5 6 6     Fall Risk    02/15/2023    2:23 PM 11/03/2022    2:07 PM 07/25/2022    2:11 PM 04/21/2022    2:07 PM 10/19/2021    3:28 PM  Fall Risk   Falls in the past year? 0 0 0 0 0  Number falls in past yr: 0      Injury with Fall? 0      Risk for fall due to : No Fall Risks  Follow up Falls prevention discussed        FALL RISK PREVENTION PERTAINING TO THE HOME:  Any stairs in or around the home? No  If so, are there any without handrails? No  Home free of loose throw rugs in walkways, pet beds, electrical cords, etc? Yes  Adequate lighting in your home to reduce risk of falls? Yes   ASSISTIVE DEVICES UTILIZED TO PREVENT FALLS:  Life alert? No  Use of a cane, walker or w/c? Yes  Grab bars in the bathroom? Yes  Shower chair or bench in shower? Yes  Elevated toilet seat or a handicapped toilet? Yes       01/31/2018   11:29 AM 01/26/2017   11:28 AM  MMSE - Mini Mental State Exam  Orientation to time 5 5  Orientation to Place 5 5  Registration 3 3  Attention/ Calculation 5 0  Recall 3 1  Language- name 2 objects 2 2  Language- repeat 1 1  Language- follow 3 step command 3 3  Language- read & follow direction 1 1  Write a sentence 1 1  Copy design 1 1  Total score 30 23        02/15/2023    2:25 PM 05/25/2021   11:33 AM 07/18/2019    1:53 PM  6CIT Screen  What Year? 0 points 0 points 4 points  What month? 0 points 0 points 0 points  What  time? 0 points 0 points 0 points  Count back from 20 0 points 0 points 0 points  Months in reverse 0 points 2 points 2 points  Repeat phrase 0 points 2 points 0 points  Total Score 0 points 4 points 6 points    Immunizations Immunization History  Administered Date(s) Administered   Fluad Quad(high Dose 65+) 10/08/2020, 06/18/2022   Influenza Split 11/27/2012   Influenza, High Dose Seasonal PF 07/01/2014, 08/12/2015, 07/12/2017, 07/12/2018, 06/24/2021   Influenza,inj,Quad PF,6+ Mos 06/24/2013, 10/10/2019   Influenza-Unspecified 07/29/2009, 07/22/2010, 09/04/2015   Pneumococcal Conjugate-13 06/24/2014, 09/04/2015   Pneumococcal Polysaccharide-23 11/27/2012, 08/12/2015   Tdap 01/26/2017    TDAP status: Up to date  Flu Vaccine status: Up to date  Pneumococcal vaccine status: Up to date  Covid-19 vaccine status: Completed vaccines  Qualifies for Shingles Vaccine? Yes   Zostavax completed No   Shingrix Completed?: No.    Education has been provided regarding the importance of this vaccine. Patient has been advised to call insurance company to determine out of pocket expense if they have not yet received this vaccine. Advised may also receive vaccine at local pharmacy or Health Dept. Verbalized acceptance and understanding.  Screening Tests Health Maintenance  Topic Date Due   COVID-19 Vaccine (1) Never done   Zoster Vaccines- Shingrix (1 of 2) Never done   Lung Cancer Screening  08/02/2018   DEXA SCAN  04/03/2023   INFLUENZA VACCINE  05/04/2023   Medicare Annual Wellness (AWV)  02/15/2024   DTaP/Tdap/Td (2 - Td or Tdap) 01/27/2027   COLONOSCOPY (Pts 45-2yrs Insurance coverage will need to be confirmed)  07/03/2031   Pneumonia Vaccine 4+ Years old  Completed   Hepatitis C Screening  Completed   HPV VACCINES  Aged Out    Health Maintenance  Health Maintenance Due  Topic Date Due   COVID-19 Vaccine (1) Never done   Zoster Vaccines- Shingrix (1 of 2) Never done   Lung  Cancer Screening  08/02/2018    Colorectal cancer screening: Type of screening: Colonoscopy.  Completed 07/02/2021. Repeat every 10 years  Mammogram status: No longer required due to age .  Bone Density status: Completed 04/02/2021. Results reflect: Bone density results: OSTEOPOROSIS. Repeat every 2 years.  Lung Cancer Screening: (Low Dose CT Chest recommended if Age 19-80 years, 30 pack-year currently smoking OR have quit w/in 15years.) does qualify.   Lung Cancer Screening Referral: referral 02/15/2023  Additional Screening:  Hepatitis C Screening: does not qualify; Completed 07/11/2019  Vision Screening: Recommended annual ophthalmology exams for early detection of glaucoma and other disorders of the eye. Is the patient up to date with their annual eye exam?  Yes  Who is the provider or what is the name of the office in which the patient attends annual eye exams? Dr.Lee  If pt is not established with a provider, would they like to be referred to a provider to establish care? No .   Dental Screening: Recommended annual dental exams for proper oral hygiene  Community Resource Referral / Chronic Care Management: CRR required this visit?  No   CCM required this visit?  No      Plan:     I have personally reviewed and noted the following in the patient's chart:   Medical and social history Use of alcohol, tobacco or illicit drugs  Current medications and supplements including opioid prescriptions. Patient is not currently taking opioid prescriptions. Functional ability and status Nutritional status Physical activity Advanced directives List of other physicians Hospitalizations, surgeries, and ER visits in previous 12 months Vitals Screenings to include cognitive, depression, and falls Referrals and appointments  In addition, I have reviewed and discussed with patient certain preventive protocols, quality metrics, and best practice recommendations. A written personalized  care plan for preventive services as well as general preventive health recommendations were provided to patient.     Lorrene Reid, LPN   [id number]/[id number]   Nurse Notes: none

## 2023-02-16 ENCOUNTER — Encounter: Payer: Self-pay | Admitting: Nurse Practitioner

## 2023-02-16 ENCOUNTER — Ambulatory Visit (INDEPENDENT_AMBULATORY_CARE_PROVIDER_SITE_OTHER): Payer: 59 | Admitting: Nurse Practitioner

## 2023-02-16 VITALS — BP 123/80 | HR 83 | Temp 96.0°F | Ht 65.0 in | Wt 184.0 lb

## 2023-02-16 DIAGNOSIS — F419 Anxiety disorder, unspecified: Secondary | ICD-10-CM | POA: Diagnosis not present

## 2023-02-16 DIAGNOSIS — M545 Low back pain, unspecified: Secondary | ICD-10-CM

## 2023-02-16 DIAGNOSIS — G8929 Other chronic pain: Secondary | ICD-10-CM

## 2023-02-16 MED ORDER — ALPRAZOLAM 0.5 MG PO TABS: 0.5000 mg | ORAL_TABLET | Freq: Two times a day (BID) | ORAL | 2 refills | Status: DC

## 2023-02-16 MED ORDER — HYDROCODONE-ACETAMINOPHEN 10-325 MG PO TABS
1.0000 | ORAL_TABLET | Freq: Three times a day (TID) | ORAL | 0 refills | Status: DC
Start: 2023-02-16 — End: 2023-05-18

## 2023-02-16 MED ORDER — HYDROCODONE-ACETAMINOPHEN 10-325 MG PO TABS
1.0000 | ORAL_TABLET | Freq: Three times a day (TID) | ORAL | 0 refills | Status: DC
Start: 2023-02-16 — End: 2023-05-23

## 2023-02-16 NOTE — Progress Notes (Signed)
Subjective:    Patient ID: [name], female    DOB: Apr 21, 1946, 77 y.o.   MRN: [id number]   Chief Complaint: pain management   HPI:  [name] is a 77 y.o. who identifies as a female who was assigned female at birth.   Social history: Lives with: by herself Work history: disability   Comes in today for follow up of the following chronic medical issues:  1. Chronic midline low back pain without sciatica Pain assessment: Cause of pain- DDD Pain location- lower back Pain on scale of 1-10- 7-8/10 Frequency- daily What increases pain-to much activity What makes pain Better-rest helps Effects on ADL - none Any change in general medical condition-none  Current opioids rx- norco 10/325 TID # meds rx- 90 Effectiveness of current meds-none Adverse reactions from pain meds-none Morphine equivalent- 30 MME  Pill count performed-No Last drug screen - 01/18/22 ( high risk q23m, moderate risk q91m, low risk yearly ) Urine drug screen today- Yes Was the [facility] reviewed- yes  If yes were their any concerning findings? - no   Overdose risk: 1  Pain contract signed on:02/16/23- drug screen done today   2. Anxiety Patient stays anxious all the time. Worries about everything. Is on xanax BID. I have tried taking patient off of xanax since on pain medication, but she get seven more anxious if I suggest taking it away from her.    02/16/2023    2:03 PM 11/03/2022    2:08 PM 07/25/2022    2:11 PM 04/21/2022    2:08 PM  GAD 7 : Generalized Anxiety Score  Nervous, Anxious, on Edge 1 1 0 1  Control/stop worrying 0 0 0 1  Worry too much - different things 0 0 0 1  Trouble relaxing 0 0 0 1  Restless 0 0 0 0  Easily annoyed or irritable 0 0 0 0  Afraid - awful might happen 0 0 0 0  Total GAD 7 Score 1 1 0 4  Anxiety Difficulty Not difficult at all Not difficult at all Not difficult at all Not difficult at all      New complaints: None today  Allergies   Allergen Reactions   Meloxicam Other (See Comments)    Stomach ulcers   Outpatient Encounter Medications as of 02/16/2023  Medication Sig   albuterol (VENTOLIN HFA) 108 (90 Base) MCG/ACT inhaler INHALE 2 PUFFS EVERY 6 HOURS AS NEEDED   ALPRAZolam (XANAX) 0.5 MG tablet Take 1 tablet (0.5 mg total) by mouth 2 (two) times daily.   Aspirin-Acetaminophen-Caffeine (GOODYS EXTRA STRENGTH) 500-325-65 MG PACK Take 1 packet by mouth 3 (three) times daily as needed (pain.).   Calcium Carb-Cholecalciferol (CALCIUM+D3 PO) Take 1 tablet by mouth 3 (three) times a week.   CLENPIQ 10-3.5-12 MG-GM -GM/160ML SOLN Take 320 mLs by mouth as directed.   dicyclomine (BENTYL) 10 MG capsule Take 1 capsule (10 mg total) by mouth 4 (four) times daily as needed for spasms.   diphenhydrAMINE (BENADRYL) 25 MG tablet Take 25 mg by mouth in the morning and at bedtime.   doxylamine, Sleep, (UNISOM) 25 MG tablet Take 25 mg by mouth at bedtime as needed for sleep.   fluticasone (FLONASE) 50 MCG/ACT nasal spray Place 2 sprays into both nostrils daily.   Homeopathic Products (LEG CRAMPS) TABS Take 1-2 tablets by mouth daily as needed (leg cramps).   HYDROcodone-acetaminophen (NORCO) 10-325 MG tablet Take 1 tablet by mouth in the morning, at noon, and  at bedtime.   HYDROcodone-acetaminophen (NORCO) 10-325 MG tablet Take 1 tablet by mouth in the morning, at noon, and at bedtime.   HYDROcodone-acetaminophen (NORCO) 10-325 MG tablet Take 1 tablet by mouth in the morning, at noon, and at bedtime.   ipratropium (ATROVENT) 0.02 % nebulizer solution USE 1 VIAL BY NEBULIZATION EVERY 6 (SIX) HOURS AS NEEDED FOR WHEEZING OR SHORTNESS OF BREATH.   pantoprazole (PROTONIX) 40 MG tablet Take 1 tablet (40 mg total) by mouth 2 (two) times daily.   PARoxetine (PAXIL) 40 MG tablet TAKE 1 TABLET BY MOUTH EVERY DAY IN THE MORNING   Phenazopyridine HCl (AZO DINE PO) Take 1-2 tablets by mouth 3 (three) times daily as needed (urinary pain/discomfort).    Polyethyl Glycol-Propyl Glycol (LUBRICANT EYE DROPS) 0.4-0.3 % SOLN Place 1-2 drops into both eyes 3 (three) times daily as needed (dry/irritated eyes.).   SYMBICORT 80-4.5 MCG/ACT inhaler Take 2 puffs first thing in am and then another 2 puffs about 12 hours later.   valsartan-hydrochlorothiazide (DIOVAN-HCT) 160-12.5 MG tablet Take 1 tablet by mouth daily.   Facility-Administered Encounter Medications as of 02/16/2023  Medication   acyclovir ointment (ZOVIRAX) 5 %    Past Surgical History:  Procedure Laterality Date   APPENDECTOMY     77 years old   BACK SURGERY     X 2   BIOPSY  08/11/2016   Procedure: BIOPSY;  Surgeon: Corbin Ade, MD;  [facility];  Service: Endoscopy;;  gastric bx's   BIOPSY  07/01/2021   Procedure: BIOPSY;  Surgeon: Corbin Ade, MD;  [facility];  Service: Endoscopy;;   BREAST SURGERY     biopsy only   CARPAL TUNNEL RELEASE     right hand   CERVICAL BIOPSY  W/ LOOP ELECTRODE EXCISION  02/2011   CIN-2 with focal ectocervical margin involvement. With CIN-1 ECC negative   CHOLECYSTECTOMY  1984   COLONOSCOPY WITH PROPOFOL N/A 07/01/2021   Procedure: COLONOSCOPY WITH PROPOFOL;  Surgeon: Corbin Ade, MD;  [facility];  Service: Endoscopy;  Laterality: N/A;  1:00pm   ESOPHAGOGASTRODUODENOSCOPY  12/01/2011   Hiatal hernia/Mild fibrotic pyloric stenosis status post dilation with passage of  the scope.  The previously noted gastric ulcer completely healed  The remainder of the gastric mucosa appeared normal   ESOPHAGOGASTRODUODENOSCOPY (EGD) WITH PROPOFOL N/A 08/11/2016   Procedure: ESOPHAGOGASTRODUODENOSCOPY (EGD) WITH PROPOFOL;  Surgeon: Corbin Ade, MD;  [facility];  Service: Endoscopy;  Laterality: N/A;  2:15 PM   ESOPHAGOGASTRODUODENOSCOPY (EGD) WITH PROPOFOL N/A 07/01/2021   Procedure: ESOPHAGOGASTRODUODENOSCOPY (EGD) WITH PROPOFOL;  Surgeon: Corbin Ade, MD;  [facility];  Service:  Endoscopy;  Laterality: N/A;   FRACTURE SURGERY  10/18/2016   left knee in 3 places   MALONEY DILATION  07/28/2011   Schatzki's ring s/p 52F with small UES tear as well, small hh, 1cm pyloric channel ulcer, bx benign without H.Pylori. Mobic/goody's at time.   MALONEY DILATION N/A 08/11/2016   Procedure: Elease Hashimoto DILATION;  Surgeon: Corbin Ade, MD;  [facility];  Service: Endoscopy;  Laterality: N/A;   MALONEY DILATION N/A 07/01/2021   Procedure: Elease Hashimoto DILATION;  Surgeon: Corbin Ade, MD;  [facility];  Service: Endoscopy;  Laterality: N/A;   ORIF PATELLA Left 10/19/2016   Procedure: OPEN REDUCTION INTERNAL (ORIF) FIXATION PATELLA;  Surgeon: Vickki Hearing, MD;  Location: [facility];  Service: Orthopedics;  Laterality: Left;  POLYPECTOMY  07/01/2021   Procedure: POLYPECTOMY;  Surgeon: Corbin Ade, MD;  [facility];  Service: Endoscopy;;   SPINE SURGERY  2009   cervical disc   SPINE SURGERY  2009   lumbar   TUBAL LIGATION  1976   tumor removed from arm      Family History  Problem Relation Age of Onset   Heart disease Father        CHF   Heart disease Mother    COPD Sister    Diabetes Sister    Neuropathy Sister    COPD Sister    COPD Sister    Cancer Sister    Stroke Brother    Heart disease Brother    Colon cancer Neg Hx    Anesthesia problems Neg Hx    Hypotension Neg Hx    Malignant hyperthermia Neg Hx    Pseudochol deficiency Neg Hx    Colon polyps Neg Hx            Review of Systems  Constitutional:  Negative for diaphoresis.  Eyes:  Negative for pain.  Respiratory:  Positive for cough. Negative for shortness of breath.   Cardiovascular:  Negative for chest pain, palpitations and leg swelling.  Gastrointestinal:  Negative for abdominal pain.  Endocrine: Negative for polydipsia.  Skin:  Negative for rash.  Neurological:  Negative for dizziness, weakness and headaches.  Hematological:  Does not bruise/bleed  easily.  All other systems reviewed and are negative.      Objective:   Physical Exam Vitals reviewed.  Constitutional:      Appearance: Normal appearance.  Cardiovascular:     Rate and Rhythm: Normal rate and regular rhythm.     Heart sounds: Normal heart sounds.  Pulmonary:     Effort: Pulmonary effort is normal.     Breath sounds: Rales (fine in bil bases) present.  Skin:    General: Skin is warm.  Neurological:     General: No focal deficit present.     Mental Status: She is alert and oriented to person, place, and time.  Psychiatric:        Mood and Affect: Mood normal.        Behavior: Behavior normal.     BP 123/80   Pulse 83   Temp (!) 96 F (35.6 C) (Temporal)   Ht 5\' 5"  (1.651 m)   Wt 184 lb (83.5 kg)   SpO2 92%   BMI 30.62 kg/m        Assessment & Plan:   KIAUNDRA KAWAMURA in today with chief complaint of Pain Management   1. Chronic midline low back pain without sciatica  - HYDROcodone-acetaminophen (NORCO) 10-325 MG tablet; Take 1 tablet by mouth in the morning, at noon, and at bedtime.  Dispense: 90 tablet; Refill: 0 - HYDROcodone-acetaminophen (NORCO) 10-325 MG tablet; Take 1 tablet by mouth in the morning, at noon, and at bedtime.  Dispense: 90 tablet; Refill: 0 - HYDROcodone-acetaminophen (NORCO) 10-325 MG tablet; Take 1 tablet by mouth in the morning, at noon, and at bedtime.  Dispense: 90 tablet; Refill: 0 - ToxASSURE Select 13 (MW), Urine  2. Anxiety Stress management - ALPRAZolam (XANAX) 0.5 MG tablet; Take 1 tablet (0.5 mg total) by mouth 2 (two) times daily.  Dispense: 60 tablet; Refill: 2    The above assessment and management plan was discussed with the patient. The patient verbalized understanding of and has agreed to the management plan. Patient is aware  to call the clinic if symptoms persist or worsen. Patient is aware when to return to the clinic for a follow-up visit. Patient educated on when it is appropriate to go to the  emergency department.   Mary-Margaret Daphine Deutscher, FNP

## 2023-02-16 NOTE — Patient Instructions (Signed)

## 2023-02-21 DIAGNOSIS — J441 Chronic obstructive pulmonary disease with (acute) exacerbation: Secondary | ICD-10-CM | POA: Diagnosis not present

## 2023-02-21 LAB — TOXASSURE SELECT 13 (MW), URINE

## 2023-03-05 ENCOUNTER — Other Ambulatory Visit: Payer: Self-pay | Admitting: Nurse Practitioner

## 2023-03-05 DIAGNOSIS — F419 Anxiety disorder, unspecified: Secondary | ICD-10-CM

## 2023-03-15 ENCOUNTER — Other Ambulatory Visit: Payer: Self-pay | Admitting: Nurse Practitioner

## 2023-03-15 DIAGNOSIS — J4489 Other specified chronic obstructive pulmonary disease: Secondary | ICD-10-CM

## 2023-03-24 DIAGNOSIS — J441 Chronic obstructive pulmonary disease with (acute) exacerbation: Secondary | ICD-10-CM | POA: Diagnosis not present

## 2023-04-14 ENCOUNTER — Other Ambulatory Visit: Payer: Self-pay | Admitting: Nurse Practitioner

## 2023-04-14 DIAGNOSIS — J4489 Other specified chronic obstructive pulmonary disease: Secondary | ICD-10-CM

## 2023-04-15 DIAGNOSIS — J441 Chronic obstructive pulmonary disease with (acute) exacerbation: Secondary | ICD-10-CM | POA: Diagnosis not present

## 2023-04-15 DIAGNOSIS — N3001 Acute cystitis with hematuria: Secondary | ICD-10-CM | POA: Diagnosis not present

## 2023-04-15 DIAGNOSIS — K6389 Other specified diseases of intestine: Secondary | ICD-10-CM | POA: Diagnosis not present

## 2023-04-15 DIAGNOSIS — Z978 Presence of other specified devices: Secondary | ICD-10-CM | POA: Diagnosis not present

## 2023-04-15 DIAGNOSIS — K567 Ileus, unspecified: Secondary | ICD-10-CM | POA: Diagnosis not present

## 2023-04-15 DIAGNOSIS — K5730 Diverticulosis of large intestine without perforation or abscess without bleeding: Secondary | ICD-10-CM | POA: Diagnosis not present

## 2023-04-15 DIAGNOSIS — Z9049 Acquired absence of other specified parts of digestive tract: Secondary | ICD-10-CM | POA: Diagnosis not present

## 2023-04-15 DIAGNOSIS — E876 Hypokalemia: Secondary | ICD-10-CM | POA: Diagnosis not present

## 2023-04-15 DIAGNOSIS — N2889 Other specified disorders of kidney and ureter: Secondary | ICD-10-CM | POA: Diagnosis not present

## 2023-04-15 DIAGNOSIS — Z9981 Dependence on supplemental oxygen: Secondary | ICD-10-CM | POA: Diagnosis not present

## 2023-04-15 DIAGNOSIS — I700 Atherosclerosis of aorta: Secondary | ICD-10-CM | POA: Diagnosis not present

## 2023-04-15 DIAGNOSIS — I878 Other specified disorders of veins: Secondary | ICD-10-CM | POA: Diagnosis not present

## 2023-04-15 DIAGNOSIS — K3189 Other diseases of stomach and duodenum: Secondary | ICD-10-CM | POA: Diagnosis not present

## 2023-04-15 DIAGNOSIS — J449 Chronic obstructive pulmonary disease, unspecified: Secondary | ICD-10-CM | POA: Diagnosis not present

## 2023-04-15 DIAGNOSIS — R0602 Shortness of breath: Secondary | ICD-10-CM | POA: Diagnosis not present

## 2023-04-15 DIAGNOSIS — I3139 Other pericardial effusion (noninflammatory): Secondary | ICD-10-CM | POA: Diagnosis not present

## 2023-04-15 DIAGNOSIS — I2510 Atherosclerotic heart disease of native coronary artery without angina pectoris: Secondary | ICD-10-CM | POA: Diagnosis not present

## 2023-04-15 DIAGNOSIS — Z792 Long term (current) use of antibiotics: Secondary | ICD-10-CM | POA: Diagnosis not present

## 2023-04-15 DIAGNOSIS — K565 Intestinal adhesions [bands], unspecified as to partial versus complete obstruction: Secondary | ICD-10-CM | POA: Diagnosis not present

## 2023-04-15 DIAGNOSIS — K219 Gastro-esophageal reflux disease without esophagitis: Secondary | ICD-10-CM | POA: Diagnosis not present

## 2023-04-15 DIAGNOSIS — I517 Cardiomegaly: Secondary | ICD-10-CM | POA: Diagnosis not present

## 2023-04-15 DIAGNOSIS — Z7951 Long term (current) use of inhaled steroids: Secondary | ICD-10-CM | POA: Diagnosis not present

## 2023-04-15 DIAGNOSIS — J9811 Atelectasis: Secondary | ICD-10-CM | POA: Diagnosis not present

## 2023-04-15 DIAGNOSIS — N281 Cyst of kidney, acquired: Secondary | ICD-10-CM | POA: Diagnosis not present

## 2023-04-15 DIAGNOSIS — I77810 Thoracic aortic ectasia: Secondary | ICD-10-CM | POA: Diagnosis not present

## 2023-04-15 DIAGNOSIS — Z7952 Long term (current) use of systemic steroids: Secondary | ICD-10-CM | POA: Diagnosis not present

## 2023-04-15 DIAGNOSIS — F17210 Nicotine dependence, cigarettes, uncomplicated: Secondary | ICD-10-CM | POA: Diagnosis not present

## 2023-04-15 DIAGNOSIS — R112 Nausea with vomiting, unspecified: Secondary | ICD-10-CM | POA: Diagnosis not present

## 2023-04-15 DIAGNOSIS — R109 Unspecified abdominal pain: Secondary | ICD-10-CM | POA: Diagnosis not present

## 2023-04-15 DIAGNOSIS — R079 Chest pain, unspecified: Secondary | ICD-10-CM | POA: Diagnosis not present

## 2023-04-15 DIAGNOSIS — K56609 Unspecified intestinal obstruction, unspecified as to partial versus complete obstruction: Secondary | ICD-10-CM | POA: Diagnosis not present

## 2023-04-15 DIAGNOSIS — R0902 Hypoxemia: Secondary | ICD-10-CM | POA: Diagnosis not present

## 2023-04-15 DIAGNOSIS — N3000 Acute cystitis without hematuria: Secondary | ICD-10-CM | POA: Diagnosis not present

## 2023-04-15 DIAGNOSIS — Z79899 Other long term (current) drug therapy: Secondary | ICD-10-CM | POA: Diagnosis not present

## 2023-04-15 DIAGNOSIS — Z4682 Encounter for fitting and adjustment of non-vascular catheter: Secondary | ICD-10-CM | POA: Diagnosis not present

## 2023-04-15 DIAGNOSIS — Z66 Do not resuscitate: Secondary | ICD-10-CM | POA: Diagnosis not present

## 2023-04-15 DIAGNOSIS — K589 Irritable bowel syndrome without diarrhea: Secondary | ICD-10-CM | POA: Diagnosis not present

## 2023-04-15 DIAGNOSIS — Z720 Tobacco use: Secondary | ICD-10-CM | POA: Diagnosis not present

## 2023-04-15 DIAGNOSIS — R140 Abdominal distension (gaseous): Secondary | ICD-10-CM | POA: Diagnosis not present

## 2023-04-15 DIAGNOSIS — R0989 Other specified symptoms and signs involving the circulatory and respiratory systems: Secondary | ICD-10-CM | POA: Diagnosis not present

## 2023-04-15 DIAGNOSIS — Z7901 Long term (current) use of anticoagulants: Secondary | ICD-10-CM | POA: Diagnosis not present

## 2023-04-15 DIAGNOSIS — R918 Other nonspecific abnormal finding of lung field: Secondary | ICD-10-CM | POA: Diagnosis not present

## 2023-04-15 DIAGNOSIS — I10 Essential (primary) hypertension: Secondary | ICD-10-CM | POA: Diagnosis not present

## 2023-04-23 DIAGNOSIS — J441 Chronic obstructive pulmonary disease with (acute) exacerbation: Secondary | ICD-10-CM | POA: Diagnosis not present

## 2023-04-24 ENCOUNTER — Telehealth: Payer: Self-pay

## 2023-04-24 DIAGNOSIS — I119 Hypertensive heart disease without heart failure: Secondary | ICD-10-CM | POA: Diagnosis not present

## 2023-04-24 DIAGNOSIS — E876 Hypokalemia: Secondary | ICD-10-CM | POA: Diagnosis not present

## 2023-04-24 DIAGNOSIS — J441 Chronic obstructive pulmonary disease with (acute) exacerbation: Secondary | ICD-10-CM | POA: Diagnosis not present

## 2023-04-24 DIAGNOSIS — Z7982 Long term (current) use of aspirin: Secondary | ICD-10-CM | POA: Diagnosis not present

## 2023-04-24 DIAGNOSIS — F17200 Nicotine dependence, unspecified, uncomplicated: Secondary | ICD-10-CM | POA: Diagnosis not present

## 2023-04-24 NOTE — Transitions of Care (Post Inpatient/ED Visit) (Signed)
04/24/2023  Name: [name] MRN: [id number] DOB: May 27, 1946  Today's TOC FU Call Status: Today's TOC FU Call Status:: Successful TOC FU Call Competed TOC FU Call Complete Date: 04/24/23  Transition Care Management Follow-up Telephone Call Discharge Facility: Other (Non[facility] Facility) Name of Other ([facility]) Discharge Facility: [facility] Type of Discharge: Inpatient Admission Primary Inpatient Discharge Diagnosis:: Small Bowel Obstruction How have you been since you were released from the hospital?: Better Any questions or concerns?: No  Items Reviewed: Did you receive and understand the discharge instructions provided?: Yes Medications obtained,verified, and reconciled?: Yes (Medications Reviewed) Any new allergies since your discharge?: No Dietary orders reviewed?: No Do you have support at home?: Yes People in Home: child(ren), adult Name of Support/Comfort Primary Source: Velna Hatchet  Medications Reviewed Today: Medications Reviewed Today     Reviewed by Jodelle Gross, RN (Case Manager) on 04/24/23 at 1145  Med List Status: <None>   Medication Order Taking? Sig Documenting Provider Last Dose Status Informant  acyclovir ointment (ZOVIRAX) 5 % [id number]   Daphine Deutscher, Mary-Margaret, FNP  Active   albuterol (VENTOLIN HFA) 108 (90 Base) MCG/ACT inhaler [id number] Yes INHALE 2 PUFFS EVERY 6 HOURS AS NEEDED Daphine Deutscher, Mary-Margaret, FNP Taking Active   ALPRAZolam Prudy Feeler) 0.5 MG tablet [id number] Yes Take 1 tablet (0.5 mg total) by mouth 2 (two) times daily. Bennie Pierini, FNP Taking Active   Aspirin-Acetaminophen-Caffeine (GOODYS EXTRA STRENGTH) 4635622061 MG PACK [id number]  Take 1 packet by mouth 3 (three) times daily as needed (pain.). [provider]  Active Self  Calcium Carb-Cholecalciferol (CALCIUM+D3 PO) [id number] Yes Take 1 tablet by mouth 3 (three) times a week. [provider] Taking Active Self  CLENPIQ 10-3.5-12 MG-GM -GM/160ML SOLN [id number]  Take 320  mLs by mouth as directed. [provider]  Active Self           Med Note Tiburcio Pea, Luvenia Redden   Tue Jun 22, 2021  2:00 PM) Before colonoscopy  dicyclomine (BENTYL) 10 MG capsule [id number] Yes Take 1 capsule (10 mg total) by mouth 4 (four) times daily as needed for spasms. Daphine Deutscher Mary-Margaret, FNP Taking Active   diphenhydrAMINE (BENADRYL) 25 MG tablet [id number] Yes Take 25 mg by mouth in the morning and at bedtime. [provider] Taking Active Self  doxylamine, Sleep, (UNISOM) 25 MG tablet [id number] Yes Take 25 mg by mouth at bedtime as needed for sleep. [provider] Taking Active Self  fluticasone (FLONASE) 50 MCG/ACT nasal spray [id number] Yes Place 2 sprays into both nostrils daily. Bennie Pierini, FNP Taking Active   Homeopathic Products (LEG CRAMPS) TABS [id number]  Take 1-2 tablets by mouth daily as needed (leg cramps). [provider]  Active Self  HYDROcodone-acetaminophen (NORCO) 10-325 MG tablet [id number]  Take 1 tablet by mouth in the morning, at noon, and at bedtime. Bennie Pierini, FNP  Expired 03/18/23 2359   HYDROcodone-acetaminophen (NORCO) 10-325 MG tablet [id number]  Take 1 tablet by mouth in the morning, at noon, and at bedtime. Bennie Pierini, FNP  Expired 03/18/23 2359   HYDROcodone-acetaminophen (NORCO) 10-325 MG tablet [id number]  Take 1 tablet by mouth in the morning, at noon, and at bedtime. Daphine Deutscher, Mary-Margaret, FNP  Expired 03/18/23 2359   ipratropium (ATROVENT) 0.02 % nebulizer solution [id number] Yes USE 1 VIAL BY NEBULIZATION EVERY 6 (SIX) HOURS AS NEEDED FOR WHEEZING OR SHORTNESS OF BREATH. Daphine Deutscher, Mary-Margaret, FNP Taking Active   pantoprazole (PROTONIX) 40 MG tablet [id number] Yes Take 1 tablet (40 mg total) by mouth 2 (two) times daily.  Daphine Deutscher, Mary-Margaret, FNP Taking Active   PARoxetine (PAXIL) 40 MG tablet [id number] Yes TAKE 1 TABLET BY MOUTH EVERY DAY IN THE MORNING Daphine Deutscher, Mary-Margaret, FNP Taking Active    Phenazopyridine HCl (AZO DINE PO) [id number]  Take 1-2 tablets by mouth 3 (three) times daily as needed (urinary pain/discomfort). [provider]  Active Self  Polyethyl Glycol-Propyl Glycol (LUBRICANT EYE DROPS) 0.4-0.3 % SOLN [id number]  Place 1-2 drops into both eyes 3 (three) times daily as needed (dry/irritated eyes.). [provider]  Active Self  SYMBICORT 80-4.5 MCG/ACT inhaler [id number] Yes Take 2 puffs first thing in am and then another 2 puffs about 12 hours later. Daphine Deutscher Mary-Margaret, FNP Taking Active   valsartan-hydrochlorothiazide (DIOVAN-HCT) 160-12.5 MG tablet [id number] Yes Take 1 tablet by mouth daily. Bennie Pierini, FNP Taking Active             Home Care and Equipment/Supplies: Were Home Health Services Ordered?: No Any new equipment or medical supplies ordered?: No  Functional Questionnaire: Do you need assistance with bathing/showering or dressing?: No Do you need assistance with meal preparation?: No Do you need assistance with eating?: No Do you have difficulty maintaining continence: No Do you need assistance with getting out of bed/getting out of a chair/moving?: No Do you have difficulty managing or taking your medications?: No  Follow up appointments reviewed: PCP Follow-up appointment confirmed?: No (Patient not ready to make appointment, she needs to get settled from her hospital dc) MD Provider Line Number:[phone] Given: No Specialist Hospital Follow-up appointment confirmed?: No Reason Specialist Follow-Up Not Confirmed: Patient has Specialist Provider Number and will Call for Appointment Do you need transportation to your follow-up appointment?: No Do you understand care options if your condition(s) worsen?: Yes-patient verbalized understanding  SDOH Interventions Today    Flowsheet Row Most Recent Value  SDOH Interventions   Food Insecurity Interventions Intervention Not Indicated  Housing Interventions  Intervention Not Indicated  Transportation Interventions Intervention Not Indicated      Jodelle Gross, RN, BSN, CCM Care Management Coordinator [facility] Phone: [phone]/Fax: [phone]

## 2023-04-26 ENCOUNTER — Telehealth: Payer: Self-pay | Admitting: Nurse Practitioner

## 2023-04-26 NOTE — Telephone Encounter (Signed)
Please review

## 2023-04-26 NOTE — Telephone Encounter (Signed)
Needs clarification on home health orders - [facility] came out for PT only and another home health came for wound care but was unable to treat due to patient being established with [facility]. Dressing for wound has not been changed since 7/19. Patient has appointment on 7/26

## 2023-04-26 NOTE — Telephone Encounter (Signed)
Unable to get through the automated service at [facility], pt is w/ their agency, PT saw her on 04/24/23 orders were for PT only, but there was a VO given ok'ing to add nursing and they will be seeing her tomorrow.

## 2023-04-28 ENCOUNTER — Ambulatory Visit (INDEPENDENT_AMBULATORY_CARE_PROVIDER_SITE_OTHER): Payer: 59 | Admitting: Nurse Practitioner

## 2023-04-28 ENCOUNTER — Encounter: Payer: Self-pay | Admitting: Nurse Practitioner

## 2023-04-28 VITALS — BP 120/71 | HR 89 | Temp 96.2°F | Ht 65.0 in | Wt 178.8 lb

## 2023-04-28 DIAGNOSIS — Z09 Encounter for follow-up examination after completed treatment for conditions other than malignant neoplasm: Secondary | ICD-10-CM

## 2023-04-28 DIAGNOSIS — K56609 Unspecified intestinal obstruction, unspecified as to partial versus complete obstruction: Secondary | ICD-10-CM | POA: Diagnosis not present

## 2023-04-28 DIAGNOSIS — Z7689 Persons encountering health services in other specified circumstances: Secondary | ICD-10-CM

## 2023-04-28 NOTE — Patient Instructions (Signed)
Fall Prevention in the Home, Adult Falls can cause injuries and can happen to people of all ages. There are many things you can do to make your home safer and to help prevent falls. What actions can I take to prevent falls? General information Use good lighting in all rooms. Make sure to: Replace any light bulbs that burn out. Turn on the lights in dark areas and use night-lights. Keep items that you use often in easy-to-reach places. Lower the shelves around your home if needed. Move furniture so that there are clear paths around it. Do not use throw rugs or other things on the floor that can make you trip. If any of your floors are uneven, fix them. Add color or contrast paint or tape to clearly mark and help you see: Grab bars or handrails. First and last steps of staircases. Where the edge of each step is. If you use a ladder or stepladder: Make sure that it is fully opened. Do not climb a closed ladder. Make sure the sides of the ladder are locked in place. Have someone hold the ladder while you use it. Know where your pets are as you move through your home. What can I do in the bathroom?     Keep the floor dry. Clean up any water on the floor right away. Remove soap buildup in the bathtub or shower. Buildup makes bathtubs and showers slippery. Use non-skid mats or decals on the floor of the bathtub or shower. Attach bath mats securely with double-sided, non-slip rug tape. If you need to sit down in the shower, use a non-slip stool. Install grab bars by the toilet and in the bathtub and shower. Do not use towel bars as grab bars. What can I do in the bedroom? Make sure that you have a light by your bed that is easy to reach. Do not use any sheets or blankets on your bed that hang to the floor. Have a firm chair or bench with side arms that you can use for support when you get dressed. What can I do in the kitchen? Clean up any spills right away. If you need to reach something  above you, use a step stool with a grab bar. Keep electrical cords out of the way. Do not use floor polish or wax that makes floors slippery. What can I do with my stairs? Do not leave anything on the stairs. Make sure that you have a light switch at the top and the bottom of the stairs. Make sure that there are handrails on both sides of the stairs. Fix handrails that are broken or loose. Install non-slip stair treads on all your stairs if they do not have carpet. Avoid having throw rugs at the top or bottom of the stairs. Choose a carpet that does not hide the edge of the steps on the stairs. Make sure that the carpet is firmly attached to the stairs. Fix carpet that is loose or worn. What can I do on the outside of my home? Use bright outdoor lighting. Fix the edges of walkways and driveways and fix any cracks. Clear paths of anything that can make you trip, such as tools or rocks. Add color or contrast paint or tape to clearly mark and help you see anything that might make you trip as you walk through a door, such as a raised step or threshold. Trim any bushes or trees on paths to your home. Check to see if handrails are loose   or broken and that both sides of all steps have handrails. Install guardrails along the edges of any raised decks and porches. Have leaves, snow, or ice cleared regularly. Use sand, salt, or ice melter on paths if you live where there is ice and snow during the winter. Clean up any spills in your garage right away. This includes grease or oil spills. What other actions can I take? Review your medicines with your doctor. Some medicines can cause dizziness or changes in blood pressure, which increase your risk of falling. Wear shoes that: Have a low heel. Do not wear high heels. Have rubber bottoms and are closed at the toe. Feel good on your feet and fit well. Use tools that help you move around if needed. These include: Canes. Walkers. Scooters. Crutches. Ask  your doctor what else you can do to help prevent falls. This may include seeing a physical therapist to learn to do exercises to move better and get stronger. Where to find more information Centers for Disease Control and Prevention, [employer]: cdc.gov National Institute on Aging: nia.nih.gov National Institute on Aging: nia.nih.gov Contact a doctor if: You are afraid of falling at home. You feel weak, drowsy, or dizzy at home. You fall at home. Get help right away if you: Lose consciousness or have trouble moving after a fall. Have a fall that causes a head injury. These symptoms may be an emergency. Get help right away. Call 911. Do not wait to see if the symptoms will go away. Do not drive yourself to the hospital. This information is not intended to replace advice given to you by your health care provider. Make sure you discuss any questions you have with your health care provider. Document Revised: 05/23/2022 Document Reviewed: 05/23/2022 Elsevier Patient Education  2024 [employer]  

## 2023-04-28 NOTE — Progress Notes (Addendum)
Subjective:    Patient ID: [name], female    DOB: [birth year], 77 y.o.   MRN: [id number]   Chief Complaint:transition of care  Today's visit was for Transitional Care Management.  The patient was discharged from [facility] on 04/24/23 with a primary diagnosis of small bowel obstruction.   Contact with the patient and/or caregiver, by a clinical staff member, was made on 04/24/23 and was documented as a telephone encounter within the EMR.  Through chart review and discussion with the patient I have determined that management of their condition is of moderate complexity.    Patient is doing much better sine coming home. She is eating well, even appetite is poor. Bowel movements are normal. Denies any abdominal pain.       Review of Systems  Constitutional:  Negative for diaphoresis.  Eyes:  Negative for pain.  Respiratory:  Negative for shortness of breath.   Cardiovascular:  Negative for chest pain, palpitations and leg swelling.  Gastrointestinal:  Negative for abdominal pain.  Endocrine: Negative for polydipsia.  Skin:  Negative for rash.  Neurological:  Negative for dizziness, weakness and headaches.  Hematological:  Does not bruise/bleed easily.  All other systems reviewed and are negative.      Objective:   Physical Exam Vitals and nursing note reviewed.  Constitutional:      General: She is not in acute distress.    Appearance: Normal appearance. She is well-developed.  Neck:     Vascular: No carotid bruit or JVD.  Cardiovascular:     Rate and Rhythm: Normal rate and regular rhythm.     Heart sounds: Normal heart sounds.  Pulmonary:     Effort: Pulmonary effort is normal. No respiratory distress.     Breath sounds: Normal breath sounds. No wheezing or rales.  Chest:     Chest wall: No tenderness.  Abdominal:     General: Bowel sounds are normal. There is no distension or abdominal bruit.     Palpations: Abdomen is soft. There is no hepatomegaly,  splenomegaly, mass or pulsatile mass.     Tenderness: There is no abdominal tenderness.  Musculoskeletal:        General: Normal range of motion.     Cervical back: Normal range of motion and neck supple.  Lymphadenopathy:     Cervical: No cervical adenopathy.  Skin:    General: Skin is warm and dry.  Neurological:     Mental Status: She is alert and oriented to person, place, and time.     Deep Tendon Reflexes: Reflexes are normal and symmetric.  Psychiatric:        Behavior: Behavior normal.        Thought Content: Thought content normal.        Judgment: Judgment normal.     BP 120/71   Pulse 89   Temp (!) 96.2 F (35.7 C) (Temporal)   Ht 5\' 5"  (1.651 m)   Wt 178 lb 12.8 oz (81.1 kg)   SpO2 93%   BMI 29.75 kg/m        Assessment & Plan:  [name] in today with chief complaint of Hospitalization Follow-up   1. Encounter for support and coordination of transition of care Hospital records reviewed  2. Small bowel obstruction (HCC) Watch diet Continue wound care  to abdomen. Keep follow up appointment    The above assessment and management plan was discussed with the patient. The patient verbalized understanding of and has agreed  to the management plan. Patient is aware to call the clinic if symptoms persist or worsen. Patient is aware when to return to the clinic for a follow-up visit. Patient educated on when it is appropriate to go to the emergency department.   Mary-Margaret Daphine Deutscher, FNP

## 2023-04-28 NOTE — Addendum Note (Signed)
Addended by: Bennie Pierini on: 04/28/2023 04:01 PM   Modules accepted: Level of Service

## 2023-05-02 ENCOUNTER — Telehealth: Payer: Self-pay | Admitting: Nurse Practitioner

## 2023-05-02 NOTE — Telephone Encounter (Signed)
PCP will not be back in office till 05/08/23, can this be ordered before this

## 2023-05-04 NOTE — Telephone Encounter (Signed)
This will have to wait for PCP or need another F2F visit

## 2023-05-08 NOTE — Telephone Encounter (Signed)
Pt says that she does not have a nurse to come out to home and for nurse not to worry about it.

## 2023-05-08 NOTE — Telephone Encounter (Signed)
[email or web address]

## 2023-05-08 NOTE — Telephone Encounter (Signed)
The hospital was suppose to order. Was it not done. Is wound healing. Has she follow up with surgeon?

## 2023-05-14 ENCOUNTER — Other Ambulatory Visit: Payer: Self-pay | Admitting: Nurse Practitioner

## 2023-05-14 DIAGNOSIS — K219 Gastro-esophageal reflux disease without esophagitis: Secondary | ICD-10-CM

## 2023-05-16 ENCOUNTER — Ambulatory Visit (INDEPENDENT_AMBULATORY_CARE_PROVIDER_SITE_OTHER): Payer: 59

## 2023-05-16 DIAGNOSIS — Z7982 Long term (current) use of aspirin: Secondary | ICD-10-CM

## 2023-05-16 DIAGNOSIS — F419 Anxiety disorder, unspecified: Secondary | ICD-10-CM | POA: Diagnosis not present

## 2023-05-16 DIAGNOSIS — E876 Hypokalemia: Secondary | ICD-10-CM

## 2023-05-16 DIAGNOSIS — I119 Hypertensive heart disease without heart failure: Secondary | ICD-10-CM

## 2023-05-16 DIAGNOSIS — J441 Chronic obstructive pulmonary disease with (acute) exacerbation: Secondary | ICD-10-CM | POA: Diagnosis not present

## 2023-05-16 DIAGNOSIS — F17200 Nicotine dependence, unspecified, uncomplicated: Secondary | ICD-10-CM | POA: Diagnosis not present

## 2023-05-17 ENCOUNTER — Telehealth: Payer: Self-pay | Admitting: Nurse Practitioner

## 2023-05-17 NOTE — Telephone Encounter (Signed)
Pt made aware that Gennette Pac is out of the office today and should return tomorrow. Also made aware of controlled substance protocol. Pt states that she has enough medication for today and tomorrow.

## 2023-05-17 NOTE — Telephone Encounter (Signed)
  Prescription Request  05/17/2023  Is this a "Controlled Substance" medicine?   Have you seen your PCP in the last 2 weeks? Has appt on 8/20 that was made by PCP   If YES, route message to pool  -  If NO, patient needs to be scheduled for appointment.  What is the name of the medication or equipment? HYDROcodone-acetaminophen (NORCO) 10-325 MG tablet   Have you contacted your pharmacy to request a refill? yes   Which pharmacy would you like this sent to?  [id number] - [place], [place] - [address]      Patient notified that their request is being sent to the clinical staff for review and that they should receive a response within 2 business days.

## 2023-05-18 ENCOUNTER — Other Ambulatory Visit: Payer: Self-pay | Admitting: Nurse Practitioner

## 2023-05-18 DIAGNOSIS — M545 Low back pain, unspecified: Secondary | ICD-10-CM

## 2023-05-18 MED ORDER — HYDROCODONE-ACETAMINOPHEN 10-325 MG PO TABS
1.0000 | ORAL_TABLET | Freq: Three times a day (TID) | ORAL | 0 refills | Status: DC
Start: 2023-05-18 — End: 2023-05-23

## 2023-05-18 NOTE — Progress Notes (Signed)
Pain meds refilled. Keep follow  up appointment as scheduled  Meds ordered this encounter  Medications   HYDROcodone-acetaminophen (NORCO) 10-325 MG tablet    Sig: Take 1 tablet by mouth in the morning, at noon, and at bedtime.    Dispense:  90 tablet    Refill:  0    Order Specific Question:   Supervising Provider    Answer:   Nils Pyle [[id number]]   05/23/2023- follow  up appointment  Mary-Margaret Daphine Deutscher, FNP

## 2023-05-23 ENCOUNTER — Encounter: Payer: Self-pay | Admitting: Nurse Practitioner

## 2023-05-23 ENCOUNTER — Ambulatory Visit (INDEPENDENT_AMBULATORY_CARE_PROVIDER_SITE_OTHER): Payer: 59 | Admitting: Nurse Practitioner

## 2023-05-23 VITALS — BP 145/90 | HR 81 | Temp 97.8°F | Resp 20 | Ht 65.0 in | Wt 176.0 lb

## 2023-05-23 DIAGNOSIS — J449 Chronic obstructive pulmonary disease, unspecified: Secondary | ICD-10-CM

## 2023-05-23 DIAGNOSIS — F17210 Nicotine dependence, cigarettes, uncomplicated: Secondary | ICD-10-CM

## 2023-05-23 DIAGNOSIS — E876 Hypokalemia: Secondary | ICD-10-CM | POA: Diagnosis not present

## 2023-05-23 DIAGNOSIS — E785 Hyperlipidemia, unspecified: Secondary | ICD-10-CM | POA: Diagnosis not present

## 2023-05-23 DIAGNOSIS — K580 Irritable bowel syndrome with diarrhea: Secondary | ICD-10-CM | POA: Diagnosis not present

## 2023-05-23 DIAGNOSIS — M545 Low back pain, unspecified: Secondary | ICD-10-CM | POA: Diagnosis not present

## 2023-05-23 DIAGNOSIS — I10 Essential (primary) hypertension: Secondary | ICD-10-CM

## 2023-05-23 DIAGNOSIS — F419 Anxiety disorder, unspecified: Secondary | ICD-10-CM

## 2023-05-23 DIAGNOSIS — G8929 Other chronic pain: Secondary | ICD-10-CM | POA: Diagnosis not present

## 2023-05-23 DIAGNOSIS — Z6832 Body mass index (BMI) 32.0-32.9, adult: Secondary | ICD-10-CM

## 2023-05-23 DIAGNOSIS — F3342 Major depressive disorder, recurrent, in full remission: Secondary | ICD-10-CM

## 2023-05-23 DIAGNOSIS — K219 Gastro-esophageal reflux disease without esophagitis: Secondary | ICD-10-CM | POA: Diagnosis not present

## 2023-05-23 MED ORDER — ALPRAZOLAM 0.5 MG PO TABS
0.5000 mg | ORAL_TABLET | Freq: Two times a day (BID) | ORAL | 2 refills | Status: DC
Start: 2023-05-23 — End: 2023-08-29

## 2023-05-23 MED ORDER — PANTOPRAZOLE SODIUM 40 MG PO TBEC
40.0000 mg | DELAYED_RELEASE_TABLET | Freq: Two times a day (BID) | ORAL | 1 refills | Status: DC
Start: 2023-05-23 — End: 2023-12-11

## 2023-05-23 MED ORDER — DICYCLOMINE HCL 10 MG PO CAPS
10.0000 mg | ORAL_CAPSULE | Freq: Four times a day (QID) | ORAL | 1 refills | Status: DC | PRN
Start: 2023-05-23 — End: 2023-12-11

## 2023-05-23 MED ORDER — HYDROCODONE-ACETAMINOPHEN 10-325 MG PO TABS
1.0000 | ORAL_TABLET | Freq: Three times a day (TID) | ORAL | 0 refills | Status: DC
Start: 2023-06-16 — End: 2023-09-05

## 2023-05-23 MED ORDER — HYDROCODONE-ACETAMINOPHEN 10-325 MG PO TABS
1.0000 | ORAL_TABLET | Freq: Three times a day (TID) | ORAL | 0 refills | Status: DC
Start: 2023-07-16 — End: 2023-09-05

## 2023-05-23 MED ORDER — PAROXETINE HCL 40 MG PO TABS
ORAL_TABLET | ORAL | 1 refills | Status: DC
Start: 2023-05-23 — End: 2023-11-10

## 2023-05-23 MED ORDER — SYMBICORT 80-4.5 MCG/ACT IN AERO
INHALATION_SPRAY | RESPIRATORY_TRACT | 12 refills | Status: DC
Start: 2023-05-23 — End: 2023-12-11

## 2023-05-23 MED ORDER — VALSARTAN-HYDROCHLOROTHIAZIDE 160-12.5 MG PO TABS
1.0000 | ORAL_TABLET | Freq: Every day | ORAL | 1 refills | Status: DC
Start: 2023-05-23 — End: 2023-12-11

## 2023-05-23 MED ORDER — HYDROCODONE-ACETAMINOPHEN 10-325 MG PO TABS
1.0000 | ORAL_TABLET | Freq: Three times a day (TID) | ORAL | 0 refills | Status: DC
Start: 2023-08-15 — End: 2023-09-05

## 2023-05-23 NOTE — Progress Notes (Signed)
Subjective:    Patient ID: [name], female    DOB: 20-Sep-1946, 77 y.o.   MRN: [id number]   Chief Complaint: medical management of chronic issues     HPI:  [name] is a 77 y.o. who identifies as a female who was assigned female at birth.   Social history: Lives with: by herself Work history: retired   Water engineer in today for follow up of the following chronic medical issues:  1. Benign hypertension No c/o chest pain, sob or headache. Doe snot check blood pressure at home BP Readings from Last 3 Encounters:  04/28/23 120/71  02/16/23 123/80  11/03/22 133/79     2. Hyperlipidemia with target LDL less than 100 Does not watch diet and does no dedicated exercise. Refuses statin Lab Results  Component Value Date   CHOL 210 (H) 11/03/2022   HDL 77 11/03/2022   LDLCALC 110 (H) 11/03/2022   TRIG 135 11/03/2022   CHOLHDL 2.7 11/03/2022     3. Gastroesophageal reflux disease, unspecified whether esophagitis present Is on protonix and is doing well.  4. Irritable bowel syndrome with diarrhea Is on bentyl and is doing well. Has occasional diarrhea  5. Anxiety Is on xanax. Worries constantly about her xanax being taken away because she is on pain meds.    05/23/2023    2:07 PM 04/28/2023    3:06 PM 02/16/2023    2:03 PM 11/03/2022    2:08 PM  GAD 7 : Generalized Anxiety Score  Nervous, Anxious, on Edge 2 2 1 1   Control/stop worrying 2 2 0 0  Worry too much - different things 1 2 0 0  Trouble relaxing 1 1 0 0  Restless 1 1 0 0  Easily annoyed or irritable 1 1 0 0  Afraid - awful might happen 1 1 0 0  Total GAD 7 Score 9 10 1 1   Anxiety Difficulty Somewhat difficult Somewhat difficult Not difficult at all Not difficult at all      6. Recurrent major depressive disorder, in full remission (HCC) Is on paxil and is doing well.    05/23/2023    2:06 PM 04/28/2023    3:05 PM 02/16/2023    2:02 PM  Depression screen PHQ 2/9  Decreased Interest 2 1 0   Down, Depressed, Hopeless 1 1 0  PHQ - 2 Score 3 2 0  Altered sleeping 1 2 1   Tired, decreased energy 1 2 1   Change in appetite 1 0 0  Feeling bad or failure about yourself  0 0 0  Trouble concentrating 0 0 0  Moving slowly or fidgety/restless 0 0 0  Suicidal thoughts 0 0 0  PHQ-9 Score 6 6 2   Difficult doing work/chores Somewhat difficult Somewhat difficult Somewhat difficult     7. Hypokalemia Denies muscle cramps Lab Results  Component Value Date   K 4.1 11/03/2022     8. BMI 32.0-32.9,adult No recent weight changes Wt Readings from Last 3 Encounters:  05/23/23 176 lb (79.8 kg)  04/28/23 178 lb 12.8 oz (81.1 kg)  02/16/23 184 lb (83.5 kg)   BMI Readings from Last 3 Encounters:  05/23/23 29.29 kg/m  04/28/23 29.75 kg/m  02/16/23 30.62 kg/m     9. Chronic midline low back pain without sciatica Pain assessment: Cause of pain- not sure Pain location- lower back Pain on scale of 1-10- 7-8/10 Frequency- daily What increases pain-nothing really What makes pain Better-meds help Effects on ADL - none  Any change in general medical condition-none  Current opioids rx- norco 10/325 tid # meds rx- 90 Effectiveness of current meds-helps Adverse reactions from pain meds-none Morphine equivalent-  Pill count performed-No Last drug screen - 02/16/23 ( high risk q48m, moderate risk q30m, low risk yearly ) Urine drug screen today- No Was the NCCSR reviewed- yes  If yes were their any concerning findings? - no   Overdose risk: 1   Pain contract signed on: 02/23/23   10. Cigarette smoker Still smoking over a pack a day   New complaints: None today  Allergies  Allergen Reactions   Meloxicam Other (See Comments)    Stomach ulcers   Outpatient Encounter Medications as of 05/23/2023  Medication Sig   albuterol (VENTOLIN HFA) 108 (90 Base) MCG/ACT inhaler INHALE 2 PUFFS EVERY 6 HOURS AS NEEDED   ALPRAZolam (XANAX) 0.5 MG tablet Take 1 tablet (0.5 mg  total) by mouth 2 (two) times daily.   Aspirin-Acetaminophen-Caffeine (GOODYS EXTRA STRENGTH) 500-325-65 MG PACK Take 1 packet by mouth 3 (three) times daily as needed (pain.).   Calcium Carb-Cholecalciferol (CALCIUM+D3 PO) Take 1 tablet by mouth 3 (three) times a week.   CLENPIQ 10-3.5-12 MG-GM -GM/160ML SOLN Take 320 mLs by mouth as directed.   dicyclomine (BENTYL) 10 MG capsule Take 1 capsule (10 mg total) by mouth 4 (four) times daily as needed for spasms.   diphenhydrAMINE (BENADRYL) 25 MG tablet Take 25 mg by mouth in the morning and at bedtime.   doxylamine, Sleep, (UNISOM) 25 MG tablet Take 25 mg by mouth at bedtime as needed for sleep.   fluticasone (FLONASE) 50 MCG/ACT nasal spray Place 2 sprays into both nostrils daily.   Homeopathic Products (LEG CRAMPS) TABS Take 1-2 tablets by mouth daily as needed (leg cramps).   HYDROcodone-acetaminophen (NORCO) 10-325 MG tablet Take 1 tablet by mouth in the morning, at noon, and at bedtime.   HYDROcodone-acetaminophen (NORCO) 10-325 MG tablet Take 1 tablet by mouth in the morning, at noon, and at bedtime.   HYDROcodone-acetaminophen (NORCO) 10-325 MG tablet Take 1 tablet by mouth in the morning, at noon, and at bedtime.   ipratropium (ATROVENT) 0.02 % nebulizer solution USE 1 VIAL BY NEBULIZATION EVERY 6 (SIX) HOURS AS NEEDED FOR WHEEZING OR SHORTNESS OF BREATH.   pantoprazole (PROTONIX) 40 MG tablet TAKE 1 TABLET BY MOUTH TWICE A DAY   PARoxetine (PAXIL) 40 MG tablet TAKE 1 TABLET BY MOUTH EVERY DAY IN THE MORNING   Phenazopyridine HCl (AZO DINE PO) Take 1-2 tablets by mouth 3 (three) times daily as needed (urinary pain/discomfort).   Polyethyl Glycol-Propyl Glycol (LUBRICANT EYE DROPS) 0.4-0.3 % SOLN Place 1-2 drops into both eyes 3 (three) times daily as needed (dry/irritated eyes.).   SYMBICORT 80-4.5 MCG/ACT inhaler Take 2 puffs first thing in am and then another 2 puffs about 12 hours later.   valsartan-hydrochlorothiazide (DIOVAN-HCT)  160-12.5 MG tablet Take 1 tablet by mouth daily.   Facility-Administered Encounter Medications as of 05/23/2023  Medication   acyclovir ointment (ZOVIRAX) 5 %    Past Surgical History:  Procedure Laterality Date   APPENDECTOMY     77 years old   BACK SURGERY     X 2   BIOPSY  08/11/2016   Procedure: BIOPSY;  Surgeon: Corbin Ade, MD;  [facility];  Service: Endoscopy;;  gastric bx's   BIOPSY  07/01/2021   Procedure: BIOPSY;  Surgeon: Corbin Ade, MD;  [facility];  Service:  Endoscopy;;   BREAST SURGERY     biopsy only   CARPAL TUNNEL RELEASE     right hand   CERVICAL BIOPSY  W/ LOOP ELECTRODE EXCISION  02/2011   CIN-2 with focal ectocervical margin involvement. With CIN-1 ECC negative   CHOLECYSTECTOMY  1984   COLONOSCOPY WITH PROPOFOL N/A 07/01/2021   Procedure: COLONOSCOPY WITH PROPOFOL;  Surgeon: Corbin Ade, MD;  [facility];  Service: Endoscopy;  Laterality: N/A;  1:00pm   ESOPHAGOGASTRODUODENOSCOPY  12/01/2011   Hiatal hernia/Mild fibrotic pyloric stenosis status post dilation with passage of  the scope.  The previously noted gastric ulcer completely healed  The remainder of the gastric mucosa appeared normal   ESOPHAGOGASTRODUODENOSCOPY (EGD) WITH PROPOFOL N/A 08/11/2016   Procedure: ESOPHAGOGASTRODUODENOSCOPY (EGD) WITH PROPOFOL;  Surgeon: Corbin Ade, MD;  [facility];  Service: Endoscopy;  Laterality: N/A;  2:15 PM   ESOPHAGOGASTRODUODENOSCOPY (EGD) WITH PROPOFOL N/A 07/01/2021   Procedure: ESOPHAGOGASTRODUODENOSCOPY (EGD) WITH PROPOFOL;  Surgeon: Corbin Ade, MD;  [facility];  Service: Endoscopy;  Laterality: N/A;   FRACTURE SURGERY  10/18/2016   left knee in 3 places   MALONEY DILATION  07/28/2011   Schatzki's ring s/p 58F with small UES tear as well, small hh, 1cm pyloric channel ulcer, bx benign without H.Pylori. Mobic/goody's at time.   MALONEY DILATION N/A 08/11/2016   Procedure: Elease Hashimoto  DILATION;  Surgeon: Corbin Ade, MD;  [facility];  Service: Endoscopy;  Laterality: N/A;   MALONEY DILATION N/A 07/01/2021   Procedure: Elease Hashimoto DILATION;  Surgeon: Corbin Ade, MD;  [facility];  Service: Endoscopy;  Laterality: N/A;   ORIF PATELLA Left 10/19/2016   Procedure: OPEN REDUCTION INTERNAL (ORIF) FIXATION PATELLA;  Surgeon: Vickki Hearing, MD;  Location[facility];  Service: Orthopedics;  Laterality: Left;   POLYPECTOMY  07/01/2021   Procedure: POLYPECTOMY;  Surgeon: Corbin Ade, MD;  [facility];  Service: Endoscopy;;   SPINE SURGERY  2009   cervical disc   SPINE SURGERY  2009   lumbar   TUBAL LIGATION  1976   tumor removed from arm      Family History  Problem Relation Age of Onset   Heart disease Father        CHF   Heart disease Mother    COPD Sister    Diabetes Sister    Neuropathy Sister    COPD Sister    COPD Sister    Cancer Sister    Stroke Brother    Heart disease Brother    Colon cancer Neg Hx    Anesthesia problems Neg Hx    Hypotension Neg Hx    Malignant hyperthermia Neg Hx    Pseudochol deficiency Neg Hx    Colon polyps Neg Hx       Controlled substance contract: n/a     Review of Systems  Constitutional:  Negative for diaphoresis.  Eyes:  Negative for pain.  Respiratory:  Negative for shortness of breath.   Cardiovascular:  Negative for chest pain, palpitations and leg swelling.  Gastrointestinal:  Negative for abdominal pain.  Endocrine: Negative for polydipsia.  Skin:  Negative for rash.  Neurological:  Negative for dizziness, weakness and headaches.  Hematological:  Does not bruise/bleed easily.  All other systems reviewed and are negative.      Objective:   Physical Exam Vitals and nursing note reviewed.  Constitutional:      General:  She is not in acute distress.    Appearance: Normal appearance. She is well-developed.  HENT:     Head: Normocephalic.     Right Ear: Tympanic  membrane normal.     Left Ear: Tympanic membrane normal.     Nose: Nose normal.     Mouth/Throat:     Mouth: Mucous membranes are moist.  Eyes:     Pupils: Pupils are equal, round, and reactive to light.  Neck:     Vascular: No carotid bruit or JVD.  Cardiovascular:     Rate and Rhythm: Normal rate and regular rhythm.     Heart sounds: Normal heart sounds.  Pulmonary:     Effort: Pulmonary effort is normal. No respiratory distress.     Breath sounds: Wheezing (exp wheezes throughout) present. No rales.  Chest:     Chest wall: No tenderness.  Abdominal:     General: Bowel sounds are normal. There is no distension or abdominal bruit.     Palpations: Abdomen is soft. There is no hepatomegaly, splenomegaly, mass or pulsatile mass.     Tenderness: There is no abdominal tenderness.  Musculoskeletal:        General: Normal range of motion.     Cervical back: Normal range of motion and neck supple.  Lymphadenopathy:     Cervical: No cervical adenopathy.  Skin:    General: Skin is warm and dry.  Neurological:     Mental Status: She is alert and oriented to person, place, and time.     Deep Tendon Reflexes: Reflexes are normal and symmetric.  Psychiatric:        Behavior: Behavior normal.        Thought Content: Thought content normal.        Judgment: Judgment normal.     BP (!) 145/90   Pulse 81   Temp 97.8 F (36.6 C) (Temporal)   Resp 20   Ht 5\' 5"  (1.651 m)   Wt 176 lb (79.8 kg)   SpO2 96%   BMI 29.29 kg/m        Assessment & Plan:   TAHEERAH NARASIMHAN comes in today with chief complaint of Medical Management of Chronic Issues   Diagnosis and orders addressed:  1. Benign hypertension Low sodium diet - valsartan-hydrochlorothiazide (DIOVAN-HCT) 160-12.5 MG tablet; Take 1 tablet by mouth daily.  Dispense: 90 tablet; Refill: 1 - CBC with Differential/Platelet - CMP14+EGFR  2. Hyperlipidemia with target LDL less than 100 Low fat diet - Lipid panel  3.  Gastroesophageal reflux disease, unspecified whether esophagitis present Avoid spicy foods Do not eat 2 hours prior to bedtime - pantoprazole (PROTONIX) 40 MG tablet; Take 1 tablet (40 mg total) by mouth 2 (two) times daily.  Dispense: 180 tablet; Refill: 1  4. Irritable bowel syndrome with diarrhea - dicyclomine (BENTYL) 10 MG capsule; Take 1 capsule (10 mg total) by mouth 4 (four) times daily as needed for spasms.  Dispense: 360 capsule; Refill: 1  5. Anxiety Stress management - ALPRAZolam (XANAX) 0.5 MG tablet; Take 1 tablet (0.5 mg total) by mouth 2 (two) times daily.  Dispense: 60 tablet; Refill: 2  6. Recurrent major depressive disorder, in full remission (HCC) - PARoxetine (PAXIL) 40 MG tablet; TAKE 1 TABLET BY MOUTH EVERY DAY IN THE MORNING  Dispense: 90 tablet; Refill: 1  7. Hypokalemia Labs oending  8. BMI 32.0-32.9,adult Discussed diet and exercise for person with BMI >25 Will recheck weight in 3-6 months   9.  Chronic midline low back pain without sciatica Moist heat rest - HYDROcodone-acetaminophen (NORCO) 10-325 MG tablet; Take 1 tablet by mouth in the morning, at noon, and at bedtime.  Dispense: 90 tablet; Refill: 0 - HYDROcodone-acetaminophen (NORCO) 10-325 MG tablet; Take 1 tablet by mouth in the morning, at noon, and at bedtime.  Dispense: 90 tablet; Refill: 0 - HYDROcodone-acetaminophen (NORCO) 10-325 MG tablet; Take 1 tablet by mouth in the morning, at noon, and at bedtime.  Dispense: 90 tablet; Refill: 0  10. Cigarette smoker Encouraged to stop smoking  11. COPD GOLD ? / active smoker   - SYMBICORT 80-4.5 MCG/ACT inhaler; Take 2 puffs first thing in am and then another 2 puffs about 12 hours later.  Dispense: 1 each; Refill: 12   Labs pending Health Maintenance reviewed Diet and exercise encouraged  Follow up plan: 3 month [place] management   Mary-Margaret Daphine Deutscher, FNP

## 2023-05-23 NOTE — Patient Instructions (Signed)

## 2023-05-24 DIAGNOSIS — J441 Chronic obstructive pulmonary disease with (acute) exacerbation: Secondary | ICD-10-CM | POA: Diagnosis not present

## 2023-05-24 LAB — CMP14+EGFR
ALT: 14 IU/L (ref 0–32)
AST: 22 IU/L (ref 0–40)
Albumin: 3.9 g/dL (ref 3.8–4.8)
Alkaline Phosphatase: 151 IU/L — ABNORMAL HIGH (ref 44–121)
BUN/Creatinine Ratio: 9 — ABNORMAL LOW (ref 12–28)
BUN: 4 mg/dL — ABNORMAL LOW (ref 8–27)
Bilirubin Total: 0.2 mg/dL (ref 0.0–1.2)
CO2: 30 mmol/L — ABNORMAL HIGH (ref 20–29)
Calcium: 9.6 mg/dL (ref 8.7–10.3)
Chloride: 97 mmol/L (ref 96–106)
Creatinine, Ser: 0.44 mg/dL — ABNORMAL LOW (ref 0.57–1.00)
Globulin, Total: 1.9 g/dL (ref 1.5–4.5)
Glucose: 83 mg/dL (ref 70–99)
Potassium: 4 mmol/L (ref 3.5–5.2)
Sodium: 139 mmol/L (ref 134–144)
Total Protein: 5.8 g/dL — ABNORMAL LOW (ref 6.0–8.5)
eGFR: 101 mL/min/{1.73_m2} (ref >59)

## 2023-05-24 LAB — CBC WITH DIFFERENTIAL/PLATELET
Basophils Absolute: 0.1 10*3/uL (ref 0.0–0.2)
Basos: 1 %
EOS (ABSOLUTE): 0.4 10*3/uL (ref 0.0–0.4)
Eos: 5 %
Hematocrit: 42.2 % (ref 34.0–46.6)
Hemoglobin: 13.7 g/dL (ref 11.1–15.9)
Immature Grans (Abs): 0 10*3/uL (ref 0.0–0.1)
Immature Granulocytes: 0 %
Lymphocytes Absolute: 2.2 10*3/uL (ref 0.7–3.1)
Lymphs: 27 %
MCH: 28.4 pg (ref 26.6–33.0)
MCHC: 32.5 g/dL (ref 31.5–35.7)
MCV: 88 fL (ref 79–97)
Monocytes Absolute: 0.8 10*3/uL (ref 0.1–0.9)
Monocytes: 9 %
Neutrophils Absolute: 4.9 10*3/uL (ref 1.4–7.0)
Neutrophils: 58 %
Platelets: 300 10*3/uL (ref 150–450)
RBC: 4.82 x10E6/uL (ref 3.77–5.28)
RDW: 13.3 % (ref 11.7–15.4)
WBC: 8.4 10*3/uL (ref 3.4–10.8)

## 2023-05-24 LAB — LIPID PANEL
Chol/HDL Ratio: 2.6 ratio (ref 0.0–4.4)
Cholesterol, Total: 194 mg/dL (ref 100–199)
HDL: 76 mg/dL (ref >39)
LDL Chol Calc (NIH): 100 mg/dL — ABNORMAL HIGH (ref 0–99)
Triglycerides: 102 mg/dL (ref 0–149)
VLDL Cholesterol Cal: 18 mg/dL (ref 5–40)

## 2023-06-03 ENCOUNTER — Other Ambulatory Visit: Payer: Self-pay | Admitting: Nurse Practitioner

## 2023-06-03 DIAGNOSIS — J449 Chronic obstructive pulmonary disease, unspecified: Secondary | ICD-10-CM

## 2023-06-24 DIAGNOSIS — J441 Chronic obstructive pulmonary disease with (acute) exacerbation: Secondary | ICD-10-CM | POA: Diagnosis not present

## 2023-07-24 DIAGNOSIS — J441 Chronic obstructive pulmonary disease with (acute) exacerbation: Secondary | ICD-10-CM | POA: Diagnosis not present

## 2023-08-24 DIAGNOSIS — J441 Chronic obstructive pulmonary disease with (acute) exacerbation: Secondary | ICD-10-CM | POA: Diagnosis not present

## 2023-08-27 ENCOUNTER — Other Ambulatory Visit: Payer: Self-pay | Admitting: Nurse Practitioner

## 2023-08-27 DIAGNOSIS — F419 Anxiety disorder, unspecified: Secondary | ICD-10-CM

## 2023-08-28 NOTE — Telephone Encounter (Signed)
Copied from CRM [id number]. Topic: Clinical - Medication Refill >> Aug 28, 2023  2:22 PM Almira Coaster wrote: Most Recent Primary Care Visit:  Provider: Bennie Pierini  Department: [facility]  Visit Type: OFFICE VISIT  Date: 05/23/2023  Medication: ALPRAZolam Prudy Feeler) 0.5 MG tablet [[id number]]  Has the patient contacted their pharmacy? Yes (Agent: If no, request that the patient contact the pharmacy for the refill. If patient does not wish to contact the pharmacy document the reason why and proceed with request.) (Agent: If yes, when and what did the pharmacy advise?)  Is this the correct pharmacy for this prescription? Yes If no, delete pharmacy and type the correct one.  This is the patient's preferred pharmacy:  [id number] [place], [place] - [address] [place] [address] Phone: [phone] Fax: [phone]    Has the prescription been filled recently? No  Is the patient out of the medication? Yes  Has the patient been seen for an appointment in the last year OR does the patient have an upcoming appointment? Yes  Can we respond through MyChart? No  Agent: Please be advised that Rx refills may take up to 3 business days. We ask that you follow-up with your pharmacy.

## 2023-08-29 ENCOUNTER — Other Ambulatory Visit: Payer: Self-pay

## 2023-08-29 ENCOUNTER — Other Ambulatory Visit: Payer: Self-pay | Admitting: Nurse Practitioner

## 2023-08-29 DIAGNOSIS — F419 Anxiety disorder, unspecified: Secondary | ICD-10-CM

## 2023-08-29 MED ORDER — ALPRAZOLAM 0.5 MG PO TABS
0.5000 mg | ORAL_TABLET | Freq: Two times a day (BID) | ORAL | 0 refills | Status: DC
Start: 2023-08-29 — End: 2023-09-05

## 2023-08-29 NOTE — Telephone Encounter (Signed)
Copied from CRM [id number]. Topic: Clinical - Medication Refill >> Aug 29, 2023 12:57 PM Clayton Bibles wrote: Most Recent Primary Care Visit:  Provider: Bennie Pierini  Department: [facility]  Visit Type: OFFICE VISIT  Date: 05/23/2023  Medication: Xanax - Called twice  Has the patient contacted their pharmacy? Yes (Agent: If no, request that the patient contact the pharmacy for the refill. If patient does not wish to contact the pharmacy document the reason why and proceed with request.) (Agent: If yes, when and what did the pharmacy advise?)  Is this the correct pharmacy for this prescription? [facility] [address] If no, delete pharmacy and type the correct one.  This is the patient's preferred pharmacy:  [id number] [place], [place] - [address] [place] [address] Phone: [phone] Fax: [phone]  [facility] - [place], [place] - [address] [place] [address] Phone: [phone] Fax: [phone]  [facility] [address] [place] - [address] [place] [address] [place] [address] [place] [address] Phone: [phone] Fax: [phone]  [facility] [place], [place] - [address] [place] [address] Phone: [phone] Fax: [phone]   Has the prescription been filled recently? No  Is the patient out of the medication? Yes  Has the patient been seen for an appointment in the last year OR does the patient have an upcoming appointment? Yes  Can we respond through MyChart? No  Agent: Please be advised that Rx refills may take up to 3 business days. We ask that you follow-up with your pharmacy.

## 2023-09-05 ENCOUNTER — Encounter: Payer: Self-pay | Admitting: Nurse Practitioner

## 2023-09-05 ENCOUNTER — Ambulatory Visit (INDEPENDENT_AMBULATORY_CARE_PROVIDER_SITE_OTHER): Payer: 59 | Admitting: Nurse Practitioner

## 2023-09-05 VITALS — BP 142/78 | HR 81 | Temp 97.6°F | Resp 20 | Ht 60.0 in | Wt 176.0 lb

## 2023-09-05 DIAGNOSIS — M545 Low back pain, unspecified: Secondary | ICD-10-CM

## 2023-09-05 DIAGNOSIS — G8929 Other chronic pain: Secondary | ICD-10-CM

## 2023-09-05 DIAGNOSIS — F419 Anxiety disorder, unspecified: Secondary | ICD-10-CM

## 2023-09-05 MED ORDER — ALPRAZOLAM 0.5 MG PO TABS: 0.5000 mg | ORAL_TABLET | Freq: Two times a day (BID) | ORAL | 4 refills | Status: DC

## 2023-09-05 MED ORDER — HYDROCODONE-ACETAMINOPHEN 10-325 MG PO TABS: 1.0000 | ORAL_TABLET | Freq: Three times a day (TID) | ORAL | 0 refills | Status: DC

## 2023-09-05 NOTE — Progress Notes (Signed)
Subjective:    Patient ID: [name], female    DOB: 04-09-1946, 77 y.o.   MRN: [id number]   Chief Complaint: chronic back pain  HPI  Pain assessment: Cause of pain- DDD Pain location- lower back Pain on scale of 1-10- 8-9/10 Frequency- daily What increases pain-to much activity What makes pain Better-pain meds help Effects on ADL - none Any change in general medical condition-none  Current opioids rx- norco 10/325 TID # meds rx- 90 Effectiveness of current meds-helps Adverse reactions from pain meds-none Morphine equivalent- 30 MME  Pill count performed-No Last drug screen - 02/16/23 ( high risk q29m, moderate risk q62m, low risk yearly ) Urine drug screen today- No Was the [facility] reviewed- yes  If yes were their any concerning findings? - no   Overdose risk: 1    Pain contract signed on:02/23/23   Anxiety- patient worries all the time. Has been on xanax for years- have tried weaning her off of meds with no success.    09/05/2023    2:03 PM 05/23/2023    2:07 PM 04/28/2023    3:06 PM 02/16/2023    2:03 PM  GAD 7 : Generalized Anxiety Score  Nervous, Anxious, on Edge 2 2 2 1   Control/stop worrying 1 2 2  0  Worry too much - different things 1 1 2  0  Trouble relaxing 1 1 1  0  Restless 0 1 1 0  Easily annoyed or irritable 0 1 1 0  Afraid - awful might happen 0 1 1 0  Total GAD 7 Score 5 9 10 1   Anxiety Difficulty Not difficult at all Somewhat difficult Somewhat difficult Not difficult at all      Patient Active Problem List   Diagnosis Date Noted   Prolapsed cervical intervertebral disc 07/28/2020   Radiculopathy, cervical region 06/30/2020   Spondylolisthesis 06/30/2020   DNR (do not resuscitate) 05/24/2020   IBS (irritable bowel syndrome)    Aortic atherosclerosis (HCC) 08/03/2017   Cigarette smoker 07/28/2016   BMI 32.0-32.9,adult 06/23/2015   Depression 02/23/2015   Hyperlipidemia with target LDL less than 100 02/23/2015   COPD GOLD ? /  active smoker  11/26/2012   Anxiety 11/26/2012   Hypokalemia 11/26/2012   Benign hypertension 11/26/2012   Chronic lower back pain 11/26/2012   GERD (gastroesophageal reflux disease) 12/29/2011   Dysphagia 06/30/2011       Review of Systems  Constitutional:  Negative for diaphoresis.  Eyes:  Negative for pain.  Respiratory:  Negative for shortness of breath.   Cardiovascular:  Negative for chest pain, palpitations and leg swelling.  Gastrointestinal:  Negative for abdominal pain.  Endocrine: Negative for polydipsia.  Skin:  Negative for rash.  Neurological:  Negative for dizziness, weakness and headaches.  Hematological:  Does not bruise/bleed easily.  All other systems reviewed and are negative.      Objective:   Physical Exam Vitals and nursing note reviewed.  Constitutional:      General: She is not in acute distress.    Appearance: Normal appearance. She is well-developed.  HENT:     Head: Normocephalic.     Right Ear: Tympanic membrane normal.     Left Ear: Tympanic membrane normal.     Nose: Nose normal.     Mouth/Throat:     Mouth: Mucous membranes are moist.  Eyes:     Pupils: Pupils are equal, round, and reactive to light.  Neck:     Vascular: No carotid bruit or  JVD.  Cardiovascular:     Rate and Rhythm: Normal rate and regular rhythm.     Heart sounds: Normal heart sounds.  Pulmonary:     Effort: Pulmonary effort is normal. No respiratory distress.     Breath sounds: Rhonchi (throughout) present. No wheezing or rales.  Chest:     Chest wall: No tenderness.  Abdominal:     General: There is no abdominal bruit.     Palpations: There is no hepatomegaly, splenomegaly or pulsatile mass.  Musculoskeletal:        General: Normal range of motion.     Cervical back: Normal range of motion and neck supple.  Lymphadenopathy:     Cervical: No cervical adenopathy.  Skin:    General: Skin is warm and dry.  Neurological:     Mental Status: She is alert and  oriented to person, place, and time.     Deep Tendon Reflexes: Reflexes are normal and symmetric.  Psychiatric:        Behavior: Behavior normal.        Thought Content: Thought content normal.        Judgment: Judgment normal.    BP (!) 142/78   Pulse 81   Temp 97.6 F (36.4 C) (Temporal)   Resp 20   Ht 5' (1.524 m)   Wt 176 lb (79.8 kg)   SpO2 90%   BMI 34.37 kg/m          Assessment & Plan:   DAWSYN EHRHARD comes in today with chief complaint of Pain Management   Diagnosis and orders addressed:  1. Chronic midline low back pain without sciatica Moist heat rest - HYDROcodone-acetaminophen (NORCO) 10-325 MG tablet; Take 1 tablet by mouth in the morning, at noon, and at bedtime.  Dispense: 90 tablet; Refill: 0 - HYDROcodone-acetaminophen (NORCO) 10-325 MG tablet; Take 1 tablet by mouth in the morning, at noon, and at bedtime.  Dispense: 90 tablet; Refill: 0 - HYDROcodone-acetaminophen (NORCO) 10-325 MG tablet; Take 1 tablet by mouth in the morning, at noon, and at bedtime.  Dispense: 90 tablet; Refill: 0  2. Anxiety Stress management - ALPRAZolam (XANAX) 0.5 MG tablet; Take 1 tablet (0.5 mg total) by mouth 2 (two) times daily.  Dispense: 60 tablet; Refill: 4   Follow up plan: 3 months   Mary-Margaret Daphine Deutscher, FNP

## 2023-09-23 DIAGNOSIS — J441 Chronic obstructive pulmonary disease with (acute) exacerbation: Secondary | ICD-10-CM | POA: Diagnosis not present

## 2023-09-24 ENCOUNTER — Other Ambulatory Visit: Payer: Self-pay | Admitting: Nurse Practitioner

## 2023-09-24 DIAGNOSIS — F419 Anxiety disorder, unspecified: Secondary | ICD-10-CM

## 2023-10-06 DIAGNOSIS — J441 Chronic obstructive pulmonary disease with (acute) exacerbation: Secondary | ICD-10-CM | POA: Diagnosis not present

## 2023-10-24 DIAGNOSIS — J441 Chronic obstructive pulmonary disease with (acute) exacerbation: Secondary | ICD-10-CM | POA: Diagnosis not present

## 2023-11-08 ENCOUNTER — Telehealth: Payer: Self-pay

## 2023-11-08 NOTE — Telephone Encounter (Signed)
 Copied from CRM [id number]. Topic: General - Other >> Nov 08, 2023  4:00 PM Powell HERO wrote: Reason for CRM: [employer] calling to state they have not received a fax back they need filled out ASAP. They are faxing it again right now in case it was not received.

## 2023-11-09 NOTE — Telephone Encounter (Signed)
Form received and placed on PCP's desk.

## 2023-11-10 ENCOUNTER — Other Ambulatory Visit: Payer: Self-pay | Admitting: Nurse Practitioner

## 2023-11-10 DIAGNOSIS — J449 Chronic obstructive pulmonary disease, unspecified: Secondary | ICD-10-CM

## 2023-11-10 DIAGNOSIS — F3342 Major depressive disorder, recurrent, in full remission: Secondary | ICD-10-CM

## 2023-11-10 NOTE — Telephone Encounter (Signed)
 [employer] form faxed back

## 2023-11-24 DIAGNOSIS — J441 Chronic obstructive pulmonary disease with (acute) exacerbation: Secondary | ICD-10-CM | POA: Diagnosis not present

## 2023-12-10 ENCOUNTER — Other Ambulatory Visit: Payer: Self-pay | Admitting: Nurse Practitioner

## 2023-12-10 DIAGNOSIS — K219 Gastro-esophageal reflux disease without esophagitis: Secondary | ICD-10-CM

## 2023-12-11 ENCOUNTER — Encounter: Payer: Self-pay | Admitting: Nurse Practitioner

## 2023-12-11 ENCOUNTER — Other Ambulatory Visit: Payer: Self-pay | Admitting: Nurse Practitioner

## 2023-12-11 ENCOUNTER — Ambulatory Visit: Payer: 59 | Admitting: Nurse Practitioner

## 2023-12-11 ENCOUNTER — Ambulatory Visit (INDEPENDENT_AMBULATORY_CARE_PROVIDER_SITE_OTHER)

## 2023-12-11 VITALS — BP 163/94 | HR 93 | Temp 97.2°F | Ht 60.0 in | Wt 175.0 lb

## 2023-12-11 DIAGNOSIS — I10 Essential (primary) hypertension: Secondary | ICD-10-CM | POA: Diagnosis not present

## 2023-12-11 DIAGNOSIS — M25551 Pain in right hip: Secondary | ICD-10-CM

## 2023-12-11 DIAGNOSIS — Z0000 Encounter for general adult medical examination without abnormal findings: Secondary | ICD-10-CM

## 2023-12-11 DIAGNOSIS — M5431 Sciatica, right side: Secondary | ICD-10-CM | POA: Diagnosis not present

## 2023-12-11 DIAGNOSIS — F3342 Major depressive disorder, recurrent, in full remission: Secondary | ICD-10-CM

## 2023-12-11 DIAGNOSIS — F17210 Nicotine dependence, cigarettes, uncomplicated: Secondary | ICD-10-CM | POA: Diagnosis not present

## 2023-12-11 DIAGNOSIS — K580 Irritable bowel syndrome with diarrhea: Secondary | ICD-10-CM

## 2023-12-11 DIAGNOSIS — J449 Chronic obstructive pulmonary disease, unspecified: Secondary | ICD-10-CM | POA: Diagnosis not present

## 2023-12-11 DIAGNOSIS — E876 Hypokalemia: Secondary | ICD-10-CM | POA: Diagnosis not present

## 2023-12-11 DIAGNOSIS — Z0001 Encounter for general adult medical examination with abnormal findings: Secondary | ICD-10-CM | POA: Diagnosis not present

## 2023-12-11 DIAGNOSIS — Z6832 Body mass index (BMI) 32.0-32.9, adult: Secondary | ICD-10-CM

## 2023-12-11 DIAGNOSIS — E785 Hyperlipidemia, unspecified: Secondary | ICD-10-CM | POA: Diagnosis not present

## 2023-12-11 DIAGNOSIS — F419 Anxiety disorder, unspecified: Secondary | ICD-10-CM

## 2023-12-11 DIAGNOSIS — K219 Gastro-esophageal reflux disease without esophagitis: Secondary | ICD-10-CM

## 2023-12-11 DIAGNOSIS — G8929 Other chronic pain: Secondary | ICD-10-CM

## 2023-12-11 MED ORDER — HYDROCODONE-ACETAMINOPHEN 10-325 MG PO TABS
1.0000 | ORAL_TABLET | Freq: Three times a day (TID) | ORAL | 0 refills | Status: DC
Start: 2024-02-09 — End: 2024-01-01

## 2023-12-11 MED ORDER — ALPRAZOLAM 0.5 MG PO TABS
0.5000 mg | ORAL_TABLET | Freq: Two times a day (BID) | ORAL | 4 refills | Status: DC
Start: 2023-12-11 — End: 2024-01-01

## 2023-12-11 MED ORDER — PANTOPRAZOLE SODIUM 40 MG PO TBEC: 40.0000 mg | DELAYED_RELEASE_TABLET | Freq: Two times a day (BID) | ORAL | 1 refills | Status: DC

## 2023-12-11 MED ORDER — PREDNISONE 10 MG (21) PO TBPK: ORAL_TABLET | ORAL | 0 refills | Status: DC

## 2023-12-11 MED ORDER — HYDROCODONE-ACETAMINOPHEN 10-325 MG PO TABS
1.0000 | ORAL_TABLET | Freq: Three times a day (TID) | ORAL | 0 refills | Status: DC
Start: 2024-01-10 — End: 2024-01-01

## 2023-12-11 MED ORDER — VALSARTAN-HYDROCHLOROTHIAZIDE 160-12.5 MG PO TABS: 1.0000 | ORAL_TABLET | Freq: Every day | ORAL | 1 refills | Status: DC

## 2023-12-11 MED ORDER — HYDROCODONE-ACETAMINOPHEN 10-325 MG PO TABS: 1.0000 | ORAL_TABLET | Freq: Three times a day (TID) | ORAL | 0 refills | Status: DC

## 2023-12-11 MED ORDER — SYMBICORT 80-4.5 MCG/ACT IN AERO: INHALATION_SPRAY | RESPIRATORY_TRACT | 12 refills | Status: DC

## 2023-12-11 MED ORDER — PAROXETINE HCL 40 MG PO TABS: ORAL_TABLET | ORAL | 1 refills | Status: DC

## 2023-12-11 MED ORDER — DICYCLOMINE HCL 10 MG PO CAPS: 10.0000 mg | ORAL_CAPSULE | Freq: Four times a day (QID) | ORAL | 1 refills | Status: AC | PRN

## 2023-12-11 NOTE — Patient Instructions (Signed)
 Sciatica  Sciatica is pain, weakness, tingling, or loss of feeling (numbness) along the sciatic nerve. The sciatic nerve starts in the lower back and goes down the back of each leg. Sciatica usually affects one side of the body. Sciatica usually goes away on its own or with treatment. Sometimes, sciatica may come back. What are the causes? This condition happens when the sciatic nerve is pinched or has pressure put on it. This may be caused by: A disk in between the bones of the spine bulging out too far (herniated disk). Changes in the spinal disks due to aging. A condition that affects a muscle in the butt. Extra bone growth near the sciatic nerve. A break (fracture) of the area between your hip bones (pelvis). Pregnancy. Tumor. This is rare. What increases the risk? You are more likely to develop this condition if you: Play sports that put pressure or stress on the spine. Have poor strength and ease of movement (flexibility). Have had a back injury or back surgery. Sit for long periods of time. Do activities that involve bending or lifting over and over again. Are very overweight (obese). What are the signs or symptoms? Symptoms can vary from mild to very bad. They may include: Any of these problems in the lower back, leg, hip, or butt: Mild tingling, loss of feeling, or dull aches. A burning feeling. Sharp pains. Loss of feeling in the back of the calf or the sole of the foot. Leg weakness. Very bad back pain that makes it hard to move. These symptoms may get worse when you cough, sneeze, or laugh. They may also get worse when you sit or stand for long periods of time. How is this treated? This condition often gets better without any treatment. However, treatment may include: Changing or cutting back on physical activity when you have pain. Exercising, including strengthening and stretching. Putting ice or heat on the affected area. Shots of medicines to relieve pain and  swelling or to relax your muscles. Surgery. Follow these instructions at home: Medicines Take over-the-counter and prescription medicines only as told by your doctor. Ask your doctor if you should avoid driving or using machines while you are taking your medicine. Managing pain     If told, put ice on the affected area. To do this: Put ice in a plastic bag. Place a towel between your skin and the bag. Leave the ice on for 20 minutes, 2-3 times a day. If your skin turns bright red, take off the ice right away to prevent skin damage. The risk of skin damage is higher if you cannot feel pain, heat, or cold. If told, put heat on the affected area. Do this as often as told by your doctor. Use the heat source that your doctor tells you to use, such as a moist heat pack or a heating pad. Place a towel between your skin and the heat source. Leave the heat on for 20-30 minutes. If your skin turns bright red, take off the heat right away to prevent burns. The risk of burns is higher if you cannot feel pain, heat, or cold. Activity  Return to your normal activities when your doctor says that it is safe. Avoid activities that make your symptoms worse. Take short rests during the day. When you rest for a long time, do some physical activity or stretching between periods of rest. Avoid sitting for a long time without moving. Get up and move around at least one time each  hour. Do exercises and stretches as told by your doctor. Do not lift anything that is heavier than 10 lb (4.5 kg). Avoid lifting heavy things even when you do not have symptoms. Avoid lifting heavy things over and over. When you lift objects, always lift in a way that is safe for your body. To do this, you should: Bend your knees. Keep the object close to your body. Avoid twisting. General instructions Stay at a healthy weight. Wear comfortable shoes that support your feet. Avoid wearing high heels. Avoid sleeping on a mattress  that is too soft or too hard. You might have less pain if you sleep on a mattress that is firm enough to support your back. Contact a doctor if: Your pain is not controlled by medicine. Your pain does not get better. Your pain gets worse. Your pain lasts longer than 4 weeks. You lose weight without trying. Get help right away if: You cannot control when you pee (urinate) or poop (have a bowel movement). You have weakness in any of these areas and it gets worse: Lower back. The area between your hip bones. Butt. Legs. You have redness or swelling of your back. You have a burning feeling when you pee. Summary Sciatica is pain, weakness, tingling, or loss of feeling (numbness) along the sciatic nerve. This may include the lower back, legs, hips, and butt. This condition happens when the sciatic nerve is pinched or has pressure put on it. Treatment often includes rest, exercise, medicines, and putting ice or heat on the affected area. This information is not intended to replace advice given to you by your health care provider. Make sure you discuss any questions you have with your health care provider. Document Revised: 12/27/2021 Document Reviewed: 12/27/2021 Elsevier Patient Education  2024 [employer].

## 2023-12-11 NOTE — Progress Notes (Signed)
 Subjective:    Patient ID: [name], female    DOB: 01-05-1946, 78 y.o.   MRN: [id number]   Chief Complaint: medical management of chronic issues     HPI:  [name] is a 78 y.o. who identifies as a female who was assigned female at birth.   Social history: Lives with: by herself Work history: retired   Water engineer in today for follow up of the following chronic medical issues:  1. Benign hypertension No c/o chest pain, sob or headache. Doe snot check blood pressure at home BP Readings from Last 3 Encounters:  09/05/23 (!) 142/78  05/23/23 (!) 145/90  04/28/23 120/71     2. Hyperlipidemia with target LDL less than 100 Does not watch diet and does no dedicated exercise. Refuses statin Lab Results  Component Value Date   CHOL 194 05/23/2023   HDL 76 05/23/2023   LDLCALC 100 (H) 05/23/2023   TRIG 102 05/23/2023   CHOLHDL 2.6 05/23/2023     3. Gastroesophageal reflux disease, unspecified whether esophagitis present Is on protonix and is doing well.  4. Irritable bowel syndrome with diarrhea Is on bentyl and is doing well. Has occasional diarrhea  5. Anxiety Is on xanax. Worries constantly about her xanax being taken away because she is on pain meds.    12/11/2023    2:31 PM 09/05/2023    2:03 PM 05/23/2023    2:07 PM 04/28/2023    3:06 PM  GAD 7 : Generalized Anxiety Score  Nervous, Anxious, on Edge 1 2 2 2   Control/stop worrying 1 1 2 2   Worry too much - different things 1 1 1 2   Trouble relaxing 1 1 1 1   Restless 1 0 1 1  Easily annoyed or irritable 1 0 1 1  Afraid - awful might happen 1 0 1 1  Total GAD 7 Score 7 5 9 10   Anxiety Difficulty Somewhat difficult Not difficult at all Somewhat difficult Somewhat difficult     6. Recurrent major depressive disorder, in full remission (HCC) Is on paxil and is doing well.    12/11/2023    2:31 PM 09/05/2023    2:02 PM 05/23/2023    2:06 PM  Depression screen PHQ 2/9  Decreased Interest 1 0 2   Down, Depressed, Hopeless 1 1 1   PHQ - 2 Score 2 1 3   Altered sleeping 1 2 1   Tired, decreased energy 1 2 1   Change in appetite 1 0 1  Feeling bad or failure about yourself  0 0 0  Trouble concentrating 1 0 0  Moving slowly or fidgety/restless 1 0 0  Suicidal thoughts 0 0 0  PHQ-9 Score 7 5 6   Difficult doing work/chores Somewhat difficult Somewhat difficult Somewhat difficult       7. Hypokalemia Denies muscle cramps Lab Results  Component Value Date   K 4.0 05/23/2023     8. BMI 32.0-32.9,adult No recent weight changes  Wt Readings from Last 3 Encounters:  12/11/23 175 lb (79.4 kg)  09/05/23 176 lb (79.8 kg)  05/23/23 176 lb (79.8 kg)   BMI Readings from Last 3 Encounters:  12/11/23 34.18 kg/m  09/05/23 34.37 kg/m  05/23/23 29.29 kg/m      9. Chronic midline low back pain without sciatica Pain assessment: Cause of pain- not sure Pain location- lower back Pain on scale of 1-10- 7-8/10 Frequency- daily What increases pain-nothing really What makes pain Better-meds help Effects on ADL - none  Any change in general medical condition-none  Current opioids rx- norco 10/325 tid # meds rx- 90 Effectiveness of current meds-helps Adverse reactions from pain meds-none Morphine equivalent-  Pill count performed-No Last drug screen - 02/16/23 ( high risk q90m, moderate risk q12m, low risk yearly ) Urine drug screen today- No Was the [facility] reviewed- yes  If yes were their any concerning findings? - no   Overdose risk: 1   Pain contract signed on: 02/23/23   10. Cigarette smoker Still smoking over a pack a day   New complaints: Right hip pain- started over a mont ago. Pain worsening when getting up and down. Pain radiates down right leg. Rates pain 8/10 despite taking pain pills.  Allergies  Allergen Reactions   Meloxicam Other (See Comments)    Stomach ulcers   Outpatient Encounter Medications as of 12/11/2023  Medication Sig   albuterol  (VENTOLIN HFA) 108 (90 Base) MCG/ACT inhaler INHALE 2 PUFFS INTO THE LUNGS EVERY 6 HOURS AS NEEDED.   ALPRAZolam (XANAX) 0.5 MG tablet Take 1 tablet (0.5 mg total) by mouth 2 (two) times daily.   Aspirin-Acetaminophen-Caffeine (GOODYS EXTRA STRENGTH) 500-325-65 MG PACK Take 1 packet by mouth 3 (three) times daily as needed (pain.).   Calcium Carb-Cholecalciferol (CALCIUM+D3 PO) Take 1 tablet by mouth 3 (three) times a week.   dicyclomine (BENTYL) 10 MG capsule Take 1 capsule (10 mg total) by mouth 4 (four) times daily as needed for spasms.   diphenhydrAMINE (BENADRYL) 25 MG tablet Take 25 mg by mouth in the morning and at bedtime.   doxylamine, Sleep, (UNISOM) 25 MG tablet Take 25 mg by mouth at bedtime as needed for sleep.   fluticasone (FLONASE) 50 MCG/ACT nasal spray SPRAY 2 SPRAYS INTO EACH NOSTRIL EVERY DAY   Homeopathic Products (LEG CRAMPS) TABS Take 1-2 tablets by mouth daily as needed (leg cramps).   HYDROcodone-acetaminophen (NORCO) 10-325 MG tablet Take 1 tablet by mouth in the morning, at noon, and at bedtime.   HYDROcodone-acetaminophen (NORCO) 10-325 MG tablet Take 1 tablet by mouth in the morning, at noon, and at bedtime.   HYDROcodone-acetaminophen (NORCO) 10-325 MG tablet Take 1 tablet by mouth in the morning, at noon, and at bedtime.   ipratropium (ATROVENT) 0.02 % nebulizer solution USE 1 VIAL BY NEBULIZATION EVERY 6 (SIX) HOURS AS NEEDED FOR WHEEZING OR SHORTNESS OF BREATH.   pantoprazole (PROTONIX) 40 MG tablet TAKE 1 TABLET BY MOUTH TWICE A DAY   PARoxetine (PAXIL) 40 MG tablet TAKE 1 TABLET BY MOUTH EVERY DAY IN THE MORNING   Phenazopyridine HCl (AZO DINE PO) Take 1-2 tablets by mouth 3 (three) times daily as needed (urinary pain/discomfort).   Polyethyl Glycol-Propyl Glycol (LUBRICANT EYE DROPS) 0.4-0.3 % SOLN Place 1-2 drops into both eyes 3 (three) times daily as needed (dry/irritated eyes.).   SYMBICORT 80-4.5 MCG/ACT inhaler Take 2 puffs first thing in am and then another  2 puffs about 12 hours later.   valsartan-hydrochlorothiazide (DIOVAN-HCT) 160-12.5 MG tablet Take 1 tablet by mouth daily.   [DISCONTINUED] pantoprazole (PROTONIX) 40 MG tablet Take 1 tablet (40 mg total) by mouth 2 (two) times daily.   Facility-Administered Encounter Medications as of 12/11/2023  Medication   acyclovir ointment (ZOVIRAX) 5 %    Past Surgical History:  Procedure Laterality Date   APPENDECTOMY     78 years old   BACK SURGERY     X 2   BIOPSY  08/11/2016   Procedure: BIOPSY;  Surgeon: Gerrit Friends  Rourk, MD;  [facility];  Service: Endoscopy;;  gastric bx's   BIOPSY  07/01/2021   Procedure: BIOPSY;  Surgeon: Corbin Ade, MD;  Location: [facility];  Service: Endoscopy;;   BREAST SURGERY     biopsy only   CARPAL TUNNEL RELEASE     right hand   CERVICAL BIOPSY  W/ LOOP ELECTRODE EXCISION  02/2011   CIN-2 with focal ectocervical margin involvement. With CIN-1 ECC negative   CHOLECYSTECTOMY  1984   COLONOSCOPY WITH PROPOFOL N/A 07/01/2021   Procedure: COLONOSCOPY WITH PROPOFOL;  Surgeon: Corbin Ade, MD;  Location: [facility];  Service: Endoscopy;  Laterality: N/A;  1:00pm   ESOPHAGOGASTRODUODENOSCOPY  12/01/2011   Hiatal hernia/Mild fibrotic pyloric stenosis status post dilation with passage of  the scope.  The previously noted gastric ulcer completely healed  The remainder of the gastric mucosa appeared normal   ESOPHAGOGASTRODUODENOSCOPY (EGD) WITH PROPOFOL N/A 08/11/2016   Procedure: ESOPHAGOGASTRODUODENOSCOPY (EGD) WITH PROPOFOL;  Surgeon: Corbin Ade, MD;  [facility]: [facility];  Service: Endoscopy;  Laterality: N/A;  2:15 PM   ESOPHAGOGASTRODUODENOSCOPY (EGD) WITH PROPOFOL N/A 07/01/2021   Procedure: ESOPHAGOGASTRODUODENOSCOPY (EGD) WITH PROPOFOL;  Surgeon: Corbin Ade, MD;  [facility]: [facility];  Service: Endoscopy;  Laterality: N/A;   FRACTURE SURGERY  10/18/2016   left knee in 3 places   MALONEY DILATION  07/28/2011   Schatzki's  ring s/p 30F with small UES tear as well, small hh, 1cm pyloric channel ulcer, bx benign without H.Pylori. Mobic/goody's at time.   MALONEY DILATION N/A 08/11/2016   Procedure: Elease Hashimoto DILATION;  Surgeon: Corbin Ade, MD;  [facility];  Service: Endoscopy;  Laterality: N/A;   MALONEY DILATION N/A 07/01/2021   Procedure: Elease Hashimoto DILATION;  Surgeon: Corbin Ade, MD;  [facility];  Service: Endoscopy;  Laterality: N/A;   ORIF PATELLA Left 10/19/2016   Procedure: OPEN REDUCTION INTERNAL (ORIF) FIXATION PATELLA;  Surgeon: Vickki Hearing, MD;  Location: [facility];  Service: Orthopedics;  Laterality: Left;   POLYPECTOMY  07/01/2021   Procedure: POLYPECTOMY;  Surgeon: Corbin Ade, MD;  [facility];  Service: Endoscopy;;   SPINE SURGERY  2009   cervical disc   SPINE SURGERY  2009   lumbar   TUBAL LIGATION  1976   tumor removed from arm      Family History  Problem Relation Age of Onset   Heart disease Father        CHF   Heart disease Mother    COPD Sister    Diabetes Sister    Neuropathy Sister    COPD Sister    COPD Sister    Cancer Sister    Stroke Brother    Heart disease Brother    Colon cancer Neg Hx    Anesthesia problems Neg Hx    Hypotension Neg Hx    Malignant hyperthermia Neg Hx    Pseudochol deficiency Neg Hx    Colon polyps Neg Hx       Controlled substance contract: n/a     Review of Systems  Constitutional:  Negative for diaphoresis.  Eyes:  Negative for pain.  Respiratory:  Negative for shortness of breath.   Cardiovascular:  Negative for chest pain, palpitations and leg swelling.  Gastrointestinal:  Negative for abdominal pain.  Endocrine: Negative for polydipsia.  Skin:  Negative for rash.  Neurological:  Negative for dizziness, weakness and headaches.  Hematological:  Does not bruise/bleed easily.  All other systems reviewed and are negative.      Objective:   Physical Exam Vitals and nursing note  reviewed.  Constitutional:      General: She is not in acute distress.    Appearance: Normal appearance. She is well-developed.  HENT:     Head: Normocephalic.     Right Ear: Tympanic membrane normal.     Left Ear: Tympanic membrane normal.     Nose: Nose normal.     Mouth/Throat:     Mouth: Mucous membranes are moist.  Eyes:     Pupils: Pupils are equal, round, and reactive to light.  Neck:     Vascular: No carotid bruit or JVD.  Cardiovascular:     Rate and Rhythm: Normal rate and regular rhythm.     Heart sounds: Normal heart sounds.  Pulmonary:     Effort: Pulmonary effort is normal. No respiratory distress.     Breath sounds: Wheezing (exp wheezes throughout) present. No rales.  Chest:     Chest wall: No tenderness.  Abdominal:     General: Bowel sounds are normal. There is no distension or abdominal bruit.     Palpations: Abdomen is soft. There is no hepatomegaly, splenomegaly, mass or pulsatile mass.     Tenderness: There is no abdominal tenderness.  Musculoskeletal:        General: Normal range of motion.     Cervical back: Normal range of motion and neck supple.  Lymphadenopathy:     Cervical: No cervical adenopathy.  Skin:    General: Skin is warm and dry.  Neurological:     Mental Status: She is alert and oriented to person, place, and time.     Deep Tendon Reflexes: Reflexes are normal and symmetric.  Psychiatric:        Behavior: Behavior normal.        Thought Content: Thought content normal.        Judgment: Judgment normal.     BP (!) 163/94   Pulse 93   Temp (!) 97.2 F (36.2 C) (Temporal)   Ht 5' (1.524 m)   Wt 175 lb (79.4 kg)   SpO2 94%   BMI 34.18 kg/m   Oxygen results at rest on room air 87% Oxygen while walking on room air 86% Oxygen while walking on 2L of oxygen 92%  EKG- sinus tachycardia-Preliminary reading by Paulene Floor, FNP  [facility]  Chest xray- report pending   Right hip xray- osteoarthritis-Preliminary reading by Paulene Floor,  FNP  [facility]       Assessment & Plan:   [name] comes in today with chief complaint of annual physical  Diagnosis and orders addressed:  1. Annual physical  2. Benign hypertension Low sodium diet - valsartan-hydrochlorothiazide (DIOVAN-HCT) 160-12.5 MG tablet; Take 1 tablet by mouth daily.  Dispense: 90 tablet; Refill: 1 - CBC with Differential/Platelet - CMP14+EGFR  3. Hyperlipidemia with target LDL less than 100 Low fat diet - Lipid panel  4. Gastroesophageal reflux disease, unspecified whether esophagitis present Avoid spicy foods Do not eat 2 hours prior to bedtime - pantoprazole (PROTONIX) 40 MG tablet; Take 1 tablet (40 mg total) by mouth 2 (two) times daily.  Dispense: 180 tablet; Refill: 1  5. Irritable bowel syndrome with diarrhea - dicyclomine (BENTYL) 10 MG capsule; Take 1 capsule (10 mg total) by mouth 4 (four) times daily as needed for spasms.  Dispense: 360 capsule; Refill: 1  6. Anxiety Stress  management - ALPRAZolam (XANAX) 0.5 MG tablet; Take 1 tablet (0.5 mg total) by mouth 2 (two) times daily.  Dispense: 60 tablet; Refill: 2  7. Recurrent major depressive disorder, in full remission (HCC) - PARoxetine (PAXIL) 40 MG tablet; TAKE 1 TABLET BY MOUTH EVERY DAY IN THE MORNING  Dispense: 90 tablet; Refill: 1  8. Hypokalemia Labs oending  9. BMI 32.0-32.9,adult Discussed diet and exercise for person with BMI >25 Will recheck weight in 3-6 months   10. Chronic midline low back pain without sciatica Moist heat rest - HYDROcodone-acetaminophen (NORCO) 10-325 MG tablet; Take 1 tablet by mouth in the morning, at noon, and at bedtime.  Dispense: 90 tablet; Refill: 0 - HYDROcodone-acetaminophen (NORCO) 10-325 MG tablet; Take 1 tablet by mouth in the morning, at noon, and at bedtime.  Dispense: 90 tablet; Refill: 0 - HYDROcodone-acetaminophen (NORCO) 10-325 MG tablet; Take 1 tablet by mouth in the morning, at noon, and at bedtime.  Dispense: 90 tablet;  Refill: 0  11. Cigarette smoker Encouraged to stop smoking  12. COPD GOLD ? / active smoker  Ordered portable oxygen - SYMBICORT 80-4.5 MCG/ACT inhaler; Take 2 puffs first thing in am and then another 2 puffs about 12 hours later.  Dispense: 1 each; Refill: 12  13. sciatica Moist heat Prednisone 10mg  6 day dose pack   Labs pending Health Maintenance reviewed Diet and exercise encouraged  Follow up plan: 3 months pain management   Mary-Margaret Daphine Deutscher, FNP

## 2023-12-11 NOTE — Addendum Note (Signed)
 Addended by: Cleda Daub on: 12/11/2023 04:34 PM   Modules accepted: Orders

## 2023-12-12 NOTE — Addendum Note (Signed)
 Addended by: Julious Payer D on: 12/12/2023 10:29 AM   Modules accepted: Orders

## 2023-12-17 DIAGNOSIS — R7989 Other specified abnormal findings of blood chemistry: Secondary | ICD-10-CM | POA: Diagnosis not present

## 2023-12-17 DIAGNOSIS — M1611 Unilateral primary osteoarthritis, right hip: Secondary | ICD-10-CM | POA: Diagnosis not present

## 2023-12-17 DIAGNOSIS — Z20822 Contact with and (suspected) exposure to covid-19: Secondary | ICD-10-CM | POA: Diagnosis not present

## 2023-12-17 DIAGNOSIS — T50904A Poisoning by unspecified drugs, medicaments and biological substances, undetermined, initial encounter: Secondary | ICD-10-CM | POA: Diagnosis not present

## 2023-12-17 DIAGNOSIS — R4781 Slurred speech: Secondary | ICD-10-CM | POA: Diagnosis not present

## 2023-12-17 DIAGNOSIS — Z792 Long term (current) use of antibiotics: Secondary | ICD-10-CM | POA: Diagnosis not present

## 2023-12-17 DIAGNOSIS — K7682 Hepatic encephalopathy: Secondary | ICD-10-CM | POA: Diagnosis not present

## 2023-12-17 DIAGNOSIS — R6889 Other general symptoms and signs: Secondary | ICD-10-CM | POA: Diagnosis not present

## 2023-12-17 DIAGNOSIS — Z66 Do not resuscitate: Secondary | ICD-10-CM | POA: Diagnosis not present

## 2023-12-17 DIAGNOSIS — M6282 Rhabdomyolysis: Secondary | ICD-10-CM | POA: Diagnosis not present

## 2023-12-17 DIAGNOSIS — G8929 Other chronic pain: Secondary | ICD-10-CM | POA: Diagnosis not present

## 2023-12-17 DIAGNOSIS — N3000 Acute cystitis without hematuria: Secondary | ICD-10-CM | POA: Diagnosis not present

## 2023-12-17 DIAGNOSIS — Z9151 Personal history of suicidal behavior: Secondary | ICD-10-CM | POA: Diagnosis not present

## 2023-12-17 DIAGNOSIS — Z79899 Other long term (current) drug therapy: Secondary | ICD-10-CM | POA: Diagnosis not present

## 2023-12-17 DIAGNOSIS — E876 Hypokalemia: Secondary | ICD-10-CM | POA: Diagnosis not present

## 2023-12-17 DIAGNOSIS — Z1152 Encounter for screening for COVID-19: Secondary | ICD-10-CM | POA: Diagnosis not present

## 2023-12-17 DIAGNOSIS — K219 Gastro-esophageal reflux disease without esophagitis: Secondary | ICD-10-CM | POA: Diagnosis not present

## 2023-12-17 DIAGNOSIS — N390 Urinary tract infection, site not specified: Secondary | ICD-10-CM | POA: Diagnosis not present

## 2023-12-17 DIAGNOSIS — T887XXA Unspecified adverse effect of drug or medicament, initial encounter: Secondary | ICD-10-CM | POA: Diagnosis not present

## 2023-12-17 DIAGNOSIS — J449 Chronic obstructive pulmonary disease, unspecified: Secondary | ICD-10-CM | POA: Diagnosis not present

## 2023-12-17 DIAGNOSIS — S1989XA Other specified injuries of other specified part of neck, initial encounter: Secondary | ICD-10-CM | POA: Diagnosis not present

## 2023-12-17 DIAGNOSIS — M47812 Spondylosis without myelopathy or radiculopathy, cervical region: Secondary | ICD-10-CM | POA: Diagnosis not present

## 2023-12-17 DIAGNOSIS — G9341 Metabolic encephalopathy: Secondary | ICD-10-CM | POA: Diagnosis not present

## 2023-12-17 DIAGNOSIS — T796XXA Traumatic ischemia of muscle, initial encounter: Secondary | ICD-10-CM | POA: Diagnosis not present

## 2023-12-17 DIAGNOSIS — S0990XA Unspecified injury of head, initial encounter: Secondary | ICD-10-CM | POA: Diagnosis not present

## 2023-12-17 DIAGNOSIS — M25551 Pain in right hip: Secondary | ICD-10-CM | POA: Diagnosis not present

## 2023-12-17 DIAGNOSIS — W19XXXA Unspecified fall, initial encounter: Secondary | ICD-10-CM | POA: Diagnosis not present

## 2023-12-17 DIAGNOSIS — E039 Hypothyroidism, unspecified: Secondary | ICD-10-CM | POA: Diagnosis not present

## 2023-12-17 DIAGNOSIS — R0902 Hypoxemia: Secondary | ICD-10-CM | POA: Diagnosis not present

## 2023-12-17 DIAGNOSIS — I10 Essential (primary) hypertension: Secondary | ICD-10-CM | POA: Diagnosis not present

## 2023-12-17 DIAGNOSIS — Z981 Arthrodesis status: Secondary | ICD-10-CM | POA: Diagnosis not present

## 2023-12-17 DIAGNOSIS — Z743 Need for continuous supervision: Secondary | ICD-10-CM | POA: Diagnosis not present

## 2023-12-17 DIAGNOSIS — F17210 Nicotine dependence, cigarettes, uncomplicated: Secondary | ICD-10-CM | POA: Diagnosis not present

## 2023-12-20 DIAGNOSIS — R0602 Shortness of breath: Secondary | ICD-10-CM | POA: Diagnosis not present

## 2023-12-20 DIAGNOSIS — R059 Cough, unspecified: Secondary | ICD-10-CM | POA: Diagnosis not present

## 2023-12-20 DIAGNOSIS — J449 Chronic obstructive pulmonary disease, unspecified: Secondary | ICD-10-CM | POA: Diagnosis not present

## 2023-12-20 DIAGNOSIS — R062 Wheezing: Secondary | ICD-10-CM | POA: Diagnosis not present

## 2023-12-20 DIAGNOSIS — R197 Diarrhea, unspecified: Secondary | ICD-10-CM | POA: Diagnosis not present

## 2023-12-20 DIAGNOSIS — I10 Essential (primary) hypertension: Secondary | ICD-10-CM | POA: Diagnosis not present

## 2023-12-20 DIAGNOSIS — E860 Dehydration: Secondary | ICD-10-CM | POA: Diagnosis not present

## 2023-12-21 DIAGNOSIS — E876 Hypokalemia: Secondary | ICD-10-CM | POA: Diagnosis not present

## 2023-12-21 DIAGNOSIS — G894 Chronic pain syndrome: Secondary | ICD-10-CM | POA: Diagnosis not present

## 2023-12-21 DIAGNOSIS — J069 Acute upper respiratory infection, unspecified: Secondary | ICD-10-CM | POA: Diagnosis not present

## 2023-12-22 DIAGNOSIS — J441 Chronic obstructive pulmonary disease with (acute) exacerbation: Secondary | ICD-10-CM | POA: Diagnosis not present

## 2023-12-23 DIAGNOSIS — S81801A Unspecified open wound, right lower leg, initial encounter: Secondary | ICD-10-CM | POA: Diagnosis not present

## 2023-12-23 DIAGNOSIS — I10 Essential (primary) hypertension: Secondary | ICD-10-CM | POA: Diagnosis not present

## 2023-12-23 DIAGNOSIS — J449 Chronic obstructive pulmonary disease, unspecified: Secondary | ICD-10-CM | POA: Diagnosis not present

## 2023-12-23 DIAGNOSIS — K219 Gastro-esophageal reflux disease without esophagitis: Secondary | ICD-10-CM | POA: Diagnosis not present

## 2024-01-01 ENCOUNTER — Telehealth: Payer: Self-pay | Admitting: Nurse Practitioner

## 2024-01-01 ENCOUNTER — Ambulatory Visit (INDEPENDENT_AMBULATORY_CARE_PROVIDER_SITE_OTHER): Admitting: Nurse Practitioner

## 2024-01-01 ENCOUNTER — Encounter: Payer: Self-pay | Admitting: Nurse Practitioner

## 2024-01-01 VITALS — BP 193/91 | HR 93 | Temp 97.6°F | Ht 60.0 in | Wt 166.0 lb

## 2024-01-01 DIAGNOSIS — M5431 Sciatica, right side: Secondary | ICD-10-CM

## 2024-01-01 DIAGNOSIS — Z0279 Encounter for issue of other medical certificate: Secondary | ICD-10-CM

## 2024-01-01 MED ORDER — CELECOXIB 200 MG PO CAPS: 200.0000 mg | ORAL_CAPSULE | Freq: Two times a day (BID) | ORAL | 1 refills | Status: DC

## 2024-01-01 MED ORDER — METHYLPREDNISOLONE ACETATE 80 MG/ML IJ SUSP
80.0000 mg | Freq: Once | INTRAMUSCULAR | Status: AC
  Administered 2024-01-01: 80 mg via INTRAMUSCULAR

## 2024-01-01 MED ORDER — KETOROLAC TROMETHAMINE 60 MG/2ML IM SOLN
60.0000 mg | Freq: Once | INTRAMUSCULAR | Status: AC
Start: 2024-01-01 — End: 2024-01-01
  Administered 2024-01-01: 60 mg via INTRAMUSCULAR

## 2024-01-01 NOTE — Patient Instructions (Signed)
Sciatica  Sciatica is pain, numbness, weakness, or tingling along the path of the sciatic nerve. The sciatic nerve starts in the lower back and runs down the back of each leg. The nerve controls the muscles in the lower leg and in the back of the knee. It also provides feeling (sensation) to the back of the thigh, the lower leg, and the sole of the foot. Sciatica is a symptom of another medical condition that pinches or puts pressure on the sciatic nerve. Sciatica most often only affects one side of the body. Sciatica usually goes away on its own or with treatment. In some cases, sciatica may come back (recur). What are the causes? This condition is caused by pressure on the sciatic nerve or pinching of the nerve. This may be the result of: A disk in between the bones of the spine bulging out too far (herniated disk). Age-related changes in the spinal disks. A pain disorder that affects a muscle in the buttock. Extra bone growth near the sciatic nerve. A break (fracture) of the pelvis. Pregnancy. Tumor. This is rare. What increases the risk? The following factors may make you more likely to develop this condition: Playing sports that place pressure or stress on the spine. Having poor strength and flexibility. A history of back injury or surgery. Sitting for long periods of time. Doing activities that involve repetitive bending or lifting. Obesity. What are the signs or symptoms? Symptoms can vary from mild to very severe. They may include: Any of the following problems in the lower back, leg, hip, or buttock: Mild tingling, numbness, or dull aches. Burning sensations. Sharp pains. Numbness in the back of the calf or the sole of the foot. Leg weakness. Severe back pain that makes movement difficult. Symptoms may get worse when you cough, sneeze, or laugh, or when you sit or stand for long periods of time. How is this diagnosed? This condition may be diagnosed based on: Your symptoms  and medical history. A physical exam. Blood tests. Imaging tests, such as: X-rays. An MRI. A CT scan. How is this treated? In many cases, this condition improves on its own without treatment. However, treatment may include: Reducing or modifying physical activity. Exercising, including strengthening and stretching. Icing and applying heat to the affected area. Medicines that help to: Relieve pain and swelling. Relax your muscles. Injections of medicines that help to relieve pain and inflammation (steroids) around the sciatic nerve. Surgery. Follow these instructions at home: Medicines Take over-the-counter and prescription medicines only as told by your health care provider. Ask your health care provider if the medicine prescribed to you requires you to avoid driving or using heavy machinery. Managing pain     If directed, put ice on the affected area. To do this: Put ice in a plastic bag. Place a towel between your skin and the bag. Leave the ice on for 20 minutes, 2-3 times a day. If your skin turns bright red, remove the ice right away to prevent skin damage. The risk of skin damage is higher if you cannot feel pain, heat, or cold. If directed, apply heat to the affected area as often as told by your health care provider. Use the heat source that your health care provider recommends, such as a moist heat pack or a heating pad. Place a towel between your skin and the heat source. Leave the heat on for 20-30 minutes. If your skin turns bright red, remove the heat right away to prevent Gotschall. The   risk of Jansma is higher if you cannot feel pain, heat, or cold. Activity  Return to your normal activities as told by your health care provider. Ask your health care provider what activities are safe for you. Avoid activities that make your symptoms worse. Take brief periods of rest throughout the day. When you rest for longer periods, mix in some mild activity or stretching between  periods of rest. This will help to prevent stiffness and pain. Avoid sitting for long periods of time without moving. Get up and move around at least one time each hour. Exercise and stretch regularly as told by your health care provider. Do not lift anything that is heavier than 10 lb (4.5 kg) until your health care provider says that it is safe. When you do not have symptoms, you should still avoid heavy lifting, especially repetitive heavy lifting. When you lift objects, always use proper lifting technique, which includes: Bending your knees. Keeping the load close to your body. Avoiding twisting. General instructions Maintain a healthy weight. Excess weight puts extra stress on your back. Wear supportive, comfortable shoes. Avoid wearing high heels. Avoid sleeping on a mattress that is too soft or too hard. A mattress that is firm enough to support your back when you sleep may help to reduce your pain. Contact a health care provider if: Your pain is not controlled by medicine. Your pain does not improve or gets worse. Your pain lasts longer than 4 weeks. You have unexplained weight loss. Get help right away if: You are not able to control when you urinate or have bowel movements (incontinence). You have: Weakness in your lower back, pelvis, buttocks, or legs that gets worse. Redness or swelling of your back. A burning sensation when you urinate. Summary Sciatica is pain, numbness, weakness, or tingling along the path of the sciatic nerve, which may include the lower back, legs, hips, and buttocks. This condition is caused by pressure on the sciatic nerve or pinching of the nerve. Treatment often includes rest, exercise, medicines, and applying ice or heat. This information is not intended to replace advice given to you by your health care provider. Make sure you discuss any questions you have with your health care provider. Document Revised: 12/27/2021 Document Reviewed:  12/27/2021 Elsevier Patient Education  2024 [employer]  

## 2024-01-01 NOTE — Progress Notes (Signed)
 Subjective:    Patient ID: [name], female    DOB: Apr 19, 1946, 78 y.o.   MRN: [id number]   Chief Complaint: hip pain  Patient in c/o hip pain. She was seen 12/11/23 with hip pain and hip xray just showed degenerative changes.  *Patient tried to OD on pain medication and xanax 2 weeks ago. Has been in the hospital.   Hip Pain  The incident occurred more than 1 week ago. There was no injury mechanism. The pain is at a severity of 9/10 (the pain radiates all teh way down the back of right leg). The pain is severe. The pain has been Constant since onset. She reports no foreign bodies present. Nothing aggravates the symptoms. She has tried acetaminophen for the symptoms.    Patient Active Problem List   Diagnosis Date Noted   Prolapsed cervical intervertebral disc 07/28/2020   Radiculopathy, cervical region 06/30/2020   Spondylolisthesis 06/30/2020   DNR (do not resuscitate) 05/24/2020   IBS (irritable bowel syndrome)    Aortic atherosclerosis (HCC) 08/03/2017   Cigarette smoker 07/28/2016   BMI 32.0-32.9,adult 06/23/2015   Depression 02/23/2015   Hyperlipidemia with target LDL less than 100 02/23/2015   COPD GOLD ? / active smoker  11/26/2012   Anxiety 11/26/2012   Hypokalemia 11/26/2012   Benign hypertension 11/26/2012   Chronic lower back pain 11/26/2012   GERD (gastroesophageal reflux disease) 12/29/2011   Dysphagia 06/30/2011       Review of Systems  Constitutional:  Negative for diaphoresis.  Eyes:  Negative for pain.  Respiratory:  Negative for shortness of breath.   Cardiovascular:  Negative for chest pain, palpitations and leg swelling.  Gastrointestinal:  Negative for abdominal pain.  Endocrine: Negative for polydipsia.  Musculoskeletal:  Positive for arthralgias (right hip pain).  Skin:  Negative for rash.  Neurological:  Negative for dizziness, weakness and headaches.  Hematological:  Does not bruise/bleed easily.  All other systems reviewed and  are negative.      Objective:   Physical Exam Constitutional:      Appearance: Normal appearance.  Cardiovascular:     Rate and Rhythm: Normal rate and regular rhythm.     Heart sounds: Normal heart sounds.  Pulmonary:     Effort: Pulmonary effort is normal.     Breath sounds: Normal breath sounds.  Skin:    General: Skin is warm.  Neurological:     General: No focal deficit present.     Mental Status: She is alert and oriented to person, place, and time.  Psychiatric:        Mood and Affect: Mood normal.        Behavior: Behavior normal.    BP (!) 193/91   Pulse 93   Temp 97.6 F (36.4 C) (Temporal)   Ht 5' (1.524 m)   Wt 166 lb (75.3 kg)   SpO2 90%   BMI 32.42 kg/m         Assessment & Plan:   [name] in today with chief complaint of Hip Pain (Right hip/)   1. Sciatica of right side (Primary) Moist heat Stretches RTO prn - methylPREDNISolone acetate (DEPO-MEDROL) injection 80 mg - ketorolac (TORADOL) injection 60 mg - celecoxib (CELEBREX) 200 MG capsule; Take 1 capsule (200 mg total) by mouth 2 (two) times daily.  Dispense: 60 capsule; Refill: 1    The above assessment and management plan was discussed with the patient. The patient verbalized understanding of and has agreed to  the management plan. Patient is aware to call the clinic if symptoms persist or worsen. Patient is aware when to return to the clinic for a follow-up visit. Patient educated on when it is appropriate to go to the emergency department.   Mary-Margaret Daphine Deutscher, FNP

## 2024-01-01 NOTE — Telephone Encounter (Signed)
 [name]  dropped off FMLA forms to be completed and signed.  Form Fee Paid? (Y/N)       YES     If NO, form is placed on front office manager desk to hold until payment received. If YES, then form will be placed in the RX/HH Nurse Coordinators box for completion.  Form will not be processed until payment is received

## 2024-01-05 NOTE — Telephone Encounter (Signed)
 [facility] PCP completed and signed FMLA forms. They have been faxed to [employer] at fax number [phone]. Patient has been contacted and informed they are complete. Copy at front desk.

## 2024-01-11 ENCOUNTER — Ambulatory Visit: Payer: Self-pay

## 2024-01-11 ENCOUNTER — Ambulatory Visit: Admitting: Nurse Practitioner

## 2024-01-11 ENCOUNTER — Encounter: Payer: Self-pay | Admitting: Nurse Practitioner

## 2024-01-11 VITALS — BP 147/88 | HR 78 | Temp 97.4°F | Ht 60.0 in | Wt 166.0 lb

## 2024-01-11 DIAGNOSIS — T40601A Poisoning by unspecified narcotics, accidental (unintentional), initial encounter: Secondary | ICD-10-CM | POA: Insufficient documentation

## 2024-01-11 DIAGNOSIS — M5431 Sciatica, right side: Secondary | ICD-10-CM | POA: Diagnosis not present

## 2024-01-11 DIAGNOSIS — T40602D Poisoning by unspecified narcotics, intentional self-harm, subsequent encounter: Secondary | ICD-10-CM

## 2024-01-11 MED ORDER — KETOROLAC TROMETHAMINE 60 MG/2ML IM SOLN
60.0000 mg | Freq: Once | INTRAMUSCULAR | Status: AC
  Administered 2024-01-11: 60 mg via INTRAMUSCULAR

## 2024-01-11 MED ORDER — METHYLPREDNISOLONE ACETATE 80 MG/ML IJ SUSP
80.0000 mg | Freq: Once | INTRAMUSCULAR | Status: AC
  Administered 2024-01-11: 80 mg via INTRAMUSCULAR

## 2024-01-11 NOTE — Telephone Encounter (Signed)
 Copied from CRM [id number]. Topic: Clinical - Red Word Triage >> Jan 11, 2024 10:21 AM Fuller Mandril wrote: Red Word that prompted transfer to Nurse Triage: Terrible Pain from celebrex prescribed by provider.    Chief Complaint: Back Pain Symptoms: Pain Frequency: 4-5 months but worse recently Pertinent Negatives: Patient denies blood in urine, burning with urination, fever Disposition: [] ED /[] Urgent Care (no appt availability in office) / [] Appointment(In office/virtual)/ []  [facility]/ [] Home Care/ [x] Refused Recommended Disposition /[] [facility]/ []  Follow-up with PCP Additional Notes: Patient called and advised that she was put on Celebrex a week ago but she can't take this medication because it is upsetting her stomach. She states that she still in severe pain with sciatic nerve pain. Patient states that she knows she cannot have any narcotic pain medicines due to her recent medical history but she states that she has to have something. Patient denies any blood in her urine, burning with urination, fever, abdominal pain. Patient is advised that the recommendation is for her to come in to the office for further examination and patient refused to come in at this time.  She states that her PCP is familiar with her and her pain.  She just wants the provider to call her in something for the pain if possible or give her a shot of pain medication.  Patient is advised that if she receives a shot of medication she would have to physically be present in the office. Patient seemed in a lot of pain during Triage with this RN. Patient is also advised that if she gets worse to seek immediate medical attention at the Emergency Room. Patient states that she was considering this. This RN advised her that if she is that much pain then to go to the Emergency Room or call 911 to take her to the Emergency Room to get herself taken care of. Patient verbalized understanding.  Reason for  Disposition  [1] SEVERE back pain (e.g., excruciating, unable to do any normal activities) AND [2] not improved 2 hours after pain medicine  Answer Assessment - Initial Assessment Questions 1. ONSET: "When did the pain begin?"      4-5 months 2. LOCATION: "Where does it hurt?" (upper, mid or lower back)     Middle of lowerback/buttocks area 3. SEVERITY: "How bad is the pain?"  (e.g., Scale 1-10; mild, moderate, or severe)   - MILD (1-3): Doesn't interfere with normal activities.    - MODERATE (4-7): Interferes with normal activities or awakens from sleep.    - SEVERE (8-10): Excruciating pain, unable to do any normal activities.      10 4. PATTERN: "Is the pain constant?" (e.g., yes, no; constant, intermittent)      Constant and worse at times 5. RADIATION: "Does the pain shoot into your legs or somewhere else?"     Pain radiates down right hip/leg 6. CAUSE:  "What do you think is causing the back pain?"      unsure 7. BACK OVERUSE:  "Any recent lifting of heavy objects, strenuous work or exercise?"     ------ 8. MEDICINES: "What have you taken so far for the pain?" (e.g., nothing, acetaminophen, NSAIDS)     Tylenol 9. NEUROLOGIC SYMPTOMS: "Do you have any weakness, numbness, or problems with bowel/bladder control?"     unknown 10. OTHER SYMPTOMS: "Do you have any other symptoms?" (e.g., fever, abdomen pain, burning with urination, blood in urine)       Patient denies  Protocols used: Back Pain-A-AH

## 2024-01-11 NOTE — Progress Notes (Signed)
   Subjective:    Patient ID: [name], female    DOB: April 16, 1946, 78 y.o.   MRN: [id number]   Chief Complaint: sciatica Pain  HPI  Right hip pain that radiates down back of leg. She is on celebrex and she says it is upsetting her stomach.    Patient was on pain meds daily , but she OD on  them and had to go to the hospital and was admitted to psych for 2 weeks. She was told at last visit that she will get no more controlled meds form our office. Patient Active Problem List   Diagnosis Date Noted   Prolapsed cervical intervertebral disc 07/28/2020   Radiculopathy, cervical region 06/30/2020   Spondylolisthesis 06/30/2020   DNR (do not resuscitate) 05/24/2020   IBS (irritable bowel syndrome)    Aortic atherosclerosis (HCC) 08/03/2017   Cigarette smoker 07/28/2016   BMI 32.0-32.9,adult 06/23/2015   Depression 02/23/2015   Hyperlipidemia with target LDL less than 100 02/23/2015   COPD GOLD ? / active smoker  11/26/2012   Anxiety 11/26/2012   Hypokalemia 11/26/2012   Benign hypertension 11/26/2012   Chronic lower back pain 11/26/2012   GERD (gastroesophageal reflux disease) 12/29/2011   Dysphagia 06/30/2011       Review of Systems     Objective:   Physical Exam Constitutional:      Appearance: Normal appearance.  Musculoskeletal:     Comments: FROM of right hip without pain Pain in right buttocks that radiates down leg with negative SLR.  Skin:    General: Skin is warm.  Neurological:     General: No focal deficit present.     Mental Status: She is alert and oriented to person, place, and time.  Psychiatric:        Mood and Affect: Mood normal.        Behavior: Behavior normal.     BP (!) 147/88   Pulse 78   Temp (!) 97.4 F (36.3 C) (Temporal)   Ht 5' (1.524 m)   Wt 166 lb (75.3 kg)   BMI 32.42 kg/m        Assessment & Plan:  [name] in today with chief complaint of back and hip pain   1. Opiate or related narcotic overdose,  intentional self-harm, subsequent encounter (Primary) No narcotics allowed  2. Sciatica of right side Moist heat Stretches RTOprn - methylPREDNISolone acetate (DEPO-MEDROL) injection 80 mg - ketorolac (TORADOL) injection 60 mg - Ambulatory referral to Physical Therapy    The above assessment and management plan was discussed with the patient. The patient verbalized understanding of and has agreed to the management plan. Patient is aware to call the clinic if symptoms persist or worsen. Patient is aware when to return to the clinic for a follow-up visit. Patient educated on when it is appropriate to go to the emergency department.   Mary-Margaret Daphine Deutscher, FNP

## 2024-01-11 NOTE — Telephone Encounter (Signed)
 Called [facility] to just advise them that this RN was concerned about this patient and how much pain she was in.

## 2024-01-11 NOTE — Patient Instructions (Signed)
 Sciatica  Sciatica is pain, weakness, tingling, or loss of feeling (numbness) along the sciatic nerve. The sciatic nerve starts in the lower back and goes down the back of each leg. Sciatica usually affects one side of the body. Sciatica usually goes away on its own or with treatment. Sometimes, sciatica may come back. What are the causes? This condition happens when the sciatic nerve is pinched or has pressure put on it. This may be caused by: A disk in between the bones of the spine bulging out too far (herniated disk). Changes in the spinal disks due to aging. A condition that affects a muscle in the butt. Extra bone growth near the sciatic nerve. A break (fracture) of the area between your hip bones (pelvis). Pregnancy. Tumor. This is rare. What increases the risk? You are more likely to develop this condition if you: Play sports that put pressure or stress on the spine. Have poor strength and ease of movement (flexibility). Have had a back injury or back surgery. Sit for long periods of time. Do activities that involve bending or lifting over and over again. Are very overweight (obese). What are the signs or symptoms? Symptoms can vary from mild to very bad. They may include: Any of these problems in the lower back, leg, hip, or butt: Mild tingling, loss of feeling, or dull aches. A burning feeling. Sharp pains. Loss of feeling in the back of the calf or the sole of the foot. Leg weakness. Very bad back pain that makes it hard to move. These symptoms may get worse when you cough, sneeze, or laugh. They may also get worse when you sit or stand for long periods of time. How is this treated? This condition often gets better without any treatment. However, treatment may include: Changing or cutting back on physical activity when you have pain. Exercising, including strengthening and stretching. Putting ice or heat on the affected area. Shots of medicines to relieve pain and  swelling or to relax your muscles. Surgery. Follow these instructions at home: Medicines Take over-the-counter and prescription medicines only as told by your doctor. Ask your doctor if you should avoid driving or using machines while you are taking your medicine. Managing pain     If told, put ice on the affected area. To do this: Put ice in a plastic bag. Place a towel between your skin and the bag. Leave the ice on for 20 minutes, 2-3 times a day. If your skin turns bright red, take off the ice right away to prevent skin damage. The risk of skin damage is higher if you cannot feel pain, heat, or cold. If told, put heat on the affected area. Do this as often as told by your doctor. Use the heat source that your doctor tells you to use, such as a moist heat pack or a heating pad. Place a towel between your skin and the heat source. Leave the heat on for 20-30 minutes. If your skin turns bright red, take off the heat right away to prevent burns. The risk of burns is higher if you cannot feel pain, heat, or cold. Activity  Return to your normal activities when your doctor says that it is safe. Avoid activities that make your symptoms worse. Take short rests during the day. When you rest for a long time, do some physical activity or stretching between periods of rest. Avoid sitting for a long time without moving. Get up and move around at least one time each  hour. Do exercises and stretches as told by your doctor. Do not lift anything that is heavier than 10 lb (4.5 kg). Avoid lifting heavy things even when you do not have symptoms. Avoid lifting heavy things over and over. When you lift objects, always lift in a way that is safe for your body. To do this, you should: Bend your knees. Keep the object close to your body. Avoid twisting. General instructions Stay at a healthy weight. Wear comfortable shoes that support your feet. Avoid wearing high heels. Avoid sleeping on a mattress  that is too soft or too hard. You might have less pain if you sleep on a mattress that is firm enough to support your back. Contact a doctor if: Your pain is not controlled by medicine. Your pain does not get better. Your pain gets worse. Your pain lasts longer than 4 weeks. You lose weight without trying. Get help right away if: You cannot control when you pee (urinate) or poop (have a bowel movement). You have weakness in any of these areas and it gets worse: Lower back. The area between your hip bones. Butt. Legs. You have redness or swelling of your back. You have a burning feeling when you pee. Summary Sciatica is pain, weakness, tingling, or loss of feeling (numbness) along the sciatic nerve. This may include the lower back, legs, hips, and butt. This condition happens when the sciatic nerve is pinched or has pressure put on it. Treatment often includes rest, exercise, medicines, and putting ice or heat on the affected area. This information is not intended to replace advice given to you by your health care provider. Make sure you discuss any questions you have with your health care provider. Document Revised: 12/27/2021 Document Reviewed: 12/27/2021 Elsevier Patient Education  2024 [employer].

## 2024-01-22 ENCOUNTER — Other Ambulatory Visit: Payer: Self-pay | Admitting: Nurse Practitioner

## 2024-01-22 DIAGNOSIS — J441 Chronic obstructive pulmonary disease with (acute) exacerbation: Secondary | ICD-10-CM | POA: Diagnosis not present

## 2024-01-22 DIAGNOSIS — J449 Chronic obstructive pulmonary disease, unspecified: Secondary | ICD-10-CM

## 2024-02-07 ENCOUNTER — Ambulatory Visit: Attending: Nurse Practitioner

## 2024-02-07 ENCOUNTER — Ambulatory Visit: Payer: Self-pay

## 2024-02-07 NOTE — Telephone Encounter (Signed)
 Will address during appointment. LS

## 2024-02-07 NOTE — Telephone Encounter (Signed)
 Pt states that she would like to know if PCP could do cortisone shots in her back again. Pt states that they have helped in the past. RN advised that pt sees PCP tomorrow, pt was unaware that she had an appt tomorrow. Pt states that she would like to speak to PCP about this tomorrow.  Copied from CRM [id number]. Topic: Clinical - Red Word Triage >> Feb 07, 2024 11:59 AM Danelle Dunning F wrote: [facility] that prompted transfer to Nurse Triage:    Sciatic pain in back going down right hip  Patient's prescription for celecoxib  (CELEBREX ) 200 MG capsule is too strong for her stomach; Patient has been taking a combination of tylenol  and motrin but its not helping much Reason for Disposition . General information question, no triage required and triager able to answer question  Protocols used: Information Only Call - No Triage-A-AH

## 2024-02-08 ENCOUNTER — Encounter: Payer: Self-pay | Admitting: Nurse Practitioner

## 2024-02-08 ENCOUNTER — Ambulatory Visit: Admitting: Nurse Practitioner

## 2024-02-08 VITALS — BP 149/87 | HR 96 | Temp 98.0°F | Ht 60.0 in | Wt 165.0 lb

## 2024-02-08 DIAGNOSIS — M5431 Sciatica, right side: Secondary | ICD-10-CM | POA: Diagnosis not present

## 2024-02-08 MED ORDER — METHYLPREDNISOLONE ACETATE 80 MG/ML IJ SUSP
80.0000 mg | Freq: Once | INTRAMUSCULAR | Status: AC
  Administered 2024-02-08: 80 mg via INTRAMUSCULAR

## 2024-02-08 MED ORDER — KETOROLAC TROMETHAMINE 60 MG/2ML IM SOLN
60.0000 mg | Freq: Once | INTRAMUSCULAR | Status: AC
Start: 2024-02-08 — End: 2024-02-08
  Administered 2024-02-08: 60 mg via INTRAMUSCULAR

## 2024-02-08 NOTE — Progress Notes (Signed)
 Subjective:    Patient ID: [name], female    DOB: [birth year], 78 y.o.   MRN: [id number]   Chief Complaint: Hip Pain (Right)   Hip Pain     Patient was originally scheduled for pain management. This appointment was made prior to her suicidal attempt with her previous prescription of pain meds. SHe is aware that she will get no more pain medication form our office. She wanted to be seen anyway because of her right  hip pain. Her hi[p pain has been going on for awhile. She rates pain 8/10 currently. She has been taking motrin which helps some. The celebrex  we prescribed messed her stomach up.  Last hip xray was done on 12/11/23-Minor degenerative spurring of the right hip.  Patient Active Problem List   Diagnosis Date Noted   Overdose of opiate or related narcotic (HCC) 01/11/2024   Prolapsed cervical intervertebral disc 07/28/2020   Radiculopathy, cervical region 06/30/2020   Spondylolisthesis 06/30/2020   DNR (do not resuscitate) 05/24/2020   IBS (irritable bowel syndrome)    Aortic atherosclerosis (HCC) 08/03/2017   Cigarette smoker 07/28/2016   BMI 32.0-32.9,adult 06/23/2015   Depression 02/23/2015   Hyperlipidemia with target LDL less than 100 02/23/2015   COPD GOLD ? / active smoker  11/26/2012   Anxiety 11/26/2012   Hypokalemia 11/26/2012   Benign hypertension 11/26/2012   Chronic lower back pain 11/26/2012   GERD (gastroesophageal reflux disease) 12/29/2011   Dysphagia 06/30/2011       Review of Systems  Constitutional:  Negative for diaphoresis.  Eyes:  Negative for pain.  Respiratory:  Negative for shortness of breath.   Cardiovascular:  Negative for chest pain, palpitations and leg swelling.  Gastrointestinal:  Negative for abdominal pain.  Endocrine: Negative for polydipsia.  Skin:  Negative for rash.  Neurological:  Negative for dizziness, weakness and headaches.  Hematological:  Does not bruise/bleed easily.  All other systems reviewed and are  negative.      Objective:   Physical Exam Constitutional:      Appearance: Normal appearance.  Cardiovascular:     Rate and Rhythm: Normal rate and regular rhythm.     Heart sounds: Normal heart sounds.  Pulmonary:     Effort: Pulmonary effort is normal.     Breath sounds: Normal breath sounds.  Skin:    General: Skin is warm.  Neurological:     General: No focal deficit present.     Mental Status: She is alert and oriented to person, place, and time.  Psychiatric:        Mood and Affect: Mood normal.        Behavior: Behavior normal.    BP (!) 149/87   Pulse 96   Temp 98 F (36.7 C) (Temporal)   Ht 5' (1.524 m)   Wt 165 lb (74.8 kg)   SpO2 90%   BMI 32.22 kg/m         Assessment & Plan:   Buena Carmine in today with chief complaint of Hip Pain (Right)   1. Sciatica of right side (Primary) Moist heat Stretches Referral to ortho - methylPREDNISolone  acetate (DEPO-MEDROL ) injection 80 mg - ketorolac  (TORADOL ) injection 60 mg - Ambulatory referral to Orthopedic Surgery    The above assessment and management plan was discussed with the patient. The patient verbalized understanding of and has agreed to the management plan. Patient is aware to call the clinic if symptoms persist or worsen. Patient is aware when to  return to the clinic for a follow-up visit. Patient educated on when it is appropriate to go to the emergency department.   Mary-Margaret Gaylyn Keas, FNP

## 2024-02-08 NOTE — Patient Instructions (Signed)
 Sciatica  Sciatica is pain, weakness, tingling, or loss of feeling (numbness) along the sciatic nerve. The sciatic nerve starts in the lower back and goes down the back of each leg. Sciatica usually affects one side of the body. Sciatica usually goes away on its own or with treatment. Sometimes, sciatica may come back. What are the causes? This condition happens when the sciatic nerve is pinched or has pressure put on it. This may be caused by: A disk in between the bones of the spine bulging out too far (herniated disk). Changes in the spinal disks due to aging. A condition that affects a muscle in the butt. Extra bone growth near the sciatic nerve. A break (fracture) of the area between your hip bones (pelvis). Pregnancy. Tumor. This is rare. What increases the risk? You are more likely to develop this condition if you: Play sports that put pressure or stress on the spine. Have poor strength and ease of movement (flexibility). Have had a back injury or back surgery. Sit for long periods of time. Do activities that involve bending or lifting over and over again. Are very overweight (obese). What are the signs or symptoms? Symptoms can vary from mild to very bad. They may include: Any of these problems in the lower back, leg, hip, or butt: Mild tingling, loss of feeling, or dull aches. A burning feeling. Sharp pains. Loss of feeling in the back of the calf or the sole of the foot. Leg weakness. Very bad back pain that makes it hard to move. These symptoms may get worse when you cough, sneeze, or laugh. They may also get worse when you sit or stand for long periods of time. How is this treated? This condition often gets better without any treatment. However, treatment may include: Changing or cutting back on physical activity when you have pain. Exercising, including strengthening and stretching. Putting ice or heat on the affected area. Shots of medicines to relieve pain and  swelling or to relax your muscles. Surgery. Follow these instructions at home: Medicines Take over-the-counter and prescription medicines only as told by your doctor. Ask your doctor if you should avoid driving or using machines while you are taking your medicine. Managing pain     If told, put ice on the affected area. To do this: Put ice in a plastic bag. Place a towel between your skin and the bag. Leave the ice on for 20 minutes, 2-3 times a day. If your skin turns bright red, take off the ice right away to prevent skin damage. The risk of skin damage is higher if you cannot feel pain, heat, or cold. If told, put heat on the affected area. Do this as often as told by your doctor. Use the heat source that your doctor tells you to use, such as a moist heat pack or a heating pad. Place a towel between your skin and the heat source. Leave the heat on for 20-30 minutes. If your skin turns bright red, take off the heat right away to prevent burns. The risk of burns is higher if you cannot feel pain, heat, or cold. Activity  Return to your normal activities when your doctor says that it is safe. Avoid activities that make your symptoms worse. Take short rests during the day. When you rest for a long time, do some physical activity or stretching between periods of rest. Avoid sitting for a long time without moving. Get up and move around at least one time each  hour. Do exercises and stretches as told by your doctor. Do not lift anything that is heavier than 10 lb (4.5 kg). Avoid lifting heavy things even when you do not have symptoms. Avoid lifting heavy things over and over. When you lift objects, always lift in a way that is safe for your body. To do this, you should: Bend your knees. Keep the object close to your body. Avoid twisting. General instructions Stay at a healthy weight. Wear comfortable shoes that support your feet. Avoid wearing high heels. Avoid sleeping on a mattress  that is too soft or too hard. You might have less pain if you sleep on a mattress that is firm enough to support your back. Contact a doctor if: Your pain is not controlled by medicine. Your pain does not get better. Your pain gets worse. Your pain lasts longer than 4 weeks. You lose weight without trying. Get help right away if: You cannot control when you pee (urinate) or poop (have a bowel movement). You have weakness in any of these areas and it gets worse: Lower back. The area between your hip bones. Butt. Legs. You have redness or swelling of your back. You have a burning feeling when you pee. Summary Sciatica is pain, weakness, tingling, or loss of feeling (numbness) along the sciatic nerve. This may include the lower back, legs, hips, and butt. This condition happens when the sciatic nerve is pinched or has pressure put on it. Treatment often includes rest, exercise, medicines, and putting ice or heat on the affected area. This information is not intended to replace advice given to you by your health care provider. Make sure you discuss any questions you have with your health care provider. Document Revised: 12/27/2021 Document Reviewed: 12/27/2021 Elsevier Patient Education  2024 [employer].

## 2024-02-21 DIAGNOSIS — J441 Chronic obstructive pulmonary disease with (acute) exacerbation: Secondary | ICD-10-CM | POA: Diagnosis not present

## 2024-02-25 ENCOUNTER — Other Ambulatory Visit: Payer: Self-pay | Admitting: Nurse Practitioner

## 2024-02-25 DIAGNOSIS — M5431 Sciatica, right side: Secondary | ICD-10-CM

## 2024-03-19 ENCOUNTER — Other Ambulatory Visit: Payer: Self-pay | Admitting: Nurse Practitioner

## 2024-03-19 DIAGNOSIS — J449 Chronic obstructive pulmonary disease, unspecified: Secondary | ICD-10-CM

## 2024-03-23 DIAGNOSIS — J441 Chronic obstructive pulmonary disease with (acute) exacerbation: Secondary | ICD-10-CM | POA: Diagnosis not present

## 2024-04-09 ENCOUNTER — Telehealth: Payer: Self-pay | Admitting: Family Medicine

## 2024-04-09 NOTE — Telephone Encounter (Signed)
 Copied from CRM [id number]. Topic: Clinical - Medical Advice >> Apr 08, 2024  4:17 PM Merlynn A wrote: Reason for CRM: Patients daughter called in regarding FMLA paperwork. She is requesting for paperwork to be updated per employer for days needed off for mother. Please contact to discuss as there are no docs in chart that are recent pertaining to [facility] paperwork being received for update. Alan can  be reached at [phone]

## 2024-04-09 NOTE — Telephone Encounter (Signed)
 TC back from [employer] needing to increase frequency of intermittent time off on FMLA, printed copy of last FMLA and placed on PCPs desk.

## 2024-04-09 NOTE — Telephone Encounter (Signed)
 LMOVM in regards to discussing FMLA paperwork that was done at the end of March.

## 2024-04-09 NOTE — Telephone Encounter (Signed)
 Please review

## 2024-04-11 NOTE — Telephone Encounter (Signed)
 LMOVM updated FMLA has been faxed to [employer].

## 2024-04-22 DIAGNOSIS — J441 Chronic obstructive pulmonary disease with (acute) exacerbation: Secondary | ICD-10-CM | POA: Diagnosis not present

## 2024-04-23 ENCOUNTER — Telehealth: Payer: Self-pay

## 2024-04-23 NOTE — Telephone Encounter (Signed)
 CCing the PCP for follow-up when she returns.  These are chronic medications for the patient

## 2024-04-23 NOTE — Telephone Encounter (Signed)
 Copied from CRM [id number]. Topic: Clinical - Medication Question >> Apr 23, 2024 11:26 AM Larissa RAMAN wrote: Reason for CRM: [place], Pharmacist with [facility] calling to inform provider of medication interactions of confusion and sedation. Medications include Hydrocodone , Alprazolam , PARoxetine  (PAXIL ) 40 MG tablet, and dicyclomine  (BENTYL ) 10 MG capsule.   Alternative Medications : Tylenol  Another SSRI DOCUSATE for constipation BUSPIRONE  Callback # [phone]

## 2024-04-29 NOTE — Telephone Encounter (Signed)
 Patient is no longer getting hydrocodone  from our office

## 2024-05-09 ENCOUNTER — Emergency Department (HOSPITAL_COMMUNITY)

## 2024-05-09 ENCOUNTER — Emergency Department (HOSPITAL_COMMUNITY)
Admission: EM | Admit: 2024-05-09 | Discharge: 2024-05-09 | Disposition: A | Discharge status: Home / Self Care | Attending: Emergency Medicine | Admitting: Emergency Medicine

## 2024-05-09 ENCOUNTER — Encounter (HOSPITAL_COMMUNITY): Payer: Self-pay | Admitting: Emergency Medicine

## 2024-05-09 ENCOUNTER — Other Ambulatory Visit: Payer: Self-pay

## 2024-05-09 DIAGNOSIS — R062 Wheezing: Secondary | ICD-10-CM | POA: Diagnosis not present

## 2024-05-09 DIAGNOSIS — I10 Essential (primary) hypertension: Secondary | ICD-10-CM | POA: Insufficient documentation

## 2024-05-09 DIAGNOSIS — R0602 Shortness of breath: Secondary | ICD-10-CM | POA: Diagnosis not present

## 2024-05-09 DIAGNOSIS — J449 Chronic obstructive pulmonary disease, unspecified: Secondary | ICD-10-CM | POA: Diagnosis not present

## 2024-05-09 DIAGNOSIS — F17210 Nicotine dependence, cigarettes, uncomplicated: Secondary | ICD-10-CM | POA: Insufficient documentation

## 2024-05-09 DIAGNOSIS — R42 Dizziness and giddiness: Secondary | ICD-10-CM | POA: Diagnosis not present

## 2024-05-09 DIAGNOSIS — Z7982 Long term (current) use of aspirin: Secondary | ICD-10-CM | POA: Insufficient documentation

## 2024-05-09 DIAGNOSIS — M5441 Lumbago with sciatica, right side: Secondary | ICD-10-CM | POA: Diagnosis not present

## 2024-05-09 DIAGNOSIS — R140 Abdominal distension (gaseous): Secondary | ICD-10-CM | POA: Diagnosis not present

## 2024-05-09 DIAGNOSIS — Z79899 Other long term (current) drug therapy: Secondary | ICD-10-CM | POA: Insufficient documentation

## 2024-05-09 DIAGNOSIS — M5431 Sciatica, right side: Secondary | ICD-10-CM | POA: Insufficient documentation

## 2024-05-09 LAB — BASIC METABOLIC PANEL WITH GFR
Anion gap: 11 (ref 5–15)
BUN: 14 mg/dL (ref 8–23)
CO2: 25 mmol/L (ref 22–32)
Calcium: 9.1 mg/dL (ref 8.9–10.3)
Chloride: 101 mmol/L (ref 98–111)
Creatinine, Ser: 0.56 mg/dL (ref 0.44–1.00)
GFR, Estimated: >60 mL/min (ref >60)
Glucose, Bld: 113 mg/dL — ABNORMAL HIGH (ref 70–99)
Potassium: 3.2 mmol/L — ABNORMAL LOW (ref 3.5–5.1)
Sodium: 137 mmol/L (ref 135–145)

## 2024-05-09 LAB — MAGNESIUM: Magnesium: 1.8 mg/dL (ref 1.7–2.4)

## 2024-05-09 LAB — HEPATIC FUNCTION PANEL
ALT: 27 U/L (ref 0–44)
AST: 38 U/L (ref 15–41)
Albumin: 3.6 g/dL (ref 3.5–5.0)
Alkaline Phosphatase: 66 U/L (ref 38–126)
Bilirubin, Direct: 0.1 mg/dL (ref 0.0–0.2)
Indirect Bilirubin: 0.4 mg/dL (ref 0.3–0.9)
Total Bilirubin: 0.5 mg/dL (ref 0.0–1.2)
Total Protein: 6.3 g/dL — ABNORMAL LOW (ref 6.5–8.1)

## 2024-05-09 LAB — URINALYSIS, ROUTINE W REFLEX MICROSCOPIC
Bilirubin Urine: NEGATIVE
Glucose, UA: NEGATIVE mg/dL
Hgb urine dipstick: NEGATIVE
Ketones, ur: NEGATIVE mg/dL
Leukocytes,Ua: NEGATIVE
Nitrite: NEGATIVE
Protein, ur: NEGATIVE mg/dL
Specific Gravity, Urine: 1.013 (ref 1.005–1.030)
pH: 5 (ref 5.0–8.0)

## 2024-05-09 LAB — CBC
HCT: 39.9 % (ref 36.0–46.0)
Hemoglobin: 13.2 g/dL (ref 12.0–15.0)
MCH: 31.1 pg (ref 26.0–34.0)
MCHC: 33.1 g/dL (ref 30.0–36.0)
MCV: 93.9 fL (ref 80.0–100.0)
Platelets: 284 K/uL (ref 150–400)
RBC: 4.25 MIL/uL (ref 3.87–5.11)
RDW: 14.1 % (ref 11.5–15.5)
WBC: 8.3 K/uL (ref 4.0–10.5)
nRBC: 0 % (ref 0.0–0.2)

## 2024-05-09 MED ORDER — DEXAMETHASONE SODIUM PHOSPHATE 10 MG/ML IJ SOLN
10.0000 mg | Freq: Once | INTRAMUSCULAR | Status: AC
  Administered 2024-05-09: 10 mg via INTRAMUSCULAR
  Filled 2024-05-09: qty 1

## 2024-05-09 MED ORDER — ALBUTEROL SULFATE HFA 108 (90 BASE) MCG/ACT IN AERS: 2.0000 | INHALATION_SPRAY | RESPIRATORY_TRACT | Status: DC | PRN

## 2024-05-09 NOTE — ED Notes (Signed)
 ED Provider at bedside.

## 2024-05-09 NOTE — ED Triage Notes (Signed)
 Pt brought in by daughter after pt called mother stating she didn't feel right. Pt states head is woozy and that she can't hardly breathe. Pt ambulatory to bathroom without assistance. Pt also c/o R hip pain (has sciatica). Pt also reports feeling nervous and jittery.

## 2024-05-09 NOTE — ED Provider Notes (Signed)
 [place] EMERGENCY DEPARTMENT AT [facility] Provider Note   CSN: [id number] Arrival date & time: 05/09/24  2035     Patient presents with: Dizziness and Shortness of Breath   [name] is a 78 y.o. female.    Dizziness Associated symptoms: shortness of breath   Shortness of Breath    This patient is a 78 year old female, she has a history of anxiety and depression, she has some chronic pain and unfortunately in March she took an overdose of her Vicodin and ended up getting involuntarily committed sent to a psychiatric hospital and has been clean off of those medications since, she states that she still struggles with anxiety despite taking Paxil .  She does have a history of hypertension and takes valsartan  hydrochlorothiazide  and has chronic dysuria for which she is on phenazopyridine.  She also complains of chronic pain in her back and sciatic pain going down her right leg, she has been taking Tylenol  with minimal improvement.  She reports she had to stop taking the cholesterol medicine in the past because of liver damage.  Today she states that she has been dizzy feeling lightheaded like she is going to pass out at times, no palpitations or chest pain, no shortness of breath, she does continue to smoke cigarettes.  She has had no fevers or chills, no diarrhea, no swelling of the legs, no numbness or weakness.  Prior to Admission medications   Medication Sig Start Date End Date Taking? Authorizing Provider  albuterol  (VENTOLIN  HFA) 108 (90 Base) MCG/ACT inhaler INHALE 2 PUFFS INTO THE LUNGS EVERY 6 HOURS AS NEEDED. 03/19/24   Gladis Mustard, FNP  Aspirin-Acetaminophen -Caffeine (GOODYS EXTRA STRENGTH) 500-325-65 MG PACK Take 1 packet by mouth 3 (three) times daily as needed (pain.).    [provider]  Calcium  Carb-Cholecalciferol  (CALCIUM +D3 PO) Take 1 tablet by mouth 3 (three) times a week.    [provider]  celecoxib  (CELEBREX ) 200 MG  capsule Take 1 capsule (200 mg total) by mouth 2 (two) times daily. Patient not taking: Reported on 02/08/2024 01/01/24   Gladis Mustard, FNP  dicyclomine  (BENTYL ) 10 MG capsule Take 1 capsule (10 mg total) by mouth 4 (four) times daily as needed for spasms. 12/11/23   Gladis Mary-Margaret, FNP  diphenhydrAMINE  (BENADRYL ) 25 MG tablet Take 25 mg by mouth in the morning and at bedtime.    [provider]  doxylamine, Sleep, (UNISOM) 25 MG tablet Take 25 mg by mouth at bedtime as needed for sleep.    [provider]  fluticasone  (FLONASE ) 50 MCG/ACT nasal spray SPRAY 2 SPRAYS INTO EACH NOSTRIL EVERY DAY 06/05/23   Gladis Mustard, FNP  Homeopathic Products (LEG CRAMPS) TABS Take 1-2 tablets by mouth daily as needed (leg cramps).    [provider]  ipratropium (ATROVENT ) 0.02 % nebulizer solution USE 1 VIAL BY NEBULIZATION EVERY 6 (SIX) HOURS AS NEEDED FOR WHEEZING OR SHORTNESS OF BREATH. 04/14/23   Gladis, Mary-Margaret, FNP  pantoprazole  (PROTONIX ) 40 MG tablet Take 1 tablet (40 mg total) by mouth 2 (two) times daily. 12/11/23   Gladis Mary-Margaret, FNP  PARoxetine  (PAXIL ) 40 MG tablet TAKE 1 TABLET BY MOUTH EVERY DAY IN THE MORNING 12/11/23   Gladis Mustard, FNP  Phenazopyridine HCl (AZO DINE PO) Take 1-2 tablets by mouth 3 (three) times daily as needed (urinary pain/discomfort).    [provider]  Polyethyl Glycol-Propyl Glycol (LUBRICANT EYE DROPS) 0.4-0.3 % SOLN Place 1-2 drops into both eyes 3 (three) times daily  as needed (dry/irritated eyes.).    [provider]  SYMBICORT  80-4.5 MCG/ACT inhaler Take 2 puffs first thing in am and then another 2 puffs about 12 hours later. 12/11/23   Gladis Mary-Margaret, FNP  valsartan -hydrochlorothiazide  (DIOVAN -HCT) 160-12.5 MG tablet Take 1 tablet by mouth daily. 12/11/23   Gladis Mary-Margaret, FNP    Allergies: Meloxicam    Review of Systems  Respiratory:  Positive for shortness of breath.    Neurological:  Positive for dizziness.  All other systems reviewed and are negative.   Updated Vital Signs BP (!) 169/84   Pulse 82   Temp 98.7 F (37.1 C) (Oral)   Resp 20   Ht 1.524 m (5')   Wt 72.6 kg   SpO2 92%   BMI 31.25 kg/m   Physical Exam Vitals and nursing note reviewed.  Constitutional:      General: She is not in acute distress.    Appearance: She is well-developed.  HENT:     Head: Normocephalic and atraumatic.     Mouth/Throat:     Mouth: Mucous membranes are moist.     Pharynx: No oropharyngeal exudate.  Eyes:     General: No scleral icterus.       Right eye: No discharge.        Left eye: No discharge.     Conjunctiva/sclera: Conjunctivae normal.     Pupils: Pupils are equal, round, and reactive to light.  Neck:     Thyroid : No thyromegaly.     Vascular: No JVD.  Cardiovascular:     Rate and Rhythm: Normal rate and regular rhythm.     Heart sounds: Normal heart sounds. No murmur heard.    No friction rub. No gallop.  Pulmonary:     Effort: Pulmonary effort is normal. No respiratory distress.     Breath sounds: Wheezing present. No rales.  Abdominal:     General: Bowel sounds are normal. There is no distension.     Palpations: Abdomen is soft. There is no mass.     Tenderness: There is no abdominal tenderness.  Musculoskeletal:        General: No tenderness. Normal range of motion.     Cervical back: Normal range of motion and neck supple.  Lymphadenopathy:     Cervical: No cervical adenopathy.  Skin:    General: Skin is warm and dry.     Findings: No erythema or rash.  Neurological:     Mental Status: She is alert.     Coordination: Coordination normal.     Comments: Speech is clear, cranial nerves III through XII are intact, memory is intact, strength is normal in all 4 extremities including grips and strength at the bilateral thighs, knees and ankles to extention and flexion, sensation is intact to light touch and pinprick in all 4  extremities. Coordination as tested by finger-nose-finger is normal, no limb ataxia. Normal gait, normal reflexes at the patellar tendons bilaterally  Psychiatric:        Behavior: Behavior normal.     (all labs ordered are listed, but only abnormal results are displayed) Labs Reviewed  BASIC METABOLIC PANEL WITH GFR - Abnormal; Notable for the following components:      Result Value   Potassium 3.2 (*)    Glucose, Bld 113 (*)    All other components within normal limits  HEPATIC FUNCTION PANEL - Abnormal; Notable for the following components:   Total Protein 6.3 (*)    All other components  within normal limits  CBC  URINALYSIS, ROUTINE W REFLEX MICROSCOPIC  MAGNESIUM     EKG: EKG Interpretation Date/Time:  Thursday May 09 2024 20:48:56 EDT Ventricular Rate:  93 PR Interval:  247 QRS Duration:  92 QT Interval:  355 QTC Calculation: 442 R Axis:   -68  Text Interpretation: Sinus rhythm Prolonged PR interval Right atrial enlargement Left anterior fascicular block Consider anterior infarct since last tracing no significant change Confirmed by Cleotilde Rogue ([id number]) on 05/09/2024 9:22:06 PM  Radiology: [facility] Abd 2 Views Result Date: 05/09/2024 CLINICAL DATA:  Abdominal distension EXAM: ABDOMEN - 2 VIEW COMPARISON:  None Available. FINDINGS: Two supine frontal views of the abdomen and pelvis are obtained. Bowel gas pattern is unremarkable without obstruction or ileus. No significant fecal retention. No masses or abnormal calcifications. Lung bases are clear. IMPRESSION: 1. Unremarkable bowel gas pattern. Electronically Signed   By: Ozell Daring M.D.   On: 05/09/2024 22:13   DG Chest Portable 1 View Result Date: 05/09/2024 CLINICAL DATA:  Short of breath, COPD EXAM: PORTABLE CHEST 1 VIEW COMPARISON:  12/11/2023 FINDINGS: 2 frontal views of the chest demonstrate an unremarkable cardiac silhouette. No acute airspace disease, effusion, or pneumothorax. No acute bony abnormalities. IMPRESSION:  1. No acute intrathoracic process. Electronically Signed   By: Ozell Daring M.D.   On: 05/09/2024 21:07     Procedures   Medications Ordered in the ED  albuterol  (VENTOLIN  HFA) 108 (90 Base) MCG/ACT inhaler 2 puff (has no administration in time range)  dexamethasone  (DECADRON ) injection 10 mg (has no administration in time range)                                    Medical Decision Making Amount and/or Complexity of Data Reviewed Labs: ordered. Radiology: ordered.  Risk Prescription drug management.    This patient presents to the ED for concern of lightheadedness differential diagnosis includes medication reactions dehydration hyperglycemia urinary tract infection chronic anxiety    Additional history obtained   Additional history obtained from Electronic Medical Record External records from outside source obtained and reviewed including medical record including prior visits for overdose and psychiatric hospitalizations   Lab Tests:  I Ordered, and personally interpreted labs.  The pertinent results include: Metabolic panel with mild hypokalemia but preserved renal and liver function, magnesium  is normal, urine is clean, CBC without leukocytosis   Imaging Studies ordered:  I ordered imaging studies including x-ray of the chest and abdomen shows no signs of bowel obstruction nor is there any signs of pneumonia or pulmonary edema I independently visualized and interpreted imaging which showed as above I agree with the radiologist interpretation   Medicines ordered and prescription drug management:  I ordered medication including Decadron     I have reviewed the patients home medicines and have made adjustments as needed   Problem List / ED Course:  Patient given a shot of Decadron  for the sciatica   Social Determinants of Health:  Chronic pain  I have discussed with the patient at the bedside the results, and the meaning of these results.  They have had  opportunity to ask questions,  expressed their understanding to the need for follow-up with primary care physician        Final diagnoses:  Sciatica of right side  Light headed    ED Discharge Orders     None  Cleotilde Rogue, MD 05/09/24 2312

## 2024-05-09 NOTE — ED Notes (Signed)
 Pt ambulated to bathroom to void independently, tolerated well.

## 2024-05-09 NOTE — Discharge Instructions (Addendum)
 Your symptoms are consistent with having sciatica, thankfully all the rest of your test have been reassuring.  We have given you a steroid shot which should help with some of the inflammation causing the sciatic nerve pain but she will most likely need to follow-up with your doctor, physical therapy, weight loss and possibly seeing Dr. Mavis again for other discussion about possibilities of therapy.  Thank you for allowing us  to treat you in the emergency department today.  After reviewing your examination and potential testing that was done it appears that you are safe to go home.  I would like for you to follow-up with your doctor within the next several days, have them obtain your records and follow-up with them to review all potential tests and results from your visit.  If you should develop severe or worsening symptoms return to the emergency department immediately

## 2024-05-13 ENCOUNTER — Ambulatory Visit (INDEPENDENT_AMBULATORY_CARE_PROVIDER_SITE_OTHER): Admitting: Nurse Practitioner

## 2024-05-13 ENCOUNTER — Encounter: Payer: Self-pay | Admitting: Nurse Practitioner

## 2024-05-13 VITALS — BP 127/83 | HR 94 | Temp 98.2°F | Ht 60.0 in | Wt 169.0 lb

## 2024-05-13 DIAGNOSIS — I10 Essential (primary) hypertension: Secondary | ICD-10-CM

## 2024-05-13 DIAGNOSIS — J449 Chronic obstructive pulmonary disease, unspecified: Secondary | ICD-10-CM

## 2024-05-13 DIAGNOSIS — K219 Gastro-esophageal reflux disease without esophagitis: Secondary | ICD-10-CM

## 2024-05-13 DIAGNOSIS — J4489 Other specified chronic obstructive pulmonary disease: Secondary | ICD-10-CM

## 2024-05-13 DIAGNOSIS — F3342 Major depressive disorder, recurrent, in full remission: Secondary | ICD-10-CM

## 2024-05-13 DIAGNOSIS — F419 Anxiety disorder, unspecified: Secondary | ICD-10-CM

## 2024-05-13 DIAGNOSIS — K580 Irritable bowel syndrome with diarrhea: Secondary | ICD-10-CM | POA: Diagnosis not present

## 2024-05-13 DIAGNOSIS — Z6832 Body mass index (BMI) 32.0-32.9, adult: Secondary | ICD-10-CM

## 2024-05-13 DIAGNOSIS — E785 Hyperlipidemia, unspecified: Secondary | ICD-10-CM

## 2024-05-13 DIAGNOSIS — F17210 Nicotine dependence, cigarettes, uncomplicated: Secondary | ICD-10-CM

## 2024-05-13 DIAGNOSIS — E876 Hypokalemia: Secondary | ICD-10-CM

## 2024-05-13 MED ORDER — SYMBICORT 80-4.5 MCG/ACT IN AERO: INHALATION_SPRAY | RESPIRATORY_TRACT | 12 refills | Status: AC

## 2024-05-13 MED ORDER — HYDROXYZINE PAMOATE 25 MG PO CAPS: 25.0000 mg | ORAL_CAPSULE | Freq: Three times a day (TID) | ORAL | 1 refills | Status: DC | PRN

## 2024-05-13 MED ORDER — PANTOPRAZOLE SODIUM 40 MG PO TBEC
40.0000 mg | DELAYED_RELEASE_TABLET | Freq: Two times a day (BID) | ORAL | 1 refills | Status: AC
Start: 2024-05-13 — End: ?

## 2024-05-13 MED ORDER — VALSARTAN-HYDROCHLOROTHIAZIDE 160-12.5 MG PO TABS: 1.0000 | ORAL_TABLET | Freq: Every day | ORAL | 1 refills | Status: AC

## 2024-05-13 MED ORDER — IPRATROPIUM BROMIDE 0.02 % IN SOLN: RESPIRATORY_TRACT | 1 refills | Status: DC

## 2024-05-13 MED ORDER — PAROXETINE HCL 40 MG PO TABS: ORAL_TABLET | ORAL | 1 refills | Status: AC

## 2024-05-13 NOTE — Patient Instructions (Signed)

## 2024-05-13 NOTE — Progress Notes (Signed)
 Subjective:    Patient ID: [name], female    DOB: 03/11/1946, 78 y.o.   MRN: [id number]   Chief Complaint: medical management of chronic issues     HPI:  [name] is a 78 y.o. who identifies as a female who was assigned female at birth.   Social history: Lives with: by herself Work history: retired   Water engineer in today for follow up of the following chronic medical issues:  1. Benign hypertension No c/o chest pain, sob or headache. Doe snot check blood pressure at home BP Readings from Last 3 Encounters:  05/09/24 (!) 152/74  02/08/24 (!) 149/87  01/11/24 (!) 147/88     2. Hyperlipidemia with target LDL less than 100 Does not watch diet and does no dedicated exercise. Refuses statin Lab Results  Component Value Date   CHOL 194 05/23/2023   HDL 76 05/23/2023   LDLCALC 100 (H) 05/23/2023   TRIG 102 05/23/2023   CHOLHDL 2.6 05/23/2023     3. Gastroesophageal reflux disease, unspecified whether esophagitis present Is on protonix  and is doing well.  4. Irritable bowel syndrome with diarrhea Is on bentyl  and is doing well. Has occasional diarrhea  5. Anxiety We cannot prescribe xanax  because she had OD episode.    02/08/2024    1:54 PM 12/11/2023    2:31 PM 09/05/2023    2:03 PM 05/23/2023    2:07 PM  GAD 7 : Generalized Anxiety Score  Nervous, Anxious, on Edge 1 1 2 2   Control/stop worrying 0 1 1 2   Worry too much - different things 0 1 1 1   Trouble relaxing 0 1 1 1   Restless 0 1 0 1  Easily annoyed or irritable 0 1 0 1  Afraid - awful might happen 0 1 0 1  Total GAD 7 Score 1 7 5 9   Anxiety Difficulty Not difficult at all Somewhat difficult Not difficult at all Somewhat difficult      6. Recurrent major depressive disorder, in full remission (HCC) Is on paxil  and is doing well.    02/08/2024    1:54 PM 01/11/2024   12:01 PM 01/01/2024   12:09 PM  Depression screen PHQ 2/9  Decreased Interest 1 0 0  Down, Depressed, Hopeless 1 0 0   PHQ - 2 Score 2 0 0  Altered sleeping 1    Tired, decreased energy 1    Change in appetite 0    Feeling bad or failure about yourself  0    Trouble concentrating 0    Moving slowly or fidgety/restless 0    Suicidal thoughts 0    PHQ-9 Score 4    Difficult doing work/chores Somewhat difficult       7. Hypokalemia Denies muscle cramps Lab Results  Component Value Date   K 3.2 (L) 05/09/2024     8. BMI 32.0-32.9,adult No recent weight changes  Wt Readings from Last 3 Encounters:  05/13/24 169 lb (76.7 kg)  05/09/24 160 lb (72.6 kg)  02/08/24 165 lb (74.8 kg)   BMI Readings from Last 3 Encounters:  05/13/24 33.01 kg/m  05/09/24 31.25 kg/m  02/08/24 32.22 kg/m      9. Chronic midline low back pain without sciatica Patient had OD episodes and we are no longer prescribing her pain medication.   10. Cigarette smoker Still smoking over a pack a day   New complaints: None today  Allergies  Allergen Reactions   Meloxicam Other (See Comments)  Stomach ulcers   Outpatient Encounter Medications as of 05/13/2024  Medication Sig   albuterol  (VENTOLIN  HFA) 108 (90 Base) MCG/ACT inhaler INHALE 2 PUFFS INTO THE LUNGS EVERY 6 HOURS AS NEEDED.   Aspirin-Acetaminophen -Caffeine (GOODYS EXTRA STRENGTH) 500-325-65 MG PACK Take 1 packet by mouth 3 (three) times daily as needed (pain.).   Calcium  Carb-Cholecalciferol  (CALCIUM +D3 PO) Take 1 tablet by mouth 3 (three) times a week.   celecoxib  (CELEBREX ) 200 MG capsule Take 1 capsule (200 mg total) by mouth 2 (two) times daily. (Patient not taking: Reported on 02/08/2024)   dicyclomine  (BENTYL ) 10 MG capsule Take 1 capsule (10 mg total) by mouth 4 (four) times daily as needed for spasms.   diphenhydrAMINE  (BENADRYL ) 25 MG tablet Take 25 mg by mouth in the morning and at bedtime.   doxylamine, Sleep, (UNISOM) 25 MG tablet Take 25 mg by mouth at bedtime as needed for sleep.   fluticasone  (FLONASE ) 50 MCG/ACT nasal spray SPRAY 2  SPRAYS INTO EACH NOSTRIL EVERY DAY   Homeopathic Products (LEG CRAMPS) TABS Take 1-2 tablets by mouth daily as needed (leg cramps).   ipratropium (ATROVENT ) 0.02 % nebulizer solution USE 1 VIAL BY NEBULIZATION EVERY 6 (SIX) HOURS AS NEEDED FOR WHEEZING OR SHORTNESS OF BREATH.   pantoprazole  (PROTONIX ) 40 MG tablet Take 1 tablet (40 mg total) by mouth 2 (two) times daily.   PARoxetine  (PAXIL ) 40 MG tablet TAKE 1 TABLET BY MOUTH EVERY DAY IN THE MORNING   Phenazopyridine HCl (AZO DINE PO) Take 1-2 tablets by mouth 3 (three) times daily as needed (urinary pain/discomfort).   Polyethyl Glycol-Propyl Glycol (LUBRICANT EYE DROPS) 0.4-0.3 % SOLN Place 1-2 drops into both eyes 3 (three) times daily as needed (dry/irritated eyes.).   SYMBICORT  80-4.5 MCG/ACT inhaler Take 2 puffs first thing in am and then another 2 puffs about 12 hours later.   valsartan -hydrochlorothiazide  (DIOVAN -HCT) 160-12.5 MG tablet Take 1 tablet by mouth daily.   Facility-Administered Encounter Medications as of 05/13/2024  Medication   acyclovir  ointment (ZOVIRAX ) 5 %    Past Surgical History:  Procedure Laterality Date   APPENDECTOMY     78 years old   BACK SURGERY     X 2   BIOPSY  08/11/2016   Procedure: BIOPSY;  Surgeon: Lamar CHRISTELLA Hollingshead, MD;  Location: [facility];  Service: Endoscopy;;  gastric bx's   BIOPSY  07/01/2021   Procedure: BIOPSY;  Surgeon: Hollingshead Lamar CHRISTELLA, MD;  Location: [facility];  Service: Endoscopy;;   BREAST SURGERY     biopsy only   CARPAL TUNNEL RELEASE     right hand   CERVICAL BIOPSY  W/ LOOP ELECTRODE EXCISION  02/2011   CIN-2 with focal ectocervical margin involvement. With CIN-1 ECC negative   CHOLECYSTECTOMY  1984   COLONOSCOPY WITH PROPOFOL  N/A 07/01/2021   Procedure: COLONOSCOPY WITH PROPOFOL ;  Surgeon: Hollingshead Lamar CHRISTELLA, MD;  Location: [facility];  Service: Endoscopy;  Laterality: N/A;  1:00pm   ESOPHAGOGASTRODUODENOSCOPY  12/01/2011   Hiatal hernia/Mild fibrotic pyloric stenosis  status post dilation with passage of  the scope.  The previously noted gastric ulcer completely healed  The remainder of the gastric mucosa appeared normal   ESOPHAGOGASTRODUODENOSCOPY (EGD) WITH PROPOFOL  N/A 08/11/2016   Procedure: ESOPHAGOGASTRODUODENOSCOPY (EGD) WITH PROPOFOL ;  Surgeon: Lamar CHRISTELLA Hollingshead, MD;  Location: [facility];  Service: Endoscopy;  Laterality: N/A;  2:15 PM   ESOPHAGOGASTRODUODENOSCOPY (EGD) WITH PROPOFOL  N/A 07/01/2021   Procedure: ESOPHAGOGASTRODUODENOSCOPY (EGD) WITH PROPOFOL ;  Surgeon: Hollingshead,  Lamar HERO, MD;  Location: [facility];  Service: Endoscopy;  Laterality: N/A;   FRACTURE SURGERY  10/18/2016   left knee in 3 places   MALONEY DILATION  07/28/2011   Schatzki's ring s/p 46F with small UES tear as well, small hh, 1cm pyloric channel ulcer, bx benign without H.Pylori. Mobic/goody's at time.   MALONEY DILATION N/A 08/11/2016   Procedure: AGAPITO DILATION;  Surgeon: Lamar HERO Hollingshead, MD;  Location: [facility];  Service: Endoscopy;  Laterality: N/A;   MALONEY DILATION N/A 07/01/2021   Procedure: AGAPITO DILATION;  Surgeon: Hollingshead Lamar HERO, MD;  Location: [facility];  Service: Endoscopy;  Laterality: N/A;   ORIF PATELLA Left 10/19/2016   Procedure: OPEN REDUCTION INTERNAL (ORIF) FIXATION PATELLA;  Surgeon: Taft FORBES Minerva, MD;  Location: [facility];  Service: Orthopedics;  Laterality: Left;   POLYPECTOMY  07/01/2021   Procedure: POLYPECTOMY;  Surgeon: Hollingshead Lamar HERO, MD;  Location: [facility];  Service: Endoscopy;;   SPINE SURGERY  2009   cervical disc   SPINE SURGERY  2009   lumbar   TUBAL LIGATION  1976   tumor removed from arm      Family History  Problem Relation Age of Onset   Heart disease Father        CHF   Heart disease Mother    COPD Sister    Diabetes Sister    Neuropathy Sister    COPD Sister    COPD Sister    Cancer Sister    Stroke Brother    Heart disease Brother    Colon cancer Neg Hx    Anesthesia problems Neg Hx    Hypotension  Neg Hx    Malignant hyperthermia Neg Hx    Pseudochol deficiency Neg Hx    Colon polyps Neg Hx       Controlled substance contract: n/a     Review of Systems  Constitutional:  Negative for diaphoresis.  Eyes:  Negative for pain.  Respiratory:  Negative for shortness of breath.   Cardiovascular:  Negative for chest pain, palpitations and leg swelling.  Gastrointestinal:  Negative for abdominal pain.  Endocrine: Negative for polydipsia.  Skin:  Negative for rash.  Neurological:  Negative for dizziness, weakness and headaches.  Hematological:  Does not bruise/bleed easily.  All other systems reviewed and are negative.      Objective:   Physical Exam Vitals and nursing note reviewed.  Constitutional:      General: She is not in acute distress.    Appearance: Normal appearance. She is well-developed.  HENT:     Head: Normocephalic.     Right Ear: Tympanic membrane normal.     Left Ear: Tympanic membrane normal.     Nose: Nose normal.     Mouth/Throat:     Mouth: Mucous membranes are moist.  Eyes:     Pupils: Pupils are equal, round, and reactive to light.  Neck:     Vascular: No carotid bruit or JVD.  Cardiovascular:     Rate and Rhythm: Normal rate and regular rhythm.     Heart sounds: Normal heart sounds.  Pulmonary:     Effort: Pulmonary effort is normal. No respiratory distress.     Breath sounds: Wheezing (exp wheezes throughout) present. No rales.  Chest:     Chest wall: No tenderness.  Abdominal:     General: Bowel sounds are normal. There is no distension or abdominal bruit.     Palpations: Abdomen  is soft. There is no hepatomegaly, splenomegaly, mass or pulsatile mass.     Tenderness: There is no abdominal tenderness.  Musculoskeletal:        General: Normal range of motion.     Cervical back: Normal range of motion and neck supple.  Lymphadenopathy:     Cervical: No cervical adenopathy.  Skin:    General: Skin is warm and dry.  Neurological:      Mental Status: She is alert and oriented to person, place, and time.     Deep Tendon Reflexes: Reflexes are normal and symmetric.  Psychiatric:        Behavior: Behavior normal.        Thought Content: Thought content normal.        Judgment: Judgment normal.     BP 127/83   Pulse 94   Temp 98.2 F (36.8 C)   Ht 5' (1.524 m)   Wt 169 lb (76.7 kg)   SpO2 91%   BMI 33.01 kg/m         Assessment & Plan:   [name] comes in today with chief complaint of medical management of chronic issues    Diagnosis and orders addressed:  1. Benign hypertension Low sodium diet - valsartan -hydrochlorothiazide  (DIOVAN -HCT) 160-12.5 MG tablet; Take 1 tablet by mouth daily.  Dispense: 90 tablet; Refill: 1 - CBC with Differential/Platelet - CMP14+EGFR  2. Hyperlipidemia with target LDL less than 100 Low fat diet - Lipid panel  3. Gastroesophageal reflux disease, unspecified whether esophagitis present Avoid spicy foods Do not eat 2 hours prior to bedtime - pantoprazole  (PROTONIX ) 40 MG tablet; Take 1 tablet (40 mg total) by mouth 2 (two) times daily.  Dispense: 180 tablet; Refill: 1  4. Irritable bowel syndrome with diarrhea - dicyclomine  (BENTYL ) 10 MG capsule; Take 1 capsule (10 mg total) by mouth 4 (four) times daily as needed for spasms.  Dispense: 360 capsule; Refill: 1  5. Anxiety Stress management Hydroxazine 25 mg; Take 1 tablet (25 mg total) by mouth 2 (two) times daily.  Dispense: 60 tablet; Refill: 2  6. Recurrent major depressive disorder, in full remission (HCC) - PARoxetine  (PAXIL ) 40 MG tablet; TAKE 1 TABLET BY MOUTH EVERY DAY IN THE MORNING  Dispense: 90 tablet; Refill: 1  7. Hypokalemia Labs pending  8. BMI 32.0-32.9,adult Discussed diet and exercise for person with BMI >25 Will recheck weight in 3-6 months   9. Chronic midline low back pain without sciatica Moist heat rest   10. Cigarette smoker Encouraged to stop smoking  11. COPD GOLD ? /  active smoker   - SYMBICORT  80-4.5 MCG/ACT inhaler; Take 2 puffs first thing in am and then another 2 puffs about 12 hours later.  Dispense: 1 each; Refill: 12   Labs pending Health Maintenance reviewed Diet and exercise encouraged  Follow up plan: 6 months   Mary-Margaret Gladis, FNP

## 2024-05-14 ENCOUNTER — Ambulatory Visit: Payer: Self-pay | Admitting: Nurse Practitioner

## 2024-05-14 LAB — CBC WITH DIFFERENTIAL/PLATELET
Basophils Absolute: 0.1 x10E3/uL (ref 0.0–0.2)
Basos: 1 %
EOS (ABSOLUTE): 0.2 x10E3/uL (ref 0.0–0.4)
Eos: 2 %
Hematocrit: 42.5 % (ref 34.0–46.6)
Hemoglobin: 13.6 g/dL (ref 11.1–15.9)
Immature Grans (Abs): 0.1 x10E3/uL (ref 0.0–0.1)
Immature Granulocytes: 1 %
Lymphocytes Absolute: 2.9 x10E3/uL (ref 0.7–3.1)
Lymphs: 32 %
MCH: 30.8 pg (ref 26.6–33.0)
MCHC: 32 g/dL (ref 31.5–35.7)
MCV: 96 fL (ref 79–97)
Monocytes Absolute: 0.7 x10E3/uL (ref 0.1–0.9)
Monocytes: 8 %
Neutrophils Absolute: 5 x10E3/uL (ref 1.4–7.0)
Neutrophils: 56 %
Platelets: 317 x10E3/uL (ref 150–450)
RBC: 4.41 x10E6/uL (ref 3.77–5.28)
RDW: 12.9 % (ref 11.7–15.4)
WBC: 8.9 x10E3/uL (ref 3.4–10.8)

## 2024-05-14 LAB — CMP14+EGFR
ALT: 19 IU/L (ref 0–32)
AST: 25 IU/L (ref 0–40)
Albumin: 4.2 g/dL (ref 3.8–4.8)
Alkaline Phosphatase: 68 IU/L (ref 44–121)
BUN/Creatinine Ratio: 16 (ref 12–28)
BUN: 9 mg/dL (ref 8–27)
Bilirubin Total: 0.3 mg/dL (ref 0.0–1.2)
CO2: 26 mmol/L (ref 20–29)
Calcium: 9.8 mg/dL (ref 8.7–10.3)
Chloride: 100 mmol/L (ref 96–106)
Creatinine, Ser: 0.56 mg/dL — ABNORMAL LOW (ref 0.57–1.00)
Globulin, Total: 2.1 g/dL (ref 1.5–4.5)
Glucose: 91 mg/dL (ref 70–99)
Potassium: 4.3 mmol/L (ref 3.5–5.2)
Sodium: 141 mmol/L (ref 134–144)
Total Protein: 6.3 g/dL (ref 6.0–8.5)
eGFR: 95 mL/min/1.73 (ref >59)

## 2024-05-14 LAB — LIPID PANEL
Chol/HDL Ratio: 2.4 ratio (ref 0.0–4.4)
Cholesterol, Total: 177 mg/dL (ref 100–199)
HDL: 75 mg/dL (ref >39)
LDL Chol Calc (NIH): 76 mg/dL (ref 0–99)
Triglycerides: 155 mg/dL — ABNORMAL HIGH (ref 0–149)
VLDL Cholesterol Cal: 26 mg/dL (ref 5–40)

## 2024-05-23 DIAGNOSIS — J441 Chronic obstructive pulmonary disease with (acute) exacerbation: Secondary | ICD-10-CM | POA: Diagnosis not present

## 2024-06-03 ENCOUNTER — Other Ambulatory Visit: Payer: Self-pay | Admitting: Nurse Practitioner

## 2024-06-03 DIAGNOSIS — M5431 Sciatica, right side: Secondary | ICD-10-CM

## 2024-06-12 ENCOUNTER — Encounter (INDEPENDENT_AMBULATORY_CARE_PROVIDER_SITE_OTHER): Payer: Self-pay | Admitting: *Deleted

## 2024-06-23 DIAGNOSIS — J441 Chronic obstructive pulmonary disease with (acute) exacerbation: Secondary | ICD-10-CM | POA: Diagnosis not present

## 2024-07-03 ENCOUNTER — Telehealth: Payer: Self-pay

## 2024-07-03 ENCOUNTER — Other Ambulatory Visit: Payer: Self-pay | Admitting: Nurse Practitioner

## 2024-07-03 DIAGNOSIS — J449 Chronic obstructive pulmonary disease, unspecified: Secondary | ICD-10-CM

## 2024-07-03 NOTE — Telephone Encounter (Signed)
 Copied from CRM #[id number]. Topic: Clinical - Medication Question >> Jul 03, 2024 11:46 AM Willma SAUNDERS wrote: Reason for CRM: Patient is currently taking dicyclomine  (BENTYL ) 10 MG capsule, hydrOXYzine  (VISTARIL ) 25 MG capsule, PARoxetine  (PAXIL ) 40 MG tablet and HYDROcodone -acetaminophen  (NORCO) 10-325 MG tablet(wants to verify if discontinued) , which causes falls, confusion, and CNS depression, they are requesting to see if the patient can be prescribed something else or stopped.  Hassina can be reached at [phone]

## 2024-07-23 DIAGNOSIS — J441 Chronic obstructive pulmonary disease with (acute) exacerbation: Secondary | ICD-10-CM | POA: Diagnosis not present

## 2024-08-22 ENCOUNTER — Other Ambulatory Visit: Payer: Self-pay | Admitting: Nurse Practitioner

## 2024-08-22 DIAGNOSIS — J4489 Other specified chronic obstructive pulmonary disease: Secondary | ICD-10-CM

## 2024-10-17 ENCOUNTER — Other Ambulatory Visit: Payer: Self-pay | Admitting: Nurse Practitioner

## 2024-11-11 ENCOUNTER — Ambulatory Visit: Payer: Self-pay | Admitting: Nurse Practitioner
